# Patient Record
Sex: Female | Born: 1945 | Race: White | Hispanic: No | Marital: Married | Smoking: Former smoker
Health system: Southern US, Community
[De-identification: ages and names within clinical notes are randomized; demographics above are authoritative.]

## PROBLEM LIST (undated history)

## (undated) DIAGNOSIS — Z923 Personal history of irradiation: Secondary | ICD-10-CM

## (undated) DIAGNOSIS — R42 Dizziness and giddiness: Secondary | ICD-10-CM

## (undated) DIAGNOSIS — R55 Syncope and collapse: Secondary | ICD-10-CM

## (undated) DIAGNOSIS — Z9889 Other specified postprocedural states: Secondary | ICD-10-CM

## (undated) DIAGNOSIS — E785 Hyperlipidemia, unspecified: Secondary | ICD-10-CM

## (undated) DIAGNOSIS — I10 Essential (primary) hypertension: Secondary | ICD-10-CM

## (undated) DIAGNOSIS — M1990 Unspecified osteoarthritis, unspecified site: Secondary | ICD-10-CM

## (undated) DIAGNOSIS — C801 Malignant (primary) neoplasm, unspecified: Secondary | ICD-10-CM

## (undated) DIAGNOSIS — R112 Nausea with vomiting, unspecified: Secondary | ICD-10-CM

## (undated) DIAGNOSIS — K219 Gastro-esophageal reflux disease without esophagitis: Secondary | ICD-10-CM

## (undated) HISTORY — PX: TONSILLECTOMY: SUR1361

## (undated) HISTORY — PX: APPENDECTOMY: SHX54

## (undated) HISTORY — PX: IUD REMOVAL: SHX5392

## (undated) HISTORY — PX: DILATION AND CURETTAGE OF UTERUS: SHX78

## (undated) HISTORY — DX: Unspecified osteoarthritis, unspecified site: M19.90

## (undated) HISTORY — DX: Essential (primary) hypertension: I10

## (undated) HISTORY — PX: CYST REMOVAL NECK: SHX6281

## (undated) HISTORY — PX: INTRAUTERINE DEVICE INSERTION: SHX323

## (undated) HISTORY — DX: Hyperlipidemia, unspecified: E78.5

## (undated) HISTORY — DX: Gastro-esophageal reflux disease without esophagitis: K21.9

---

## 1997-10-10 ENCOUNTER — Encounter: Admission: RE | Admit: 1997-10-10 | Discharge: 1998-01-08 | Payer: Self-pay | Admitting: *Deleted

## 1998-03-18 HISTORY — PX: BUNIONECTOMY: SHX129

## 1998-11-06 ENCOUNTER — Ambulatory Visit (HOSPITAL_COMMUNITY): Admission: RE | Admit: 1998-11-06 | Discharge: 1998-11-06 | Payer: Self-pay | Admitting: *Deleted

## 1999-03-07 ENCOUNTER — Other Ambulatory Visit: Admission: RE | Admit: 1999-03-07 | Discharge: 1999-03-07 | Payer: Self-pay | Admitting: *Deleted

## 2000-08-20 ENCOUNTER — Other Ambulatory Visit: Admission: RE | Admit: 2000-08-20 | Discharge: 2000-08-20 | Payer: Self-pay | Admitting: *Deleted

## 2000-08-25 ENCOUNTER — Encounter: Admission: RE | Admit: 2000-08-25 | Discharge: 2000-08-25 | Payer: Self-pay | Admitting: *Deleted

## 2000-08-25 ENCOUNTER — Encounter: Payer: Self-pay | Admitting: *Deleted

## 2002-06-08 ENCOUNTER — Other Ambulatory Visit: Admission: RE | Admit: 2002-06-08 | Discharge: 2002-06-08 | Payer: Self-pay | Admitting: *Deleted

## 2003-06-13 ENCOUNTER — Other Ambulatory Visit: Admission: RE | Admit: 2003-06-13 | Discharge: 2003-06-13 | Payer: Self-pay | Admitting: Family Medicine

## 2004-06-25 ENCOUNTER — Other Ambulatory Visit: Admission: RE | Admit: 2004-06-25 | Discharge: 2004-06-25 | Payer: Self-pay | Admitting: Family Medicine

## 2006-01-09 ENCOUNTER — Other Ambulatory Visit: Admission: RE | Admit: 2006-01-09 | Discharge: 2006-01-09 | Payer: Self-pay | Admitting: Family Medicine

## 2006-05-05 ENCOUNTER — Ambulatory Visit: Payer: Self-pay | Admitting: Vascular Surgery

## 2007-12-28 ENCOUNTER — Other Ambulatory Visit: Admission: RE | Admit: 2007-12-28 | Discharge: 2007-12-28 | Payer: Self-pay | Admitting: Family Medicine

## 2008-03-22 ENCOUNTER — Ambulatory Visit: Payer: Self-pay | Admitting: Internal Medicine

## 2008-04-05 ENCOUNTER — Ambulatory Visit: Payer: Self-pay | Admitting: Internal Medicine

## 2009-03-18 HISTORY — PX: OTHER SURGICAL HISTORY: SHX169

## 2009-08-16 ENCOUNTER — Ambulatory Visit: Payer: Self-pay | Admitting: Vascular Surgery

## 2009-12-19 ENCOUNTER — Ambulatory Visit: Payer: Self-pay | Admitting: Vascular Surgery

## 2010-02-12 ENCOUNTER — Ambulatory Visit (HOSPITAL_COMMUNITY): Admission: RE | Admit: 2010-02-12 | Discharge: 2010-02-12 | Payer: Self-pay | Admitting: Vascular Surgery

## 2010-02-12 ENCOUNTER — Ambulatory Visit: Payer: Self-pay | Admitting: Vascular Surgery

## 2010-02-27 ENCOUNTER — Ambulatory Visit: Payer: Self-pay | Admitting: Vascular Surgery

## 2010-05-29 ENCOUNTER — Ambulatory Visit (INDEPENDENT_AMBULATORY_CARE_PROVIDER_SITE_OTHER): Payer: Medicare Other | Admitting: Vascular Surgery

## 2010-05-29 DIAGNOSIS — I83893 Varicose veins of bilateral lower extremities with other complications: Secondary | ICD-10-CM

## 2010-05-29 LAB — SURGICAL PCR SCREEN
MRSA, PCR: NEGATIVE
Staphylococcus aureus: NEGATIVE

## 2010-05-29 LAB — POCT I-STAT 4, (NA,K, GLUC, HGB,HCT)
Glucose, Bld: 103 mg/dL — ABNORMAL HIGH (ref 70–99)
HCT: 42 % (ref 36.0–46.0)
Hemoglobin: 14.3 g/dL (ref 12.0–15.0)
Potassium: 4.2 mEq/L (ref 3.5–5.1)
Sodium: 138 mEq/L (ref 135–145)

## 2010-05-29 NOTE — Assessment & Plan Note (Signed)
OFFICE VISIT  Christina, [name] DOB:  Jan 31, 1946                                       05/29/2010 CHART#:[id number]  Patient presents today for follow-up after a ligation and stripping of her right great saphenous vein from the groin to just below the knee on February 12, 2010.  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  She is quite pleased with her result.  She reports that she has had no pain and minimal swelling since the procedure.  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  She has had marked resolution of these.  These are still slightly enlarged but are not causing her any symptoms.  I explained that she would be a candidate for tributary phlebectomy of these if she is having symptoms but certainly would not recommend that with currently no discomfort.  She is very happy with her result.  We will see her again on an as-needed basis.    [name], M.D. Electronically Signed  TFE/MEDQ  D:  05/29/2010  T:  05/29/2010  Job:  [id number]

## 2010-07-31 ENCOUNTER — Other Ambulatory Visit (HOSPITAL_COMMUNITY)
Admission: RE | Admit: 2010-07-31 | Discharge: 2010-07-31 | Disposition: A | Discharge status: Home / Self Care | Payer: Medicare Other | Source: Ambulatory Visit | Attending: Family Medicine | Admitting: Family Medicine

## 2010-07-31 ENCOUNTER — Other Ambulatory Visit: Payer: Self-pay | Admitting: Family Medicine

## 2010-07-31 DIAGNOSIS — Z124 Encounter for screening for malignant neoplasm of cervix: Secondary | ICD-10-CM | POA: Insufficient documentation

## 2010-07-31 NOTE — Assessment & Plan Note (Signed)
OFFICE VISIT   [name], [name]  DOB:  1945-12-03                                       12/19/2009  CHART#:[id number]   Patient presents today for continued discussion regarding the right leg  venous hypertension.  She has a long history of this and has had  increasing severe discomfort related to this, especially with prolonged  standing at the end of the day.  She does not have any history of DVT.  She was fitted with compression garments in June and has worn these but  reports that she continues to have difficulty with walking.  She walks 3  miles a day for exercise and has had to stop due to leg pain and  decrease in her frequency and duration of walking.  She also has  difficulty with prolonged sitting, especially travel in a car due to leg  pain and also pain wakes her from her sleep at night.  She elevates her  legs when possible and also takes ibuprofen for discomfort.   She had a prior venous duplex showing that her saphenous vein had gross  reflux throughout its course and was enlarged up to 2 cm in diameter.  I  had recommended surgical stripping of her great saphenous vein on the  right.  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  I did explain to her that Medicare does not cover stab  phlebectomy at the same setting as surgical stripping of the great  saphenous vein.  Therefore, we will proceed with this.  We will then  follow her to determine if she has had adequate symptom or would require  a staged phlebectomy.  We will schedule this at her earliest convenience  at [facility].     [name], M.D.  Electronically Signed   TFE/MEDQ  D:  12/19/2009  T:  12/20/2009  Job:  [id number]

## 2010-07-31 NOTE — Assessment & Plan Note (Signed)
OFFICE VISIT   [name], [name]  DOB:  09-Mar-1946                                       02/27/2010  CHART#:[id number]   Here today for follow-up of ligation and stripping of her right great  saphenous vein from her groin to just below the knee on 02/12/2010.  She  had an aneurysmal saphenous vein, making laser ablation in the office  not possible.  She has had an excellent early result with mild bruising  and minimal discomfort.  She reports that she is having less distention  feeling and less swelling in her right calf than before.   She continues to have tributaries varicosities below this level.  These  appear to be somewhat less pressurized than before.  She will wear her  compression garments should these cause difficulty, and we will see her  again in 3 months for final follow-up.   She does report that she is having worsening right shoulder symptoms.  She reports this is stiff and painful the morning when she first awakens  and is relieved with activity.  I explained that this sounds quite  typical for arthritis.  She is encouraged to take ibuprofen for this  discomfort and will notify her medical doctor should this persist.     [name], M.D.  Electronically Signed   TFE/MEDQ  D:  02/27/2010  T:  02/28/2010  Job:  [id number]   cc:   Gretta Arab. Valentina Lucks, M.D.

## 2010-07-31 NOTE — Consult Note (Signed)
NEW PATIENT CONSULTATION   [name], DESROSIERS  DOB:  Dec 14, 1945                                       08/16/2009  CHART#:[id number]   The patient presents today for continued evaluation of venous  hypertension in her right leg.  I know the patient from prior evaluation  in late 2007 and early 2008.  She is a very pleasant 65 year old white  female with progressive changes of venous hypertension in her right leg.  She reports that she is having increasingly severe discomfort, this is  most particularly pronounced with prolonged standing and she is also  having markedly increased swelling in her right calf and ankle.  She  reports that this is worse throughout the course of the day.  She does  not have any history of prior DVT and no history of bleeding.  She has  worn support hose in the past and she has had difficulty tolerating  compression garments in the past.   Her past history is otherwise unchanged.  She does have a history of  tonsillectomy, D and C, bunion surgery and two births for surgical.   Medical history is significant for hypertension, elevated cholesterol.  She does not have any cardiac disease.   FAMILY HISTORY:  Significant for phlebitis in her father.  Otherwise no  premature atherosclerotic disease.   SOCIAL HISTORY:  She is married with two children.  She is a retired  Engineer, site.  She does not smoke and has occasional social alcohol  consumption on the weekends.   REVIEW OF SYSTEMS:  Her weight is stable at 180 pounds.  She is 5 feet 5  inches tall.  CARDIAC:  Negative.  PULMONARY:  Negative.  GI:  Positive reflux.  GU:  Negative.  VASCULAR:  Positive for phlebitis and aching with prolonged standing.  Neurologic, musculoskeletal, psychiatric, ENT, hematologic and skin are  negative.   PHYSICAL EXAM:  Well-developed, well-nourished white female appearing  stated age of 36.  Her blood pressure is 128/72, heart rate is 84,  respirations are 18.  She is in no acute distress.  HEENT is normal.  Musculoskeletal shows no major deformities or cyanosis.  Neurologic no  focal weakness or paresthesias.  Skin no ulcers or rashes.  She does  have 2+ radial and 2+ dorsalis pedis pulses bilaterally.  Her left leg  shows no venous varicosities.  Right leg shows marked varicosities from  the pretibial area at the level of her knee extending over her calf down  towards her ankle.  She does have changes of chronic venous hypertension  around the level of her ankle with some hemosiderin deposit and bluish  discoloration and swelling versus her left leg.   She underwent repeat noninvasive vascular laboratory studies in our  office today and this shows incompetence throughout her great saphenous  vein on the right.  She has no evidence of reflux in her deep system.  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   I had a long discussion with the patient regarding her venous pathology.  I explained that due to the normal deep system that she should have  marked improvement in her venous hypertension with treatment of her  saphenous vein.  She has not worn compression garments recently and  therefore we have  fitted her and she has a prescription for 20-30 mmHg  graduated compression garments.  We plan to see her again in 3 months to  determine if this is helping her symptoms.  She does understand that she  is not a candidate for laser ablation in our office due to the large  caliber of her saphenous vein and she would require a vein stripping.  She understands this as an outpatient procedure at [facility]  She could also undergo a phlebectomy of the tributary varicosities in  the same setting.  We will continue this discussion with her and see her  again in 3 months for continued discussion.     [name], M.D.  Electronically Signed   TFE/MEDQ  D:  08/16/2009  T:   08/17/2009  Job:  [id number]   cc:   Gretta Arab. Valentina Lucks, M.D.  Amy Y. [place], M.D.

## 2010-07-31 NOTE — Procedures (Signed)
LOWER EXTREMITY VENOUS REFLUX EXAM   INDICATION:  Bilateral painful varicose veins, venous hypertension.   EXAM:  Using color-flow imaging and pulse Doppler spectral analysis, the  right common femoral, superficial femoral, popliteal, posterior tibial,  greater and lesser saphenous veins are evaluated.  There is no evidence  suggesting deep venous insufficiency in the right lower extremity.   The right saphenofemoral junction is not competent with reflux of >500  milliseconds. The right GSV is not competent with reflux of >500  milliseconds with the caliber as described below.)   The right proximal short saphenous vein demonstrates competency.   GSV Diameter (used if found to be incompetent only)                                            Right    Left  Proximal Greater Saphenous Vein           2.1 cm   cm  Proximal-to-mid-thigh                     1.3 cm   cm  Mid thigh                                 0.9 cm   cm  Mid-distal thigh                          0.6 cm   cm  Distal thigh                              0.9 cm   cm  Knee                                      0.6 cm   cm   IMPRESSION:  1. The right greater saphenous vein reflux with >500 milliseconds is      identified with the caliber ranging from 2.1 cm to 0.6 cm knee to      groin.  2. The right greater saphenous vein is ectatic.  3. The right greater saphenous vein is not tortuous.  4. The deep venous system is competent.  5. The right lesser saphenous vein is competent.        ___________________________________________  [name], M.D.   NT/MEDQ  D:  08/16/2009  T:  08/16/2009  Job:  [id number]

## 2011-06-26 ENCOUNTER — Other Ambulatory Visit: Payer: Self-pay | Admitting: Radiology

## 2011-07-19 ENCOUNTER — Ambulatory Visit (INDEPENDENT_AMBULATORY_CARE_PROVIDER_SITE_OTHER): Payer: Medicare Other | Admitting: Surgery

## 2011-07-19 ENCOUNTER — Encounter (INDEPENDENT_AMBULATORY_CARE_PROVIDER_SITE_OTHER): Payer: Self-pay | Admitting: Surgery

## 2011-07-19 VITALS — BP 102/78 | HR 84 | Temp 98.2°F | Resp 14 | Ht 65.0 in | Wt 185.4 lb

## 2011-07-19 DIAGNOSIS — D249 Benign neoplasm of unspecified breast: Secondary | ICD-10-CM

## 2011-07-19 DIAGNOSIS — D241 Benign neoplasm of right breast: Secondary | ICD-10-CM

## 2011-07-19 NOTE — Progress Notes (Signed)
Patient ID: [name], female   DOB: [birth year], 66 y.o.   MRN: [id number]  Chief Complaint  Patient presents with  . PT Initial Evaluation    New pt    HPI [name] is a 66 y.o. female.   HPIPatient seen at the request of Dr. Yolanda Bonine fot right breast mammographic abnormality. She underwent core biopsy which showed papilloma with atypia. She denies any history of breast mass, discharge or breast pain bilaterally. She has no family history of breast cancer or breast related diseases.  Past Medical History  Diagnosis Date  . Arthritis   . GERD (gastroesophageal reflux disease)   . Hyperlipidemia   . Hypertension     Past Surgical History  Procedure Date  . Bunionectomy 2000  . Removal right straphenous vein 2011    Family History  Problem Relation Age of Onset  . Cancer Paternal Aunt     Breast    Social History History  Substance Use Topics  . Smoking status: Never Smoker   . Smokeless tobacco: Not on file  . Alcohol Use: Yes     Socially    Not on File  Current Outpatient Prescriptions  Medication Sig Dispense Refill  . atorvastatin (LIPITOR) 40 MG tablet       . MICARDIS HCT 40-12.5 MG per tablet         Review of Systems Review of Systems  Constitutional: Negative for fever, chills and unexpected weight change.  HENT: Negative for hearing loss, congestion, sore throat, trouble swallowing and voice change.   Eyes: Negative for visual disturbance.  Respiratory: Negative for cough and wheezing.   Cardiovascular: Negative for chest pain, palpitations and leg swelling.  Gastrointestinal: Negative for nausea, vomiting, abdominal pain, diarrhea, constipation, blood in stool, abdominal distention and anal bleeding.  Genitourinary: Negative for hematuria, vaginal bleeding and difficulty urinating.  Musculoskeletal: Negative for arthralgias.  Skin: Negative for rash and wound.  Neurological: Negative for seizures, syncope and headaches.  Hematological:  Negative for adenopathy. Does not bruise/bleed easily.  Psychiatric/Behavioral: Negative for confusion.    Blood pressure 102/78, pulse 84, temperature 98.2 F (36.8 C), temperature source Temporal, resp. rate 14, height 5\' 5"  (1.651 m), weight 185 lb 6.4 oz (84.097 kg).  Physical Exam Physical Exam  Vitals reviewed. Constitutional: She appears well-developed and well-nourished.  HENT:  Head: Normocephalic and atraumatic.  Eyes: EOM are normal. Pupils are equal, round, and reactive to light.  Neck: Normal range of motion. Neck supple.  Cardiovascular: Normal rate and regular rhythm.   Pulmonary/Chest: Effort normal and breath sounds normal.       Postbiopsy changes to right breast noted. No mass. Right axilla and right nipple were normal. Left breast normal. Left axilla and left nipple normal.  Abdominal: Soft. Bowel sounds are normal.  Musculoskeletal: Normal range of motion.  Neurological: She is alert. She has normal reflexes.  Skin: Skin is warm and dry.  Psychiatric: She has a normal mood and affect. Her behavior is normal. Judgment and thought content normal.    Data Reviewed Mammogram [facility]   Right breast atypical tissue lateral central portion core biopsy shows papilloma with atypia  Assessment    Right breast papilloma with atypia    Plan    Right breast needle localized lumpectomy.The procedure has been discussed with the patient. Alternatives to surgery have been discussed with the patient.  Risks of surgery include bleeding,  Infection,  Seroma formation, death,  and the need for further surgery.  The patient understands and wishes to proceed.Alternative to surgery discussed as well as risks of that.  She understands and agrees to proceed.       Mareesa Gathright A. 07/19/2011, 9:58 AM

## 2011-07-19 NOTE — Patient Instructions (Signed)
Lumpectomy, Breast Conserving Surgery A lumpectomy is breast surgery that removes only part of the breast. Another name used may be partial mastectomy. The amount removed varies. Make sure you understand how much of your breast will be removed. Reasons for a lumpectomy:  Any solid breast mass.   Grouped significant nodularity that may be confused with a solitary breast mass.  Lumpectomy is the most common form of breast cancer surgery today. The surgeon removes the portion of your breast which contains the tumor (cancer). This is the lump. Some normal tissue around the lump is also removed to be sure that all the tumor has been removed.  If cancer cells are found in the margins where the breast tissue was removed, your surgeon will do more surgery to remove the remaining cancer tissue. This is called re-excision surgery. Radiation and/or chemotherapy treatments are often given following a lumpectomy to kill any cancer cells that could possibly remain.  REASONS YOU MAY NOT BE ABLE TO HAVE BREAST CONSERVING SURGERY:  The tumor is located in more than one place.   Your breast is small and the tumor is large so the breast would be disfigured.   The entire tumor removal is not successful with a lumpectomy.   You cannot commit to a full course of chemotherapy, radiation therapy or are pregnant and cannot have radiation.   You have previously had radiation to the breast to treat cancer.  HOW A LUMPECTOMY IS PERFORMED If overnight nursing is not required following a biopsy, a lumpectomy can be performed as a same-day surgery. This can be done in a hospital, clinic, or surgical center. The anesthesia used will depend on your surgeon. They will discuss this with you. A general anesthetic keeps you sleeping through the procedure. LET YOUR CAREGIVERS KNOW ABOUT THE FOLLOWING:  Allergies   Medications taken including herbs, eye drops, over the counter medications, and creams.   Use of steroids (by  mouth or creams)   Previous problems with anesthetics or Novocaine.   Possibility of pregnancy, if this applies   History of blood clots (thrombophlebitis)   History of bleeding or blood problems.   Previous surgery   Other health problems  BEFORE THE PROCEDURE You should be present one hour prior to your procedure unless directed otherwise.  AFTER THE PROCEDURE  After surgery, you will be taken to the recovery area where a nurse will watch and check your progress. Once you're awake, stable, and taking fluids well, barring other problems you will be allowed to go home.   Ice packs applied to your operative site may help with discomfort and keep the swelling down.   A small rubber drain may be placed in the breast for a couple of days to prevent a hematoma from developing in the breast.   A pressure dressing may be applied for 24 to 48 hours to prevent bleeding.   Keep the wound dry.   You may resume a normal diet and activities as directed. Avoid strenuous activities affecting the arm on the side of the biopsy site such as tennis, swimming, heavy lifting (more than 10 pounds) or pulling.   Bruising in the breast is normal following this procedure.   Wearing a bra - even to bed - may be more comfortable and also help keep the dressing on.   Change dressings as directed.   Only take over-the-counter or prescription medicines for pain, discomfort, or fever as directed by your caregiver.  Call for your results as   instructed by your surgeon. Remember it is your responsibility to get the results of your lumpectomy if your surgeon asked you to follow-up. Do not assume everything is fine if you have not heard from your caregiver. SEEK MEDICAL CARE IF:   There is increased bleeding (more than a small spot) from the wound.   You notice redness, swelling, or increasing pain in the wound.   Pus is coming from wound.   An unexplained oral temperature above 102 F (38.9 C) develops.     You notice a foul smell coming from the wound or dressing.  SEEK IMMEDIATE MEDICAL CARE IF:   You develop a rash.   You have difficulty breathing.   You have any allergic problems.  Document Released: 04/15/2006 Document Revised: 02/21/2011 Document Reviewed: 07/17/2006 Exit[employer] Patient Information 2012 [employer]. 

## 2011-08-20 ENCOUNTER — Encounter (HOSPITAL_BASED_OUTPATIENT_CLINIC_OR_DEPARTMENT_OTHER): Payer: Self-pay | Admitting: *Deleted

## 2011-08-20 NOTE — Progress Notes (Signed)
To come in 6/11 for all labs,cxr,ekg

## 2011-08-26 ENCOUNTER — Encounter (HOSPITAL_BASED_OUTPATIENT_CLINIC_OR_DEPARTMENT_OTHER)
Admission: RE | Admit: 2011-08-26 | Discharge: 2011-08-26 | Disposition: A | Discharge status: Home / Self Care | Payer: Medicare Other | Source: Ambulatory Visit

## 2011-08-26 ENCOUNTER — Ambulatory Visit
Admission: RE | Admit: 2011-08-26 | Discharge: 2011-08-26 | Disposition: A | Discharge status: Home / Self Care | Payer: Medicare Other | Source: Ambulatory Visit | Attending: Surgery | Admitting: Surgery

## 2011-08-26 LAB — DIFFERENTIAL
Lymphocytes Relative: 24 % (ref 12–46)
Monocytes Absolute: 0.6 10*3/uL (ref 0.1–1.0)
Monocytes Relative: 7 % (ref 3–12)
Neutro Abs: 5.3 10*3/uL (ref 1.7–7.7)
Neutrophils Relative %: 67 % (ref 43–77)

## 2011-08-26 LAB — CBC
MCH: 31.6 pg (ref 26.0–34.0)
MCHC: 33.3 g/dL (ref 30.0–36.0)
MCV: 94.9 fL (ref 78.0–100.0)
Platelets: 197 10*3/uL (ref 150–400)
RDW: 13 % (ref 11.5–15.5)
WBC: 7.9 10*3/uL (ref 4.0–10.5)

## 2011-08-26 LAB — COMPREHENSIVE METABOLIC PANEL
AST: 23 U/L (ref 0–37)
Albumin: 4.1 g/dL (ref 3.5–5.2)
BUN: 15 mg/dL (ref 6–23)
Calcium: 9.5 mg/dL (ref 8.4–10.5)
Creatinine, Ser: 0.68 mg/dL (ref 0.50–1.10)
Total Protein: 7.3 g/dL (ref 6.0–8.3)

## 2011-08-27 ENCOUNTER — Encounter (HOSPITAL_BASED_OUTPATIENT_CLINIC_OR_DEPARTMENT_OTHER): Payer: Self-pay | Admitting: Certified Registered"

## 2011-08-27 ENCOUNTER — Encounter (HOSPITAL_BASED_OUTPATIENT_CLINIC_OR_DEPARTMENT_OTHER): Payer: Self-pay | Admitting: Surgery

## 2011-08-27 ENCOUNTER — Other Ambulatory Visit (INDEPENDENT_AMBULATORY_CARE_PROVIDER_SITE_OTHER): Payer: Self-pay

## 2011-08-27 ENCOUNTER — Ambulatory Visit (HOSPITAL_BASED_OUTPATIENT_CLINIC_OR_DEPARTMENT_OTHER)
Admission: RE | Admit: 2011-08-27 | Discharge: 2011-08-27 | Disposition: A | Discharge status: Home / Self Care | Payer: Medicare Other | Source: Ambulatory Visit | Attending: Surgery | Admitting: Surgery

## 2011-08-27 ENCOUNTER — Encounter (HOSPITAL_BASED_OUTPATIENT_CLINIC_OR_DEPARTMENT_OTHER)
Admission: RE | Disposition: A | Discharge status: Home / Self Care | Payer: Self-pay | Source: Ambulatory Visit | Attending: Surgery

## 2011-08-27 ENCOUNTER — Ambulatory Visit (HOSPITAL_BASED_OUTPATIENT_CLINIC_OR_DEPARTMENT_OTHER): Payer: Medicare Other | Admitting: Certified Registered"

## 2011-08-27 DIAGNOSIS — D249 Benign neoplasm of unspecified breast: Secondary | ICD-10-CM

## 2011-08-27 DIAGNOSIS — E785 Hyperlipidemia, unspecified: Secondary | ICD-10-CM | POA: Insufficient documentation

## 2011-08-27 DIAGNOSIS — I10 Essential (primary) hypertension: Secondary | ICD-10-CM | POA: Insufficient documentation

## 2011-08-27 DIAGNOSIS — Z01812 Encounter for preprocedural laboratory examination: Secondary | ICD-10-CM | POA: Insufficient documentation

## 2011-08-27 DIAGNOSIS — K219 Gastro-esophageal reflux disease without esophagitis: Secondary | ICD-10-CM | POA: Insufficient documentation

## 2011-08-27 DIAGNOSIS — Z9889 Other specified postprocedural states: Secondary | ICD-10-CM

## 2011-08-27 DIAGNOSIS — M129 Arthropathy, unspecified: Secondary | ICD-10-CM | POA: Insufficient documentation

## 2011-08-27 DIAGNOSIS — Z01810 Encounter for preprocedural cardiovascular examination: Secondary | ICD-10-CM | POA: Insufficient documentation

## 2011-08-27 HISTORY — PX: BREAST SURGERY: SHX581

## 2011-08-27 HISTORY — DX: Other specified postprocedural states: Z98.890

## 2011-08-27 HISTORY — DX: Nausea with vomiting, unspecified: R11.2

## 2011-08-27 SURGERY — BREAST LUMPECTOMY WITH NEEDLE LOCALIZATION
Anesthesia: General | Site: Breast | Laterality: Right | Wound class: Clean

## 2011-08-27 MED ORDER — HYDROCODONE-ACETAMINOPHEN 5-325 MG PO TABS: 1.0000 | ORAL_TABLET | Freq: Four times a day (QID) | ORAL | Status: AC | PRN

## 2011-08-27 MED ORDER — EPHEDRINE SULFATE 50 MG/ML IJ SOLN
INTRAMUSCULAR | Status: DC | PRN
Start: 2011-08-27 — End: 2011-08-27
  Administered 2011-08-27: 10 mg via INTRAVENOUS

## 2011-08-27 MED ORDER — PROPOFOL 10 MG/ML IV EMUL
INTRAVENOUS | Status: DC | PRN
  Administered 2011-08-27: 150 mg via INTRAVENOUS

## 2011-08-27 MED ORDER — DEXAMETHASONE SODIUM PHOSPHATE 4 MG/ML IJ SOLN
INTRAMUSCULAR | Status: DC | PRN
  Administered 2011-08-27: 10 mg via INTRAVENOUS

## 2011-08-27 MED ORDER — HYDROMORPHONE HCL PF 1 MG/ML IJ SOLN: 0.2500 mg | INTRAMUSCULAR | Status: DC | PRN

## 2011-08-27 MED ORDER — LIDOCAINE HCL (CARDIAC) 20 MG/ML IV SOLN
INTRAVENOUS | Status: DC | PRN
  Administered 2011-08-27: 100 mg via INTRAVENOUS

## 2011-08-27 MED ORDER — ONDANSETRON HCL 4 MG/2ML IJ SOLN: 4.0000 mg | Freq: Once | INTRAMUSCULAR | Status: DC | PRN

## 2011-08-27 MED ORDER — CEFAZOLIN SODIUM-DEXTROSE 2-3 GM-% IV SOLR
2.0000 g | INTRAVENOUS | Status: AC
  Administered 2011-08-27: 2 g via INTRAVENOUS

## 2011-08-27 MED ORDER — ACETAMINOPHEN 10 MG/ML IV SOLN
INTRAVENOUS | Status: DC | PRN
  Administered 2011-08-27: 1000 mg via INTRAVENOUS

## 2011-08-27 MED ORDER — CHLORHEXIDINE GLUCONATE 4 % EX LIQD: 1.0000 "application " | Freq: Once | CUTANEOUS | Status: DC

## 2011-08-27 MED ORDER — BUPIVACAINE-EPINEPHRINE 0.25% -1:200000 IJ SOLN
INTRAMUSCULAR | Status: DC | PRN
  Administered 2011-08-27: 20 mL

## 2011-08-27 MED ORDER — LACTATED RINGERS IV SOLN
INTRAVENOUS | Status: DC
  Administered 2011-08-27 (×2): via INTRAVENOUS

## 2011-08-27 MED ORDER — ONDANSETRON HCL 4 MG/2ML IJ SOLN
INTRAMUSCULAR | Status: DC | PRN
  Administered 2011-08-27 (×2): 2 mg via INTRAVENOUS

## 2011-08-27 MED ORDER — DROPERIDOL 2.5 MG/ML IJ SOLN
INTRAMUSCULAR | Status: DC | PRN
  Administered 2011-08-27: 0.625 mg via INTRAVENOUS

## 2011-08-27 MED ORDER — FENTANYL CITRATE 0.05 MG/ML IJ SOLN
INTRAMUSCULAR | Status: DC | PRN
  Administered 2011-08-27: 100 ug via INTRAVENOUS

## 2011-08-27 MED ORDER — MIDAZOLAM HCL 5 MG/5ML IJ SOLN
INTRAMUSCULAR | Status: DC | PRN
  Administered 2011-08-27: 1 mg via INTRAVENOUS

## 2011-08-27 SURGICAL SUPPLY — 42 items
ADH SKN CLS APL DERMABOND .7 (GAUZE/BANDAGES/DRESSINGS) ×1
BLADE SURG 15 STRL LF DISP TIS (BLADE) ×1 IMPLANT
BLADE SURG 15 STRL SS (BLADE) ×1
CANISTER SUCTION 1200CC (MISCELLANEOUS) ×2 IMPLANT
CHLORAPREP W/TINT 26ML (MISCELLANEOUS) ×2 IMPLANT
CLIP TI WIDE RED SMALL 6 (CLIP) IMPLANT
CLOTH BEACON ORANGE TIMEOUT ST (SAFETY) ×2 IMPLANT
COVER MAYO STAND STRL (DRAPES) ×2 IMPLANT
COVER TABLE BACK 60X90 (DRAPES) ×2 IMPLANT
DECANTER SPIKE VIAL GLASS SM (MISCELLANEOUS) ×2 IMPLANT
DERMABOND ADVANCED (GAUZE/BANDAGES/DRESSINGS) ×1
DERMABOND ADVANCED .7 DNX12 (GAUZE/BANDAGES/DRESSINGS) ×1 IMPLANT
DEVICE DUBIN W/COMP PLATE 8390 (MISCELLANEOUS) ×4 IMPLANT
DRAPE LAPAROSCOPIC ABDOMINAL (DRAPES) IMPLANT
DRAPE PED LAPAROTOMY (DRAPES) ×2 IMPLANT
DRAPE UTILITY XL STRL (DRAPES) ×2 IMPLANT
ELECT COATED BLADE 2.86 ST (ELECTRODE) ×2 IMPLANT
ELECT REM PT RETURN 9FT ADLT (ELECTROSURGICAL) ×2
ELECTRODE REM PT RTRN 9FT ADLT (ELECTROSURGICAL) ×1 IMPLANT
GLOVE BIO SURGEON STRL SZ7.5 (GLOVE) ×2 IMPLANT
GLOVE BIOGEL PI IND STRL 8 (GLOVE) ×1 IMPLANT
GLOVE BIOGEL PI INDICATOR 8 (GLOVE) ×1
GLOVE ECLIPSE 6.5 STRL STRAW (GLOVE) ×2 IMPLANT
GLOVE ECLIPSE 8.0 STRL XLNG CF (GLOVE) ×2 IMPLANT
GOWN PREVENTION PLUS XLARGE (GOWN DISPOSABLE) ×2 IMPLANT
KIT MARKER MARGIN INK (KITS) IMPLANT
NEEDLE HYPO 25X1 1.5 SAFETY (NEEDLE) ×2 IMPLANT
NS IRRIG 1000ML POUR BTL (IV SOLUTION) ×2 IMPLANT
PACK BASIN DAY SURGERY FS (CUSTOM PROCEDURE TRAY) ×2 IMPLANT
PENCIL BUTTON HOLSTER BLD 10FT (ELECTRODE) ×2 IMPLANT
SLEEVE SCD COMPRESS KNEE MED (MISCELLANEOUS) ×2 IMPLANT
SPONGE LAP 4X18 X RAY DECT (DISPOSABLE) ×2 IMPLANT
SUT MON AB 4-0 PC3 18 (SUTURE) ×2 IMPLANT
SUT SILK 2 0 SH (SUTURE) IMPLANT
SUT VIC AB 3-0 SH 27 (SUTURE) ×2
SUT VIC AB 3-0 SH 27X BRD (SUTURE) ×1 IMPLANT
SYR CONTROL 10ML LL (SYRINGE) ×2 IMPLANT
TOWEL OR 17X24 6PK STRL BLUE (TOWEL DISPOSABLE) ×4 IMPLANT
TOWEL OR NON WOVEN STRL DISP B (DISPOSABLE) ×2 IMPLANT
TUBE CONNECTING 20X1/4 (TUBING) ×2 IMPLANT
WATER STERILE IRR 1000ML POUR (IV SOLUTION) IMPLANT
YANKAUER SUCT BULB TIP NO VENT (SUCTIONS) ×2 IMPLANT

## 2011-08-27 NOTE — Transfer of Care (Signed)
Immediate Anesthesia Transfer of Care Note  Patient: [name]  Procedure(s) Performed: Procedure(s) (LRB): BREAST LUMPECTOMY WITH NEEDLE LOCALIZATION (Right)  Patient Location: PACU  Anesthesia Type: General  Level of Consciousness: sedated  Airway & Oxygen Therapy: Patient Spontanous Breathing and Patient connected to face mask oxygen  Post-op Assessment: Report given to PACU RN and Post -op Vital signs reviewed and stable  Post vital signs: Reviewed and stable  Complications: No apparent anesthesia complications

## 2011-08-27 NOTE — Anesthesia Preprocedure Evaluation (Signed)
Anesthesia Evaluation  Patient identified by MRN, date of birth, ID band Patient awake    Reviewed: Allergy & Precautions, H&P , NPO status , Patient's Chart, lab work & pertinent test results  History of Anesthesia Complications (+) PONV  Airway Mallampati: I TM Distance: >3 FB Neck ROM: Full    Dental   Pulmonary          Cardiovascular hypertension, Pt. on medications     Neuro/Psych    GI/Hepatic GERD-  Medicated and Controlled,  Endo/Other    Renal/GU      Musculoskeletal   Abdominal   Peds  Hematology   Anesthesia Other Findings   Reproductive/Obstetrics                           Anesthesia Physical Anesthesia Plan  ASA: II  Anesthesia Plan: General   Post-op Pain Management:    Induction: Intravenous  Airway Management Planned: LMA  Additional Equipment:   Intra-op Plan:   Post-operative Plan: Extubation in OR  Informed Consent: I have reviewed the patients History and Physical, chart, labs and discussed the procedure including the risks, benefits and alternatives for the proposed anesthesia with the patient or authorized representative who has indicated his/her understanding and acceptance.     Plan Discussed with: CRNA and Surgeon  Anesthesia Plan Comments:         Anesthesia Quick Evaluation  

## 2011-08-27 NOTE — Anesthesia Procedure Notes (Signed)
Procedure Name: LMA Insertion Performed by: Lance Coon Pre-anesthesia Checklist: Patient identified, Timeout performed, Emergency Drugs available, Suction available and Patient being monitored Patient Re-evaluated:Patient Re-evaluated prior to inductionOxygen Delivery Method: Circle system utilized Preoxygenation: Pre-oxygenation with 100% oxygen Intubation Type: IV induction Ventilation: Mask ventilation without difficulty LMA: LMA inserted LMA Size: 4.0 Number of attempts: 1 Placement Confirmation: positive ETCO2 and breath sounds checked- equal and bilateral Dental Injury: Teeth and Oropharynx as per pre-operative assessment

## 2011-08-27 NOTE — Anesthesia Postprocedure Evaluation (Signed)
Anesthesia Post Note  Patient: [name]  Procedure(s) Performed: Procedure(s) (LRB): BREAST LUMPECTOMY WITH NEEDLE LOCALIZATION (Right)  Anesthesia type: General  Patient location: PACU  Post pain: Pain level controlled  Post assessment: Patient's Cardiovascular Status Stable  Last Vitals:  Filed Vitals:   08/27/11 1151  BP: 116/54  Pulse: 87  Temp: 36.7 C  Resp: 16    Post vital signs: Reviewed and stable  Level of consciousness: alert  Complications: No apparent anesthesia complications

## 2011-08-27 NOTE — Op Note (Signed)
Right Breast Lumpectomy with needle localization  Indications: This patient presents with history of a right sclerosing  breast mass. Given the clinical history and physical exam, along with indicated diagnostic studies, breast biopsy will be performed.  Pre-operative Diagnosis: right breast mass sclerosing lesion  Post-operative Diagnosis: right breast mass sclerosing lesion  Surgeon: Harriette Bouillon A.   Assistants: OR  Anesthesia: General LMA anesthesia and Local anesthesia 0.25.% bupivacaine, with epinephrine  ASA Class: 2  Procedure Details  The patient was seen in the Holding Room. The patient underwent needle localization at [facility]. The risks, benefits, complications, treatment options, and expected outcomes were discussed with the patient. The possibilities of reaction to medication, pulmonary aspiration, bleeding, infection, the need for additional procedures, failure to diagnose a condition, and creating a complication requiring transfusion or operation were discussed with the patient. The patient concurred with the proposed plan, giving informed consent. The site of surgery properly noted/marked. The patient was taken to Operating Room, identified as [name], and the procedure verified as lumpectomy. A Time Out was held and the above information confirmed.  After induction of anesthesia, the right breast and chest were prepped and draped in standard fashion. The lumpectomy was performed by creating an oblique incision over the lower outer quadrant of the breast adjacent to the localizing wire. . Hemostasis was achieved with cautery.  Additional tissue was taken along the medial, lateral, posterior, anterior, inferior and superior margin and submitted separately to pathology after providing orientation for the pathology.The clip and wire radiographs were submitted but the wire and clip were separated.  It appeared the clip was in the separate specimen.  Wide excision of all margins  was done.  She will get a post operative mammogram once she heals in follow up. The wound was irrigated and closed with a 3-0 Vicryl subcuticular and 4 0 monocryl  closure in layers.  Specimen oriented.    Sterile dressings were applied. At the end of the operation, all sponge, instrument, and needle counts were correct.  Findings: grossly clear surgical margins  Estimated Blood Loss:  less than 50 mL         Drains: none         Total IV Fluids: 500 mL         Specimens: breast mass  And margins           Complications:  None; patient tolerated the procedure well.         Disposition: PACU - hemodynamically stable.         Condition: Stable

## 2011-08-27 NOTE — H&P (Signed)
[name]    MRN: [id number]   Description: 66 year old female  Provider: Dortha Schwalbe., MD  Department: [facility]        Diagnoses     Papilloma of breast, right   - Primary    217      Reason for Visit     PT Initial Evaluation    New pt        Vitals - Last Recorded       BP Pulse Temp(Src) Resp Ht Wt    102/78  84  98.2 F (36.8 C) (Temporal)  14  5\' 5"  (1.651 m)  185 lb 6.4 oz (84.097 kg)          BMI              30.85 kg/m2                 Progress Notes      Patient ID: [name], female   DOB: Feb 11, 1946, 66 y.o.   MRN: [id number]    Chief Complaint   Patient presents with   .  PT Initial Evaluation       New pt      HPI [name] is a 66 y.o. female.   HPIPatient seen at the request of Dr. Yolanda Bonine fot right breast mammographic abnormality. She underwent core biopsy which showed papilloma with atypia. She denies any history of breast mass, discharge or breast pain bilaterally. She has no family history of breast cancer or breast related diseases.    Past Medical History   Diagnosis  Date   .  Arthritis     .  GERD (gastroesophageal reflux disease)     .  Hyperlipidemia     .  Hypertension         Past Surgical History   Procedure  Date   .  Bunionectomy  2000   .  Removal right straphenous vein  2011       Family History   Problem  Relation  Age of Onset   .  Cancer  Paternal Aunt         Breast      Social History History   Substance Use Topics   .  Smoking status:  Never Smoker    .  Smokeless tobacco:  Not on file   .  Alcohol Use:  Yes         Socially      Not on File    Current Outpatient Prescriptions   Medication  Sig  Dispense  Refill   .  atorvastatin (LIPITOR) 40 MG tablet           .  MICARDIS HCT 40-12.5 MG per tablet              Review of Systems Review of Systems  Constitutional: Negative for fever, chills and unexpected weight change.  HENT: Negative for hearing loss,  congestion, sore throat, trouble swallowing and voice change.   Eyes: Negative for visual disturbance.  Respiratory: Negative for cough and wheezing.   Cardiovascular: Negative for chest pain, palpitations and leg swelling.  Gastrointestinal: Negative for nausea, vomiting, abdominal pain, diarrhea, constipation, blood in stool, abdominal distention and anal bleeding.  Genitourinary: Negative for hematuria, vaginal bleeding and difficulty urinating.  Musculoskeletal: Negative for arthralgias.  Skin: Negative for rash and wound.  Neurological: Negative for seizures, syncope and headaches.  Hematological: Negative for adenopathy. Does not  bruise/bleed easily.  Psychiatric/Behavioral: Negative for confusion.    Blood pressure 102/78, pulse 84, temperature 98.2 F (36.8 C), temperature source Temporal, resp. rate 14, height 5\' 5"  (1.651 m), weight 185 lb 6.4 oz (84.097 kg).   Physical Exam Physical Exam  Vitals reviewed. Constitutional: She appears well-developed and well-nourished.  HENT:   Head: Normocephalic and atraumatic.  Eyes: EOM are normal. Pupils are equal, round, and reactive to light.  Neck: Normal range of motion. Neck supple.  Cardiovascular: Normal rate and regular rhythm.   Pulmonary/Chest: Effort normal and breath sounds normal.       Postbiopsy changes to right breast noted. No mass. Right axilla and right nipple were normal. Left breast normal. Left axilla and left nipple normal.  Abdominal: Soft. Bowel sounds are normal.  Musculoskeletal: Normal range of motion.  Neurological: She is alert. She has normal reflexes.  Skin: Skin is warm and dry.  Psychiatric: She has a normal mood and affect. Her behavior is normal. Judgment and thought content normal.    Data Reviewed Mammogram [facility]   Right breast atypical tissue lateral central portion core biopsy shows papilloma with atypia   Assessment Right breast papilloma with atypia   Plan Right breast needle  localized lumpectomy.The procedure has been discussed with the patient. Alternatives to surgery have been discussed with the patient.  Risks of surgery include bleeding,  Infection,  Seroma formation, death,  and the need for further surgery.   The patient understands and wishes to proceed.Alternative to surgery discussed as well as risks of that.  She understands and agrees to proceed.       Evian Salguero A.  08/27/2011

## 2011-08-27 NOTE — Interval H&P Note (Signed)
History and Physical Interval Note:  08/27/2011 9:23 AM  [name]  has presented today for surgery, with the diagnosis of right breast papilloma   The various methods of treatment have been discussed with the patient and family. After consideration of risks, benefits and other options for treatment, the patient has consented to  Procedure(s) (LRB): BREAST LUMPECTOMY WITH NEEDLE LOCALIZATION (Right) as a surgical intervention .  The patients' history has been reviewed, patient examined, no change in status, stable for surgery.  I have reviewed the patients' chart and labs.  Questions were answered to the patient's satisfaction.     Mahlon Gabrielle A.

## 2011-08-27 NOTE — Discharge Instructions (Signed)
[facility] °Office Phone Number [phone] ° °BREAST BIOPSY/ PARTIAL MASTECTOMY: POST OP INSTRUCTIONS ° °Always review your discharge instruction sheet given to you by the facility where your surgery was performed. ° °IF YOU HAVE DISABILITY OR FAMILY LEAVE FORMS, YOU MUST BRING THEM TO THE OFFICE FOR PROCESSING.  DO NOT GIVE THEM TO YOUR DOCTOR. ° °1. A prescription for pain medication may be given to you upon discharge.  Take your pain medication as prescribed, if needed.  If narcotic pain medicine is not needed, then you may take acetaminophen (Tylenol) or ibuprofen (Advil) as needed. °2. Take your usually prescribed medications unless otherwise directed °3. If you need a refill on your pain medication, please contact your pharmacy.  They will contact our office to request authorization.  Prescriptions will not be filled after 5pm or on week-ends. °4. You should eat very light the first 24 hours after surgery, such as soup, crackers, pudding, etc.  Resume your normal diet the day after surgery. °5. Most patients will experience some swelling and bruising in the breast.  Ice packs and a good support bra will help.  Swelling and bruising can take several days to resolve.  °6. It is common to experience some constipation if taking pain medication after surgery.  Increasing fluid intake and taking a stool softener will usually help or prevent this problem from occurring.  A mild laxative (Milk of Magnesia or Miralax) should be taken according to package directions if there are no bowel movements after 48 hours. °7. Unless discharge instructions indicate otherwise, you may remove your bandages 24-48 hours after surgery, and you may shower at that time.  You may have steri-strips (small skin tapes) in place directly over the incision.  These strips should be left on the skin for 7-10 days.  If your surgeon used skin glue on the incision, you may shower in 24 hours.  The glue will flake off over the  next 2-3 weeks.  Any sutures or staples will be removed at the office during your follow-up visit. °8. ACTIVITIES:  You may resume regular daily activities (gradually increasing) beginning the next day.  Wearing a good support bra or sports bra minimizes pain and swelling.  You may have sexual intercourse when it is comfortable. °a. You may drive when you no longer are taking prescription pain medication, you can comfortably wear a seatbelt, and you can safely maneuver your car and apply brakes. °b. RETURN TO WORK:  ______________________________________________________________________________________ °9. You should see your doctor in the office for a follow-up appointment approximately two weeks after your surgery.  Your doctor’s nurse will typically make your follow-up appointment when she calls you with your pathology report.  Expect your pathology report 2-3 business days after your surgery.  You may call to check if you do not hear from us after three days. °10. OTHER INSTRUCTIONS: _______________________________________________________________________________________________ _____________________________________________________________________________________________________________________________________ °_____________________________________________________________________________________________________________________________________ °_____________________________________________________________________________________________________________________________________ ° °WHEN TO CALL YOUR DOCTOR: °1. Fever over 101.0 °2. Nausea and/or vomiting. °3. Extreme swelling or bruising. °4. Continued bleeding from incision. °5. Increased pain, redness, or drainage from the incision. ° °The clinic staff is available to answer your questions during regular business hours.  Please don’t hesitate to call and ask to speak to one of the nurses for clinical concerns.  If you have a medical emergency, go to the nearest  emergency room or call 911.  A surgeon from [facility] is always on call at the hospital. ° °For further questions, please visit [email or web address]  ° ° °  Post Anesthesia Home Care Instructions ° °Activity: °Get plenty of rest for the remainder of the day. A responsible adult should stay with you for 24 hours following the procedure.  °For the next 24 hours, DO NOT: °-Drive a car °-Operate machinery °-Drink alcoholic beverages °-Take any medication unless instructed by your physician °-Make any legal decisions or sign important papers. ° °Meals: °Start with liquid foods such as gelatin or soup. Progress to regular foods as tolerated. Avoid greasy, spicy, heavy foods. If nausea and/or vomiting occur, drink only clear liquids until the nausea and/or vomiting subsides. Call your physician if vomiting continues. ° °Special Instructions/Symptoms: °Your throat may feel dry or sore from the anesthesia or the breathing tube placed in your throat during surgery. If this causes discomfort, gargle with warm salt water. The discomfort should disappear within 24 hours. ° °

## 2011-09-11 ENCOUNTER — Encounter (INDEPENDENT_AMBULATORY_CARE_PROVIDER_SITE_OTHER): Payer: Self-pay

## 2011-09-17 ENCOUNTER — Ambulatory Visit (INDEPENDENT_AMBULATORY_CARE_PROVIDER_SITE_OTHER): Payer: Medicare Other | Admitting: Surgery

## 2011-09-17 ENCOUNTER — Encounter (INDEPENDENT_AMBULATORY_CARE_PROVIDER_SITE_OTHER): Payer: Self-pay | Admitting: Surgery

## 2011-09-17 ENCOUNTER — Encounter (INDEPENDENT_AMBULATORY_CARE_PROVIDER_SITE_OTHER): Payer: Self-pay

## 2011-09-17 VITALS — BP 132/84 | HR 70 | Temp 97.8°F | Resp 14 | Ht 65.0 in | Wt 185.0 lb

## 2011-09-17 DIAGNOSIS — Z9889 Other specified postprocedural states: Secondary | ICD-10-CM

## 2011-09-17 NOTE — Patient Instructions (Signed)
Mammogram in 3 months.  We will schedule.  Return as needed.

## 2011-09-17 NOTE — Progress Notes (Signed)
Patient returns after right breast needle localized lumpectomy. Pathology showed papilloma. The clip and wire were separated during the procedure. I reviewed radiographs and could not clearly see the clip but the radiologist thought they could. She's doing well.  Exam: Right breast incision clean dry and intact without signs of infection  Impression: Status post right breast milk was lumpectomy for papilloma with separation clip from wire  Plan: Repeat mammogram right breast in 3 months to verify that the clip was then removed. Return to clinic as needed.

## 2011-12-10 ENCOUNTER — Encounter (INDEPENDENT_AMBULATORY_CARE_PROVIDER_SITE_OTHER): Payer: Self-pay

## 2012-07-23 ENCOUNTER — Other Ambulatory Visit: Payer: Self-pay | Admitting: Dermatology

## 2012-11-05 ENCOUNTER — Other Ambulatory Visit: Payer: Self-pay | Admitting: Family Medicine

## 2012-11-05 DIAGNOSIS — R1011 Right upper quadrant pain: Secondary | ICD-10-CM

## 2012-11-10 ENCOUNTER — Ambulatory Visit
Admission: RE | Admit: 2012-11-10 | Discharge: 2012-11-10 | Disposition: A | Discharge status: Home / Self Care | Source: Ambulatory Visit | Attending: Family Medicine | Admitting: Family Medicine

## 2012-11-10 DIAGNOSIS — R1011 Right upper quadrant pain: Secondary | ICD-10-CM

## 2012-12-29 ENCOUNTER — Encounter (INDEPENDENT_AMBULATORY_CARE_PROVIDER_SITE_OTHER): Payer: Self-pay

## 2012-12-30 ENCOUNTER — Other Ambulatory Visit: Payer: Self-pay | Admitting: Radiology

## 2013-01-04 ENCOUNTER — Encounter (INDEPENDENT_AMBULATORY_CARE_PROVIDER_SITE_OTHER): Payer: Self-pay

## 2015-01-25 ENCOUNTER — Encounter: Payer: Self-pay | Admitting: Internal Medicine

## 2015-11-15 ENCOUNTER — Encounter (HOSPITAL_COMMUNITY): Payer: Self-pay

## 2015-11-17 ENCOUNTER — Ambulatory Visit: Payer: Self-pay | Admitting: Orthopedic Surgery

## 2015-11-22 ENCOUNTER — Ambulatory Visit: Payer: Self-pay | Admitting: Orthopedic Surgery

## 2015-11-22 ENCOUNTER — Encounter (HOSPITAL_COMMUNITY): Payer: Self-pay

## 2015-11-22 ENCOUNTER — Encounter (HOSPITAL_COMMUNITY)
Admission: RE | Admit: 2015-11-22 | Discharge: 2015-11-22 | Disposition: A | Discharge status: Home / Self Care | Payer: Medicare Other | Source: Ambulatory Visit | Attending: Orthopedic Surgery | Admitting: Orthopedic Surgery

## 2015-11-22 ENCOUNTER — Other Ambulatory Visit: Payer: Self-pay

## 2015-11-22 DIAGNOSIS — I10 Essential (primary) hypertension: Secondary | ICD-10-CM | POA: Insufficient documentation

## 2015-11-22 DIAGNOSIS — Z01810 Encounter for preprocedural cardiovascular examination: Secondary | ICD-10-CM | POA: Diagnosis present

## 2015-11-22 DIAGNOSIS — Z01812 Encounter for preprocedural laboratory examination: Secondary | ICD-10-CM | POA: Diagnosis present

## 2015-11-22 HISTORY — DX: Syncope and collapse: R55

## 2015-11-22 LAB — CBC
HCT: 43 % (ref 36.0–46.0)
HEMOGLOBIN: 14.6 g/dL (ref 12.0–15.0)
MCH: 32 pg (ref 26.0–34.0)
MCHC: 34 g/dL (ref 30.0–36.0)
MCV: 94.3 fL (ref 78.0–100.0)
PLATELETS: 210 10*3/uL (ref 150–400)
RBC: 4.56 MIL/uL (ref 3.87–5.11)
RDW: 12.6 % (ref 11.5–15.5)
WBC: 6.7 10*3/uL (ref 4.0–10.5)

## 2015-11-22 LAB — ABO/RH: ABO/RH(D): A NEG

## 2015-11-22 LAB — URINALYSIS, ROUTINE W REFLEX MICROSCOPIC
Bilirubin Urine: NEGATIVE
GLUCOSE, UA: NEGATIVE mg/dL
Ketones, ur: NEGATIVE mg/dL
Nitrite: NEGATIVE
PH: 6 (ref 5.0–8.0)
PROTEIN: NEGATIVE mg/dL
SPECIFIC GRAVITY, URINE: 1.017 (ref 1.005–1.030)

## 2015-11-22 LAB — COMPREHENSIVE METABOLIC PANEL
ALK PHOS: 61 U/L (ref 38–126)
ALT: 23 U/L (ref 14–54)
ANION GAP: 8 (ref 5–15)
AST: 27 U/L (ref 15–41)
Albumin: 4.6 g/dL (ref 3.5–5.0)
BUN: 20 mg/dL (ref 6–20)
CALCIUM: 9.7 mg/dL (ref 8.9–10.3)
CO2: 28 mmol/L (ref 22–32)
CREATININE: 0.83 mg/dL (ref 0.44–1.00)
Chloride: 103 mmol/L (ref 101–111)
Glucose, Bld: 93 mg/dL (ref 65–99)
Potassium: 4.2 mmol/L (ref 3.5–5.1)
SODIUM: 139 mmol/L (ref 135–145)
Total Bilirubin: 0.7 mg/dL (ref 0.3–1.2)
Total Protein: 7.8 g/dL (ref 6.5–8.1)

## 2015-11-22 LAB — URINE MICROSCOPIC-ADD ON

## 2015-11-22 LAB — PROTIME-INR
INR: 0.97
PROTHROMBIN TIME: 12.9 s (ref 11.4–15.2)

## 2015-11-22 LAB — APTT: aPTT: 27 seconds (ref 24–36)

## 2015-11-22 LAB — SURGICAL PCR SCREEN
MRSA, PCR: NEGATIVE
STAPHYLOCOCCUS AUREUS: NEGATIVE

## 2015-11-22 NOTE — Progress Notes (Signed)
11-22-15 Urinalysis report viewable in Epic- please note.

## 2015-11-22 NOTE — Patient Instructions (Addendum)
[name]  11/22/2015   Your procedure is scheduled on: 11-29-15   Report to [facility]  Entrance take [facility]  elevators to 3rd floor to  [name] at    1:00 PM.  Call this number if you have problems the morning of surgery [phone]   Remember: ONLY 1 PERSON MAY GO WITH YOU TO SHORT STAY TO GET  READY MORNING OF [place].  Do not eat food or drink liquids :After Midnight.Exception Clear liquids- 12 midnight to 1000 AM, then nothing:   CLEAR LIQUID DIET   Foods Allowed                                                                     Foods Excluded  Coffee and tea, regular and decaf                             liquids that you cannot  Plain Jell-O in any flavor                                             see through such as: Fruit ices (not with fruit pulp)                                     milk, soups, orange juice  Iced Popsicles                                    All solid food Carbonated beverages, regular and diet                                    Cranberry, grape and apple juices Sports drinks like Gatorade Lightly seasoned clear broth or consume(fat free) Sugar, honey syrup  _____________________________________________________________________       Take these medicines the morning of surgery with A SIP OF WATER: none. DO NOT TAKE ANY DIABETIC MEDICATIONS DAY OF YOUR SURGERY                               You may not have any metal on your body including hair pins and              piercings  Do not wear jewelry, make-up, lotions, powders or perfumes, deodorant             Do not wear nail polish.  Do not shave  48 hours prior to surgery.              Men may shave face and neck.   Do not bring valuables to the hospital. [place]  VALUABLES.  Contacts, dentures or bridgework may not be worn into surgery.  Leave suitcase in the car. After surgery it may be brought to your  room.     Patients discharged the day of surgery will not be allowed to drive home.  Name and phone number of your driver:[name] -spouse [phone] cell  Special Instructions: N/A              Please read over the following fact sheets you were given: _____________________________________________________________________             [facility] - Preparing for Surgery Before surgery, you can play an important role.  Because skin is not sterile, your skin needs to be as free of germs as possible.  You can reduce the number of germs on your skin by washing with CHG (chlorahexidine gluconate) soap before surgery.  CHG is an antiseptic cleaner which kills germs and bonds with the skin to continue killing germs even after washing. Please DO NOT use if you have an allergy to CHG or antibacterial soaps.  If your skin becomes reddened/irritated stop using the CHG and inform your nurse when you arrive at Short Stay. Do not shave (including legs and underarms) for at least 48 hours prior to the first CHG shower.  You may shave your face/neck. Please follow these instructions carefully:  1.  Shower with CHG Soap the night before surgery and the  morning of Surgery.  2.  If you choose to wash your hair, wash your hair first as usual with your  normal  shampoo.  3.  After you shampoo, rinse your hair and body thoroughly to remove the  shampoo.                           4.  Use CHG as you would any other liquid soap.  You can apply chg directly  to the skin and wash                       Gently with a scrungie or clean washcloth.  5.  Apply the CHG Soap to your body ONLY FROM THE NECK DOWN.   Do not use on face/ open                           Wound or open sores. Avoid contact with eyes, ears mouth and genitals (private parts).                       Wash face,  Genitals (private parts) with your normal soap.             6.  Wash thoroughly, paying special attention to the area where your surgery  will be  performed.  7.  Thoroughly rinse your body with warm water from the neck down.  8.  DO NOT shower/wash with your normal soap after using and rinsing off  the CHG Soap.                9.  Pat yourself dry with a clean towel.            10.  Wear clean pajamas.            11.  Place clean sheets on your bed the night of your first shower and do not  sleep with pets. Day of Surgery :  Do not apply any lotions/deodorants the morning of surgery.  Please wear clean clothes to the hospital/surgery center.  FAILURE TO FOLLOW THESE INSTRUCTIONS MAY RESULT IN THE CANCELLATION OF YOUR SURGERY PATIENT SIGNATURE_________________________________  NURSE SIGNATURE__________________________________  ________________________________________________________________________   Adam Phenix  An incentive spirometer is a tool that can help keep your lungs clear and active. This tool measures how well you are filling your lungs with each breath. Taking long deep breaths may help reverse or decrease the chance of developing breathing (pulmonary) problems (especially infection) following:  A long period of time when you are unable to move or be active. BEFORE THE PROCEDURE   If the spirometer includes an indicator to show your best effort, your nurse or respiratory therapist will set it to a desired goal.  If possible, sit up straight or lean slightly forward. Try not to slouch.  Hold the incentive spirometer in an upright position. INSTRUCTIONS FOR USE  1. Sit on the edge of your bed if possible, or sit up as far as you can in bed or on a chair. 2. Hold the incentive spirometer in an upright position. 3. Breathe out normally. 4. Place the mouthpiece in your mouth and seal your lips tightly around it. 5. Breathe in slowly and as deeply as possible, raising the piston or the ball toward the top of the column. 6. Hold your breath for 3-5 seconds or for as long as possible. Allow the piston or ball to  fall to the bottom of the column. 7. Remove the mouthpiece from your mouth and breathe out normally. 8. Rest for a few seconds and repeat Steps 1 through 7 at least 10 times every 1-2 hours when you are awake. Take your time and take a few normal breaths between deep breaths. 9. The spirometer may include an indicator to show your best effort. Use the indicator as a goal to work toward during each repetition. 10. After each set of 10 deep breaths, practice coughing to be sure your lungs are clear. If you have an incision (the cut made at the time of surgery), support your incision when coughing by placing a pillow or rolled up towels firmly against it. Once you are able to get out of bed, walk around indoors and cough well. You may stop using the incentive spirometer when instructed by your caregiver.  RISKS AND COMPLICATIONS  Take your time so you do not get dizzy or light-headed.  If you are in pain, you may need to take or ask for pain medication before doing incentive spirometry. It is harder to take a deep breath if you are having pain. AFTER USE  Rest and breathe slowly and easily.  It can be helpful to keep track of a log of your progress. Your caregiver can provide you with a simple table to help with this. If you are using the spirometer at home, follow these instructions: [place] IF:   You are having difficultly using the spirometer.  You have trouble using the spirometer as often as instructed.  Your pain medication is not giving enough relief while using the spirometer.  You develop fever of 100.5 F (38.1 C) or higher. SEEK IMMEDIATE MEDICAL CARE IF:   You cough up bloody sputum that had not been present before.  You develop fever of 102 F (38.9 C) or greater.  You develop worsening pain at or near the incision site. MAKE SURE YOU:   Understand these instructions.  Will watch your condition.  Will get help right away if you are not doing well or get  worse. Document Released: 07/15/2006 Document Revised: 05/27/2011 Document Reviewed: 09/15/2006 Exit[employer] Patient Information 2014 [employer], [place].   ________________________________________________________________________  WHAT IS A BLOOD TRANSFUSION? Blood Transfusion Information  A transfusion is the replacement of blood or some of its parts. Blood is made up of multiple cells which provide different functions.  Red blood cells carry oxygen and are used for blood loss replacement.  White blood cells fight against infection.  Platelets control bleeding.  Plasma helps clot blood.  Other blood products are available for specialized needs, such as hemophilia or other clotting disorders. BEFORE THE TRANSFUSION  Who gives blood for transfusions?   Healthy volunteers who are fully evaluated to make sure their blood is safe. This is blood bank blood. Transfusion therapy is the safest it has ever been in the practice of medicine. Before blood is taken from a donor, a complete history is taken to make sure that person has no history of diseases nor engages in risky social behavior (examples are intravenous drug use or sexual activity with multiple partners). The donor's travel history is screened to minimize risk of transmitting infections, such as malaria. The donated blood is tested for signs of infectious diseases, such as HIV and hepatitis. The blood is then tested to be sure it is compatible with you in order to minimize the chance of a transfusion reaction. If you or a relative donates blood, this is often done in anticipation of surgery and is not appropriate for emergency situations. It takes many days to process the donated blood. RISKS AND COMPLICATIONS Although transfusion therapy is very safe and saves many lives, the main dangers of transfusion include:   Getting an infectious disease.  Developing a transfusion reaction. This is an allergic reaction to something in the blood you  were given. Every precaution is taken to prevent this. The decision to have a blood transfusion has been considered carefully by your caregiver before blood is given. Blood is not given unless the benefits outweigh the risks. AFTER THE TRANSFUSION  Right after receiving a blood transfusion, you will usually feel much better and more energetic. This is especially true if your red blood cells have gotten low (anemic). The transfusion raises the level of the red blood cells which carry oxygen, and this usually causes an energy increase.  The nurse administering the transfusion will monitor you carefully for complications. HOME CARE INSTRUCTIONS  No special instructions are needed after a transfusion. You may find your energy is better. Speak with your caregiver about any limitations on activity for underlying diseases you may have. SEEK MEDICAL CARE IF:   Your condition is not improving after your transfusion.  You develop redness or irritation at the intravenous (IV) site. SEEK IMMEDIATE MEDICAL CARE IF:  Any of the following symptoms occur over the next 12 hours:  Shaking chills.  You have a temperature by mouth above 102 F (38.9 C), not controlled by medicine.  Chest, back, or muscle pain.  People around you feel you are not acting correctly or are confused.  Shortness of breath or difficulty breathing.  Dizziness and fainting.  You get a rash or develop hives.  You have a decrease in urine output.  Your urine turns a dark color or changes to pink, red, or brown. Any of the following symptoms occur over the next 10 days:  You have a temperature by mouth above 102 F (38.9 C), not controlled  by medicine.  Shortness of breath.  Weakness after normal activity.  The white part of the eye turns yellow (jaundice).  You have a decrease in the amount of urine or are urinating less often.  Your urine turns a dark color or changes to pink, red, or brown. Document Released:  03/01/2000 Document Revised: 05/27/2011 Document Reviewed: 10/19/2007 [facility] Patient Information 2014 [place], [place].  _______________________________________________________________________

## 2015-11-22 NOTE — Pre-Procedure Instructions (Addendum)
Clearance note-Dr. Laurann [place] with chart. EKG done today.

## 2015-11-28 ENCOUNTER — Ambulatory Visit: Payer: Self-pay | Admitting: Orthopedic Surgery

## 2015-11-28 NOTE — H&P (Signed)
[name] DOB: [birth year] Married / Language: English / Race: White Female Date of Admission:  11/29/2015 CC:  Right Hip Pain History of Present Illness The patient is a 70 year old female who comes in for a preoperative History and Physical. The patient is scheduled for a right total hip arthroplasty (anterior) to be performed by Dr. Dione Plover. [name], MD at [facility] on 11/29/2015 . The patient is a 70 year old female who presented for follow up of their hip. The patient is being followed for their right hip pain. They are out from IA-Injection. The following medication has been used for pain control: none. The patient has reported improvement of their symptoms with: rest. Ms. Malczewski was seen for recheck of her right hip. She has had problems with that hip for a couple years now getting progressively worse in the past year. It got to the point where it is hurting her at all times including at night. She had significant limitations in function and limitations in motion. Her radiographs showed bone-on-bone arthritis with some erosion of the articular surface. She had an intra-articular injection a while back and notes considerable improvement with that. Currently, she feels that she is well improved, but she still has all the functional limitations and wants to pursue her surgery. She is ready to proceed with hip replacement. They have been treated conservatively in the past for the above stated problem and despite conservative measures, they continue to have progressive pain and severe functional limitations and dysfunction. They have failed non-operative management including home exercise, medications, and injections. It is felt that they would benefit from undergoing total joint replacement. Risks and benefits of the procedure have been discussed with the patient and they elect to proceed with surgery. There are no active contraindications to surgery such as ongoing infection or rapidly  progressive neurological disease.   Problem List/Past Medical  Primary osteoarthritis of right hip (M16.11)  Gastroesophageal Reflux Disease  High blood pressure  Hypercholesterolemia  Urinary Incontinence  Stress Menopause  Mumps  Measles  Allergies  No Known Drug Allergies  Family History  Cerebrovascular Accident  Maternal Grandmother, Paternal Grandmother. Heart Disease  Father. Kidney disease  Mother. Liver Disease, Chronic  Brother, Mother.  Social History Children  2 Current drinker  03/23/2015: Currently drinks wine less than 5 times per week Current work status  retired Furniture conservator/restorer daily; does running / walking Living situation  live with spouse Marital status  married No history of drug/alcohol rehab  Not under pain contract  Number of flights of stairs before winded  2-3 Tobacco / smoke exposure  03/23/2015: no Tobacco use  Former smoker. 03/23/2015: smoke(d) 1/2 pack(s) per day  Medication History Aleve (Oral) Specific strength unknown - Active. Telmisartan-HCTZ (Oral) Specific strength unknown - Active. Atorvastatin Calcium (80MG  Tablet, Oral) Active. Zetia (10MG  Tablet, Oral) Active.   Past Surgical History Breast Mass; Local Excision  right Dilation and Curettage of Uterus  Foot Surgery  bilateral Leg Circulation Surgery  right Tonsillectomy   Review of Systems General Not Present- Chills, Fatigue, Fever, Memory Loss, Night Sweats, Weight Gain and Weight Loss. Skin Not Present- Eczema, Hives, Itching, Lesions and Rash. HEENT Not Present- Dentures, Double Vision, Headache, Hearing Loss, Tinnitus and Visual Loss. Respiratory Not Present- Allergies, Chronic Cough, Coughing up blood, Shortness of breath at rest and Shortness of breath with exertion. Cardiovascular Not Present- Chest Pain, Difficulty Breathing Lying Down, Murmur, Palpitations, Racing/skipping heartbeats and Swelling. Gastrointestinal Not  Present- Abdominal Pain, Bloody Stool, Constipation, Diarrhea, Difficulty Swallowing, Heartburn, Jaundice, Loss of appetitie, Nausea and Vomiting. Female Genitourinary Present- Incontinence (stress). Not Present- Blood in Urine, Discharge, Flank Pain, Painful Urination, Urgency, Urinary frequency, Urinary Retention, Urinating at Night and Weak urinary stream. Musculoskeletal Present- Joint Pain. Not Present- Back Pain, Joint Swelling, Morning Stiffness, Muscle Pain, Muscle Weakness and Spasms. Neurological Not Present- Blackout spells, Difficulty with balance, Dizziness, Paralysis, Tremor and Weakness. Psychiatric Not Present- Insomnia.  Vitals Weight: 182 lb Height: 65in Body Surface Area: 1.9 m Body Mass Index: 30.29 kg/m  Pulse: 88 (Regular)  BP: 126/78 (Sitting, Right Arm, Standard)  Physical Exam The physical exam findings are as follows: Note:70 year old female accompanied by her husband Richardson Landry on exam.  General Mental Status -Alert, cooperative and good historian. General Appearance-pleasant, Not in acute distress. Orientation-Oriented X3. Build & Nutrition-Well nourished and Well developed.  Head and Neck Head-normocephalic, atraumatic . Neck Global Assessment - supple, no bruit auscultated on the right, no bruit auscultated on the left.  Eye Vision-Wears corrective lenses. Pupil - Bilateral-Regular and Round. Motion - Bilateral-EOMI.  Chest and Lung Exam Auscultation Breath sounds - clear at anterior chest wall and clear at posterior chest wall. Adventitious sounds - No Adventitious sounds.  Cardiovascular Auscultation Rhythm - Regular rate and rhythm. Heart Sounds - S1 WNL and S2 WNL. Murmurs & Other Heart Sounds - Auscultation of the heart reveals - No Murmurs.  Abdomen Palpation/Percussion Tenderness - Abdomen is non-tender to palpation. Rigidity (guarding) - Abdomen is soft. Auscultation Auscultation of the abdomen reveals - Bowel  sounds normal.  Female Genitourinary Note: Not done, not pertinent to present illness   Musculoskeletal Note: On exam, well-developed female, alert and oriented, in no apparent distress. Her left hip shows normal range of motion with no discomfort. Her right hip can be flexed about 100, no internal rotation, about 10 to 20 of external rotation, 20 abduction. She has an antalgic gait pattern.  IMAGING Her radiographs are reviewed. AP pelvis, lateral the right hip she has severe bone-on-bone arthritis in that right hip with significant osteophyte formation.  Assessment & Plan Primary osteoarthritis of right hip (M16.11)  Note:Surgical Plans: Right Total Hip Replacement - Anterior Approach  Disposition: Home  PCP: Dr. Laurann Montana  IV TXA  Anesthesia Issues: Slow to wake up last time with with sedation and also prone to nausea.  Signed electronically by Ok Edwards, III PA-C

## 2015-11-29 ENCOUNTER — Inpatient Hospital Stay (HOSPITAL_COMMUNITY): Payer: Medicare Other

## 2015-11-29 ENCOUNTER — Encounter (HOSPITAL_COMMUNITY): Payer: Self-pay

## 2015-11-29 ENCOUNTER — Inpatient Hospital Stay (HOSPITAL_COMMUNITY): Payer: Medicare Other | Admitting: Anesthesiology

## 2015-11-29 ENCOUNTER — Inpatient Hospital Stay (HOSPITAL_COMMUNITY)
Admission: RE | Admit: 2015-11-29 | Discharge: 2015-12-01 | DRG: 470 | Disposition: A | Discharge status: Home Health | Payer: Medicare Other | Source: Ambulatory Visit | Attending: Orthopedic Surgery | Admitting: Orthopedic Surgery

## 2015-11-29 ENCOUNTER — Encounter (HOSPITAL_COMMUNITY)
Admission: RE | Disposition: A | Discharge status: Home Health | Payer: Self-pay | Source: Ambulatory Visit | Attending: Orthopedic Surgery

## 2015-11-29 DIAGNOSIS — M1611 Unilateral primary osteoarthritis, right hip: Principal | ICD-10-CM | POA: Diagnosis present

## 2015-11-29 DIAGNOSIS — K219 Gastro-esophageal reflux disease without esophagitis: Secondary | ICD-10-CM | POA: Diagnosis present

## 2015-11-29 DIAGNOSIS — Z96649 Presence of unspecified artificial hip joint: Secondary | ICD-10-CM

## 2015-11-29 DIAGNOSIS — M25551 Pain in right hip: Secondary | ICD-10-CM | POA: Diagnosis present

## 2015-11-29 DIAGNOSIS — R2689 Other abnormalities of gait and mobility: Secondary | ICD-10-CM

## 2015-11-29 DIAGNOSIS — I10 Essential (primary) hypertension: Secondary | ICD-10-CM | POA: Diagnosis present

## 2015-11-29 DIAGNOSIS — E785 Hyperlipidemia, unspecified: Secondary | ICD-10-CM | POA: Diagnosis present

## 2015-11-29 DIAGNOSIS — Z87891 Personal history of nicotine dependence: Secondary | ICD-10-CM | POA: Diagnosis not present

## 2015-11-29 DIAGNOSIS — M169 Osteoarthritis of hip, unspecified: Secondary | ICD-10-CM | POA: Diagnosis present

## 2015-11-29 HISTORY — PX: TOTAL HIP ARTHROPLASTY: SHX124

## 2015-11-29 LAB — TYPE AND SCREEN
ABO/RH(D): A NEG
Antibody Screen: NEGATIVE

## 2015-11-29 SURGERY — ARTHROPLASTY, HIP, TOTAL, ANTERIOR APPROACH
Anesthesia: Spinal | Site: Hip | Laterality: Right

## 2015-11-29 MED ORDER — METHOCARBAMOL 500 MG PO TABS
500.0000 mg | ORAL_TABLET | Freq: Four times a day (QID) | ORAL | Status: DC | PRN
  Administered 2015-12-01: 500 mg via ORAL
  Filled 2015-11-29: qty 1

## 2015-11-29 MED ORDER — ONDANSETRON HCL 4 MG/2ML IJ SOLN
INTRAMUSCULAR | Status: AC
  Filled 2015-11-29: qty 2

## 2015-11-29 MED ORDER — BUPIVACAINE HCL (PF) 0.5 % IJ SOLN
INTRAMUSCULAR | Status: DC | PRN
  Administered 2015-11-29: 3 mL

## 2015-11-29 MED ORDER — CEFAZOLIN SODIUM-DEXTROSE 2-4 GM/100ML-% IV SOLN
2.0000 g | INTRAVENOUS | Status: AC
  Administered 2015-11-29: 2 g via INTRAVENOUS

## 2015-11-29 MED ORDER — FENTANYL CITRATE (PF) 100 MCG/2ML IJ SOLN
INTRAMUSCULAR | Status: AC
  Filled 2015-11-29: qty 2

## 2015-11-29 MED ORDER — MENTHOL 3 MG MT LOZG: 1.0000 | LOZENGE | OROMUCOSAL | Status: DC | PRN

## 2015-11-29 MED ORDER — TRAMADOL HCL 50 MG PO TABS: 50.0000 mg | ORAL_TABLET | Freq: Four times a day (QID) | ORAL | Status: DC | PRN

## 2015-11-29 MED ORDER — METOCLOPRAMIDE HCL 5 MG/ML IJ SOLN: 5.0000 mg | Freq: Three times a day (TID) | INTRAMUSCULAR | Status: DC | PRN

## 2015-11-29 MED ORDER — FLEET ENEMA 7-19 GM/118ML RE ENEM: 1.0000 | ENEMA | Freq: Once | RECTAL | Status: DC | PRN

## 2015-11-29 MED ORDER — PHENYLEPHRINE 40 MCG/ML (10ML) SYRINGE FOR IV PUSH (FOR BLOOD PRESSURE SUPPORT)
PREFILLED_SYRINGE | INTRAVENOUS | Status: AC
  Filled 2015-11-29: qty 10

## 2015-11-29 MED ORDER — EPHEDRINE SULFATE-NACL 50-0.9 MG/10ML-% IV SOSY
PREFILLED_SYRINGE | INTRAVENOUS | Status: DC | PRN
  Administered 2015-11-29: 5 mg via INTRAVENOUS
  Administered 2015-11-29: 10 mg via INTRAVENOUS

## 2015-11-29 MED ORDER — MEPERIDINE HCL 50 MG/ML IJ SOLN: 6.2500 mg | INTRAMUSCULAR | Status: DC | PRN

## 2015-11-29 MED ORDER — FENTANYL CITRATE (PF) 100 MCG/2ML IJ SOLN
INTRAMUSCULAR | Status: DC | PRN
  Administered 2015-11-29: 25 ug via INTRAVENOUS
  Administered 2015-11-29: 50 ug via INTRAVENOUS
  Administered 2015-11-29: 25 ug via INTRAVENOUS

## 2015-11-29 MED ORDER — ONDANSETRON HCL 4 MG/2ML IJ SOLN: 4.0000 mg | Freq: Once | INTRAMUSCULAR | Status: DC | PRN

## 2015-11-29 MED ORDER — CEFAZOLIN SODIUM-DEXTROSE 2-4 GM/100ML-% IV SOLN
INTRAVENOUS | Status: AC
Start: 2015-11-29 — End: 2015-11-29
  Filled 2015-11-29: qty 100

## 2015-11-29 MED ORDER — TRANEXAMIC ACID 1000 MG/10ML IV SOLN
1000.0000 mg | Freq: Once | INTRAVENOUS | Status: AC
  Administered 2015-11-29: 1000 mg via INTRAVENOUS
  Filled 2015-11-29: qty 10

## 2015-11-29 MED ORDER — CHLORHEXIDINE GLUCONATE 4 % EX LIQD: 60.0000 mL | Freq: Once | CUTANEOUS | Status: DC

## 2015-11-29 MED ORDER — CEFAZOLIN SODIUM-DEXTROSE 2-4 GM/100ML-% IV SOLN
2.0000 g | Freq: Four times a day (QID) | INTRAVENOUS | Status: AC
  Administered 2015-11-29 – 2015-11-30 (×2): 2 g via INTRAVENOUS
  Filled 2015-11-29 (×2): qty 100

## 2015-11-29 MED ORDER — PROPOFOL 10 MG/ML IV BOLUS
INTRAVENOUS | Status: AC
  Filled 2015-11-29: qty 20

## 2015-11-29 MED ORDER — HYDROCHLOROTHIAZIDE 12.5 MG PO CAPS
12.5000 mg | ORAL_CAPSULE | Freq: Every day | ORAL | Status: DC
  Administered 2015-11-30: 12.5 mg via ORAL
  Filled 2015-11-29: qty 1

## 2015-11-29 MED ORDER — ACETAMINOPHEN 10 MG/ML IV SOLN
1000.0000 mg | Freq: Once | INTRAVENOUS | Status: AC
Start: 2015-11-29 — End: 2015-11-29
  Administered 2015-11-29: 1000 mg via INTRAVENOUS

## 2015-11-29 MED ORDER — OXYCODONE HCL 5 MG PO TABS
5.0000 mg | ORAL_TABLET | ORAL | Status: DC | PRN
Start: 2015-11-29 — End: 2015-12-01
  Administered 2015-11-29 – 2015-12-01 (×9): 5 mg via ORAL
  Filled 2015-11-29 (×11): qty 1

## 2015-11-29 MED ORDER — TRANEXAMIC ACID 1000 MG/10ML IV SOLN
1000.0000 mg | INTRAVENOUS | Status: AC
  Administered 2015-11-29: 1000 mg via INTRAVENOUS
  Filled 2015-11-29: qty 10

## 2015-11-29 MED ORDER — PROPOFOL 10 MG/ML IV BOLUS
INTRAVENOUS | Status: AC
  Filled 2015-11-29: qty 40

## 2015-11-29 MED ORDER — EPHEDRINE 5 MG/ML INJ
INTRAVENOUS | Status: AC
  Filled 2015-11-29: qty 10

## 2015-11-29 MED ORDER — LACTATED RINGERS IV SOLN
INTRAVENOUS | Status: DC
  Administered 2015-11-29 (×3): via INTRAVENOUS

## 2015-11-29 MED ORDER — SODIUM CHLORIDE 0.9 % IV SOLN
INTRAVENOUS | Status: DC
  Administered 2015-11-29: 1000 mL via INTRAVENOUS
  Administered 2015-11-30: 06:00:00 via INTRAVENOUS

## 2015-11-29 MED ORDER — DEXAMETHASONE SODIUM PHOSPHATE 10 MG/ML IJ SOLN
10.0000 mg | Freq: Once | INTRAMUSCULAR | Status: AC
  Administered 2015-11-29: 10 mg via INTRAVENOUS

## 2015-11-29 MED ORDER — ATORVASTATIN CALCIUM 20 MG PO TABS
80.0000 mg | ORAL_TABLET | Freq: Every evening | ORAL | Status: DC
  Administered 2015-11-30: 80 mg via ORAL
  Filled 2015-11-29: qty 4

## 2015-11-29 MED ORDER — POLYETHYLENE GLYCOL 3350 17 G PO PACK: 17.0000 g | PACK | Freq: Every day | ORAL | Status: DC | PRN

## 2015-11-29 MED ORDER — ONDANSETRON HCL 4 MG/2ML IJ SOLN
4.0000 mg | Freq: Four times a day (QID) | INTRAMUSCULAR | Status: DC | PRN
  Administered 2015-11-29: 4 mg via INTRAVENOUS
  Filled 2015-11-29: qty 2

## 2015-11-29 MED ORDER — TELMISARTAN-HCTZ 40-12.5 MG PO TABS: 1.0000 | ORAL_TABLET | Freq: Every day | ORAL | Status: DC

## 2015-11-29 MED ORDER — ACETAMINOPHEN 10 MG/ML IV SOLN
INTRAVENOUS | Status: AC
  Filled 2015-11-29: qty 100

## 2015-11-29 MED ORDER — BUPIVACAINE HCL (PF) 0.5 % IJ SOLN
INTRAMUSCULAR | Status: AC
  Filled 2015-11-29: qty 30

## 2015-11-29 MED ORDER — DEXAMETHASONE SODIUM PHOSPHATE 10 MG/ML IJ SOLN
INTRAMUSCULAR | Status: AC
Start: 2015-11-29 — End: 2015-11-29
  Filled 2015-11-29: qty 1

## 2015-11-29 MED ORDER — RIVAROXABAN 10 MG PO TABS
10.0000 mg | ORAL_TABLET | Freq: Every day | ORAL | Status: DC
  Administered 2015-11-30 – 2015-12-01 (×2): 10 mg via ORAL
  Filled 2015-11-29 (×2): qty 1

## 2015-11-29 MED ORDER — ACETAMINOPHEN 500 MG PO TABS
1000.0000 mg | ORAL_TABLET | Freq: Four times a day (QID) | ORAL | Status: AC
  Administered 2015-11-29 – 2015-11-30 (×4): 1000 mg via ORAL
  Filled 2015-11-29 (×4): qty 2

## 2015-11-29 MED ORDER — PROPOFOL 10 MG/ML IV BOLUS
INTRAVENOUS | Status: DC | PRN
  Administered 2015-11-29: 20 mg via INTRAVENOUS
  Administered 2015-11-29: 10 mg via INTRAVENOUS
  Administered 2015-11-29: 20 mg via INTRAVENOUS
  Administered 2015-11-29 (×2): 10 mg via INTRAVENOUS
  Administered 2015-11-29: 20 mg via INTRAVENOUS

## 2015-11-29 MED ORDER — BUPIVACAINE HCL (PF) 0.25 % IJ SOLN
INTRAMUSCULAR | Status: DC | PRN
  Administered 2015-11-29: 30 mL

## 2015-11-29 MED ORDER — BISACODYL 10 MG RE SUPP: 10.0000 mg | Freq: Every day | RECTAL | Status: DC | PRN

## 2015-11-29 MED ORDER — DOCUSATE SODIUM 100 MG PO CAPS
100.0000 mg | ORAL_CAPSULE | Freq: Two times a day (BID) | ORAL | Status: DC
  Administered 2015-11-29 – 2015-11-30 (×3): 100 mg via ORAL
  Filled 2015-11-29 (×3): qty 1

## 2015-11-29 MED ORDER — EZETIMIBE 10 MG PO TABS
10.0000 mg | ORAL_TABLET | Freq: Every evening | ORAL | Status: DC
  Administered 2015-11-30: 10 mg via ORAL
  Filled 2015-11-29: qty 1

## 2015-11-29 MED ORDER — ONDANSETRON HCL 4 MG PO TABS: 4.0000 mg | ORAL_TABLET | Freq: Four times a day (QID) | ORAL | Status: DC | PRN

## 2015-11-29 MED ORDER — MORPHINE SULFATE (PF) 2 MG/ML IV SOLN: 1.0000 mg | INTRAVENOUS | Status: DC | PRN

## 2015-11-29 MED ORDER — PROPOFOL 500 MG/50ML IV EMUL
INTRAVENOUS | Status: DC | PRN
  Administered 2015-11-29: 50 ug/kg/min via INTRAVENOUS

## 2015-11-29 MED ORDER — BUPIVACAINE HCL (PF) 0.25 % IJ SOLN
INTRAMUSCULAR | Status: AC
  Filled 2015-11-29: qty 30

## 2015-11-29 MED ORDER — HYDROMORPHONE HCL 1 MG/ML IJ SOLN: 0.2500 mg | INTRAMUSCULAR | Status: DC | PRN

## 2015-11-29 MED ORDER — METOCLOPRAMIDE HCL 5 MG PO TABS: 5.0000 mg | ORAL_TABLET | Freq: Three times a day (TID) | ORAL | Status: DC | PRN

## 2015-11-29 MED ORDER — DIPHENHYDRAMINE HCL 12.5 MG/5ML PO ELIX: 12.5000 mg | ORAL_SOLUTION | ORAL | Status: DC | PRN

## 2015-11-29 MED ORDER — DEXAMETHASONE SODIUM PHOSPHATE 10 MG/ML IJ SOLN
10.0000 mg | Freq: Once | INTRAMUSCULAR | Status: AC
  Administered 2015-11-30: 10 mg via INTRAVENOUS
  Filled 2015-11-29: qty 1

## 2015-11-29 MED ORDER — ACETAMINOPHEN 650 MG RE SUPP: 650.0000 mg | Freq: Four times a day (QID) | RECTAL | Status: DC | PRN

## 2015-11-29 MED ORDER — PHENYLEPHRINE 40 MCG/ML (10ML) SYRINGE FOR IV PUSH (FOR BLOOD PRESSURE SUPPORT)
PREFILLED_SYRINGE | INTRAVENOUS | Status: DC | PRN
  Administered 2015-11-29: 80 ug via INTRAVENOUS
  Administered 2015-11-29: 40 ug via INTRAVENOUS
  Administered 2015-11-29 (×5): 80 ug via INTRAVENOUS

## 2015-11-29 MED ORDER — METHOCARBAMOL 1000 MG/10ML IJ SOLN
500.0000 mg | Freq: Four times a day (QID) | INTRAVENOUS | Status: DC | PRN
  Filled 2015-11-29: qty 5

## 2015-11-29 MED ORDER — IRBESARTAN 150 MG PO TABS
150.0000 mg | ORAL_TABLET | Freq: Every day | ORAL | Status: DC
  Administered 2015-11-30: 150 mg via ORAL
  Filled 2015-11-29: qty 1

## 2015-11-29 MED ORDER — ACETAMINOPHEN 325 MG PO TABS: 650.0000 mg | ORAL_TABLET | Freq: Four times a day (QID) | ORAL | Status: DC | PRN

## 2015-11-29 MED ORDER — ONDANSETRON HCL 4 MG/2ML IJ SOLN
INTRAMUSCULAR | Status: DC | PRN
  Administered 2015-11-29: 4 mg via INTRAVENOUS

## 2015-11-29 MED ORDER — PHENOL 1.4 % MT LIQD: 1.0000 | OROMUCOSAL | Status: DC | PRN

## 2015-11-29 SURGICAL SUPPLY — 36 items
BAG DECANTER FOR FLEXI CONT (MISCELLANEOUS) ×3 IMPLANT
BAG ZIPLOCK 12X15 (MISCELLANEOUS) IMPLANT
BLADE SAG 18X100X1.27 (BLADE) ×3 IMPLANT
CAPT HIP TOTAL 2 ×3 IMPLANT
CLOSURE WOUND 1/2 X4 (GAUZE/BANDAGES/DRESSINGS) ×1
CLOTH BEACON ORANGE TIMEOUT ST (SAFETY) ×3 IMPLANT
COVER PERINEAL POST (MISCELLANEOUS) ×3 IMPLANT
DECANTER SPIKE VIAL GLASS SM (MISCELLANEOUS) ×3 IMPLANT
DRAPE STERI IOBAN 125X83 (DRAPES) ×3 IMPLANT
DRAPE U-SHAPE 47X51 STRL (DRAPES) ×6 IMPLANT
DRSG ADAPTIC 3X8 NADH LF (GAUZE/BANDAGES/DRESSINGS) ×3 IMPLANT
DRSG MEPILEX BORDER 4X4 (GAUZE/BANDAGES/DRESSINGS) ×3 IMPLANT
DRSG MEPILEX BORDER 4X8 (GAUZE/BANDAGES/DRESSINGS) ×3 IMPLANT
DURAPREP 26ML APPLICATOR (WOUND CARE) ×3 IMPLANT
ELECT REM PT RETURN 9FT ADLT (ELECTROSURGICAL) ×3
ELECTRODE REM PT RTRN 9FT ADLT (ELECTROSURGICAL) ×1 IMPLANT
EVACUATOR 1/8 PVC DRAIN (DRAIN) ×3 IMPLANT
GLOVE BIO SURGEON STRL SZ7.5 (GLOVE) ×3 IMPLANT
GLOVE BIO SURGEON STRL SZ8 (GLOVE) ×6 IMPLANT
GLOVE BIOGEL PI IND STRL 8 (GLOVE) ×2 IMPLANT
GLOVE BIOGEL PI INDICATOR 8 (GLOVE) ×4
GOWN STRL REUS W/TWL LRG LVL3 (GOWN DISPOSABLE) ×3 IMPLANT
GOWN STRL REUS W/TWL XL LVL3 (GOWN DISPOSABLE) ×3 IMPLANT
NS IRRIG 1000ML POUR BTL (IV SOLUTION) ×3 IMPLANT
PACK ANTERIOR HIP CUSTOM (KITS) ×3 IMPLANT
STRIP CLOSURE SKIN 1/2X4 (GAUZE/BANDAGES/DRESSINGS) ×2 IMPLANT
SUT ETHIBOND NAB CT1 #1 30IN (SUTURE) ×3 IMPLANT
SUT MNCRL AB 4-0 PS2 18 (SUTURE) ×3 IMPLANT
SUT VIC AB 2-0 CT1 27 (SUTURE) ×6
SUT VIC AB 2-0 CT1 TAPERPNT 27 (SUTURE) ×2 IMPLANT
SUT VLOC 180 0 24IN GS25 (SUTURE) ×3 IMPLANT
SYR 50ML LL SCALE MARK (SYRINGE) IMPLANT
TRAY FOLEY W/METER SILVER 14FR (SET/KITS/TRAYS/PACK) ×3 IMPLANT
TRAY FOLEY W/METER SILVER 16FR (SET/KITS/TRAYS/PACK) IMPLANT
WATER STERILE IRR 1000ML POUR (IV SOLUTION) ×6 IMPLANT
YANKAUER SUCT BULB TIP 10FT TU (MISCELLANEOUS) ×3 IMPLANT

## 2015-11-29 NOTE — H&P (View-Only) (Signed)
[name] DOB: April 15, 1945 Married / Language: English / Race: White Female Date of Admission:  11/29/2015 CC:  Right Hip Pain History of Present Illness The patient is a 70 year old female who comes in for a preoperative History and Physical. The patient is scheduled for a right total hip arthroplasty (anterior) to be performed by Dr. Dione Plover. [name], [name] at [facility] on 11/29/2015 . The patient is a 70 year old female who presented for follow up of their hip. The patient is being followed for their right hip pain. They are out from IA-Injection. The following medication has been used for pain control: none. The patient has reported improvement of their symptoms with: rest. [name] was seen for recheck of her right hip. She has had problems with that hip for a couple years now getting progressively worse in the past year. It got to the point where it is hurting her at all times including at night. She had significant limitations in function and limitations in motion. Her radiographs showed bone-on-bone arthritis with some erosion of the articular surface. She had an intra-articular injection a while back and notes considerable improvement with that. Currently, she feels that she is well improved, but she still has all the functional limitations and wants to pursue her surgery. She is ready to proceed with hip replacement. They have been treated conservatively in the past for the above stated problem and despite conservative measures, they continue to have progressive pain and severe functional limitations and dysfunction. They have failed non-operative management including home exercise, medications, and injections. It is felt that they would benefit from undergoing total joint replacement. Risks and benefits of the procedure have been discussed with the patient and they elect to proceed with surgery. There are no active contraindications to surgery such as ongoing infection or rapidly  progressive neurological disease.   Problem List/Past Medical  Primary osteoarthritis of right hip (M16.11)  Gastroesophageal Reflux Disease  High blood pressure  Hypercholesterolemia  Urinary Incontinence  Stress Menopause  Mumps  Measles  Allergies  No Known Drug Allergies  Family History  Cerebrovascular Accident  Maternal Grandmother, Paternal Grandmother. Heart Disease  Father. Kidney disease  Mother. Liver Disease, Chronic  Brother, Mother.  Social History Children  2 Current drinker  03/23/2015: Currently drinks wine less than 5 times per week Current work status  retired Furniture conservator/restorer daily; does running / walking Living situation  live with spouse Marital status  married No history of drug/alcohol rehab  Not under pain contract  Number of flights of stairs before winded  2-3 Tobacco / smoke exposure  03/23/2015: no Tobacco use  Former smoker. 03/23/2015: smoke(d) 1/2 pack(s) per day  Medication History Aleve (Oral) Specific strength unknown - Active. Telmisartan-HCTZ (Oral) Specific strength unknown - Active. Atorvastatin Calcium (80MG  Tablet, Oral) Active. Zetia (10MG  Tablet, Oral) Active.   Past Surgical History Breast Mass; Local Excision  right Dilation and Curettage of Uterus  Foot Surgery  bilateral Leg Circulation Surgery  right Tonsillectomy   Review of Systems General Not Present- Chills, Fatigue, Fever, Memory Loss, Night Sweats, Weight Gain and Weight Loss. Skin Not Present- Eczema, Hives, Itching, Lesions and Rash. HEENT Not Present- Dentures, Double Vision, Headache, Hearing Loss, Tinnitus and Visual Loss. Respiratory Not Present- Allergies, Chronic Cough, Coughing up blood, Shortness of breath at rest and Shortness of breath with exertion. Cardiovascular Not Present- Chest Pain, Difficulty Breathing Lying Down, Murmur, Palpitations, Racing/skipping heartbeats and Swelling. Gastrointestinal Not  Present- Abdominal Pain, Bloody Stool, Constipation, Diarrhea, Difficulty Swallowing, Heartburn, Jaundice, Loss of appetitie, Nausea and Vomiting. Female Genitourinary Present- Incontinence (stress). Not Present- Blood in Urine, Discharge, Flank Pain, Painful Urination, Urgency, Urinary frequency, Urinary Retention, Urinating at Night and Weak urinary stream. Musculoskeletal Present- Joint Pain. Not Present- Back Pain, Joint Swelling, Morning Stiffness, Muscle Pain, Muscle Weakness and Spasms. Neurological Not Present- Blackout spells, Difficulty with balance, Dizziness, Paralysis, Tremor and Weakness. Psychiatric Not Present- Insomnia.  Vitals Weight: 182 lb Height: 65in Body Surface Area: 1.9 m Body Mass Index: 30.29 kg/m  Pulse: 88 (Regular)  BP: 126/78 (Sitting, Right Arm, Standard)  Physical Exam The physical exam findings are as follows: Note:70 year old female accompanied by her husband Richardson Landry on exam.  General Mental Status -Alert, cooperative and good historian. General Appearance-pleasant, Not in acute distress. Orientation-Oriented X3. Build & Nutrition-Well nourished and Well developed.  Head and Neck Head-normocephalic, atraumatic . Neck Global Assessment - supple, no bruit auscultated on the right, no bruit auscultated on the left.  Eye Vision-Wears corrective lenses. Pupil - Bilateral-Regular and Round. Motion - Bilateral-EOMI.  Chest and Lung Exam Auscultation Breath sounds - clear at anterior chest wall and clear at posterior chest wall. Adventitious sounds - No Adventitious sounds.  Cardiovascular Auscultation Rhythm - Regular rate and rhythm. Heart Sounds - S1 WNL and S2 WNL. Murmurs & Other Heart Sounds - Auscultation of the heart reveals - No Murmurs.  Abdomen Palpation/Percussion Tenderness - Abdomen is non-tender to palpation. Rigidity (guarding) - Abdomen is soft. Auscultation Auscultation of the abdomen reveals - Bowel  sounds normal.  Female Genitourinary Note: Not done, not pertinent to present illness   Musculoskeletal Note: On exam, well-developed female, alert and oriented, in no apparent distress. Her left hip shows normal range of motion with no discomfort. Her right hip can be flexed about 100, no internal rotation, about 10 to 20 of external rotation, 20 abduction. She has an antalgic gait pattern.  IMAGING Her radiographs are reviewed. AP pelvis, lateral the right hip she has severe bone-on-bone arthritis in that right hip with significant osteophyte formation.  Assessment & Plan Primary osteoarthritis of right hip (M16.11)  Note:Surgical Plans: Right Total Hip Replacement - Anterior Approach  Disposition: Home  PCP: Dr. Laurann Montana  IV TXA  Anesthesia Issues: Slow to wake up last time with with sedation and also prone to nausea.  Signed electronically by Ok Edwards, III PA-C

## 2015-11-29 NOTE — Anesthesia Postprocedure Evaluation (Signed)
Anesthesia Post Note  Patient: [name]  Procedure(s) Performed: Procedure(s) (LRB): RIGHT TOTAL HIP ARTHROPLASTY ANTERIOR APPROACH (Right)  Patient location during evaluation: PACU Anesthesia Type: Spinal Level of consciousness: oriented and awake and alert Pain management: pain level controlled Vital Signs Assessment: post-procedure vital signs reviewed and stable Respiratory status: spontaneous breathing, respiratory function stable and patient connected to nasal cannula oxygen Cardiovascular status: blood pressure returned to baseline and stable Postop Assessment: no headache and no backache Anesthetic complications: no    Last Vitals:  Vitals:   11/29/15 1722 11/29/15 1730  BP: 114/66 (!) 116/57  Pulse: 78 76  Resp: 16 14  Temp: 36.4 C     Last Pain:  Vitals:   11/29/15 1730  TempSrc:   PainSc: 0-No pain                 Mainor Hellmann DAVID

## 2015-11-29 NOTE — Transfer of Care (Signed)
Immediate Anesthesia Transfer of Care Note  Patient: [name]  Procedure(s) Performed: Procedure(s): RIGHT TOTAL HIP ARTHROPLASTY ANTERIOR APPROACH (Right)  Patient Location: PACU  Anesthesia Type:Spinal  Level of Consciousness:  sedated, patient cooperative and responds to stimulation  Airway & Oxygen Therapy:Patient Spontanous Breathing and Patient connected to face mask oxgen  Post-op Assessment:  Report given to PACU RN and Post -op Vital signs reviewed and stable  Post vital signs:  Reviewed and stable  Last Vitals:  Vitals:   11/29/15 1304  BP: (!) 169/100  Pulse: 99  Resp: 16  Temp: 123XX123 C    Complications: No apparent anesthesia complications

## 2015-11-29 NOTE — Anesthesia Preprocedure Evaluation (Signed)
Anesthesia Evaluation  Patient identified by MRN, date of birth, ID band Patient awake    Reviewed: Allergy & Precautions, NPO status , Patient's Chart, lab work & pertinent test results  History of Anesthesia Complications (+) PONV  Airway Mallampati: I  TM Distance: >3 FB Neck ROM: Full    Dental   Pulmonary former smoker,    Pulmonary exam normal        Cardiovascular hypertension, Pt. on medications Normal cardiovascular exam     Neuro/Psych    GI/Hepatic GERD  Medicated and Controlled,  Endo/Other    Renal/GU      Musculoskeletal   Abdominal   Peds  Hematology   Anesthesia Other Findings   Reproductive/Obstetrics                             Anesthesia Physical Anesthesia Plan  ASA: II  Anesthesia Plan: Spinal   Post-op Pain Management:    Induction: Intravenous  Airway Management Planned: Simple Face Mask  Additional Equipment:   Intra-op Plan:   Post-operative Plan:   Informed Consent: I have reviewed the patients History and Physical, chart, labs and discussed the procedure including the risks, benefits and alternatives for the proposed anesthesia with the patient or authorized representative who has indicated his/her understanding and acceptance.     Plan Discussed with: CRNA and Surgeon  Anesthesia Plan Comments:         Anesthesia Quick Evaluation

## 2015-11-29 NOTE — Op Note (Signed)
OPERATIVE REPORT- TOTAL HIP ARTHROPLASTY   PREOPERATIVE DIAGNOSIS: Osteoarthritis of the Right hip.   POSTOPERATIVE DIAGNOSIS: Osteoarthritis of the Right  hip.   PROCEDURE: Right total hip arthroplasty, anterior approach.   SURGEON: Gaynelle Arabian, MD   ASSISTANT: Arlee Muslim, PA-C  ANESTHESIA:  Spinal  ESTIMATED BLOOD LOSS:-300 ml    DRAINS: Hemovac x1.   COMPLICATIONS: None   CONDITION: PACU - hemodynamically stable.   BRIEF CLINICAL NOTE: [name] is a 70 y.o. female who has advanced end-  stage arthritis of their Right  hip with progressively worsening pain and  dysfunction.The patient has failed nonoperative management and presents for  total hip arthroplasty.   PROCEDURE IN DETAIL: After successful administration of spinal  anesthetic, the traction boots for the [facility] bed were placed on both  feet and the patient was placed onto the [facility] bed, boots placed into the leg  holders. The Right hip was then isolated from the perineum with plastic  drapes and prepped and draped in the usual sterile fashion. ASIS and  greater trochanter were marked and a oblique incision was made, starting  at about 1 cm lateral and 2 cm distal to the ASIS and coursing towards  the anterior cortex of the femur. The skin was cut with a 10 blade  through subcutaneous tissue to the level of the fascia overlying the  tensor fascia lata muscle. The fascia was then incised in line with the  incision at the junction of the anterior third and posterior 2/3rd. The  muscle was teased off the fascia and then the interval between the TFL  and the rectus was developed. The Hohmann retractor was then placed at  the top of the femoral neck over the capsule. The vessels overlying the  capsule were cauterized and the fat on top of the capsule was removed.  A Hohmann retractor was then placed anterior underneath the rectus  femoris to give exposure to the entire anterior capsule. A T-shaped   capsulotomy was performed. The edges were tagged and the femoral head  was identified.       Osteophytes are removed off the superior acetabulum.  The femoral neck was then cut in situ with an oscillating saw. Traction  was then applied to the left lower extremity utilizing the [facility]  traction. The femoral head was then removed. Retractors were placed  around the acetabulum and then circumferential removal of the labrum was  performed. Osteophytes were also removed. Reaming starts at 45 mm to  medialize and  Increased in 2 mm increments to 47 mm. We reamed in  approximately 40 degrees of abduction, 20 degrees anteversion. A 48 mm  pinnacle acetabular shell was then impacted in anatomic position under  fluoroscopic guidance with excellent purchase. We did not need to place  any additional dome screws. A 28 mm neutral + 4 marathon liner was then  placed into the acetabular shell.       The femoral lift was then placed along the lateral aspect of the femur  just distal to the vastus ridge. The leg was  externally rotated and capsule  was stripped off the inferior aspect of the femoral neck down to the  level of the lesser trochanter, this was done with electrocautery. The femur was lifted after this was performed. The  leg was then placed in an extended and adducted position essentially delivering the femur. We also removed the capsule superiorly and the piriformis from the piriformis  fossa to gain excellent exposure of the  proximal femur. Rongeur was used to remove some cancellous bone to get  into the lateral portion of the proximal femur for placement of the  initial starter reamer. The starter broaches was placed  the starter broach  and was shown to go down the center of the canal. Broaching  with the  Corail system was then performed starting at size 8, coursing  Up to size 11. A size 11 had excellent torsional and rotational  and axial stability. The trial high offset neck was then  placed  with a 28 + 8.5 trial head. The hip was then reduced. We confirmed that  the stem was in the canal both on AP and lateral x-rays. It also has excellent sizing. The hip was reduced with outstanding stability through full extension and full external rotation.. AP pelvis was taken and the leg lengths were measured and found to be equal. Hip was then dislocated again and the femoral head and neck removed. The  femoral broach was removed. Size 11 Corail stem with a high offset  neck was then impacted into the femur following native anteversion. Has  excellent purchase in the canal. Excellent torsional and rotational and  axial stability. It is confirmed to be in the canal on AP and lateral  fluoroscopic views. The 28 + 8.5 ceramic head was placed and the hip  reduced with outstanding stability. Again AP pelvis was taken and it  confirmed that the leg lengths were equal. The wound was then copiously  irrigated with saline solution and the capsule reattached and repaired  with Ethibond suture. 30 ml of .25% Bupivicaine was  injected into the capsule and into the edge of the tensor fascia lata as well as subcutaneous tissue. The fascia overlying the tensor fascia lata was then closed with a running #1 V-Loc. Subcu was closed with interrupted 2-0 Vicryl and subcuticular running 4-0 Monocryl. Incision was cleaned  and dried. Steri-Strips and a bulky sterile dressing applied. Hemovac  drain was hooked to suction and then the patient was awakened and transported to  recovery in stable condition.        Please note that a surgical assistant was a medical necessity for this procedure to perform it in a safe and expeditious manner. Assistant was necessary to provide appropriate retraction of vital neurovascular structures and to prevent femoral fracture and allow for anatomic placement of the prosthesis.  Gaynelle Arabian, M.D.

## 2015-11-29 NOTE — Interval H&P Note (Signed)
History and Physical Interval Note:  11/29/2015 3:14 PM  [name]  has presented today for surgery, with the diagnosis of right hip osteoarthritis  The various methods of treatment have been discussed with the patient and family. After consideration of risks, benefits and other options for treatment, the patient has consented to  Procedure(s): RIGHT TOTAL HIP ARTHROPLASTY ANTERIOR APPROACH (Right) as a surgical intervention .  The patient's history has been reviewed, patient examined, no change in status, stable for surgery.  I have reviewed the patient's chart and labs.  Questions were answered to the patient's satisfaction.     [name]

## 2015-11-29 NOTE — Anesthesia Procedure Notes (Addendum)
Spinal  Patient location during procedure: OR Start time: 11/29/2015 3:37 PM End time: 11/29/2015 3:41 PM Staffing Anesthesiologist: Lillia Abed Resident/CRNA: Darlys Gales R Performed: resident/CRNA  Preanesthetic Checklist Completed: patient identified, site marked, surgical consent, pre-op evaluation, timeout performed, IV checked, risks and benefits discussed and monitors and equipment checked Spinal Block Patient position: sitting Prep: ChloraPrep Patient monitoring: heart rate, continuous pulse ox and blood pressure Approach: midline Location: L3-4 Needle Needle type: Pencan  Needle gauge: 24 G Needle length: 10 cm Needle insertion depth: 7 cm Assessment Sensory level: T6

## 2015-11-30 ENCOUNTER — Encounter (HOSPITAL_COMMUNITY): Payer: Self-pay | Admitting: Orthopedic Surgery

## 2015-11-30 LAB — CBC
HCT: 37.5 % (ref 36.0–46.0)
Hemoglobin: 12.6 g/dL (ref 12.0–15.0)
MCH: 32.4 pg (ref 26.0–34.0)
MCHC: 33.6 g/dL (ref 30.0–36.0)
MCV: 96.4 fL (ref 78.0–100.0)
PLATELETS: 198 10*3/uL (ref 150–400)
RBC: 3.89 MIL/uL (ref 3.87–5.11)
RDW: 12.8 % (ref 11.5–15.5)
WBC: 9.1 10*3/uL (ref 4.0–10.5)

## 2015-11-30 LAB — BASIC METABOLIC PANEL
ANION GAP: 7 (ref 5–15)
BUN: 12 mg/dL (ref 6–20)
CALCIUM: 9 mg/dL (ref 8.9–10.3)
CO2: 25 mmol/L (ref 22–32)
Chloride: 107 mmol/L (ref 101–111)
Creatinine, Ser: 0.72 mg/dL (ref 0.44–1.00)
GFR calc Af Amer: >60 mL/min (ref >60)
GLUCOSE: 159 mg/dL — AB (ref 65–99)
POTASSIUM: 4.7 mmol/L (ref 3.5–5.1)
SODIUM: 139 mmol/L (ref 135–145)

## 2015-11-30 NOTE — Evaluation (Signed)
Occupational Therapy Evaluation Patient Details Name: [name] MRN: [id number] DOB: 12-31-1945 Today's Date: 11/30/2015    History of Present Illness 70 yo female s/p R THA-DA 11/29/15   Clinical Impression   Pt was admitted for the above surgery. All education was completed. No further OT is needed at this time    Follow Up Recommendations  No OT follow up    Equipment Recommendations  None recommended by OT (pt has high commode, safety frame and built in shower seat)    Recommendations for Other Services       Precautions / Restrictions Precautions Precautions: Fall Restrictions Weight Bearing Restrictions: No RLE Weight Bearing: Weight bearing as tolerated      Mobility Bed Mobility Overal bed mobility: Needs Assistance Bed Mobility: Supine to Sit     Supine to sit: Min assist     General bed mobility comments: oob  Transfers Overall transfer level: Needs assistance Equipment used: Rolling walker (2 wheeled) Transfers: Sit to/from Stand Sit to Stand: Supervision         General transfer comment: cues for UE placement    Balance                                            ADL Overall ADL's : Needs assistance/impaired     Grooming: Oral care;Supervision/safety;Standing       Lower Body Bathing: Minimal assistance;Sit to/from stand       Lower Body Dressing: Minimal assistance;Sit to/from stand   Toilet Transfer: Min guard;Ambulation;BSC;RW   Toileting- Clothing Manipulation and Hygiene: Supervision/safety;Sit to/from stand   Tub/ Shower Transfer: Walk-in shower;Min guard;Ambulation;3 in 1     General ADL Comments: pt can perform UB adls with set up.  Pt states that shower stall is on a little incline--to drain water.  Recommended husband stand behind her and also would have HHPT practice transfer prior to her taking shower     Vision     Perception     Praxis      Pertinent Vitals/Pain Pain Assessment:  0-10 Pain Score: 2  Pain Location: R hip Pain Descriptors / Indicators: Sore Pain Intervention(s): Limited activity within patient's tolerance;Monitored during session;Ice applied     Hand Dominance     Extremity/Trunk Assessment Upper Extremity Assessment Upper Extremity Assessment: Overall WFL for tasks assessed      Cervical / Trunk Assessment Cervical / Trunk Assessment: Normal   Communication Communication Communication: No difficulties   Cognition Arousal/Alertness: Awake/alert Behavior During Therapy: WFL for tasks assessed/performed Overall Cognitive Status: Within Functional Limits for tasks assessed                     General Comments       Exercises       Shoulder Instructions      Home Living Family/patient expects to be discharged to:: Private residence Living Arrangements: Spouse/significant other Available Help at Discharge: Family Type of Home: House Home Access: Stairs to enter Technical brewer of Steps: 5 Entrance Stairs-Rails: Right;Left Home Layout: One level     Bathroom Shower/Tub: Occupational psychologist: Standard     Home Equipment: None          Prior Functioning/Environment Level of Independence: Independent             OT Diagnosis: Acute pain   OT Problem List:  OT Treatment/Interventions:      OT Goals(Current goals can be found in the care plan section) Acute Rehab OT Goals Patient Stated Goal: regain independence  OT Frequency:     Barriers to D/C:            Co-evaluation              End of Session    Activity Tolerance: Patient tolerated treatment well Patient left: in chair;with call bell/phone within reach   Time: 1245-1305 OT Time Calculation (min): 20 min Charges:  OT General Charges $OT Visit: 1 Procedure OT Evaluation $OT Eval Low Complexity: 1 Procedure G-Codes:    Craig Wisnewski 12/13/2015, 2:22 PM  Lesle Chris,  OTR/L [phone] December 13, 2015

## 2015-11-30 NOTE — Evaluation (Signed)
Physical Therapy Evaluation Patient Details Name: [name] MRN: [id number] DOB: 10-Mar-1946 Today's Date: 11/30/2015   History of Present Illness  70 yo female s/p R THA-DA 11/29/15  Clinical Impression  On eval, pt was Min guard assist for mobility. She walked ~350 feet with a RW. Pain rated 5/10 with activity (soreness). Pt tolerated activity well.     Follow Up Recommendations Home health PT;Supervision for mobility/OOB    Equipment Recommendations  Rolling walker with 5" wheels    Recommendations for Other Services       Precautions / Restrictions Precautions Precautions: Fall Restrictions Weight Bearing Restrictions: No RLE Weight Bearing: Weight bearing as tolerated      Mobility  Bed Mobility Overal bed mobility: Needs Assistance Bed Mobility: Supine to Sit     Supine to sit: Min assist     General bed mobility comments: small amount of assist for R LE.   Transfers Overall transfer level: Needs assistance Equipment used: Rolling walker (2 wheeled) Transfers: Sit to/from Stand Sit to Stand: Min guard         General transfer comment: close guard for safety. VCs safety, hand placement  Ambulation/Gait Ambulation/Gait assistance: Min guard Ambulation Distance (Feet): 350 Feet Assistive device: Rolling walker (2 wheeled) Gait Pattern/deviations: Step-through pattern;Decreased stride length     General Gait Details: close guard for safety  Stairs            Wheelchair Mobility    Modified Rankin (Stroke Patients Only)       Balance                                             Pertinent Vitals/Pain Pain Assessment: 0-10 Pain Score: 5  Pain Location: R hip/thigh Pain Descriptors / Indicators: Sore Pain Intervention(s): Ice applied;Repositioned    Home Living Family/patient expects to be discharged to:: Private residence Living Arrangements: Spouse/significant other Available Help at Discharge: Family Type of  Home: House Home Access: Stairs to enter Entrance Stairs-Rails: Psychiatric nurse of Steps: 5 Home Layout: One level Home Equipment: None      Prior Function Level of Independence: Independent               Hand Dominance        Extremity/Trunk Assessment               Lower Extremity Assessment: Generalized weakness      Cervical / Trunk Assessment: Normal  Communication   Communication: No difficulties  Cognition Arousal/Alertness: Awake/alert Behavior During Therapy: WFL for tasks assessed/performed Overall Cognitive Status: Within Functional Limits for tasks assessed                      General Comments      Exercises Total Joint Exercises Ankle Circles/Pumps: AROM;Both;10 reps;Supine Quad Sets: 10 reps;AROM;Both;Supine Heel Slides: AAROM;Right;10 reps;Supine Hip ABduction/ADduction: AAROM;Right;10 reps;Supine      Assessment/Plan    PT Assessment Patient needs continued PT services  PT Diagnosis Difficulty walking;Acute pain;Generalized weakness   PT Problem List Decreased strength;Decreased mobility;Decreased range of motion;Decreased activity tolerance;Decreased balance;Pain;Decreased knowledge of use of DME  PT Treatment Interventions Gait training;Stair training;Functional mobility training;DME instruction;Therapeutic activities;Therapeutic exercise;Balance training;Patient/family education   PT Goals (Current goals can be found in the Care Plan section) Acute Rehab PT Goals Patient Stated Goal: regain independence PT Goal Formulation: With patient/family  Time For Goal Achievement: 12/14/15 Potential to Achieve Goals: Good    Frequency 7X/week   Barriers to discharge        Co-evaluation               End of Session   Activity Tolerance: Patient tolerated treatment well Patient left: in chair;with call bell/phone within reach           Time: 0945-0958 PT Time Calculation (min) (ACUTE ONLY):  13 min   Charges:   PT Evaluation $PT Eval Low Complexity: 1 Procedure     PT G Codes:        Weston Anna, MPT Pager: [phone]

## 2015-11-30 NOTE — Discharge Instructions (Addendum)
° °Dr. Frank [name] °Total Joint Specialist °[facility] °[address] °[place], [place] [address] °[phone] ° °ANTERIOR APPROACH TOTAL HIP REPLACEMENT POSTOPERATIVE DIRECTIONS ° ° °Hip Rehabilitation, Guidelines Following Surgery  °The results of a hip operation are greatly improved after range of motion and muscle strengthening exercises. Follow all safety measures which are given to protect your hip. If any of these exercises cause increased pain or swelling in your joint, decrease the amount until you are comfortable again. Then slowly increase the exercises. Call your caregiver if you have problems or questions.  ° °HOME CARE INSTRUCTIONS  °Remove items at home which could result in a fall. This includes throw rugs or furniture in walking pathways.  °· ICE to the affected hip every three hours for 30 minutes at a time and then as needed for pain and swelling.  Continue to use ice on the hip for pain and swelling from surgery. You may notice swelling that will progress down to the foot and ankle.  This is normal after surgery.  Elevate the leg when you are not up walking on it.   °· Continue to use the breathing machine which will help keep your temperature down.  It is common for your temperature to cycle up and down following surgery, especially at night when you are not up moving around and exerting yourself.  The breathing machine keeps your lungs expanded and your temperature down. ° ° °DIET °You may resume your previous home diet once your are discharged from the hospital. ° °DRESSING / WOUND CARE / SHOWERING °You may shower 3 days after surgery, but keep the wounds dry during showering.  You may use an occlusive plastic wrap (Press'n Seal for example), NO SOAKING/SUBMERGING IN THE BATHTUB.  If the bandage gets wet, change with a clean dry gauze.  If the incision gets wet, pat the wound dry with a clean towel. °You may start showering once you are discharged home but do not  submerge the incision under water. Just pat the incision dry and apply a dry gauze dressing on daily. °Change the surgical dressing daily and reapply a dry dressing each time. ° °ACTIVITY °Walk with your walker as instructed. °Use walker as long as suggested by your caregivers. °Avoid periods of inactivity such as sitting longer than an hour when not asleep. This helps prevent blood clots.  °You may resume a sexual relationship in one month or when given the OK by your doctor.  °You may return to work once you are cleared by your doctor.  °Do not drive a car for 6 weeks or until released by you surgeon.  °Do not drive while taking narcotics. ° °WEIGHT BEARING °Weight bearing as tolerated with assist device (walker, cane, etc) as directed, use it as long as suggested by your surgeon or therapist, typically at least 4-6 weeks. ° °POSTOPERATIVE CONSTIPATION PROTOCOL °Constipation - defined medically as fewer than three stools per week and severe constipation as less than one stool per week. ° °One of the most common issues patients have following surgery is constipation.  Even if you have a regular bowel pattern at home, your normal regimen is likely to be disrupted due to multiple reasons following surgery.  Combination of anesthesia, postoperative narcotics, change in appetite and fluid intake all can affect your bowels.  In order to avoid complications following surgery, here are some recommendations in order to help you during your recovery period. ° °Colace (docusate) - Pick up an over-the-counter   form of Colace or another stool softener and take twice a day as long as you are requiring postoperative pain medications.  Take with a full glass of water daily.  If you experience loose stools or diarrhea, hold the colace until you stool forms back up.  If your symptoms do not get better within 1 week or if they get worse, check with your doctor. ° °Dulcolax (bisacodyl) - Pick up over-the-counter and take as directed  by the product packaging as needed to assist with the movement of your bowels.  Take with a full glass of water.  Use this product as needed if not relieved by Colace only.  ° °MiraLax (polyethylene glycol) - Pick up over-the-counter to have on hand.  MiraLax is a solution that will increase the amount of water in your bowels to assist with bowel movements.  Take as directed and can mix with a glass of water, juice, soda, coffee, or tea.  Take if you go more than two days without a movement. °Do not use MiraLax more than once per day. Call your doctor if you are still constipated or irregular after using this medication for 7 days in a row. ° °If you continue to have problems with postoperative constipation, please contact the office for further assistance and recommendations.  If you experience "the worst abdominal pain ever" or develop nausea or vomiting, please contact the office immediatly for further recommendations for treatment. ° °ITCHING ° If you experience itching with your medications, try taking only a single pain pill, or even half a pain pill at a time.  You can also use Benadryl over the counter for itching or also to help with sleep.  ° °TED HOSE STOCKINGS °Wear the elastic stockings on both legs for three weeks following surgery during the day but you may remove then at night for sleeping. ° °MEDICATIONS °See your medication summary on the “After Visit Summary” that the nursing staff will review with you prior to discharge.  You may have some home medications which will be placed on hold until you complete the course of blood thinner medication.  It is important for you to complete the blood thinner medication as prescribed by your surgeon.  Continue your approved medications as instructed at time of discharge. ° °PRECAUTIONS °If you experience chest pain or shortness of breath - call 911 immediately for transfer to the hospital emergency department.  °If you develop a fever greater that 101 F,  purulent drainage from wound, increased redness or drainage from wound, foul odor from the wound/dressing, or calf pain - CONTACT YOUR SURGEON.   °                                                °FOLLOW-UP APPOINTMENTS °Make sure you keep all of your appointments after your operation with your surgeon and caregivers. You should call the office at the above phone number and make an appointment for approximately two weeks after the date of your surgery or on the date instructed by your surgeon outlined in the "After Visit Summary". ° °RANGE OF MOTION AND STRENGTHENING EXERCISES  °These exercises are designed to help you keep full movement of your hip joint. Follow your caregiver's or physical therapist's instructions. Perform all exercises about fifteen times, three times per day or as directed. Exercise both hips, even if you   have had only one joint replacement. These exercises can be done on a training (exercise) mat, on the floor, on a table or on a bed. Use whatever works the best and is most comfortable for you. Use music or television while you are exercising so that the exercises are a pleasant break in your day. This will make your life better with the exercises acting as a break in routine you can look forward to.  Lying on your back, slowly slide your foot toward your buttocks, raising your knee up off the floor. Then slowly slide your foot back down until your leg is straight again.  Lying on your back spread your legs as far apart as you can without causing discomfort.  Lying on your side, raise your upper leg and foot straight up from the floor as far as is comfortable. Slowly lower the leg and repeat.  Lying on your back, tighten up the muscle in the front of your thigh (quadriceps muscles). You can do this by keeping your leg straight and trying to raise your heel off the floor. This helps strengthen the largest muscle supporting your knee.  Lying on your back, tighten up the muscles of your  buttocks both with the legs straight and with the knee bent at a comfortable angle while keeping your heel on the floor.   IF YOU ARE TRANSFERRED TO A SKILLED REHAB FACILITY If the patient is transferred to a skilled rehab facility following release from the hospital, a list of the current medications will be sent to the facility for the patient to continue.  When discharged from the skilled rehab facility, please have the facility set up the patient's [place] prior to being released. Also, the skilled facility will be responsible for providing the patient with their medications at time of release from the facility to include their pain medication, the muscle relaxants, and their blood thinner medication. If the patient is still at the rehab facility at time of the two week follow up appointment, the skilled rehab facility will also need to assist the patient in arranging follow up appointment in our office and any transportation needs.  MAKE SURE YOU:  Understand these instructions.  Get help right away if you are not doing well or get worse.    Pick up stool softner and laxative for home use following surgery while on pain medications. Do not submerge incision under water. Please use good hand washing techniques while changing dressing each day. May shower starting three days after surgery. Please use a clean towel to pat the incision dry following showers. Continue to use ice for pain and swelling after surgery. Do not use any lotions or creams on the incision until instructed by your surgeon.  Take Xarelto for two and a half more weeks, then discontinue Xarelto. Once the patient has completed the blood thinner regimen, then take a Baby 81 mg Aspirin daily for three more weeks.   Information on my medicine - XARELTO (Rivaroxaban)  This medication education was reviewed with me or my healthcare representative as part of my discharge preparation.  The pharmacist that  spoke with me during my hospital stay was:  Altha Harm  Why was Xarelto prescribed for you? Xarelto was prescribed for you to reduce the risk of blood clots forming after orthopedic surgery. The medical term for these abnormal blood clots is venous thromboembolism (VTE).  What do you need to know about xarelto ? Take your Xarelto ONCE DAILY at  the same time every day. You may take it either with or without food.  If you have difficulty swallowing the tablet whole, you may crush it and mix in applesauce just prior to taking your dose.  Take Xarelto exactly as prescribed by your doctor and DO NOT stop taking Xarelto without talking to the doctor who prescribed the medication.  Stopping without other VTE prevention medication to take the place of Xarelto may increase your risk of developing a clot.  After discharge, you should have regular check-up appointments with your healthcare provider that is prescribing your Xarelto.    What do you do if you miss a dose? If you miss a dose, take it as soon as you remember on the same day then continue your regularly scheduled once daily regimen the next day. Do not take two doses of Xarelto on the same day.   Important Safety Information A possible side effect of Xarelto is bleeding. You should call your healthcare provider right away if you experience any of the following: ? Bleeding from an injury or your nose that does not stop. ? Unusual colored urine (red or dark brown) or unusual colored stools (red or black). ? Unusual bruising for unknown reasons. ? A serious fall or if you hit your head (even if there is no bleeding).  Some medicines may interact with Xarelto and might increase your risk of bleeding while on Xarelto. To help avoid this, consult your healthcare provider or pharmacist prior to using any new prescription or non-prescription medications, including herbals, vitamins, non-steroidal anti-inflammatory drugs (NSAIDs) and  supplements.  This website has more information on Xarelto: [email or web address]

## 2015-11-30 NOTE — Progress Notes (Signed)
At approximately 1650 this afternoon, patient was not easily aroused. Sternal rub was performed and patient came to but was pale and diaphoretic. Vital signs were BP: 60/35, HR: 49. Fluids were restarted and patient was encouraged to drink. Vital signs were rechecked 30 minutes later; BP: 112/64, HR: 66. Patient's color returned and she felt much better. Fluids were stopped. Patient attributed this incident to viewing her dressing where Hemovac was removed that was soaked in blood. New dressing applied, no drainage presently.

## 2015-11-30 NOTE — Progress Notes (Signed)
Stress incontinence

## 2015-11-30 NOTE — Progress Notes (Signed)
   Subjective: 1 Day Post-Op Procedure(s) (LRB): RIGHT TOTAL HIP ARTHROPLASTY ANTERIOR APPROACH (Right) Patient reports pain as mild.   Patient seen in rounds with Dr. Wynelle Link. Patient is well, but has had some minor complaints of pain in the hip, requiring pain medications We will start therapy today.  Plan is to go Home after hospital stay.  Plan for tomorrow.  Objective: Vital signs in last 24 hours: Temp:  [97.5 F (36.4 C)-98.2 F (36.8 C)] 98 F (36.7 C) (09/14 0500) Pulse Rate:  [63-99] 64 (09/14 0500) Resp:  [13-19] 16 (09/14 0500) BP: (103-169)/(55-100) 125/74 (09/14 0500) SpO2:  [97 %-100 %] 100 % (09/14 0500) Weight:  [84.8 kg (187 lb)] 84.8 kg (187 lb) (09/13 1302)  Intake/Output from previous day:  Intake/Output Summary (Last 24 hours) at 11/30/15 0742 Last data filed at 11/30/15 0600  Gross per 24 hour  Intake           4472.5 ml  Output             3410 ml  Net           1062.5 ml    Intake/Output this shift: No intake/output data recorded.  Labs:  Recent Labs  11/30/15 0402  HGB 12.6    Recent Labs  11/30/15 0402  WBC 9.1  RBC 3.89  HCT 37.5  PLT 198    Recent Labs  11/30/15 0402  NA 139  K 4.7  CL 107  CO2 25  BUN 12  CREATININE 0.72  GLUCOSE 159*  CALCIUM 9.0   No results for input(s): LABPT, INR in the last 72 hours.  EXAM General - Patient is Alert, Appropriate and Oriented Extremity - Neurovascular intact Sensation intact distally Dorsiflexion/Plantar flexion intact Dressing - dressing C/D/I Motor Function - intact, moving foot and toes well on exam.  Hemovac pulled without difficulty.  Past Medical History:  Diagnosis Date  . Arthritis   . GERD (gastroesophageal reflux disease)    mild- TUMS used sparingly  . Hyperlipidemia   . Hypertension   . PONV (postoperative nausea and vomiting)    also "passed out with cyst removal from neck"superficial"  . Syncope    x2 episodes-evaluated and findings normal- "usually  related to seeing blood or thought of it"    Assessment/Plan: 1 Day Post-Op Procedure(s) (LRB): RIGHT TOTAL HIP ARTHROPLASTY ANTERIOR APPROACH (Right) Principal Problem:   OA (osteoarthritis) of hip  Estimated body mass index is 30.65 kg/m as calculated from the following:   Height as of this encounter: 5' 5.5" (1.664 m).   Weight as of this encounter: 84.8 kg (187 lb). Advance diet Up with therapy Plan for discharge tomorrow Discharge home with home health  DVT Prophylaxis - Xarelto Weight Bearing As Tolerated right Leg Hemovac Pulled Begin Therapy  Arlee Muslim, PA-C Orthopaedic Surgery 11/30/2015, 7:42 AM

## 2015-11-30 NOTE — Progress Notes (Signed)
Physical Therapy Treatment Patient Details Name: [name] MRN: [id number] DOB: [birth year] Today's Date: 11/30/2015    History of Present Illness 70 yo female s/p R THA-DA 11/29/15    PT Comments    POD # 1 pm session Assisted with amb in hallway and practiced stairs.  Pt progressing well and plans to D/C to home tomorrow.   Follow Up Recommendations  Home health PT;Supervision for mobility/OOB     Equipment Recommendations  Rolling walker with 5" wheels    Recommendations for Other Services       Precautions / Restrictions Precautions Precautions: Fall Restrictions Weight Bearing Restrictions: No RLE Weight Bearing: Weight bearing as tolerated    Mobility  Bed Mobility               General bed mobility comments: oob  Transfers Overall transfer level: Needs assistance Equipment used: Rolling walker (2 wheeled) Transfers: Sit to/from Stand Sit to Stand: Supervision         General transfer comment: one VC safety with turns  Ambulation/Gait Ambulation/Gait assistance: Supervision Ambulation Distance (Feet): 425 Feet Assistive device: Rolling walker (2 wheeled) Gait Pattern/deviations: Step-through pattern     General Gait Details: close guard for safety   Stairs Stairs: Yes Stairs assistance: Supervision Stair Management: One rail Right;Step to pattern;Forwards Number of Stairs: 4 General stair comments: one intial VC on sequencing and safety  Wheelchair Mobility    Modified Rankin (Stroke Patients Only)       Balance                                    Cognition Arousal/Alertness: Awake/alert Behavior During Therapy: WFL for tasks assessed/performed Overall Cognitive Status: Within Functional Limits for tasks assessed                      Exercises      General Comments        Pertinent Vitals/Pain Pain Assessment: 0-10 Pain Score: 2  Pain Location: R hip Pain Descriptors / Indicators:  Aching;Discomfort;Tender Pain Intervention(s): Monitored during session;Repositioned;Ice applied    Home Living Family/patient expects to be discharged to:: Private residence Living Arrangements: Spouse/significant other Available Help at Discharge: Family         Home Equipment: None      Prior Function Level of Independence: Independent          PT Goals (current goals can now be found in the care plan section) Acute Rehab PT Goals Patient Stated Goal: regain independence Progress towards PT goals: Progressing toward goals    Frequency  7X/week    PT Plan Current plan remains appropriate    Co-evaluation             End of Session Equipment Utilized During Treatment: Gait belt Activity Tolerance: Patient tolerated treatment well Patient left: in chair     Time: 1342-1400 PT Time Calculation (min) (ACUTE ONLY): 18 min  Charges:  $Gait Training: 8-22 mins                    G Codes:      Rica Koyanagi  PTA [facility]  Acute  Rehab Pager      [phone]

## 2015-11-30 NOTE — Care Management Note (Signed)
Case Management Note  Patient Details  Name: RONISHA HERRINGSHAW MRN: [id number] Date of Birth: 04-Mar-1946  Subjective/Objective:                  RIGHT TOTAL HIP ARTHROPLASTY ANTERIOR APPROACH (Right) Action/Plan: Discharge planning Expected Discharge Date:  11/30/15              Expected Discharge Plan:  [place]  In-House Referral:     Discharge planning Services  CM Consult  Post Acute Care Choice:  Home Health Choice offered to:  Patient  DME Arranged:  Walker rolling DME Agency:  [place]:  PT [place] Agency:  [facility] (formerly [facility])  Status of Service:  Completed, signed off  If discussed at [employer] of Stay Meetings, dates discussed:    Additional Comments: CM met with pt in room to offer choice of home health agency. Pt chooses [facility] to render HHPT.  Referral given to [facility] rep, Tim.  Cm notified [place] DME rep, Jermaine to please deliver the rolling walker to room prior to discharge.  No other CM needs were communicated.   Dellie Catholic, RN 11/30/2015, 1:01 PM

## 2015-12-01 LAB — BASIC METABOLIC PANEL
ANION GAP: 6 (ref 5–15)
BUN: 15 mg/dL (ref 6–20)
CALCIUM: 9 mg/dL (ref 8.9–10.3)
CO2: 27 mmol/L (ref 22–32)
Chloride: 107 mmol/L (ref 101–111)
Creatinine, Ser: 0.74 mg/dL (ref 0.44–1.00)
GFR calc Af Amer: >60 mL/min (ref >60)
Glucose, Bld: 119 mg/dL — ABNORMAL HIGH (ref 65–99)
POTASSIUM: 3.9 mmol/L (ref 3.5–5.1)
SODIUM: 140 mmol/L (ref 135–145)

## 2015-12-01 LAB — CBC
HCT: 35 % — ABNORMAL LOW (ref 36.0–46.0)
Hemoglobin: 11.5 g/dL — ABNORMAL LOW (ref 12.0–15.0)
MCH: 31.7 pg (ref 26.0–34.0)
MCHC: 32.9 g/dL (ref 30.0–36.0)
MCV: 96.4 fL (ref 78.0–100.0)
PLATELETS: 195 10*3/uL (ref 150–400)
RBC: 3.63 MIL/uL — AB (ref 3.87–5.11)
RDW: 13 % (ref 11.5–15.5)
WBC: 13.6 10*3/uL — AB (ref 4.0–10.5)

## 2015-12-01 MED ORDER — ONDANSETRON HCL 4 MG PO TABS: 4.0000 mg | ORAL_TABLET | Freq: Four times a day (QID) | ORAL | 0 refills | Status: DC | PRN

## 2015-12-01 MED ORDER — TRAMADOL HCL 50 MG PO TABS: 50.0000 mg | ORAL_TABLET | Freq: Four times a day (QID) | ORAL | 1 refills | Status: DC | PRN

## 2015-12-01 MED ORDER — METHOCARBAMOL 500 MG PO TABS: 500.0000 mg | ORAL_TABLET | Freq: Four times a day (QID) | ORAL | 0 refills | Status: DC | PRN

## 2015-12-01 MED ORDER — OXYCODONE HCL 5 MG PO TABS: 5.0000 mg | ORAL_TABLET | ORAL | 0 refills | Status: DC | PRN

## 2015-12-01 MED ORDER — RIVAROXABAN 10 MG PO TABS: 10.0000 mg | ORAL_TABLET | Freq: Every day | ORAL | 0 refills | Status: DC

## 2015-12-01 NOTE — Progress Notes (Signed)
Physical Therapy Treatment Patient Details Name: [name] MRN: [id number] DOB: [birth year] Today's Date: 12/01/2015    History of Present Illness 70 yo female s/p R THA-DA 11/29/15    PT Comments    POD # 2 Assisted OOB to amb in hallway.  Practiced stairs with spouse then returned to room to perform THR TE's following HEP handout.  Instructed on proper tech and use of ICE.    Follow Up Recommendations  Home health PT;Supervision for mobility/OOB     Equipment Recommendations  Rolling walker with 5" wheels    Recommendations for Other Services       Precautions / Restrictions Precautions Precautions: Fall Restrictions Weight Bearing Restrictions: No RLE Weight Bearing: Weight bearing as tolerated    Mobility  Bed Mobility Overal bed mobility: Needs Assistance Bed Mobility: Supine to Sit;Sit to Supine     Supine to sit: Supervision Sit to supine: Supervision   General bed mobility comments: increased time   Transfers Overall transfer level: Needs assistance Equipment used: Rolling walker (2 wheeled) Transfers: Sit to/from Stand Sit to Stand: Supervision         General transfer comment: one VC safety with turns  Ambulation/Gait Ambulation/Gait assistance: Supervision Ambulation Distance (Feet): 255 Feet Assistive device: Rolling walker (2 wheeled) Gait Pattern/deviations: Step-through pattern     General Gait Details: close guard for safety.  good alternating gait and speed   Stairs Stairs: Yes Stairs assistance: Supervision Stair Management: One rail Right;Step to pattern;Forwards Number of Stairs: 4 General stair comments: with spouse  Wheelchair Mobility    Modified Rankin (Stroke Patients Only)       Balance                                    Cognition Arousal/Alertness: Awake/alert Behavior During Therapy: WFL for tasks assessed/performed Overall Cognitive Status: Within Functional Limits for tasks assessed                       Exercises   Total Hip Replacement TE's 10 reps ankle pumps 10 reps knee presses 10 reps heel slides 10 reps SAQ's 10 reps ABD Followed by ICE     General Comments        Pertinent Vitals/Pain Pain Assessment: 0-10 Pain Score: 3  Pain Location: R hip Pain Descriptors / Indicators: Discomfort;Aching Pain Intervention(s): Monitored during session;Repositioned;Ice applied    Home Living                      Prior Function            PT Goals (current goals can now be found in the care plan section) Progress towards PT goals: Progressing toward goals    Frequency   PT Diagnosis  7X/week   Other abnormalities of gait and mobility  OA (osteoarthritis) of hip - Plan: Weight bearing as tolerated  S/P hip replacement - Plan: DG Pelvis Portable, DG Pelvis Portable     PT Plan Current plan remains appropriate    Co-evaluation             End of Session Equipment Utilized During Treatment: Gait belt Activity Tolerance: Patient tolerated treatment well Patient left: in bed;with family/visitor present     Time: [id number] PT Time Calculation (min) (ACUTE ONLY): 27 min  Charges:  $Gait Training: 8-22 mins $Therapeutic Exercise: 8-22 mins  G Codes:      Rica Koyanagi  PTA [facility]  Acute  Rehab  Pager      [phone]

## 2015-12-01 NOTE — Progress Notes (Signed)
   Subjective: 2 Days Post-Op Procedure(s) (LRB): RIGHT TOTAL HIP ARTHROPLASTY ANTERIOR APPROACH (Right) Patient reports pain as mild.   Patient seen in rounds by Dr. Wynelle Link. Events reviewed from yesterday.  Patient doing well this morning and up walking the hallway. Patient is well, and has had no acute complaints or problems Patient is ready to go home today.  Objective: Vital signs in last 24 hours: Temp:  [97.7 F (36.5 C)-98.8 F (37.1 C)] 98.8 F (37.1 C) (09/15 0508) Pulse Rate:  [49-80] 73 (09/15 0508) Resp:  [12-18] 16 (09/15 0508) BP: (60-120)/(35-70) 107/59 (09/15 0508) SpO2:  [93 %-96 %] 96 % (09/15 0508)  Intake/Output from previous day:  Intake/Output Summary (Last 24 hours) at 12/01/15 0959 Last data filed at 12/01/15 0600  Gross per 24 hour  Intake              540 ml  Output             1700 ml  Net            -1160 ml    Intake/Output this shift: No intake/output data recorded.  Labs:  Recent Labs  11/30/15 0402 12/01/15 0416  HGB 12.6 11.5*    Recent Labs  11/30/15 0402 12/01/15 0416  WBC 9.1 13.6*  RBC 3.89 3.63*  HCT 37.5 35.0*  PLT 198 195    Recent Labs  11/30/15 0402 12/01/15 0416  NA 139 140  K 4.7 3.9  CL 107 107  CO2 25 27  BUN 12 15  CREATININE 0.72 0.74  GLUCOSE 159* 119*  CALCIUM 9.0 9.0   No results for input(s): LABPT, INR in the last 72 hours.  EXAM: General - Patient is Alert, Appropriate and Oriented Extremity - Neurovascular intact Sensation intact distally Dorsiflexion/Plantar flexion intact Incision - clean, dry, no drainage Motor Function - intact, moving foot and toes well on exam.   Assessment/Plan: 2 Days Post-Op Procedure(s) (LRB): RIGHT TOTAL HIP ARTHROPLASTY ANTERIOR APPROACH (Right) Procedure(s) (LRB): RIGHT TOTAL HIP ARTHROPLASTY ANTERIOR APPROACH (Right) Past Medical History:  Diagnosis Date  . Arthritis   . GERD (gastroesophageal reflux disease)    mild- TUMS used sparingly  .  Hyperlipidemia   . Hypertension   . PONV (postoperative nausea and vomiting)    also "passed out with cyst removal from neck"superficial"  . Syncope    x2 episodes-evaluated and findings normal- "usually related to seeing blood or thought of it"   Principal Problem:   OA (osteoarthritis) of hip  Estimated body mass index is 30.65 kg/m as calculated from the following:   Height as of this encounter: 5' 5.5" (1.664 m).   Weight as of this encounter: 84.8 kg (187 lb). Up with therapy Discharge home with home health Diet - Cardiac diet Follow up - in 2 weeks Activity - WBAT Disposition - Home Condition Upon Discharge - Good D/C Meds - See DC Summary DVT Prophylaxis - Xarelto  Arlee Muslim, PA-C Orthopaedic Surgery 12/01/2015, 9:59 AM

## 2015-12-01 NOTE — Discharge Summary (Signed)
Physician Discharge Summary   Patient ID: [name] MRN: [id number] DOB/AGE: May 05, 1945 70 y.o.  Admit date: 11/29/2015 Discharge date: 12/01/2015  Primary Diagnosis:  Osteoarthritis of the Right hip.   Admission Diagnoses:  Past Medical History:  Diagnosis Date  . Arthritis   . GERD (gastroesophageal reflux disease)    mild- TUMS used sparingly  . Hyperlipidemia   . Hypertension   . PONV (postoperative nausea and vomiting)    also "passed out with cyst removal from neck"superficial"  . Syncope    x2 episodes-evaluated and findings normal- "usually related to seeing blood or thought of it"   Discharge Diagnoses:   Principal Problem:   OA (osteoarthritis) of hip  Estimated body mass index is 30.65 kg/m as calculated from the following:   Height as of this encounter: 5' 5.5" (1.664 m).   Weight as of this encounter: 84.8 kg (187 lb).  Procedure(s) (LRB): RIGHT TOTAL HIP ARTHROPLASTY ANTERIOR APPROACH (Right)   Consults: None  HPI: [name] is a 70 y.o. female who has advanced end-  stage arthritis of their Right  hip with progressively worsening pain and  dysfunction.The patient has failed nonoperative management and presents for  total hip arthroplasty.   Laboratory Data: Admission on 11/29/2015  Component Date Value Ref Range Status  . WBC 11/30/2015 9.1  4.0 - 10.5 K/uL Final  . RBC 11/30/2015 3.89  3.87 - 5.11 MIL/uL Final  . Hemoglobin 11/30/2015 12.6  12.0 - 15.0 g/dL Final  . HCT 11/30/2015 37.5  36.0 - 46.0 % Final  . MCV 11/30/2015 96.4  78.0 - 100.0 fL Final  . MCH 11/30/2015 32.4  26.0 - 34.0 pg Final  . MCHC 11/30/2015 33.6  30.0 - 36.0 g/dL Final  . RDW 11/30/2015 12.8  11.5 - 15.5 % Final  . Platelets 11/30/2015 198  150 - 400 K/uL Final  . Sodium 11/30/2015 139  135 - 145 mmol/L Final  . Potassium 11/30/2015 4.7  3.5 - 5.1 mmol/L Final  . Chloride 11/30/2015 107  101 - 111 mmol/L Final  . CO2 11/30/2015 25  22 - 32 mmol/L Final  . Glucose,  Bld 11/30/2015 159* 65 - 99 mg/dL Final  . BUN 11/30/2015 12  6 - 20 mg/dL Final  . Creatinine, Ser 11/30/2015 0.72  0.44 - 1.00 mg/dL Final  . Calcium 11/30/2015 9.0  8.9 - 10.3 mg/dL Final  . GFR calc non Af Amer 11/30/2015 >60  >60 mL/min Final  . GFR calc Af Amer 11/30/2015 >60  >60 mL/min Final   Comment: (NOTE) The eGFR has been calculated using the CKD EPI equation. This calculation has not been validated in all clinical situations. eGFR's persistently <60 mL/min signify possible Chronic Kidney Disease.   . Anion gap 11/30/2015 7  5 - 15 Final  . WBC 12/01/2015 13.6* 4.0 - 10.5 K/uL Final  . RBC 12/01/2015 3.63* 3.87 - 5.11 MIL/uL Final  . Hemoglobin 12/01/2015 11.5* 12.0 - 15.0 g/dL Final  . HCT 12/01/2015 35.0* 36.0 - 46.0 % Final  . MCV 12/01/2015 96.4  78.0 - 100.0 fL Final  . MCH 12/01/2015 31.7  26.0 - 34.0 pg Final  . MCHC 12/01/2015 32.9  30.0 - 36.0 g/dL Final  . RDW 12/01/2015 13.0  11.5 - 15.5 % Final  . Platelets 12/01/2015 195  150 - 400 K/uL Final  . Sodium 12/01/2015 140  135 - 145 mmol/L Final  . Potassium 12/01/2015 3.9  3.5 - 5.1 mmol/L Final  .  Chloride 12/01/2015 107  101 - 111 mmol/L Final  . CO2 12/01/2015 27  22 - 32 mmol/L Final  . Glucose, Bld 12/01/2015 119* 65 - 99 mg/dL Final  . BUN 12/01/2015 15  6 - 20 mg/dL Final  . Creatinine, Ser 12/01/2015 0.74  0.44 - 1.00 mg/dL Final  . Calcium 12/01/2015 9.0  8.9 - 10.3 mg/dL Final  . GFR calc non Af Amer 12/01/2015 >60  >60 mL/min Final  . GFR calc Af Amer 12/01/2015 >60  >60 mL/min Final   Comment: (NOTE) The eGFR has been calculated using the CKD EPI equation. This calculation has not been validated in all clinical situations. eGFR's persistently <60 mL/min signify possible Chronic Kidney Disease.   Georgiann Hahn gap 12/01/2015 6  5 - 15 Final  Hospital Outpatient Visit on 11/22/2015  Component Date Value Ref Range Status  . MRSA, PCR 11/22/2015 NEGATIVE  NEGATIVE Final  . Staphylococcus aureus  11/22/2015 NEGATIVE  NEGATIVE Final   Comment:        The Xpert SA Assay (FDA approved for NASAL specimens in patients over 27 years of age), is one component of a comprehensive surveillance program.  Test performance has been validated by [facility] for patients greater than or equal to 32 year old. It is not intended to diagnose infection nor to guide or monitor treatment.   Marland Kitchen aPTT 11/22/2015 27  24 - 36 seconds Final  . WBC 11/22/2015 6.7  4.0 - 10.5 K/uL Final  . RBC 11/22/2015 4.56  3.87 - 5.11 MIL/uL Final  . Hemoglobin 11/22/2015 14.6  12.0 - 15.0 g/dL Final  . HCT 11/22/2015 43.0  36.0 - 46.0 % Final  . MCV 11/22/2015 94.3  78.0 - 100.0 fL Final  . MCH 11/22/2015 32.0  26.0 - 34.0 pg Final  . MCHC 11/22/2015 34.0  30.0 - 36.0 g/dL Final  . RDW 11/22/2015 12.6  11.5 - 15.5 % Final  . Platelets 11/22/2015 210  150 - 400 K/uL Final  . Sodium 11/22/2015 139  135 - 145 mmol/L Final  . Potassium 11/22/2015 4.2  3.5 - 5.1 mmol/L Final  . Chloride 11/22/2015 103  101 - 111 mmol/L Final  . CO2 11/22/2015 28  22 - 32 mmol/L Final  . Glucose, Bld 11/22/2015 93  65 - 99 mg/dL Final  . BUN 11/22/2015 20  6 - 20 mg/dL Final  . Creatinine, Ser 11/22/2015 0.83  0.44 - 1.00 mg/dL Final  . Calcium 11/22/2015 9.7  8.9 - 10.3 mg/dL Final  . Total Protein 11/22/2015 7.8  6.5 - 8.1 g/dL Final  . Albumin 11/22/2015 4.6  3.5 - 5.0 g/dL Final  . AST 11/22/2015 27  15 - 41 U/L Final  . ALT 11/22/2015 23  14 - 54 U/L Final  . Alkaline Phosphatase 11/22/2015 61  38 - 126 U/L Final  . Total Bilirubin 11/22/2015 0.7  0.3 - 1.2 mg/dL Final  . GFR calc non Af Amer 11/22/2015 >60  >60 mL/min Final  . GFR calc Af Amer 11/22/2015 >60  >60 mL/min Final   Comment: (NOTE) The eGFR has been calculated using the CKD EPI equation. This calculation has not been validated in all clinical situations. eGFR's persistently <60 mL/min signify possible Chronic Kidney Disease.   . Anion gap 11/22/2015 8  5 - 15  Final  . Prothrombin Time 11/22/2015 12.9  11.4 - 15.2 seconds Final  . INR 11/22/2015 0.97   Final  . ABO/RH(D) 11/29/2015 A NEG  Final  . Antibody Screen 11/29/2015 NEG   Final  . Sample Expiration 11/29/2015 12/02/2015   Final  . Extend sample reason 11/29/2015 NO TRANSFUSIONS OR PREGNANCY IN THE PAST 3 MONTHS   Final  . Color, Urine 11/22/2015 YELLOW  YELLOW Final  . APPearance 11/22/2015 CLEAR  CLEAR Final  . Specific Gravity, Urine 11/22/2015 1.017  1.005 - 1.030 Final  . pH 11/22/2015 6.0  5.0 - 8.0 Final  . Glucose, UA 11/22/2015 NEGATIVE  NEGATIVE mg/dL Final  . Hgb urine dipstick 11/22/2015 MODERATE* NEGATIVE Final  . Bilirubin Urine 11/22/2015 NEGATIVE  NEGATIVE Final  . Ketones, ur 11/22/2015 NEGATIVE  NEGATIVE mg/dL Final  . Protein, ur 11/22/2015 NEGATIVE  NEGATIVE mg/dL Final  . Nitrite 11/22/2015 NEGATIVE  NEGATIVE Final  . Leukocytes, UA 11/22/2015 SMALL* NEGATIVE Final  . Squamous Epithelial / LPF 11/22/2015 0-5* NONE SEEN Final  . WBC, UA 11/22/2015 6-30  0 - 5 WBC/hpf Final  . RBC / HPF 11/22/2015 0-5  0 - 5 RBC/hpf Final  . Bacteria, UA 11/22/2015 RARE* NONE SEEN Final  . ABO/RH(D) 11/22/2015 A NEG   Final     X-Rays:Dg Pelvis Portable  Result Date: 11/29/2015 CLINICAL DATA:  Postop from right anterior hip arthroplasty. EXAM: PORTABLE PELVIS 1-2 VIEWS COMPARISON:  None. FINDINGS: Bipolar right hip prosthesis is seen in appropriate position. No evidence of fracture or dislocation. Overlying surgical drain noted. IMPRESSION: Expected postoperative appearance of bipolar right hip prosthesis. Electronically Signed   By: Earle Gell M.D.   On: 11/29/2015 17:46   Dg C-arm 1-60 Min-no Report  Result Date: 11/29/2015 CLINICAL DATA: surgery C-ARM 1-60 MINUTES Fluoroscopy was utilized by the requesting physician.  No radiographic interpretation.    EKG: Orders placed or performed in visit on 11/22/15  . EKG 12-Lead     Hospital Course: Patient was admitted to [facility] and taken to the OR and underwent the above state procedure without complications.  Patient tolerated the procedure well and was later transferred to the recovery room and then to the orthopaedic floor for postoperative care.  They were given PO and IV analgesics for pain control following their surgery.  They were given 24 hours of postoperative antibiotics of  Anti-infectives    Start     Dose/Rate Route Frequency Ordered Stop   11/30/15 0600  ceFAZolin (ANCEF) IVPB 2g/100 mL premix     2 g 200 mL/hr over 30 Minutes Intravenous On call to O.R. 11/29/15 1411 11/29/15 1545   11/29/15 2200  ceFAZolin (ANCEF) IVPB 2g/100 mL premix     2 g 200 mL/hr over 30 Minutes Intravenous Every 6 hours 11/29/15 1841 11/30/15 0457     and started on DVT prophylaxis in the form of Xarelto.   PT and OT were ordered for total hip protocol.  The patient was allowed to be WBAT with therapy. Discharge planning was consulted to help with postop disposition and equipment needs.  Patient had a decent night on the evening of surgery.  They started to get up OOB with therapy on day one.  Hemovac drain was pulled without difficulty.  Continued to work with therapy into day two.  Dressing was changed on day two and the incision was healing well. Patient was seen in rounds and was ready to go home.  Discharge home with home health Diet - Cardiac diet Follow up - in 2 weeks Activity - WBAT Disposition - Home Condition Upon Discharge - Good D/C Meds - See  DC Summary DVT Prophylaxis - Xarelto  Discharge Instructions    Call MD / Call 911    Complete by:  As directed    If you experience chest pain or shortness of breath, CALL 911 and be transported to the hospital emergency room.  If you develope a fever above 101 F, pus (white drainage) or increased drainage or redness at the wound, or calf pain, call your surgeon's office.   Change dressing    Complete by:  As directed    You may change your dressing  dressing daily with sterile 4 x 4 inch gauze dressing and paper tape.  Do not submerge the incision under water.   Constipation Prevention    Complete by:  As directed    Drink plenty of fluids.  Prune juice may be helpful.  You may use a stool softener, such as Colace (over the counter) 100 mg twice a day.  Use MiraLax (over the counter) for constipation as needed.   Diet - low sodium heart healthy    Complete by:  As directed    Discharge instructions    Complete by:  As directed    Pick up stool softner and laxative for home use following surgery while on pain medications. Do not submerge incision under water. Please use good hand washing techniques while changing dressing each day. May shower starting three days after surgery. Please use a clean towel to pat the incision dry following showers. Continue to use ice for pain and swelling after surgery. Do not use any lotions or creams on the incision until instructed by your surgeon.   Postoperative Constipation Protocol  Constipation - defined medically as fewer than three stools per week and severe constipation as less than one stool per week.  One of the most common issues patients have following surgery is constipation.  Even if you have a regular bowel pattern at home, your normal regimen is likely to be disrupted due to multiple reasons following surgery.  Combination of anesthesia, postoperative narcotics, change in appetite and fluid intake all can affect your bowels.  In order to avoid complications following surgery, here are some recommendations in order to help you during your recovery period.  Colace (docusate) - Pick up an over-the-counter form of Colace or another stool softener and take twice a day as long as you are requiring postoperative pain medications.  Take with a full glass of water daily.  If you experience loose stools or diarrhea, hold the colace until you stool forms back up.  If your symptoms do not get better  within 1 week or if they get worse, check with your doctor.  Dulcolax (bisacodyl) - Pick up over-the-counter and take as directed by the product packaging as needed to assist with the movement of your bowels.  Take with a full glass of water.  Use this product as needed if not relieved by Colace only.   MiraLax (polyethylene glycol) - Pick up over-the-counter to have on hand.  MiraLax is a solution that will increase the amount of water in your bowels to assist with bowel movements.  Take as directed and can mix with a glass of water, juice, soda, coffee, or tea.  Take if you go more than two days without a movement. Do not use MiraLax more than once per day. Call your doctor if you are still constipated or irregular after using this medication for 7 days in a row.  If you continue to have problems with  postoperative constipation, please contact the office for further assistance and recommendations.  If you experience "the worst abdominal pain ever" or develop nausea or vomiting, please contact the office immediatly for further recommendations for treatment.   Take Xarelto for two and a half more weeks, then discontinue Xarelto. Once the patient has completed the blood thinner regimen, then take a Baby 81 mg Aspirin daily for three more weeks.   Do not sit on low chairs, stoools or toilet seats, as it may be difficult to get up from low surfaces    Complete by:  As directed    Driving restrictions    Complete by:  As directed    No driving until released by the physician.   Increase activity slowly as tolerated    Complete by:  As directed    Lifting restrictions    Complete by:  As directed    No lifting until released by the physician.   Patient may shower    Complete by:  As directed    You may shower without a dressing once there is no drainage.  Do not wash over the wound.  If drainage remains, do not shower until drainage stops.   TED hose    Complete by:  As directed    Use  stockings (TED hose) for 3 weeks on both leg(s).  You may remove them at night for sleeping.   Weight bearing as tolerated    Complete by:  As directed    Laterality:  right   Extremity:  Lower       Medication List    STOP taking these medications   Fish Oil 1200 MG Caps   Lutein-Zeaxanthin 25-5 MG Caps   naproxen sodium 220 MG tablet Commonly known as:  ANAPROX   Vitamin D 2000 units Caps     TAKE these medications   atorvastatin 80 MG tablet Commonly known as:  LIPITOR Take 80 mg by mouth every evening.   FINACEA 15 % cream Generic drug:  Azelaic Acid Apply 1 application topically 2 (two) times daily. Applied to nose.   methocarbamol 500 MG tablet Commonly known as:  ROBAXIN Take 1 tablet (500 mg total) by mouth every 6 (six) hours as needed for muscle spasms.   MICARDIS HCT 40-12.5 MG tablet Generic drug:  telmisartan-hydrochlorothiazide Take 1 tablet by mouth daily.   ondansetron 4 MG tablet Commonly known as:  ZOFRAN Take 1 tablet (4 mg total) by mouth every 6 (six) hours as needed for nausea.   oxyCODONE 5 MG immediate release tablet Commonly known as:  Oxy IR/ROXICODONE Take 1-2 tablets (5-10 mg total) by mouth every 3 (three) hours as needed for breakthrough pain.   rivaroxaban 10 MG Tabs tablet Commonly known as:  XARELTO Take 1 tablet (10 mg total) by mouth daily with breakfast. Take Xarelto for two and a half more weeks, then discontinue Xarelto. Once the patient has completed the blood thinner regimen, then take a Baby 81 mg Aspirin daily for three more weeks. Start taking on:  12/02/2015   traMADol 50 MG tablet Commonly known as:  ULTRAM Take 1-2 tablets (50-100 mg total) by mouth every 6 (six) hours as needed (mild pain).   ZETIA 10 MG tablet Generic drug:  ezetimibe Take 10 mg by mouth every evening.      Follow-up Information    [facility] .   Why:  now known as [facility].  You will receive a call from this agency  to  schedule your home health physical therapy. Contact information: [address] [place] [address] [phone]        [facility] - [facility] .   Why:  rolling walker Contact information: [address] [place] [address] [phone]        [facility] and Billing .   Why:  to discuss itemized billing Contact information: [phone]       [name], MD. Schedule an appointment as soon as possible for a visit on 12/12/2015.   Specialty:  Orthopedic Surgery Why:  Call office to setup appointment on Tuesday 12/12/2015 with Dr. Wynelle Link. Contact information: [address] [place] [address] [phone]           Signed: Arlee Muslim, PA-C Orthopaedic Surgery 12/01/2015, 10:05 AM

## 2017-03-27 ENCOUNTER — Encounter (HOSPITAL_COMMUNITY): Payer: Self-pay | Admitting: Internal Medicine

## 2017-03-27 ENCOUNTER — Other Ambulatory Visit (HOSPITAL_COMMUNITY): Payer: Self-pay | Admitting: Family Medicine

## 2017-03-27 ENCOUNTER — Ambulatory Visit (HOSPITAL_COMMUNITY)
Admission: RE | Admit: 2017-03-27 | Discharge: 2017-03-27 | Disposition: A | Discharge status: Home / Self Care | Payer: Medicare Other | Source: Ambulatory Visit | Attending: Family Medicine | Admitting: Family Medicine

## 2017-03-27 ENCOUNTER — Encounter (HOSPITAL_COMMUNITY)
Admission: EM | Disposition: A | Discharge status: Home / Self Care | Payer: Self-pay | Source: Home / Self Care | Attending: Surgery

## 2017-03-27 ENCOUNTER — Emergency Department (HOSPITAL_COMMUNITY): Payer: Medicare Other | Admitting: Anesthesiology

## 2017-03-27 ENCOUNTER — Inpatient Hospital Stay (HOSPITAL_COMMUNITY)
Admission: EM | Admit: 2017-03-27 | Discharge: 2017-03-29 | DRG: 340 | Disposition: A | Discharge status: Home / Self Care | Payer: Medicare Other | Attending: Surgery | Admitting: Surgery

## 2017-03-27 DIAGNOSIS — R1031 Right lower quadrant pain: Secondary | ICD-10-CM

## 2017-03-27 DIAGNOSIS — M1990 Unspecified osteoarthritis, unspecified site: Secondary | ICD-10-CM | POA: Diagnosis present

## 2017-03-27 DIAGNOSIS — E785 Hyperlipidemia, unspecified: Secondary | ICD-10-CM | POA: Diagnosis present

## 2017-03-27 DIAGNOSIS — K219 Gastro-esophageal reflux disease without esophagitis: Secondary | ICD-10-CM | POA: Diagnosis present

## 2017-03-27 DIAGNOSIS — Z87891 Personal history of nicotine dependence: Secondary | ICD-10-CM

## 2017-03-27 DIAGNOSIS — K3532 Acute appendicitis with perforation and localized peritonitis, without abscess: Principal | ICD-10-CM | POA: Diagnosis present

## 2017-03-27 DIAGNOSIS — K3580 Unspecified acute appendicitis: Secondary | ICD-10-CM

## 2017-03-27 DIAGNOSIS — Z96641 Presence of right artificial hip joint: Secondary | ICD-10-CM | POA: Diagnosis present

## 2017-03-27 DIAGNOSIS — Z9181 History of falling: Secondary | ICD-10-CM

## 2017-03-27 DIAGNOSIS — R55 Syncope and collapse: Secondary | ICD-10-CM | POA: Diagnosis present

## 2017-03-27 DIAGNOSIS — I10 Essential (primary) hypertension: Secondary | ICD-10-CM | POA: Diagnosis present

## 2017-03-27 DIAGNOSIS — Z803 Family history of malignant neoplasm of breast: Secondary | ICD-10-CM | POA: Diagnosis not present

## 2017-03-27 HISTORY — PX: LAPAROSCOPIC APPENDECTOMY: SHX408

## 2017-03-27 LAB — COMPREHENSIVE METABOLIC PANEL
ALBUMIN: 4.2 g/dL (ref 3.5–5.0)
ALT: 20 U/L (ref 14–54)
ANION GAP: 10 (ref 5–15)
AST: 29 U/L (ref 15–41)
Alkaline Phosphatase: 78 U/L (ref 38–126)
BUN: 11 mg/dL (ref 6–20)
CO2: 23 mmol/L (ref 22–32)
Calcium: 9.1 mg/dL (ref 8.9–10.3)
Chloride: 99 mmol/L — ABNORMAL LOW (ref 101–111)
Creatinine, Ser: 0.91 mg/dL (ref 0.44–1.00)
GFR calc Af Amer: >60 mL/min (ref >60)
GFR calc non Af Amer: >60 mL/min (ref >60)
GLUCOSE: 119 mg/dL — AB (ref 65–99)
POTASSIUM: 3.5 mmol/L (ref 3.5–5.1)
Sodium: 132 mmol/L — ABNORMAL LOW (ref 135–145)
TOTAL PROTEIN: 8.4 g/dL — AB (ref 6.5–8.1)
Total Bilirubin: 1.7 mg/dL — ABNORMAL HIGH (ref 0.3–1.2)

## 2017-03-27 LAB — CBC WITH DIFFERENTIAL/PLATELET
BASOS PCT: 0 %
Basophils Absolute: 0 10*3/uL (ref 0.0–0.1)
EOS ABS: 0 10*3/uL (ref 0.0–0.7)
EOS PCT: 0 %
HCT: 44.5 % (ref 36.0–46.0)
Hemoglobin: 15.2 g/dL — ABNORMAL HIGH (ref 12.0–15.0)
Lymphocytes Relative: 5 %
Lymphs Abs: 0.8 10*3/uL (ref 0.7–4.0)
MCH: 32.4 pg (ref 26.0–34.0)
MCHC: 34.2 g/dL (ref 30.0–36.0)
MCV: 94.9 fL (ref 78.0–100.0)
MONO ABS: 1.2 10*3/uL — AB (ref 0.1–1.0)
MONOS PCT: 8 %
Neutro Abs: 12.3 10*3/uL — ABNORMAL HIGH (ref 1.7–7.7)
Neutrophils Relative %: 87 %
Platelets: 212 10*3/uL (ref 150–400)
RBC: 4.69 MIL/uL (ref 3.87–5.11)
RDW: 13.1 % (ref 11.5–15.5)
WBC: 14.2 10*3/uL — ABNORMAL HIGH (ref 4.0–10.5)

## 2017-03-27 LAB — I-STAT CG4 LACTIC ACID, ED: Lactic Acid, Venous: 1.68 mmol/L (ref 0.5–1.9)

## 2017-03-27 SURGERY — APPENDECTOMY, LAPAROSCOPIC
Anesthesia: General | Site: Abdomen

## 2017-03-27 MED ORDER — LACTATED RINGERS IV SOLN: INTRAVENOUS | Status: DC

## 2017-03-27 MED ORDER — ONDANSETRON HCL 4 MG/2ML IJ SOLN
INTRAMUSCULAR | Status: AC
  Filled 2017-03-27: qty 2

## 2017-03-27 MED ORDER — ROCURONIUM BROMIDE 50 MG/5ML IV SOSY
PREFILLED_SYRINGE | INTRAVENOUS | Status: AC
  Filled 2017-03-27: qty 5

## 2017-03-27 MED ORDER — LIDOCAINE 2% (20 MG/ML) 5 ML SYRINGE
INTRAMUSCULAR | Status: AC
  Filled 2017-03-27: qty 5

## 2017-03-27 MED ORDER — FENTANYL CITRATE (PF) 100 MCG/2ML IJ SOLN
INTRAMUSCULAR | Status: DC | PRN
  Administered 2017-03-27 (×2): 50 ug via INTRAVENOUS
  Administered 2017-03-27: 100 ug via INTRAVENOUS

## 2017-03-27 MED ORDER — PHENYLEPHRINE 40 MCG/ML (10ML) SYRINGE FOR IV PUSH (FOR BLOOD PRESSURE SUPPORT)
PREFILLED_SYRINGE | INTRAVENOUS | Status: DC | PRN
  Administered 2017-03-27 (×2): 40 ug via INTRAVENOUS
  Administered 2017-03-27 (×2): 80 ug via INTRAVENOUS

## 2017-03-27 MED ORDER — ONDANSETRON HCL 4 MG/2ML IJ SOLN
INTRAMUSCULAR | Status: DC | PRN
  Administered 2017-03-27: 4 mg via INTRAVENOUS

## 2017-03-27 MED ORDER — PROPOFOL 10 MG/ML IV BOLUS
INTRAVENOUS | Status: AC
  Filled 2017-03-27: qty 20

## 2017-03-27 MED ORDER — IOPAMIDOL (ISOVUE-300) INJECTION 61%
100.0000 mL | Freq: Once | INTRAVENOUS | Status: AC | PRN
  Administered 2017-03-27: 100 mL via INTRAVENOUS

## 2017-03-27 MED ORDER — IOPAMIDOL (ISOVUE-300) INJECTION 61%
INTRAVENOUS | Status: AC
  Filled 2017-03-27: qty 100

## 2017-03-27 MED ORDER — IOPAMIDOL (ISOVUE-300) INJECTION 61%
INTRAVENOUS | Status: AC
  Filled 2017-03-27: qty 30

## 2017-03-27 MED ORDER — IOPAMIDOL (ISOVUE-300) INJECTION 61%
30.0000 mL | Freq: Once | INTRAVENOUS | Status: AC | PRN
  Administered 2017-03-27: 30 mL via ORAL

## 2017-03-27 MED ORDER — 0.9 % SODIUM CHLORIDE (POUR BTL) OPTIME
TOPICAL | Status: DC | PRN
  Administered 2017-03-27: 1000 mL

## 2017-03-27 MED ORDER — LACTATED RINGERS IR SOLN
Status: DC | PRN
  Administered 2017-03-27: 1000 mL

## 2017-03-27 MED ORDER — MORPHINE SULFATE (PF) 4 MG/ML IV SOLN: 4.0000 mg | Freq: Once | INTRAVENOUS | Status: DC

## 2017-03-27 MED ORDER — DEXAMETHASONE SODIUM PHOSPHATE 10 MG/ML IJ SOLN
INTRAMUSCULAR | Status: DC | PRN
  Administered 2017-03-27: 10 mg via INTRAVENOUS

## 2017-03-27 MED ORDER — SODIUM CHLORIDE 0.9 % IV SOLN
Freq: Once | INTRAVENOUS | Status: AC
  Administered 2017-03-27: 17:00:00 via INTRAVENOUS

## 2017-03-27 MED ORDER — PROPOFOL 10 MG/ML IV BOLUS
INTRAVENOUS | Status: DC | PRN
  Administered 2017-03-27: 120 mg via INTRAVENOUS

## 2017-03-27 MED ORDER — PIPERACILLIN-TAZOBACTAM 3.375 G IVPB 30 MIN
3.3750 g | Freq: Once | INTRAVENOUS | Status: AC
  Administered 2017-03-27: 3.375 g via INTRAVENOUS
  Filled 2017-03-27: qty 50

## 2017-03-27 MED ORDER — BUPIVACAINE-EPINEPHRINE 0.5% -1:200000 IJ SOLN
INTRAMUSCULAR | Status: AC
  Filled 2017-03-27: qty 1

## 2017-03-27 MED ORDER — DEXAMETHASONE SODIUM PHOSPHATE 10 MG/ML IJ SOLN
INTRAMUSCULAR | Status: AC
  Filled 2017-03-27: qty 1

## 2017-03-27 MED ORDER — SUGAMMADEX SODIUM 200 MG/2ML IV SOLN
INTRAVENOUS | Status: AC
  Filled 2017-03-27: qty 2

## 2017-03-27 MED ORDER — LACTATED RINGERS IV SOLN
INTRAVENOUS | Status: DC | PRN
  Administered 2017-03-27: 22:00:00 via INTRAVENOUS

## 2017-03-27 MED ORDER — SUGAMMADEX SODIUM 200 MG/2ML IV SOLN
INTRAVENOUS | Status: DC | PRN
  Administered 2017-03-27: 175 mg via INTRAVENOUS

## 2017-03-27 MED ORDER — ONDANSETRON HCL 4 MG/2ML IJ SOLN: 4.0000 mg | Freq: Once | INTRAMUSCULAR | Status: DC | PRN

## 2017-03-27 MED ORDER — ONDANSETRON HCL 4 MG/2ML IJ SOLN: 4.0000 mg | Freq: Once | INTRAMUSCULAR | Status: DC

## 2017-03-27 MED ORDER — ROCURONIUM BROMIDE 10 MG/ML (PF) SYRINGE
PREFILLED_SYRINGE | INTRAVENOUS | Status: DC | PRN
  Administered 2017-03-27: 40 mg via INTRAVENOUS
  Administered 2017-03-27: 10 mg via INTRAVENOUS

## 2017-03-27 MED ORDER — SODIUM CHLORIDE 0.9 % IV BOLUS (SEPSIS)
1000.0000 mL | Freq: Once | INTRAVENOUS | Status: AC
  Administered 2017-03-27: 1000 mL via INTRAVENOUS

## 2017-03-27 MED ORDER — FENTANYL CITRATE (PF) 100 MCG/2ML IJ SOLN: 25.0000 ug | INTRAMUSCULAR | Status: DC | PRN

## 2017-03-27 MED ORDER — BUPIVACAINE-EPINEPHRINE 0.5% -1:200000 IJ SOLN
INTRAMUSCULAR | Status: DC | PRN
  Administered 2017-03-27: 20 mL

## 2017-03-27 MED ORDER — FENTANYL CITRATE (PF) 100 MCG/2ML IJ SOLN
INTRAMUSCULAR | Status: AC
  Filled 2017-03-27: qty 2

## 2017-03-27 MED ORDER — LIDOCAINE 2% (20 MG/ML) 5 ML SYRINGE
INTRAMUSCULAR | Status: DC | PRN
  Administered 2017-03-27: 50 mg via INTRAVENOUS

## 2017-03-27 SURGICAL SUPPLY — 35 items
APL SKNCLS STERI-STRIP NONHPOA (GAUZE/BANDAGES/DRESSINGS)
APPLIER CLIP ROT 10 11.4 M/L (STAPLE)
BENZOIN TINCTURE PRP APPL 2/3 (GAUZE/BANDAGES/DRESSINGS) IMPLANT
CABLE HIGH FREQUENCY MONO STRZ (ELECTRODE) ×3 IMPLANT
CHLORAPREP W/TINT 26ML (MISCELLANEOUS) ×3 IMPLANT
CLIP APPLIE ROT 10 11.4 M/L (STAPLE) IMPLANT
CLOSURE WOUND 1/2 X4 (GAUZE/BANDAGES/DRESSINGS)
COVER SURGICAL LIGHT HANDLE (MISCELLANEOUS) ×3 IMPLANT
CUTTER FLEX LINEAR 45M (STAPLE) ×3 IMPLANT
DECANTER SPIKE VIAL GLASS SM (MISCELLANEOUS) ×3 IMPLANT
DERMABOND ADVANCED (GAUZE/BANDAGES/DRESSINGS) ×2
DERMABOND ADVANCED .7 DNX12 (GAUZE/BANDAGES/DRESSINGS) ×1 IMPLANT
DRAPE LAPAROSCOPIC ABDOMINAL (DRAPES) ×3 IMPLANT
ELECT REM PT RETURN 15FT ADLT (MISCELLANEOUS) ×3 IMPLANT
ENDOLOOP SUT PDS II  0 18 (SUTURE)
ENDOLOOP SUT PDS II 0 18 (SUTURE) IMPLANT
GLOVE SURG SIGNA 7.5 PF LTX (GLOVE) ×3 IMPLANT
GOWN STRL REUS W/TWL XL LVL3 (GOWN DISPOSABLE) ×6 IMPLANT
GRASPER SUT TROCAR 14GX15 (MISCELLANEOUS) ×3 IMPLANT
KIT BASIN OR (CUSTOM PROCEDURE TRAY) ×3 IMPLANT
POUCH SPECIMEN RETRIEVAL 10MM (ENDOMECHANICALS) ×3 IMPLANT
RELOAD 45 VASCULAR/THIN (ENDOMECHANICALS) IMPLANT
RELOAD STAPLE TA45 3.5 REG BLU (ENDOMECHANICALS) ×3 IMPLANT
SCISSORS LAP 5X35 DISP (ENDOMECHANICALS) ×3 IMPLANT
SET IRRIG TUBING LAPAROSCOPIC (IRRIGATION / IRRIGATOR) ×3 IMPLANT
SHEARS HARMONIC ACE PLUS 36CM (ENDOMECHANICALS) ×3 IMPLANT
SLEEVE XCEL OPT CAN 5 100 (ENDOMECHANICALS) ×3 IMPLANT
STRIP CLOSURE SKIN 1/2X4 (GAUZE/BANDAGES/DRESSINGS) IMPLANT
SUT MNCRL AB 4-0 PS2 18 (SUTURE) ×3 IMPLANT
SUT VIC AB 2-0 SH 18 (SUTURE) IMPLANT
TOWEL OR 17X26 10 PK STRL BLUE (TOWEL DISPOSABLE) ×3 IMPLANT
TOWEL OR NON WOVEN STRL DISP B (DISPOSABLE) ×3 IMPLANT
TRAY LAPAROSCOPIC (CUSTOM PROCEDURE TRAY) ×3 IMPLANT
TROCAR BLADELESS OPT 5 100 (ENDOMECHANICALS) ×3 IMPLANT
TROCAR XCEL BLUNT TIP 100MML (ENDOMECHANICALS) ×3 IMPLANT

## 2017-03-27 NOTE — ED Notes (Signed)
General surgery at the bedside. Consent for appendectomy completed.

## 2017-03-27 NOTE — Anesthesia Procedure Notes (Signed)
Procedure Name: Intubation Date/Time: 03/27/2017 10:17 PM Performed by: Anne Fu, CRNA Pre-anesthesia Checklist: Patient identified, Emergency Drugs available, Suction available, Patient being monitored and Timeout performed Patient Re-evaluated:Patient Re-evaluated prior to induction Oxygen Delivery Method: Circle system utilized Preoxygenation: Pre-oxygenation with 100% oxygen Induction Type: IV induction Ventilation: Mask ventilation without difficulty Laryngoscope Size: Mac and 4 Grade View: Grade II Tube type: Oral Tube size: 7.5 mm Number of attempts: 1 Airway Equipment and Method: Stylet Placement Confirmation: ETT inserted through vocal cords under direct vision,  positive ETCO2 and breath sounds checked- equal and bilateral Secured at: 21 cm Tube secured with: Tape Dental Injury: Teeth and Oropharynx as per pre-operative assessment

## 2017-03-27 NOTE — Transfer of Care (Signed)
Immediate Anesthesia Transfer of Care Note  Patient: [name]  Procedure(s) Performed: Procedure(s): APPENDECTOMY LAPAROSCOPIC (N/A)  Patient Location: PACU  Anesthesia Type:General  Level of Consciousness:  sedated, patient cooperative and responds to stimulation  Airway & Oxygen Therapy:Patient Spontanous Breathing and Patient connected to face mask oxgen  Post-op Assessment:  Report given to PACU RN and Post -op Vital signs reviewed and stable  Post vital signs:  Reviewed and stable  Last Vitals:  Vitals:   03/27/17 1830 03/27/17 1936  BP: 127/85 117/65  Pulse: 84 79  Resp: 20 (!) 27  Temp:    SpO2: 27% 67%    Complications: No apparent anesthesia complications

## 2017-03-27 NOTE — ED Notes (Signed)
Patient transported to CT 

## 2017-03-27 NOTE — Anesthesia Preprocedure Evaluation (Addendum)
Anesthesia Evaluation  Patient identified by MRN, date of birth, ID band Patient awake    Reviewed: Allergy & Precautions, NPO status , Patient's Chart, lab work & pertinent test results  History of Anesthesia Complications (+) PONV  Airway Mallampati: II  TM Distance: >3 FB Neck ROM: Full    Dental  (+) Dental Advisory Given   Pulmonary former smoker,    breath sounds clear to auscultation       Cardiovascular hypertension, Pt. on medications  Rhythm:Regular Rate:Normal     Neuro/Psych negative neurological ROS     GI/Hepatic Neg liver ROS, GERD  ,Acute appendicitis   Endo/Other  negative endocrine ROS  Renal/GU negative Renal ROS     Musculoskeletal  (+) Arthritis ,   Abdominal   Peds  Hematology negative hematology ROS (+)   Anesthesia Other Findings   Reproductive/Obstetrics                            Lab Results  Component Value Date   WBC 14.2 (H) 03/27/2017   HGB 15.2 (H) 03/27/2017   HCT 44.5 03/27/2017   MCV 94.9 03/27/2017   PLT 212 03/27/2017   Lab Results  Component Value Date   CREATININE 0.91 03/27/2017   BUN 11 03/27/2017   NA 132 (L) 03/27/2017   K 3.5 03/27/2017   CL 99 (L) 03/27/2017   CO2 23 03/27/2017    Anesthesia Physical Anesthesia Plan  ASA: II and emergent  Anesthesia Plan: General   Post-op Pain Management:    Induction: Intravenous  PONV Risk Score and Plan: 4 or greater and Ondansetron, Dexamethasone, Treatment may vary due to age or medical condition and Metaclopromide  Airway Management Planned: Oral ETT  Additional Equipment:   Intra-op Plan:   Post-operative Plan: Extubation in OR  Informed Consent: I have reviewed the patients History and Physical, chart, labs and discussed the procedure including the risks, benefits and alternatives for the proposed anesthesia with the patient or authorized representative who has indicated  his/her understanding and acceptance.   Dental advisory given  Plan Discussed with: CRNA  Anesthesia Plan Comments:        Anesthesia Quick Evaluation

## 2017-03-27 NOTE — ED Triage Notes (Signed)
Pt arrived to [facility] from [place] office after a positive scan for appendicitis. Pt complaining of abdominal pain that has been happening for 2 days.

## 2017-03-27 NOTE — Progress Notes (Signed)
Pt gave permission to ask questions in front of her visitor as nursing admission health history was done. She states her visitor is her husband. Lucius Conn BSN, RN-BC Admissions RN 03/27/2017 5:54 PM

## 2017-03-27 NOTE — Anesthesia Postprocedure Evaluation (Signed)
Anesthesia Post Note  Patient: [name]  Procedure(s) Performed: APPENDECTOMY LAPAROSCOPIC (N/A Abdomen)     Patient location during evaluation: PACU Anesthesia Type: General Level of consciousness: awake and alert Pain management: pain level controlled Vital Signs Assessment: post-procedure vital signs reviewed and stable Respiratory status: spontaneous breathing, nonlabored ventilation, respiratory function stable and patient connected to nasal cannula oxygen Cardiovascular status: blood pressure returned to baseline and stable Postop Assessment: no apparent nausea or vomiting Anesthetic complications: no    Last Vitals:  Vitals:   03/27/17 2336 03/27/17 2345  BP:  (!) 127/58  Pulse: 86 79  Resp: 16 20  Temp:    SpO2: 98% 95%    Last Pain:  Vitals:   03/27/17 1659  TempSrc: Oral                 Tiajuana Amass

## 2017-03-27 NOTE — ED Notes (Signed)
Bed: PP29 Expected date:  Expected time:  Means of arrival:  Comments: Acute Appy

## 2017-03-27 NOTE — Op Note (Signed)
Re:   [name] DOB:   1945-10-26 MRN:   [id number]                   FACILITY:  [facility]  DATE OF PROCEDURE: 03/27/2017                              OPERATIVE REPORT  PREOPERATIVE DIAGNOSIS:  Appendicitis  POSTOPERATIVE DIAGNOSIS:  Acute purulent appendicitis perforated near the base.  PROCEDURE:  Laparoscopic appendectomy.  SURGEON:  Fenton Malling. Lucia Gaskins, MD  ASSISTANT:  No first assistant.  ANESTHESIA:  General endotracheal.  Anesthesiologist: Suzette Battiest, MD CRNA: Anne Fu, CRNA  ASA:  2E  ESTIMATED BLOOD LOSS:  Minimal.  DRAINS: none   SPECIMEN:   Appendix  COUNTS CORRECT:  YES  INDICATIONS FOR PROCEDURE: [name] is a 72 y.o. (DOB: Jan 18, 1946) white female whose primary care doctor is [name], MD and comes to the OR for an appendectomy.   I discussed with the patient, the indications and potential complications of appendiceal surgery.  The potential complications include, but are not limited to, bleeding, open surgery, bowel resection, and the possibility of another diagnosis.  OPERATIVE NOTE:  The patient underwent a general endotracheal anesthetic as supervised by Anesthesiologist: Suzette Battiest, MD CRNA: Anne Fu, CRNA, General, in [place] room #1 at [facility].  The patient was given Zosyn prior to the beginning of the procedure and the abdomen was prepped with ChloraPrep.   A time-out was held and surgical checklist run.  An infraumbilical incision was made with sharp dissection carried down to the abdominal cavity.  An 12 mm Hasson trocar was inserted through the infraumbilical incision and into the peritoneal cavity.  A 30 degree 5 mm laparoscope was inserted through a 12 mm Hasson trocar and the Hasson trocar secured with a 0 Vicryl suture.  I placed a 5 mm trocar in the right upper quadrant and a 5 mm torcar in left lower quadrant and did abdominal exploration.    The right and left lobes of liver unremarkable.  Stomach was  unremarkable.  The pelvic organs were unremarkable.  Her bladder was full.  I saw no other intra-abdominal abnormality.  The patient had appendicitis with the appendix located right pelvic brim.  It was wrapped in the fat of the antimesenteric terminal ileum and stuck to the anterior peritoneal wall.  The appendix was purulent, had ruptured near the base, but did not have much contamination outside of the what was walled off..  The appendix was focally ruptured.  The mesentery of the appendix was divided with a Harmonic scalpel.  I got to the base of the appendix.  I then used a blue load 45 mm Ethicon Endo-GIA stapler and fired this across the base of the appendix.  I placed the appendix in EndoCatch bag and delivered the bag through the umbilical incision.  I irrigated the abdomen with 1,000 cc of saline.  After irrigating the abdomen, I then removed the trocars, in turn.  The umbilical port fascia was closed with 0 Vicryl suture.  I placed two additional 0 Vicryl sutures in the infraumbilical wound.   I closed the skin each site with a 4-0 Monocryl suture and painted the wounds with DermaBond.  I then injected a total of 20 mL of 0.25% Marcaine at the incisions.  Sponge and needle count were correct at the end of the case.  The patient was transferred to the recovery room in good condition.  The patient tolerated the procedure well and it depends on the patient's post op clinical course as to when the patient could be discharged.   Alphonsa Overall, MD, [facility] Pager: [phone] Office phone:  [phone]

## 2017-03-27 NOTE — H&P (Signed)
Re:   [name] DOB:   May 19, 1945 MRN:   [id number]  Chief Complaint Abdominal pain  ASSESEMENT AND PLAN: 1.  Appendicitis  I discussed with the patient the indications and risks of appendiceal surgery.  The primary risks of appendiceal surgery include, but are not limited to, bleeding, infection, bowel surgery, and open surgery.  There is also the risk that the patient may have continued symptoms after surgery.  We discussed the typical post-operative recovery course. I tried to answer the patient's questions.  2.  GERD 3.  HTN  Chief Complaint  Patient presents with  . Abdominal Pain   PHYSICIAN REQUESTING CONSULTATION:  Dr. Ellender Hose, [facility]  HISTORY OF PRESENT ILLNESS: [name] is a 72 y.o. (DOB: January 13, 1946)  white female whose primary care physician is Christina Pillar, MD and comes to the [facility] with abdominal pain.   She has no history of GI problems.  She has no stomach, liver, pancreas, or colon disease.  She had a negative colonoscopy about 5 years ago.  She's had no prior abdominal surgery.  She developed abdominal pain on Tuesday, 03/25/2016.  She just had her annual physical with Dr. Laurann [name] on Monday.  The pain got worse last PM.  She said that she passed out at home and fell and hit her head.  She came to the [facility] ER today with pain which has localized to the RLQ.  WBC - 14,200 - 03/27/2017 CT abdomen - 03/27/2017 - Acute appendicitis. Appendix is significantly distended, measuring up to 14 mm diameter at its base. Appendiceal walls are thickened/indistinct. There is a large amount of associated periappendiceal fluid stranding. No abscess collection identified.  No free intraperitoneal air identified.   Past Medical History:  Diagnosis Date  . Arthritis   . GERD (gastroesophageal reflux disease)    mild- TUMS used sparingly  . Hyperlipidemia   . Hypertension   . PONV (postoperative nausea and vomiting)    also "passed out with cyst removal from neck"superficial"  .  Syncope    x2 episodes-evaluated and findings normal- "usually related to seeing blood or thought of it"      Past Surgical History:  Procedure Laterality Date  . BREAST SURGERY  08/27/11   right lumpectomy w/needle localization  . BUNIONECTOMY Bilateral 2000   bilateral -hardware remains  . CYST REMOVAL NECK     sebaceous  cyst from neck  . DILATION AND CURETTAGE OF UTERUS    . INTRAUTERINE DEVICE INSERTION    . IUD REMOVAL    . Removal Right Straphenous vein  2011  . TONSILLECTOMY     child  . TOTAL HIP ARTHROPLASTY Right 11/29/2015   Procedure: RIGHT TOTAL HIP ARTHROPLASTY ANTERIOR APPROACH;  Surgeon: Gaynelle Arabian, MD;  Location: [facility];  Service: Orthopedics;  Laterality: Right;      No current facility-administered medications for this encounter.    Current Outpatient Medications  Medication Sig Dispense Refill  . atorvastatin (LIPITOR) 80 MG tablet Take 80 mg by mouth every evening.    Marland Kitchen FINACEA 15 % cream Apply 1 application topically 2 (two) times daily. Applied to nose.  0  . MICARDIS HCT 40-12.5 MG per tablet Take 1 tablet by mouth daily.     . Multiple Vitamins-Minerals (WOMENS MULTIVITAMIN PO) Take 1 tablet by mouth daily.    . multivitamin-lutein (OCUVITE-LUTEIN) CAPS capsule Take 1 capsule by mouth daily.    . Omega-3 Fatty Acids (FISH OIL) 1200 MG CAPS Take 1,200 mg  by mouth daily.    Marland Kitchen ZETIA 10 MG tablet Take 10 mg by mouth every evening.      Facility-Administered Medications Ordered in Other Encounters  Medication Dose Route Frequency Provider Last Rate Last Dose  . iopamidol (ISOVUE-300) 61 % injection           . iopamidol (ISOVUE-300) 61 % injection              No Known Allergies  REVIEW OF SYSTEMS: Skin:  No history of rash.  No history of abnormal moles. Infection:  No history of hepatitis or HIV.  No history of MRSA. Neurologic:  No history of stroke.  No history of seizure.  No history of headaches. Cardiac:  HTN x 10 years.  No history of  seeing a cardiologist. Varicose vn surgery - 2011 - [facility] Pulmonary:  Does not smoke cigarettes.  No asthma or bronchitis.  No OSA/CPAP.  Endocrine:  No diabetes. No thyroid disease. Gastrointestinal:  See HPI Breast:  Right breast biopsy - 08/27/2011 -  [facility]:  No history of kidney stones.  No history of bladder infections. Musculoskeletal:  Right hip arthroplasty - [facility] - 11/29/2015 Hematologic:  No bleeding disorder.  No history of anemia.  Not anticoagulated. Psycho-social:  The patient is oriented.   The patient has no obvious psychologic or social impairment to understanding our conversation and plan.  SOCIAL and FAMILY HISTORY: Married. Her husband, [name], is with her.  They were supposed to go to [place] this weekend (after the [place] game).  No family history of appendicitis. PHYSICAL EXAM: BP (!) 154/74 (BP Location: Left Arm)   Pulse 100   Temp 98.6 F (37 C) (Oral)   Resp (!) 29   SpO2 95%   General: Mildly obese WF who is alert and generally healthy appearing.  Skin:  Inspection and palpation - no mass or rash. Eyes:  Conjunctiva and lids unremarkable.            Pupils are equal Ears, Nose, Mouth, and Throat:  Ears and nose unremarkable            Lips and teeth are unremarable. Neck: Supple. No mass, trachea midline.  No thyroid mass. Lymph Nodes:  No supraclavicular, cervical, or inguinal nodes. Lungs: Normal respiratory effort.  Clear to auscultation and symmetric breath sounds. Heart:  Palpation of the heart is normal.            Auscultation: RRR. No murmur or rub.  Abdomen: Soft. No mass. Tender in the RLQ.  I don't feel a definite mass. Rectal: Not done. Musculoskeletal:  Good muscle strength and ROM  in upper and lower extremities. Neurologic:  Grossly intact to motor and sensory function. Psychiatric: Normal judgement and insight. Behavior is normal.            Oriented to time, person, place.   DATA REVIEWED, COUNSELING AND  COORDINATION OF CARE: Epic notes reviewed. Counseling and coordination of care exceeded more than 50% of the time spent with patient. Total time spent with patient and charting: 45 minutes.  Alphonsa Overall, MD,  [facility], [address]  [place], [place]    [place] Phone:  [phone] FAX:  [phone]

## 2017-03-27 NOTE — ED Provider Notes (Signed)
[facility] DEPT Provider Note   CSN: [id number] Arrival date & time: 03/27/17  1632     History   Chief Complaint Chief Complaint  Patient presents with  . Abdominal Pain    HPI [name] is a 72 y.o. female.  HPI   72 year old female here with abnormal CT scan from outside CT.  The patient reports she has had abdominal pain since Tuesday.  It started as generalized abdominal pain and has now migrated towards her right lower quadrant.  The pain is aching and throbbing.  It is worse with movement and palpation.  She is had very poor appetite as well as nausea and chills.  No documented fevers.  Denies any alleviating factors.  She went to her PCP today who obtained a CT scan which is concerning for acute appendicitis.  She was subsequent sent here for evaluation.  She denies any urinary symptoms.     Past Medical History:  Diagnosis Date  . Arthritis   . GERD (gastroesophageal reflux disease)    mild- TUMS used sparingly  . Hyperlipidemia   . Hypertension   . PONV (postoperative nausea and vomiting)    also "passed out with cyst removal from neck"superficial"  . Syncope    x2 episodes-evaluated and findings normal- "usually related to seeing blood or thought of it"    Patient Active Problem List   Diagnosis Date Noted  . OA (osteoarthritis) of hip 11/29/2015    Past Surgical History:  Procedure Laterality Date  . BREAST SURGERY  08/27/11   right lumpectomy w/needle localization  . BUNIONECTOMY Bilateral 2000   bilateral -hardware remains  . CYST REMOVAL NECK     sebaceous  cyst from neck  . DILATION AND CURETTAGE OF UTERUS    . INTRAUTERINE DEVICE INSERTION    . IUD REMOVAL    . Removal Right Straphenous vein  2011  . TONSILLECTOMY     child  . TOTAL HIP ARTHROPLASTY Right 11/29/2015   Procedure: RIGHT TOTAL HIP ARTHROPLASTY ANTERIOR APPROACH;  Surgeon: Gaynelle Arabian, MD;  Location: [facility];  Service: Orthopedics;  Laterality:  Right;    OB History    No data available       Home Medications    Prior to Admission medications   Medication Sig Start Date End Date Taking? Authorizing Provider  atorvastatin (LIPITOR) 80 MG tablet Take 80 mg by mouth every evening.    [provider]  FINACEA 15 % cream Apply 1 application topically 2 (two) times daily. Applied to nose. 09/13/15   [provider]  methocarbamol (ROBAXIN) 500 MG tablet Take 1 tablet (500 mg total) by mouth every 6 (six) hours as needed for muscle spasms. 12/01/15   Perkins, Alexzandrew L, PA-C  MICARDIS HCT 40-12.5 MG per tablet Take 1 tablet by mouth daily.  06/26/11   [provider]  ondansetron (ZOFRAN) 4 MG tablet Take 1 tablet (4 mg total) by mouth every 6 (six) hours as needed for nausea. 12/01/15   Perkins, Alexzandrew L, PA-C  oxyCODONE (OXY IR/ROXICODONE) 5 MG immediate release tablet Take 1-2 tablets (5-10 mg total) by mouth every 3 (three) hours as needed for breakthrough pain. 12/01/15   Perkins, Alexzandrew L, PA-C  rivaroxaban (XARELTO) 10 MG TABS tablet Take 1 tablet (10 mg total) by mouth daily with breakfast. Take Xarelto for two and a half more weeks, then discontinue Xarelto. Once the patient has completed the blood thinner regimen, then take a Baby  81 mg Aspirin daily for three more weeks. 12/02/15   Perkins, Alexzandrew L, PA-C  traMADol (ULTRAM) 50 MG tablet Take 1-2 tablets (50-100 mg total) by mouth every 6 (six) hours as needed (mild pain). 12/01/15   Perkins, Alexzandrew L, PA-C  ZETIA 10 MG tablet Take 10 mg by mouth every evening.  09/16/11   [provider]    Family History Family History  Problem Relation Age of Onset  . Cancer Paternal Aunt        Breast    Social History Social History   Tobacco Use  . Smoking status: Former Smoker    Packs/day: 0.50    Years: 10.00    Pack years: 5.00    Last attempt to quit: 11/21/1985    Years since quitting: 31.3  . Smokeless tobacco: Never  Used  Substance Use Topics  . Alcohol use: Yes    Comment: Socially  . Drug use: No     Allergies   Patient has no known allergies.   Review of Systems Review of Systems  Constitutional: Positive for appetite change and fatigue.  Gastrointestinal: Positive for abdominal pain and nausea.  All other systems reviewed and are negative.    Physical Exam Updated Vital Signs BP (!) 154/74 (BP Location: Left Arm)   Pulse 100   Temp 98.6 F (37 C) (Oral)   Resp (!) 29   SpO2 95%   Physical Exam  Constitutional: She is oriented to person, place, and time. She appears well-developed and well-nourished. No distress.  Well-appearing, no distress  HENT:  Head: Normocephalic and atraumatic.  Eyes: Conjunctivae are normal.  Neck: Neck supple.  Cardiovascular: Normal rate, regular rhythm and normal heart sounds. Exam reveals no friction rub.  No murmur heard. Pulmonary/Chest: Effort normal and breath sounds normal. No respiratory distress. She has no wheezes. She has no rales.  Abdominal: Soft. She exhibits no distension. There is tenderness. There is guarding. There is no rebound.  Musculoskeletal: She exhibits no edema.  Neurological: She is alert and oriented to person, place, and time. She exhibits normal muscle tone.  Skin: Skin is warm. Capillary refill takes less than 2 seconds.  Psychiatric: She has a normal mood and affect.  Nursing note and vitals reviewed.    ED Treatments / Results  Labs (all labs ordered are listed, but only abnormal results are displayed) Labs Reviewed  CBC WITH DIFFERENTIAL/PLATELET  COMPREHENSIVE METABOLIC PANEL  I-STAT CG4 LACTIC ACID, ED    EKG  EKG Interpretation None       Radiology Ct Abdomen Pelvis W Contrast  Result Date: 03/27/2017 CLINICAL DATA:  Right lower quadrant pain since yesterday with syncope. EXAM: CT ABDOMEN AND PELVIS WITH CONTRAST TECHNIQUE: Multidetector CT imaging of the abdomen and pelvis was performed using  the standard protocol following bolus administration of intravenous contrast. CONTRAST:  136mL ISOVUE-300 IOPAMIDOL (ISOVUE-300) INJECTION 61%, 8mL ISOVUE-300 IOPAMIDOL (ISOVUE-300) INJECTION 61% COMPARISON:  None. FINDINGS: Lower chest: No acute abnormality. Hepatobiliary: No focal liver abnormality is seen. No gallstones, gallbladder wall thickening, or biliary dilatation. Pancreas: Partially infiltrated with fat but otherwise unremarkable. Spleen: Normal in size without focal abnormality. Adrenals/Urinary Tract: Adrenal glands appear normal. Punctate nonobstructing left renal stone. 11 mm hypodense mass within the anterior-lateral cortex of the left kidney, too small to definitively characterize but most suggestive of a benign cyst. No hydronephrosis or perinephric fluid. No ureteral or bladder calculi identified. Bladder appears normal. Stomach/Bowel: Appendix: Location: Right lower quadrant Diameter: 14 mm Appendicolith:  Not seen Mucosal hyper-enhancement: Probable Extraluminal gas: None seen Periappendiceal collection: None seen Appendix is significantly distended, up to 14 mm at its base. Walls appear thickened/indistinct and there is a large amount of periappendiceal fluid stranding. No dilated large or small bowel loops. Associated reactive thickening at the base of the cecum. Vascular/Lymphatic: Mild reactive lymphadenopathy in the right lower quadrant. Aortic atherosclerosis. Reproductive: Uterus and bilateral adnexa are unremarkable. Other: No abscess collection or free intraperitoneal air identified. Musculoskeletal: No acute or suspicious osseous finding. Mild degenerative change throughout the slightly scoliotic thoracolumbar spine. IMPRESSION: 1. Acute appendicitis. Appendix is significantly distended, measuring up to 14 mm diameter at its base. Appendiceal walls are thickened/indistinct. There is a large amount of associated periappendiceal fluid stranding. No abscess collection identified. No free  intraperitoneal air identified. 2. No other acute/significant findings within the abdomen or pelvis. Chronic/incidental findings detailed above. These results were called by telephone at the time of interpretation on 03/27/2017 at 4:19 pm to Dr. Gaynelle Arabian , who verbally acknowledged these results. Electronically Signed   By: Franki Cabot M.D.   On: 03/27/2017 16:19    Procedures Procedures (including critical care time)  Medications Ordered in ED Medications  sodium chloride 0.9 % bolus 1,000 mL (0 mLs Intravenous Stopped 03/27/17 1746)  0.9 %  sodium chloride infusion ( Intravenous New Bag/Given 03/27/17 1705)  piperacillin-tazobactam (ZOSYN) IVPB 3.375 g (0 g Intravenous Stopped 03/27/17 1746)     Initial Impression / Assessment and Plan / ED Course  I have reviewed the triage vital signs and the nursing notes.  Pertinent labs & imaging results that were available during my care of the patient were reviewed by me and considered in my medical decision making (see chart for details).     72 yo F with PMHx as above here with RLQ pain, found to have acute appendicitis on outside CT scan. No perforation but marked inflammation and phlegmon. Zosyn given, d/w Dr. Lucia Gaskins who will see pt. NPO. IVF given.  Final Clinical Impressions(s) / ED Diagnoses   Final diagnoses:  Acute appendicitis without peritonitis    ED Discharge Orders    None       Duffy Bruce, MD 03/27/17 1756

## 2017-03-28 ENCOUNTER — Encounter (HOSPITAL_COMMUNITY): Payer: Self-pay | Admitting: Surgery

## 2017-03-28 ENCOUNTER — Other Ambulatory Visit: Payer: Self-pay

## 2017-03-28 MED ORDER — MORPHINE SULFATE (PF) 4 MG/ML IV SOLN: 1.0000 mg | INTRAVENOUS | Status: DC | PRN

## 2017-03-28 MED ORDER — HYDROCODONE-ACETAMINOPHEN 5-325 MG PO TABS: 1.0000 | ORAL_TABLET | ORAL | Status: DC | PRN

## 2017-03-28 MED ORDER — TRAMADOL HCL 50 MG PO TABS: 50.0000 mg | ORAL_TABLET | Freq: Four times a day (QID) | ORAL | Status: DC | PRN

## 2017-03-28 MED ORDER — TELMISARTAN-HCTZ 40-12.5 MG PO TABS: 1.0000 | ORAL_TABLET | Freq: Every day | ORAL | Status: DC

## 2017-03-28 MED ORDER — GABAPENTIN 300 MG PO CAPS
300.0000 mg | ORAL_CAPSULE | Freq: Two times a day (BID) | ORAL | Status: DC
  Administered 2017-03-28 – 2017-03-29 (×3): 300 mg via ORAL
  Filled 2017-03-28 (×3): qty 1

## 2017-03-28 MED ORDER — HYDROCHLOROTHIAZIDE 12.5 MG PO CAPS
12.5000 mg | ORAL_CAPSULE | Freq: Every day | ORAL | Status: DC
  Administered 2017-03-28 – 2017-03-29 (×2): 12.5 mg via ORAL
  Filled 2017-03-28 (×2): qty 1

## 2017-03-28 MED ORDER — HEPARIN SODIUM (PORCINE) 5000 UNIT/ML IJ SOLN
5000.0000 [IU] | Freq: Three times a day (TID) | INTRAMUSCULAR | Status: DC
  Administered 2017-03-28 – 2017-03-29 (×3): 5000 [IU] via SUBCUTANEOUS
  Filled 2017-03-28 (×3): qty 1

## 2017-03-28 MED ORDER — IRBESARTAN 150 MG PO TABS
150.0000 mg | ORAL_TABLET | Freq: Every day | ORAL | Status: DC
  Administered 2017-03-28 – 2017-03-29 (×2): 150 mg via ORAL
  Filled 2017-03-28 (×2): qty 1

## 2017-03-28 MED ORDER — KCL IN DEXTROSE-NACL 20-5-0.45 MEQ/L-%-% IV SOLN
INTRAVENOUS | Status: DC
  Administered 2017-03-28: 02:00:00 via INTRAVENOUS
  Filled 2017-03-28 (×2): qty 1000

## 2017-03-28 MED ORDER — MENTHOL 3 MG MT LOZG: 1.0000 | LOZENGE | OROMUCOSAL | Status: DC | PRN

## 2017-03-28 MED ORDER — PIPERACILLIN-TAZOBACTAM 3.375 G IVPB 30 MIN: 3.3750 g | Freq: Three times a day (TID) | INTRAVENOUS | Status: DC

## 2017-03-28 MED ORDER — PIPERACILLIN-TAZOBACTAM 3.375 G IVPB
3.3750 g | Freq: Three times a day (TID) | INTRAVENOUS | Status: DC
  Administered 2017-03-28 – 2017-03-29 (×4): 3.375 g via INTRAVENOUS
  Filled 2017-03-28 (×5): qty 50

## 2017-03-28 MED ORDER — ONDANSETRON HCL 4 MG/2ML IJ SOLN: 4.0000 mg | Freq: Four times a day (QID) | INTRAMUSCULAR | Status: DC | PRN

## 2017-03-28 MED ORDER — ONDANSETRON 4 MG PO TBDP: 4.0000 mg | ORAL_TABLET | Freq: Four times a day (QID) | ORAL | Status: DC | PRN

## 2017-03-28 NOTE — Progress Notes (Signed)
1 Day Post-Op    CC: Abdominal pain  Subjective: Patient feels much better this a.m.  She had a syncopal episode after returning to the floor from surgery last evening.  She knows she does this frequently.  She has had clear liquids this a.m. and done well with that.  Overall she feels much better just sore.  Objective: Vital signs in last 24 hours: Temp:  [97.8 F (36.6 C)-98.8 F (37.1 C)] 98.4 F (36.9 C) (01/11 0449) Pulse Rate:  [66-100] 66 (01/11 0449) Resp:  [15-29] 17 (01/11 0449) BP: (98-154)/(52-85) 110/61 (01/11 0449) SpO2:  [93 %-100 %] 96 % (01/11 0449) Weight:  [82.1 kg (181 lb)] 82.1 kg (181 lb) (01/11 0015) Last BM Date: 03/26/17(Per pt report) 40 PO 1350 IV Voided x 1 recorded Afebrile, VSS Na 132/WBC 14.2 on admit CT 1/10: Acute appendicitis appendix distended up to 14 mm at the base.  Appendiceal walls are thickened and indistinct is large amount of associated periappendiceal fluid stranding no abscess collection identified.  No free intraperitoneal air.  No other acute significant findings in the abdomen chronic degenerative changes of the thoracic lumbar spine.   Intake/Output from previous day: 01/10 0701 - 01/11 0700 In: 1390 [P.O.:40; I.V.:1300; IV Piggyback:50] Out: 20 [Blood:20] Intake/Output this shift: No intake/output data recorded.  General appearance: alert, cooperative and no distress Resp: clear to auscultation bilaterally GI: Soft, sore, port sites all look good.  Lab Results:  Recent Labs    03/27/17 1655  WBC 14.2*  HGB 15.2*  HCT 44.5  PLT 212    BMET Recent Labs    03/27/17 1655  NA 132*  K 3.5  CL 99*  CO2 23  GLUCOSE 119*  BUN 11  CREATININE 0.91  CALCIUM 9.1   PT/INR No results for input(s): LABPROT, INR in the last 72 hours.  Recent Labs  Lab 03/27/17 1655  AST 29  ALT 20  ALKPHOS 78  BILITOT 1.7*  PROT 8.4*  ALBUMIN 4.2     Lipase  No results found for: LIPASE   Prior to Admission medications    Medication Sig Start Date End Date Taking? Authorizing Provider  atorvastatin (LIPITOR) 80 MG tablet Take 80 mg by mouth every evening.   Yes [provider]  FINACEA 15 % cream Apply 1 application topically 2 (two) times daily. Applied to nose. 09/13/15  Yes [provider]  MICARDIS HCT 40-12.5 MG per tablet Take 1 tablet by mouth daily.  06/26/11  Yes [provider]  Multiple Vitamins-Minerals (WOMENS MULTIVITAMIN PO) Take 1 tablet by mouth daily.   Yes [provider]  multivitamin-lutein (OCUVITE-LUTEIN) CAPS capsule Take 1 capsule by mouth daily.   Yes [provider]  Omega-3 Fatty Acids (FISH OIL) 1200 MG CAPS Take 1,200 mg by mouth daily.   Yes [provider]  ZETIA 10 MG tablet Take 10 mg by mouth every evening.  09/16/11  Yes [provider]    Medications: . gabapentin  300 mg Oral BID  . heparin injection (subcutaneous)  5,000 Units Subcutaneous Q8H  . irbesartan  150 mg Oral Daily   And  . hydrochlorothiazide  12.5 mg Oral Daily  .  morphine injection  4 mg Intravenous Once  . ondansetron (ZOFRAN) IV  4 mg Intravenous Once   . dextrose 5 % and 0.45 % NaCl with KCl 20 mEq/L 100 mL/hr at 03/28/17 0131  . piperacillin-tazobactam (ZOSYN)  IV Stopped (03/28/17 0531)   Anti-infectives (From admission,  onward)   Start     Dose/Rate Route Frequency Ordered Stop   03/28/17 0200  piperacillin-tazobactam (ZOSYN) IVPB 3.375 g     3.375 g 12.5 mL/hr over 240 Minutes Intravenous Every 8 hours 03/28/17 0043     03/28/17 0022  piperacillin-tazobactam (ZOSYN) IVPB 3.375 g  Status:  Discontinued     3.375 g 100 mL/hr over 30 Minutes Intravenous Every 8 hours 03/28/17 0022 03/28/17 0042   03/27/17 1645  piperacillin-tazobactam (ZOSYN) IVPB 3.375 g     3.375 g 100 mL/hr over 30 Minutes Intravenous  Once 03/27/17 1638 03/27/17 1746     Assessment/Plan Perforated appendicitis. Laparoscopic appendectomy 03/27/17, Dr. Alphonsa Overall  GERD Hypertension Dyslipidemia History of syncope - rather frequently  FEN:  IV fluids/clears ID:  Zosyn 1/10 =>  Day 2  DN wants 7 days post op abx DVT:  Heparin Foley: none Follow up:  [facility]   Plan: I am going to advance her diet slowly today.  Continue IV antibiotics and IV fluids.  Dr. Lucia Gaskins would like her to have 7 days of postop antibiotics.  She was perforated but did not have much contamination.       LOS: 1 day    Tala Eber 03/28/2017 [phone]

## 2017-03-28 NOTE — Progress Notes (Signed)
Spoke with Dr. Lucia Gaskins to report pt episode of syncope shortly after arriving to room from PACU. No new orders received at this time.

## 2017-03-29 LAB — BASIC METABOLIC PANEL
ANION GAP: 6 (ref 5–15)
BUN: 8 mg/dL (ref 6–20)
CALCIUM: 8.6 mg/dL — AB (ref 8.9–10.3)
CHLORIDE: 105 mmol/L (ref 101–111)
CO2: 27 mmol/L (ref 22–32)
CREATININE: 0.78 mg/dL (ref 0.44–1.00)
GFR calc non Af Amer: >60 mL/min (ref >60)
Glucose, Bld: 122 mg/dL — ABNORMAL HIGH (ref 65–99)
Potassium: 3.5 mmol/L (ref 3.5–5.1)
Sodium: 138 mmol/L (ref 135–145)

## 2017-03-29 LAB — CBC
HEMATOCRIT: 36.1 % (ref 36.0–46.0)
Hemoglobin: 11.9 g/dL — ABNORMAL LOW (ref 12.0–15.0)
MCH: 31.8 pg (ref 26.0–34.0)
MCHC: 33 g/dL (ref 30.0–36.0)
MCV: 96.5 fL (ref 78.0–100.0)
Platelets: 213 10*3/uL (ref 150–400)
RBC: 3.74 MIL/uL — ABNORMAL LOW (ref 3.87–5.11)
RDW: 13.3 % (ref 11.5–15.5)
WBC: 13.5 10*3/uL — ABNORMAL HIGH (ref 4.0–10.5)

## 2017-03-29 MED ORDER — AMOXICILLIN-POT CLAVULANATE 875-125 MG PO TABS: 1.0000 | ORAL_TABLET | Freq: Two times a day (BID) | ORAL | 0 refills | Status: AC

## 2017-03-29 MED ORDER — TRAMADOL HCL 50 MG PO TABS: 50.0000 mg | ORAL_TABLET | Freq: Four times a day (QID) | ORAL | 0 refills | Status: DC | PRN

## 2017-03-29 NOTE — Progress Notes (Signed)
Pt provided dc instructions; explained to pt in detail. Escorted to lobby via wheelchair by nursing tech

## 2017-03-29 NOTE — Discharge Summary (Signed)
Physician Discharge Summary [facility]  Patient ID: GLORINE HANRATTY MRN: [id number] DOB/AGE: 73-06-1945 73 y.o.  Admit date: 03/27/2017 Discharge date: 03/29/2017  Admission Diagnoses:  Acute appendicitis   Discharge Diagnoses:  Active Problems:   Appendicitis with perforation   Discharged Condition: good  Hospital Course: Patient was admitted for observation following laparoscopic appendectomy surgery.  Post op course was uncomplicated.  Pain was well controlled.  Tolerated diet.  IV Zosyn given throughout hospital stay.  Patient was prepared for discharge home on POD#2.  Consults: None  Treatments: surgery: laparoscopic appendectomy  Discharge Exam: Blood pressure 120/79, pulse 67, temperature 97.8 F (36.6 C), temperature source Oral, resp. rate 16, height 5\' 4"  (1.626 m), weight 82.1 kg (181 lb), SpO2 95 %. HEENT - clear Neck - soft Chest - clear bilaterally Cor - RRR Abd - soft without distension; wounds dry and intact  Patient up in room, putting on make-up.  No complaints.  Tolerating regular diet.  Ambulatory.  Disposition: Home  Discharge Instructions    Diet - low sodium heart healthy   Complete by:  As directed    Discharge instructions   Complete by:  As directed    [facility]  LAPAROSCOPIC SURGERY:  POST-OP INSTRUCTIONS  Always review your discharge instruction sheet given to you by the facility where your surgery was performed.  A prescription for pain medication may be given to you upon discharge.  Take your pain medication as prescribed.  If narcotic pain medicine is not needed, then you may take acetaminophen (Tylenol) or ibuprofen (Advil) as needed.  Take your usually prescribed medications unless otherwise directed.  If you need a refill on your pain medication, please contact your pharmacy.  They will contact our office to request authorization. Prescriptions will not be filled after 5 P.M. or on  weekends.  You should follow a light diet the first few days after arrival home, such as soup and crackers or toast.  Be sure to include plenty of fluids daily.  Most patients will experience some swelling and bruising in the area of the incisions.  Ice packs will help.  Swelling and bruising can take several days to resolve.   It is common to experience some constipation after surgery.  Increasing fluid intake and taking a stool softener (such as Colace) will usually help or prevent this problem from occurring.  A mild laxative (Milk of Magnesia or Miralax) should be taken according to package instructions if there has been no bowel movement after 48 hours.  You will have steri-strips and a gauze dressing over your incisions.  You may remove the gauze bandage on the second day after surgery, and you may shower at that time.  Leave your steri-strips (small skin tapes) in place directly over the incision.  These strips should remain on the skin for 5-7 days and then be removed.  You may get them wet in the shower and pat them dry.  Any sutures or staples will be removed at the office during your follow-up visit.  ACTIVITIES:  You may resume regular (light) daily activities beginning the next day - such as daily self-care, walking, climbing stairs - gradually increasing activities as tolerated.  You may have sexual intercourse when it is comfortable.  Refrain from any heavy lifting or straining until approved by your doctor.  You may drive when you are no longer taking prescription pain medication, you can comfortably wear a seatbelt, and you can safely maneuver  your car and apply brakes.  You should see your doctor in the office for a follow-up appointment approximately 2-3 weeks after your surgery.  Make sure that you call for this appointment within a day or two after you arrive home to insure a convenient appointment time.  WHEN TO CALL YOUR DOCTOR: Fever over 101.0 Inability to  urinate Continued bleeding from incision Increased pain, redness, or drainage from the incision Increasing abdominal pain  The clinic staff is available to answer your questions during regular business hours.  Please don't hesitate to call and ask to speak to one of the nurses for clinical concerns.  If you have a medical emergency, go to the nearest emergency room or call 911.  A surgeon from [facility] is always on call for the hospital.  Earnstine Regal, MD, [facility] Office: [place] Free:  [place] [phone]  Website: [email or web address]   Increase activity slowly   Complete by:  As directed    No dressing needed   Complete by:  As directed      Allergies as of 03/29/2017   No Known Allergies     Medication List    TAKE these medications   amoxicillin-clavulanate 875-125 MG tablet Commonly known as:  AUGMENTIN Take 1 tablet by mouth every 12 (twelve) hours for 7 days.   atorvastatin 80 MG tablet Commonly known as:  LIPITOR Take 80 mg by mouth every evening.   FINACEA 15 % cream Generic drug:  Azelaic Acid Apply 1 application topically 2 (two) times daily. Applied to nose.   Fish Oil 1200 MG Caps Take 1,200 mg by mouth daily.   MICARDIS HCT 40-12.5 MG tablet Generic drug:  telmisartan-hydrochlorothiazide Take 1 tablet by mouth daily.   multivitamin-lutein Caps capsule Take 1 capsule by mouth daily.   WOMENS MULTIVITAMIN PO Take 1 tablet by mouth daily.   traMADol 50 MG tablet Commonly known as:  ULTRAM Take 1-2 tablets (50-100 mg total) by mouth every 6 (six) hours as needed for moderate pain (mild pain).   ZETIA 10 MG tablet Generic drug:  ezetimibe Take 10 mg by mouth every evening.      Follow-up [facility], [place]. Schedule an appointment as soon as possible for a visit in 2 week(s).   Specialty:  General Surgery Contact information: [address] [place], MD, [facility] Office: [phone]   Signed: Earnstine Regal 03/29/2017, 9:02 AM

## 2017-06-16 DIAGNOSIS — C801 Malignant (primary) neoplasm, unspecified: Secondary | ICD-10-CM

## 2017-06-16 HISTORY — DX: Malignant (primary) neoplasm, unspecified: C80.1

## 2017-07-16 ENCOUNTER — Other Ambulatory Visit: Payer: Self-pay | Admitting: Radiology

## 2017-07-18 ENCOUNTER — Telehealth: Payer: Self-pay | Admitting: Hematology

## 2017-07-18 NOTE — Telephone Encounter (Signed)
Spoke to patient to confirm afternoon [facility] appointment on 5/8, [facility] patient, reminder mailed to patient

## 2017-07-21 ENCOUNTER — Encounter: Payer: Self-pay | Admitting: *Deleted

## 2017-07-21 NOTE — Progress Notes (Signed)
[place]  [phone] Fax:([phone]) [place] Note   Patient Care Team: [name], MD as PCP - General [name], MD as Consulting Physician (General Surgery) Truitt Merle, MD as Consulting Physician (Hematology) Kyung Rudd, MD as Consulting Physician (Radiation Oncology) 07/23/2017  CHIEF COMPLAINTS/PURPOSE OF CONSULTATION:  Malignancy neoplasm of lower-inner quadrant of right breast   Oncology History   Cancer Staging Malignant neoplasm of lower-inner quadrant of right breast of female, estrogen receptor positive ([place]) Staging form: Breast, AJCC 8th Edition - Clinical stage from 07/16/2017: Stage IA (cT1b, cN0, cM0, G2, ER+, PR+, HER2-) - Signed by Truitt Merle, MD on 07/23/2017       Malignant neoplasm of lower-inner quadrant of right breast of female, estrogen receptor positive ([place])   07/15/2017 Mammogram    IMPRESSION: The new irregular lesion in the right breast is indeterminate. An ultrasound is recommended.       07/15/2017 Imaging    [place] R Breast and Axilla 07/15/17 IMPRESSION:  The 6 mm mass in the right breast is suspicious for malignancy. A biopsy is recommended.       07/16/2017 Cancer Staging    Staging form: Breast, AJCC 8th Edition - Clinical stage from 07/16/2017: Stage IA (cT1b, cN0, cM0, G2, ER+, PR+, HER2-) - Signed by Truitt Merle, MD on 07/23/2017      07/16/2017 Initial Biopsy    Diagnosis 07/16/17 Breast, right, needle core biopsy, 3 o'clock, 7cm - INVASIVE DUCTAL CARCINOMA, SEE COMMENT. - DUCTAL CARCINOMA IN SITU WITH NECROSIS.      07/16/2017 Receptors her2    Estrogen Receptor: 100%, POSITIVE, STRONG STAINING INTENSITY Progesterone Receptor: 80%, POSITIVE, STRONG STAINING INTENSITY Proliferation Marker Ki67: 10% HER2 - NEGATIVE      07/22/2017 Initial Diagnosis    Malignant neoplasm of lower-inner quadrant of right breast of female, estrogen receptor positive ([facility])       HISTORY OF PRESENTING ILLNESS:    [name] 72 y.o. female is a here because of newly diagnosed breast cancer. The patient was referred by her PCP. The patient presents to the clinic today accompanied by her husband.  Prior to pt's abnormal mammogram, she states did not feel any lump. She states she was compliant with yearly screening because she had an abnormal mammogram with a benign papilloma that was removed. She states she feels well overall, not noticing any weight loss, back pain or extensive joint pain.   Pt's diagnsotic mammogram from 07/15/17 warranted further evaluation. Her ultrasoun from the same day revealed a 6 mm mass in the right breast suspicious for malignancy. Her initial biopsy confirmed right breast invasive carcinoma.   Estrogen Receptor: 100%, POSITIVE, STRONG STAINING INTENSITY Progesterone Receptor: 80%, POSITIVE, STRONG STAINING INTENSITY Proliferation Marker Ki67: 10% HER2 - NEGATIVE  She has a medical history of HTN and GERD. She has a surgical history of appendectomy and lumpectomy.   GYN HISTORY  Menarchal: 12 LMP: 1221, at 72 years old  HRT: None G3P2: first birth at age 89   She had a FMHx of CA by her paternal aunt who had cancer and died at a young age. Her mother had a benign brain tumor.  She denies tobacco use recently and she drinks alcohol socially. Socially, she she is married, retired and lives at home.   MEDICAL HISTORY:  Past Medical History:  Diagnosis Date  . Arthritis   . GERD (gastroesophageal reflux disease)    mild- TUMS used sparingly  . Hyperlipidemia   .  Hypertension   . PONV (postoperative nausea and vomiting)    also "passed out with cyst removal from neck"superficial"  . Syncope    x2 episodes-evaluated and findings normal- "usually related to seeing blood or thought of it"    SURGICAL HISTORY: Past Surgical History:  Procedure Laterality Date  . ABDOMINAL HYSTERECTOMY    . BREAST SURGERY  08/27/11   right lumpectomy w/needle localization  .  BUNIONECTOMY Bilateral 2000   bilateral -hardware remains  . CYST REMOVAL NECK     sebaceous  cyst from neck  . DILATION AND CURETTAGE OF UTERUS    . INTRAUTERINE DEVICE INSERTION    . IUD REMOVAL    . LAPAROSCOPIC APPENDECTOMY N/A 03/27/2017   Procedure: APPENDECTOMY LAPAROSCOPIC;  Surgeon: Alphonsa Overall, MD;  Location: [facility];  Service: General;  Laterality: N/A;  . Removal Right Straphenous vein  2011  . TONSILLECTOMY     child  . TOTAL HIP ARTHROPLASTY Right 11/29/2015   Procedure: RIGHT TOTAL HIP ARTHROPLASTY ANTERIOR APPROACH;  Surgeon: Gaynelle Arabian, MD;  Location: [facility];  Service: Orthopedics;  Laterality: Right;    SOCIAL HISTORY: Social History   Socioeconomic History  . Marital status: Married    Spouse name: Not on file  . Number of children: Not on file  . Years of education: Not on file  . Highest education level: Not on file  Occupational History  . Not on file  Social Needs  . Financial resource strain: Not on file  . Food insecurity:    Worry: Not on file    Inability: Not on file  . Transportation needs:    Medical: Not on file    Non-medical: Not on file  Tobacco Use  . Smoking status: Former Smoker    Packs/day: 0.50    Years: 10.00    Pack years: 5.00    Last attempt to quit: 11/21/1985    Years since quitting: 31.6  . Smokeless tobacco: Never Used  Substance and Sexual Activity  . Alcohol use: Yes    Comment: Socially  . Drug use: No  . Sexual activity: Yes  Lifestyle  . Physical activity:    Days per week: Not on file    Minutes per session: Not on file  . Stress: Not on file  Relationships  . Social connections:    Talks on phone: Not on file    Gets together: Not on file    Attends religious service: Not on file    Active member of club or organization: Not on file    Attends meetings of clubs or organizations: Not on file    Relationship status: Not on file  . Intimate partner violence:    Fear of current or ex partner: Not on file      Emotionally abused: Not on file    Physically abused: Not on file    Forced sexual activity: Not on file  Other Topics Concern  . Not on file  Social History Narrative  . Not on file    FAMILY HISTORY: Family History  Problem Relation Age of Onset  . Cancer Mother        benign brain tumor   . Cancer Paternal Aunt        unknown type     ALLERGIES:  has No Known Allergies.  MEDICATIONS:  Current Outpatient Medications  Medication Sig Dispense Refill  . atorvastatin (LIPITOR) 80 MG tablet Take 80 mg by mouth every evening.    Marland Kitchen  FINACEA 15 % cream Apply 1 application topically 2 (two) times daily. Applied to nose.  0  . MICARDIS HCT 40-12.5 MG per tablet Take 1 tablet by mouth daily.     . Multiple Vitamins-Minerals (WOMENS MULTIVITAMIN PO) Take 1 tablet by mouth daily.    . multivitamin-lutein (OCUVITE-LUTEIN) CAPS capsule Take 1 capsule by mouth daily.    . Omega-3 Fatty Acids (FISH OIL) 1200 MG CAPS Take 1,200 mg by mouth daily.    Marland Kitchen ZETIA 10 MG tablet Take 10 mg by mouth every evening.     . traMADol (ULTRAM) 50 MG tablet Take 1-2 tablets (50-100 mg total) by mouth every 6 (six) hours as needed for moderate pain (mild pain). (Patient not taking: Reported on 07/23/2017) 15 tablet 0   No current facility-administered medications for this visit.     REVIEW OF SYSTEMS:   Constitutional: Denies fevers, chills or abnormal night sweats Eyes: Denies blurriness of vision, double vision or watery eyes Ears, nose, mouth, throat, and face: Denies mucositis or sore throat Respiratory: Denies cough, dyspnea or wheezes Cardiovascular: Denies palpitation, chest discomfort or lower extremity swelling Gastrointestinal:  Denies nausea, heartburn or change in bowel habits Skin: Denies abnormal skin rashes Lymphatics: Denies new lymphadenopathy or easy bruising Neurological:Denies numbness, tingling or new weaknesses Behavioral/Psych: Mood is stable, no new changes  All other systems  were reviewed with the patient and are negative.  PHYSICAL EXAMINATION: ECOG PERFORMANCE STATUS: 0 - Asymptomatic  Vitals:   07/23/17 1219  BP: (!) 141/89  Pulse: 95  Resp: 18  Temp: 98.1 F (36.7 C)  SpO2: 96%   Filed Weights   07/23/17 1219  Weight: 184 lb 6.4 oz (83.6 kg)    GENERAL:alert, no distress and comfortable SKIN: skin color, texture, turgor are normal, no rashes or significant lesions EYES: normal, conjunctiva are pink and non-injected, sclera clear OROPHARYNX:no exudate, no erythema and lips, buccal mucosa, and tongue normal  NECK: supple, thyroid normal size, non-tender, without nodularity LYMPH:  no palpable lymphadenopathy in the cervical, axillary or inguinal LUNGS: clear to auscultation and percussion with normal breathing effort HEART: regular rate & rhythm and no murmurs and no lower extremity edema ABDOMEN:abdomen soft, non-tender and normal bowel sounds Musculoskeletal:no cyanosis of digits and no clubbing  PSYCH: alert & oriented x 3 with fluent speech NEURO: no focal motor/sensory deficits Breasts: Breast inspection showed them to be symmetrical with no nipple discharge. Palpation of the breasts and axilla revealed no obvious mass that I could appreciate. (+) She has bruising in the inner-lower quadrant of the right breast.   LABORATORY DATA:  I have reviewed the data as listed CBC Latest Ref Rng & Units 07/23/2017 03/29/2017 03/27/2017  WBC 3.9 - 10.3 K/uL 6.3 13.5(H) 14.2(H)  Hemoglobin 11.6 - 15.9 g/dL 14.5 11.9(L) 15.2(H)  Hematocrit 34.8 - 46.6 % 42.8 36.1 44.5  Platelets 145 - 400 K/uL 202 213 212   CMP Latest Ref Rng & Units 07/23/2017 03/29/2017 03/27/2017  Glucose 70 - 140 mg/dL 95 122(H) 119(H)  BUN 7 - 26 mg/dL _0 Creatinine 0.60 - 1.10 mg/dL 0.86 0.78 0.91  Sodium 136 - 145 mmol/L 141 138 132(L)  Potassium 3.5 - 5.1 mmol/L 4.1 3.5 3.5  Chloride 98 - 109 mmol/L 105 105 99(L)  CO2 22 - 29 mmol/L _1 Calcium 8.4 - 10.4 mg/dL 10.0  8.6(L) 9.1  Total Protein 6.4 - 8.3 g/dL 7.5 - 8.4(H)  Total Bilirubin 0.2 - 1.2 mg/dL  0.7 - 1.7(H)  Alkaline Phos 40 - 150 U/L 74 - 78  AST 5 - 34 U/L 20 - 29  ALT 0 - 55 U/L 21 - 20   PATHOLOGY  Diagnosis 07/16/17 Breast, right, needle core biopsy, 3 o'clock, 7cm - INVASIVE DUCTAL CARCINOMA, SEE COMMENT. - DUCTAL CARCINOMA IN SITU WITH NECROSIS. Microscopic Comment The carcinoma appears grade 2. Prognostic markers will be ordered. Dr. Lyndon Code has reviewed the case. The case was called to [employer] on 07/17/2017. Results: Estrogen Receptor: 100%, POSITIVE, STRONG STAINING INTENSITY Progesterone Receptor: 80%, POSITIVE, STRONG STAINING INTENSITY Proliferation Marker Ki67: 10% HER2 - NEGATIVE  RADIOGRAPHIC STUDIES: I have personally reviewed the radiological images as listed and agreed with the findings in the report. No results found.   [place] R Breast and Axilla 07/15/17 IMPRESSION:  The 6 mm mass in the right breast is suspicious for malignancy. A biopsy is recommended.   Diagnostic Mammogram 07/15/17 IMPRESSION: The new irregular lesion in the right breast is indeterminate. An ultrasound is recommended.   ASSESSMENT & PLAN:  [name] is 72 year old post-menopausal woman, presented with screening discovered right breast cancer.   1. 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 --She is a candidate for breast conservation surgery. She has been seen by breast surgeon Dr. Barry Dienes, who recommends lumpectomy with sentinel lymph node biopsy. -I recommend a Oncotype Dx test on the surgical sample if it is still Grade II and tumor >=78m, or tumor more than 10 mm with any grade. We'll make a decision about adjuvant chemotherapy based on the Oncotype result. Written material of this  test was given to her. She is young and fit, would be a good candidate for chemotherapy if her Oncotype recurrence score is high. -If her surgical sentinel lymph node positive, I recommend mammaprint for further risk stratification and guide adjuvant chemotherapy. -The risk of recurrence depends on the stage and biology of the tumor. She is stage 1A, with ER/PR positive and HER2 negative. I discussed this is the {ore common type of slow growing tumor.  -She was also seen by radiation oncologist Dr. MLisbeth Renshawtoday. She will need adjuvant Radiation Therapy to reduce her risk of local recurrence.   -Giving the strong ER and PR expression in her postmenopausal status, I recommend adjuvant endocrine therapy for a total of 5-10 years to reduce the risk of cancer recurrence. Potential benefits and side effects were discussed with patient and she is interested. Plan to start once she recovers from radiation.  -Labs reviewed, her CBC and CMP are WNL -We also discussed the breast cancer surveillance after her surgery. She will continue annual screening mammogram, self exam, and a routine office visit with lab and exam with [place] -I encouraged her to have healthy diet and exercise regularly  2. HTN -Continue medication and follow-up with PCP  PLAN:  -Lumpectomy soon with Dr. BBarry Dienes -Oncotype on surgical sample if it is Grade II or tumor greater than 10 mm -I will see her back after oncotype or RT    No orders of the defined types were placed in this encounter.  All questions were answered. The patient knows to call the clinic with any problems, questions or concerns.  I spent 35 minutes counseling the patient face to face. The total time spent in the appointment was  45 minutes and more than 50% was on counseling.   This document serves as a record of services personally performed by Truitt Merle, MD. It was created on her behalf by Theresia Bough, a trained medical scribe. The creation of this record is based  on the scribe's personal observations and the provider's statements to them.   I have reviewed the above documentation for accuracy and completeness, and I agree with the above.    Truitt Merle, MD 07/23/2017

## 2017-07-22 ENCOUNTER — Other Ambulatory Visit: Payer: Self-pay

## 2017-07-22 DIAGNOSIS — Z170 Estrogen receptor positive status [ER+]: Secondary | ICD-10-CM

## 2017-07-22 DIAGNOSIS — D0591 Unspecified type of carcinoma in situ of right breast: Secondary | ICD-10-CM

## 2017-07-22 DIAGNOSIS — C50311 Malignant neoplasm of lower-inner quadrant of right female breast: Secondary | ICD-10-CM | POA: Insufficient documentation

## 2017-07-23 ENCOUNTER — Ambulatory Visit
Admission: RE | Admit: 2017-07-23 | Discharge: 2017-07-23 | Disposition: A | Discharge status: Home / Self Care | Payer: Medicare Other | Source: Ambulatory Visit | Attending: Radiation Oncology | Admitting: Radiation Oncology

## 2017-07-23 ENCOUNTER — Other Ambulatory Visit: Payer: Self-pay

## 2017-07-23 ENCOUNTER — Ambulatory Visit: Payer: Medicare Other | Attending: General Surgery | Admitting: Physical Therapy

## 2017-07-23 ENCOUNTER — Encounter: Payer: Self-pay | Admitting: Physical Therapy

## 2017-07-23 ENCOUNTER — Encounter: Payer: Self-pay | Admitting: Hematology

## 2017-07-23 ENCOUNTER — Inpatient Hospital Stay: Payer: Medicare Other

## 2017-07-23 ENCOUNTER — Inpatient Hospital Stay: Payer: Medicare Other | Attending: Hematology | Admitting: Hematology

## 2017-07-23 ENCOUNTER — Other Ambulatory Visit: Payer: Self-pay | Admitting: General Surgery

## 2017-07-23 DIAGNOSIS — C50211 Malignant neoplasm of upper-inner quadrant of right female breast: Secondary | ICD-10-CM | POA: Diagnosis not present

## 2017-07-23 DIAGNOSIS — D0591 Unspecified type of carcinoma in situ of right breast: Secondary | ICD-10-CM

## 2017-07-23 DIAGNOSIS — Z170 Estrogen receptor positive status [ER+]: Principal | ICD-10-CM

## 2017-07-23 DIAGNOSIS — M1990 Unspecified osteoarthritis, unspecified site: Secondary | ICD-10-CM | POA: Diagnosis not present

## 2017-07-23 DIAGNOSIS — E785 Hyperlipidemia, unspecified: Secondary | ICD-10-CM | POA: Diagnosis not present

## 2017-07-23 DIAGNOSIS — Z79899 Other long term (current) drug therapy: Secondary | ICD-10-CM

## 2017-07-23 DIAGNOSIS — R293 Abnormal posture: Secondary | ICD-10-CM | POA: Diagnosis present

## 2017-07-23 DIAGNOSIS — K219 Gastro-esophageal reflux disease without esophagitis: Secondary | ICD-10-CM | POA: Diagnosis not present

## 2017-07-23 DIAGNOSIS — I10 Essential (primary) hypertension: Secondary | ICD-10-CM

## 2017-07-23 DIAGNOSIS — Z87891 Personal history of nicotine dependence: Secondary | ICD-10-CM

## 2017-07-23 DIAGNOSIS — C50311 Malignant neoplasm of lower-inner quadrant of right female breast: Secondary | ICD-10-CM | POA: Diagnosis not present

## 2017-07-23 DIAGNOSIS — Z809 Family history of malignant neoplasm, unspecified: Secondary | ICD-10-CM | POA: Insufficient documentation

## 2017-07-23 LAB — CBC WITH DIFFERENTIAL (CANCER CENTER ONLY)
Basophils Absolute: 0.1 10*3/uL (ref 0.0–0.1)
Basophils Relative: 1 %
EOS ABS: 0.1 10*3/uL (ref 0.0–0.5)
Eosinophils Relative: 1 %
HEMATOCRIT: 42.8 % (ref 34.8–46.6)
HEMOGLOBIN: 14.5 g/dL (ref 11.6–15.9)
LYMPHS ABS: 1.5 10*3/uL (ref 0.9–3.3)
Lymphocytes Relative: 24 %
MCH: 31.9 pg (ref 25.1–34.0)
MCHC: 33.8 g/dL (ref 31.5–36.0)
MCV: 94.2 fL (ref 79.5–101.0)
Monocytes Absolute: 0.5 10*3/uL (ref 0.1–0.9)
Monocytes Relative: 7 %
NEUTROS ABS: 4.1 10*3/uL (ref 1.5–6.5)
NEUTROS PCT: 67 %
Platelet Count: 202 10*3/uL (ref 145–400)
RBC: 4.54 MIL/uL (ref 3.70–5.45)
RDW: 13.8 % (ref 11.2–14.5)
WBC Count: 6.3 10*3/uL (ref 3.9–10.3)

## 2017-07-23 LAB — CMP (CANCER CENTER ONLY)
ALBUMIN: 4.3 g/dL (ref 3.5–5.0)
ALK PHOS: 74 U/L (ref 40–150)
ALT: 21 U/L (ref 0–55)
ANION GAP: 7 (ref 3–11)
AST: 20 U/L (ref 5–34)
BUN: 13 mg/dL (ref 7–26)
CHLORIDE: 105 mmol/L (ref 98–109)
CO2: 29 mmol/L (ref 22–29)
CREATININE: 0.86 mg/dL (ref 0.60–1.10)
Calcium: 10 mg/dL (ref 8.4–10.4)
Glucose, Bld: 95 mg/dL (ref 70–140)
Potassium: 4.1 mmol/L (ref 3.5–5.1)
SODIUM: 141 mmol/L (ref 136–145)
Total Bilirubin: 0.7 mg/dL (ref 0.2–1.2)
Total Protein: 7.5 g/dL (ref 6.4–8.3)

## 2017-07-23 NOTE — Progress Notes (Signed)
Radiation Oncology         [phone]) [phone] ________________________________  Name: [name]        MRN: [id number]  Date of Service: 07/23/2017 DOB: 22-Feb-1946  KG:URKYHCW, Margaretha Sheffield, MD  [name], MD     REFERRING PHYSICIAN: [name], MD   DIAGNOSIS: The encounter diagnosis was Malignant neoplasm of lower-inner quadrant of right breast of female, estrogen receptor positive ([place]).   HISTORY OF PRESENT ILLNESS: [name] is a 72 y.o. female seen in the multidisciplinary breast clinic for a new diagnosis of right breast cancer. The patient was noted to have screening detected assymetry. She has had a prior excision in 2012 and 2013 in the right breast. She had diagnostic imaging following her most recent mammogram and at 3:00 there was a 6 mm mass. Her axilla was negative for adenopathy. She underwent a biopsy on 07/16/17 which revealed a grade 2, invasive ductal carcinoma with DCIS. Her tumor was ER/PR positive, HER2 negative, with a Ki 67 of 10%. She comes today to discuss options of treatment for her cancer.    PREVIOUS RADIATION THERAPY: No   PAST MEDICAL HISTORY:  Past Medical History:  Diagnosis Date  . Arthritis   . GERD (gastroesophageal reflux disease)    mild- TUMS used sparingly  . Hyperlipidemia   . Hypertension   . PONV (postoperative nausea and vomiting)    also "passed out with cyst removal from neck"superficial"  . Syncope    x2 episodes-evaluated and findings normal- "usually related to seeing blood or thought of it"       PAST SURGICAL HISTORY: Past Surgical History:  Procedure Laterality Date  . BREAST SURGERY  08/27/11   right lumpectomy w/needle localization  . BUNIONECTOMY Bilateral 2000   bilateral -hardware remains  . CYST REMOVAL NECK     sebaceous  cyst from neck  . DILATION AND CURETTAGE OF UTERUS    . INTRAUTERINE DEVICE INSERTION    . IUD REMOVAL    . LAPAROSCOPIC APPENDECTOMY N/A 03/27/2017   Procedure: APPENDECTOMY LAPAROSCOPIC;   Surgeon: Alphonsa Overall, MD;  Location: [facility];  Service: General;  Laterality: N/A;  . Removal Right Straphenous vein  2011  . TONSILLECTOMY     child  . TOTAL HIP ARTHROPLASTY Right 11/29/2015   Procedure: RIGHT TOTAL HIP ARTHROPLASTY ANTERIOR APPROACH;  Surgeon: Gaynelle Arabian, MD;  Location: [facility];  Service: Orthopedics;  Laterality: Right;     FAMILY HISTORY:  Family History  Problem Relation Age of Onset  . Cancer Paternal Aunt        Breast     SOCIAL HISTORY:  reports that she quit smoking about 31 years ago. She has a 5.00 pack-year smoking history. She has never used smokeless tobacco. She reports that she drinks alcohol. She reports that she does not use drugs. The patient is married and lives in [place]. She is a retired Pharmacist, hospital.  ALLERGIES: Patient has no known allergies.   MEDICATIONS:  Current Outpatient Medications  Medication Sig Dispense Refill  . atorvastatin (LIPITOR) 80 MG tablet Take 80 mg by mouth every evening.    Marland Kitchen FINACEA 15 % cream Apply 1 application topically 2 (two) times daily. Applied to nose.  0  . MICARDIS HCT 40-12.5 MG per tablet Take 1 tablet by mouth daily.     . Multiple Vitamins-Minerals (WOMENS MULTIVITAMIN PO) Take 1 tablet by mouth daily.    . multivitamin-lutein (OCUVITE-LUTEIN) CAPS capsule Take 1 capsule by mouth daily.    Marland Kitchen  Omega-3 Fatty Acids (FISH OIL) 1200 MG CAPS Take 1,200 mg by mouth daily.    . traMADol (ULTRAM) 50 MG tablet Take 1-2 tablets (50-100 mg total) by mouth every 6 (six) hours as needed for moderate pain (mild pain). (Patient not taking: Reported on 07/23/2017) 15 tablet 0  . ZETIA 10 MG tablet Take 10 mg by mouth every evening.      No current facility-administered medications for this encounter.      REVIEW OF SYSTEMS: On review of systems, the patient reports that she is doing well overall. She denies any chest pain, shortness of breath, cough, fevers, chills, night sweats, unintended weight changes. She denies  any bowel or bladder disturbances, and denies abdominal pain, nausea or vomiting. She denies any new musculoskeletal or joint aches or pains. A complete review of systems is obtained and is otherwise negative.     PHYSICAL EXAM:  Wt Readings from Last 3 Encounters:  07/23/17 184 lb 6.4 oz (83.6 kg)  03/28/17 181 lb (82.1 kg)  11/29/15 187 lb (84.8 kg)   Temp Readings from Last 3 Encounters:  07/23/17 98.1 F (36.7 C) (Oral)  03/29/17 97.8 F (36.6 C) (Oral)  12/01/15 98.8 F (37.1 C) (Oral)   BP Readings from Last 3 Encounters:  07/23/17 (!) 141/89  03/29/17 120/79  12/01/15 (!) 107/59   Pulse Readings from Last 3 Encounters:  07/23/17 95  03/29/17 67  12/01/15 73     In general this is a well appearing caucasian female in no acute distress. She is alert and oriented x4 and appropriate throughout the examination. HEENT reveals that the patient is normocephalic, atraumatic. EOMs are intact. PERRLA. Skin is intact without any evidence of gross lesions. Cardiovascular exam reveals a regular rate and rhythm, no clicks rubs or murmurs are auscultated. Chest is clear to auscultation bilaterally. Lymphatic assessment is performed and does not reveal any adenopathy in the cervical, supraclavicular, axillary, or inguinal chains. Bilateral breast exam is performed and reveals ecchymosis in the lower inner quadrant of the right breast with induration deep to this. No mass is noted in the left breast, and no nipple bleeding or discharge is noted. Abdomen has active bowel sounds in all quadrants and is intact. The abdomen is soft, non tender, non distended. Lower extremities are negative for pretibial pitting edema, deep calf tenderness, cyanosis or clubbing.   ECOG = 0  0 - Asymptomatic (Fully active, able to carry on all predisease activities without restriction)  1 - Symptomatic but completely ambulatory (Restricted in physically strenuous activity but ambulatory and able to carry out work  of a light or sedentary nature. For example, light housework, office work)  2 - Symptomatic, <50% in bed during the day (Ambulatory and capable of all self care but unable to carry out any work activities. Up and about more than 50% of waking hours)  3 - Symptomatic, >50% in bed, but not bedbound (Capable of only limited self-care, confined to bed or chair 50% or more of waking hours)  4 - Bedbound (Completely disabled. Cannot carry on any self-care. Totally confined to bed or chair)  5 - Death   Eustace Pen MM, Creech RH, Tormey DC, et al. [phone]). "Toxicity and response criteria of the [facility]". [place] Oncol. 5 (6): 649-55    LABORATORY DATA:  Lab Results  Component Value Date   WBC 6.3 07/23/2017   HGB 14.5 07/23/2017   HCT 42.8 07/23/2017   MCV 94.2 07/23/2017  PLT 202 07/23/2017   Lab Results  Component Value Date   NA 141 07/23/2017   K 4.1 07/23/2017   CL 105 07/23/2017   CO2 29 07/23/2017   Lab Results  Component Value Date   ALT 21 07/23/2017   AST 20 07/23/2017   ALKPHOS 74 07/23/2017   BILITOT 0.7 07/23/2017      RADIOGRAPHY: No results found.     IMPRESSION/PLAN: 1. 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 Dr. Lisbeth Renshaw discusses the pathology findings and reviews the nature of invasive breast disease. The consensus from the breast conference includes breast conservation with lumpectomy with  sentinel node biopsy. Depending on the size of the final tumor measurements rendered by pathology, the tumor may be tested for Oncotype Dx score to determine a role for systemic therapy. Provided that chemotherapy is not indicated, the patient's course would then be followed by external radiotherapy to the breast followed by antiestrogen therapy. We discussed the risks, benefits, short, and long term effects of radiotherapy, and the patient is interested in proceeding. Dr. Lisbeth Renshaw discusses the  delivery and logistics of radiotherapy and anticipates a course of 4 weeks of radiotherapy. We will see her back about 2 weeks after surgery to discuss the simulation process and anticipate we starting radiotherapy about 4-6 weeks after surgery.    The above documentation reflects my direct findings during this shared patient visit. Please see the separate note by Dr. Lisbeth Renshaw on this date for the remainder of the patient's plan of care.    Carola Rhine, PAC

## 2017-07-23 NOTE — Therapy (Signed)
[place], [place], [address] Phone: [phone]   Fax:  [phone]  Physical Therapy Evaluation  Patient Details  Name: [name] MRN: [id number] Date of Birth: 1946-01-10 Referring Provider: Dr. [name]   Encounter Date: 07/23/2017  PT End of Session - 07/23/17 2030    Visit Number  1    Number of Visits  2    Date for PT Re-Evaluation  09/17/17    PT Start Time  1400    PT Stop Time  0947 Also saw pt from [phone] for a total of 27 minutes    PT Time Calculation (min)  10 min    Activity Tolerance  Patient tolerated treatment well    Behavior During Therapy  [facility] for tasks assessed/performed       Past Medical History:  Diagnosis Date  . Arthritis   . GERD (gastroesophageal reflux disease)    mild- TUMS used sparingly  . Hyperlipidemia   . Hypertension   . PONV (postoperative nausea and vomiting)    also "passed out with cyst removal from neck"superficial"  . Syncope    x2 episodes-evaluated and findings normal- "usually related to seeing blood or thought of it"    Past Surgical History:  Procedure Laterality Date  . ABDOMINAL HYSTERECTOMY    . BREAST SURGERY  08/27/11   right lumpectomy w/needle localization  . BUNIONECTOMY Bilateral 2000   bilateral -hardware remains  . CYST REMOVAL NECK     sebaceous  cyst from neck  . DILATION AND CURETTAGE OF UTERUS    . INTRAUTERINE DEVICE INSERTION    . IUD REMOVAL    . LAPAROSCOPIC APPENDECTOMY N/A 03/27/2017   Procedure: APPENDECTOMY LAPAROSCOPIC;  Surgeon: Alphonsa Overall, MD;  Location: [facility];  Service: General;  Laterality: N/A;  . Removal Right Straphenous vein  2011  . TONSILLECTOMY     child  . TOTAL HIP ARTHROPLASTY Right 11/29/2015   Procedure: RIGHT TOTAL HIP ARTHROPLASTY ANTERIOR APPROACH;  Surgeon: Gaynelle Arabian, MD;  Location: [facility];  Service: Orthopedics;  Laterality: Right;    There were no vitals filed for this  visit.   Subjective Assessment - 07/23/17 2023    Subjective  Patient reports she is here today to be seen by her medical team for her newly diagnosed right breast cancer.    Patient is accompained by:  Family member    Pertinent History  Patient was diagnosed on 06/16/17 with right grade II invasive ductal carcinoma with DCIS breast cancer. It measures 6 mm and is located in the upper inner quadrant of her right breast. It is ER/PR positive and HER2 negative with a Ki67 of 10%.    Patient Stated Goals  Reduce lymphedema risk and learn post op shoulder ROM HEP    Currently in Pain?  No/denies         [facility] PT Assessment - 07/23/17 0001      Assessment   Medical Diagnosis  Right breast cancer    Referring Provider  Dr. [name]    Onset Date/Surgical Date  06/16/17    Hand Dominance  Right    Prior Therapy  none      Precautions   Precautions  Other (comment)    Precaution Comments  active cancer      Restrictions   Weight Bearing Restrictions  No      Balance Screen   Has the patient fallen in the past 6 months  No    Has the patient had a decrease in activity level because of a fear of falling?   No    Is the patient reluctant to leave their home because of a fear of falling?   No      Home Environment   Living Environment  Private residence    Living Arrangements  Spouse/significant other    Available Help at Discharge  Family      Prior Function   Level of [place]  Retired    Leisure  She walks daily      Cognition   Overall Cognitive Status  Within Functional Limits for tasks assessed      Posture/Postural Control   Posture/Postural Control  Postural limitations    Postural Limitations  Rounded Shoulders;Forward head;Increased thoracic kyphosis      ROM / Strength   AROM / PROM / Strength  AROM;Strength      AROM   AROM Assessment Site  Shoulder;Cervical    Right/Left Shoulder  Right;Left    Right Shoulder Extension  60  Degrees    Right Shoulder Flexion  155 Degrees    Right Shoulder ABduction  160 Degrees    Right Shoulder Internal Rotation  58 Degrees    Right Shoulder External Rotation  71 Degrees    Left Shoulder Extension  52 Degrees    Left Shoulder Flexion  139 Degrees    Left Shoulder ABduction  153 Degrees    Left Shoulder Internal Rotation  62 Degrees    Left Shoulder External Rotation  77 Degrees    Cervical Flexion  WNL    Cervical Extension  WNL    Cervical - Right Side Bend  WNL    Cervical - Left Side Bend  WNL    Cervical - Right Rotation  WNL    Cervical - Left Rotation  WNL      Strength   Overall Strength  Within functional limits for tasks performed        LYMPHEDEMA/ONCOLOGY QUESTIONNAIRE - 07/23/17 2029      Type   Cancer Type  Right breast      Lymphedema Assessments   Lymphedema Assessments  Upper extremities      Right Upper Extremity Lymphedema   10 cm Proximal to Olecranon Process  30.4 cm    Olecranon Process  25.7 cm    10 cm Proximal to Ulnar Styloid Process  22.4 cm    Just Proximal to Ulnar Styloid Process  16.6 cm    Across Hand at [employer]  20 cm    At [place] of 2nd Digit  6.9 cm      Left Upper Extremity Lymphedema   10 cm Proximal to Olecranon Process  31 cm    Olecranon Process  26 cm    10 cm Proximal to Ulnar Styloid Process  21.8 cm    Just Proximal to Ulnar Styloid Process  16.7 cm    Across Hand at [employer]  19.5 cm    At [place] of 2nd Digit  6.8 cm             Objective measurements completed on examination: See above findings.    Patient was instructed today in a home exercise program today for post op shoulder range of motion. These included active assist shoulder flexion in sitting, scapular retraction, wall walking with shoulder abduction, and hands behind head external rotation.  She was  encouraged to do these twice a day, holding 3 seconds and repeating 5 times when permitted by her physician.      PT Education  - 07/23/17 2030    Education provided  Yes    Education Details  Lymphedema risk reduction and post op shoulder ROM HEP    Person(s) Educated  Patient;Spouse    Methods  Explanation;Demonstration;Handout    Comprehension  Returned demonstration;Verbalized understanding          PT Long Term Goals - 07/23/17 2108      PT LONG TERM GOAL #1   Title  Patient will demonstrate she has returned to baseline related to shoulder ROM and function post operatively.    Time  [facility] Goals - 07/23/17 2107      Patient will be able to verbalize understanding of pertinent lymphedema risk reduction practices relevant to her diagnosis specifically related to skin care.   Time  1    Period  Days    Status  Achieved      Patient will be able to return demonstrate and/or verbalize understanding of the post-op home exercise program related to regaining shoulder range of motion.   Time  1    Period  Days    Status  Achieved      Patient will be able to verbalize understanding of the importance of attending the postoperative After Breast Cancer Class for further lymphedema risk reduction education and therapeutic exercise.   Time  1    Period  Days    Status  Achieved            Plan - 07/23/17 2031    Clinical Impression Statement  Patient was diagnosed on 06/16/17 with right grade II invasive ductal carcinoma with DCIS breast cancer. It measures 6 mm and is located in the upper inner quadrant of her right breast. It is ER/PR positive and HER2 negative with a Ki67 of 10%. Her multidisciplinary medical team met prior to her assessments to determine a recommended treatment plan. She is planning to have a right lumpectomy and sentinel node biopsy followed by Oncotype testing if mass is > 1 cm, radiation, and anti-estrogen therapy. She will benefit from a post op PT reassessment to determine needs.    Clinical Presentation  Stable    Clinical Decision  Making  Low    Rehab Potential  Excellent    Clinical Impairments Affecting Rehab Potential  none    PT Frequency  -- Eval and 1 f/u visit    PT Treatment/Interventions  ADLs/Self Care Home Management;Therapeutic exercise;Patient/family education    PT Next Visit Plan  Will reassess 3-4 weeks post op to determine PT needs    PT Home Exercise Plan  Post op shoulder ROM HEP    Consulted and Agree with Plan of Care  Patient;Family member/caregiver    Family Member Consulted  Husband       Patient will benefit from skilled therapeutic intervention in order to improve the following deficits and impairments:  Pain, Postural dysfunction, Impaired UE functional use, Decreased knowledge of precautions, Decreased range of motion  Visit Diagnosis: Malignant neoplasm of upper-inner quadrant of right breast in female, estrogen receptor positive ([place]) - Plan: PT plan of care cert/re-cert  Abnormal posture - Plan: PT plan of care cert/re-cert   Patient will follow up at outpatient cancer rehab 3-4 weeks following surgery.  If the patient requires physical therapy at that time, a specific plan will be dictated and sent to the referring physician for approval. The patient was educated today on appropriate basic range of motion exercises to begin post operatively and the importance of attending the After Breast Cancer class following surgery.  Patient was educated today on lymphedema risk reduction practices as it pertains to recommendations that will benefit the patient immediately following surgery.  She verbalized good understanding.      Problem List Patient Active Problem List   Diagnosis Date Noted  . Malignant neoplasm of lower-inner quadrant of right breast of female, estrogen receptor positive ([place]) 07/22/2017  . Appendicitis with perforation 03/27/2017  . OA (osteoarthritis) of hip 11/29/2015    Annia Friendly, PT 07/23/17 9:10 PM  [place], [place], [address] Phone: [phone]   Fax:  [phone]  Name: MOLLYE GUINTA MRN: [id number] Date of Birth: 18-Nov-1945

## 2017-07-23 NOTE — Progress Notes (Signed)
Nutrition Assessment  Reason for Assessment:  Pt seen in Breast Clinic  ASSESSMENT:   72 year old female with new diagnosis of breast cancer.  Past medical history reviewed  Patient reports good appetite.  Eating snack during visit.  Medications:  reviewed  Labs: reviewed  Anthropometrics:   Height: 64 inches Weight: 184 lb BMI: 31   NUTRITION DIAGNOSIS: Food and nutrition related knowledge deficit related to new diagnosis of breast cancer as evidenced by no prior need for nutrition related information.  INTERVENTION:   Discussed and provided packet of information regarding nutritional tips for breast cancer patients.  Questions answered.  Teachback method used.  Contact information provided and patient knows to contact me with questions/concerns.    MONITORING, EVALUATION, and GOAL: Pt will consume a healthy plant based diet to maintain lean body mass throughout treatment.   Fern Canova B. Zenia Resides, [place], [place] Registered Dietitian [phone] (pager)

## 2017-07-23 NOTE — Patient Instructions (Signed)

## 2017-07-24 ENCOUNTER — Telehealth: Payer: Self-pay | Admitting: Hematology

## 2017-07-24 ENCOUNTER — Other Ambulatory Visit: Payer: Self-pay | Admitting: General Surgery

## 2017-07-24 NOTE — Telephone Encounter (Signed)
No LOS 5/8 °

## 2017-07-25 ENCOUNTER — Other Ambulatory Visit: Payer: Self-pay

## 2017-07-25 ENCOUNTER — Encounter (HOSPITAL_BASED_OUTPATIENT_CLINIC_OR_DEPARTMENT_OTHER): Payer: Self-pay | Admitting: *Deleted

## 2017-07-28 ENCOUNTER — Encounter: Payer: Self-pay | Admitting: General Practice

## 2017-07-28 ENCOUNTER — Encounter (HOSPITAL_BASED_OUTPATIENT_CLINIC_OR_DEPARTMENT_OTHER)
Admission: RE | Admit: 2017-07-28 | Discharge: 2017-07-28 | Disposition: A | Discharge status: Home / Self Care | Payer: Medicare Other | Source: Ambulatory Visit | Attending: General Surgery | Admitting: General Surgery

## 2017-07-28 ENCOUNTER — Other Ambulatory Visit: Payer: Self-pay

## 2017-07-28 DIAGNOSIS — E785 Hyperlipidemia, unspecified: Secondary | ICD-10-CM | POA: Diagnosis not present

## 2017-07-28 DIAGNOSIS — Z170 Estrogen receptor positive status [ER+]: Secondary | ICD-10-CM | POA: Diagnosis not present

## 2017-07-28 DIAGNOSIS — K219 Gastro-esophageal reflux disease without esophagitis: Secondary | ICD-10-CM | POA: Diagnosis not present

## 2017-07-28 DIAGNOSIS — I10 Essential (primary) hypertension: Secondary | ICD-10-CM | POA: Diagnosis not present

## 2017-07-28 DIAGNOSIS — Z79899 Other long term (current) drug therapy: Secondary | ICD-10-CM | POA: Diagnosis not present

## 2017-07-28 DIAGNOSIS — C50211 Malignant neoplasm of upper-inner quadrant of right female breast: Secondary | ICD-10-CM | POA: Diagnosis not present

## 2017-07-28 DIAGNOSIS — Z87891 Personal history of nicotine dependence: Secondary | ICD-10-CM | POA: Diagnosis not present

## 2017-07-28 NOTE — Progress Notes (Signed)
[facility] Psychosocial Distress Screening [place] presented to [facility] on 07-23-2017 and received full packet Support Center team/resources, completing distress screen per protocol.  The patient scored a 5 on the Psychosocial Distress Thermometer which indicates moderate distress. LVM of introduction with encouragement to return call.   [facility] DISTRESS SCREENING 07/28/2017  Screening Type Initial Screening  Distress experienced in past week (1-10) 5  Emotional problem type Adjusting to illness  Information Concerns Type Lack of info about diagnosis;Lack of info about treatment  Physical Problem type Swollen arms/legs;Other (comment)  Referral to support programs Yes    Follow up needed: Yes.  Given procedure appt on 5-15, plan to f/u by handwritten note of encouragement.   [place], [place], [place] Pager [phone] Voicemail [phone]

## 2017-07-28 NOTE — Pre-Procedure Instructions (Signed)
Pt given Ensure and instructed to drink by 0945 day of surgery with teach back method.

## 2017-07-28 NOTE — H&P (Signed)
[name] Documented: 07/23/2017 7:44 AM Location: [facility] Patient #: [phone] DOB: June 24, 1945 Married / Language: English / Race: White Female   History of Present Illness [name] MD; 07/24/2017 1:32 AM) The patient is a 72 year old female who presents with breast cancer. Pt is a 72 yo F referred by Dr. Marcelo Baldy for a new diagnosis of left breast cancer. She had an area of screening detected architectural distortion on the right. Incidentally, she has a history of prior right lumpectomy for benign disease in 2012. She underwent diagnostic imaging that showed a 6 mm area of abnormality at 3 o'clock. A core needle biopsy was performed which demonstrated a grade 2 invasive ductal carcinoma with DCIS, ER/PR positive, her 2 negative, Ki 67 10%. Of note, she had some clip migration due to hematoma.   She wants to make it known that SHE IS A FAINTER!!!  She has no personal or family history of cancer. Her mother had a benign brain tumor. She is a retired Pharmacist, hospital. She is accompanied by her husband. She is a former smoker that quit in 1975. She drinks around 4 alcoholic beverages per week. She does not use drugs. She had menarche at age 57. She is post menopausal wtih menopause occuring around age 67 or 23. She is a G2P2 wtih first child at age 24. She has not used hormonal contraception or HRT. She is up to date with colonoscopy.   pathology Diagnosis Breast, right, needle core biopsy, 3 o'clock, 7cm - INVASIVE DUCTAL CARCINOMA, SEE COMMENT. - DUCTAL CARCINOMA IN SITU WITH NECROSIS. Microscopic Comment The carcinoma appears grade 2. Estrogen Receptor: 100%, POSITIVE, STRONG STAINING INTENSITY Progesterone Receptor: 80%, POSITIVE, STRONG STAINING INTENSITY Proliferation Marker Ki67: 10% HER2 - NEGATIVE RATIO OF HER2/CEP17 SIGNALS 1.13 AVERAGE HER2 COPY NUMBER PER CELL 1.70   Recent Results (from the past 2160 hour(s)) CMP ([place] only) Status:  None Collection Time: 07/23/17 12:11 PM Result Value Ref Range Sodium 141 136 - 145 mmol/L Potassium 4.1 3.5 - 5.1 mmol/L Chloride 105 98 - 109 mmol/L CO2 29 22 - 29 mmol/L Glucose, Bld 95 70 - 140 mg/dL BUN 13 7 - 26 mg/dL Creatinine 0.86 0.60 - 1.10 mg/dL Calcium 10.0 8.4 - 10.4 mg/dL Total Protein 7.5 6.4 - 8.3 g/dL Albumin 4.3 3.5 - 5.0 g/dL AST 20 5 - 34 U/L ALT 21 0 - 55 U/L Alkaline Phosphatase 74 40 - 150 U/L Total Bilirubin 0.7 0.2 - 1.2 mg/dL GFR, Est Non Af Am >60 >60 mL/min GFR, Est AFR Am >60 >60 mL/min Comment: (NOTE) The eGFR has been calculated using the CKD EPI equation. This calculation has not been validated in all clinical situations. eGFR's persistently <60 mL/min signify possible Chronic Kidney Disease. Anion gap 7 3 - 11 Comment: Performed at [facility], [address], [place], [place] [address] CBC with Differential ([place] Only) Status: None Collection Time: 07/23/17 12:11 PM Result Value Ref Range WBC Count 6.3 3.9 - 10.3 K/uL RBC 4.54 3.70 - 5.45 MIL/uL Hemoglobin 14.5 11.6 - 15.9 g/dL HCT 42.8 34.8 - 46.6 % MCV 94.2 79.5 - 101.0 fL MCH 31.9 25.1 - 34.0 pg MCHC 33.8 31.5 - 36.0 g/dL RDW 13.8 11.2 - 14.5 % Platelet Count 202 145 - 400 K/uL Neutrophils Relative % 67 % Neutro Abs 4.1 1.5 - 6.5 K/uL Lymphocytes Relative 24 % Lymphs Abs 1.5 0.9 - 3.3 K/uL Monocytes Relative 7 % Monocytes Absolute 0.5 0.1 - 0.9 K/uL  Eosinophils Relative 1 % Eosinophils Absolute 0.1 0.0 - 0.5 K/uL Basophils Relative 1 % Basophils Absolute 0.1 0.0 - 0.1 K/uL Comment: Performed at [facility], [place] [address], [place], [place] [address]    Medication History Tawni Pummel, RN; 07/23/2017 7:45 AM) Medications Reconciled    Review of Systems [name] MD; 07/24/2017 1:34 AM) All other systems negative  Note: except for cough and prior small pelvic fracture site.   Vitals [name] MD;  07/24/2017 1:40 AM) 07/24/2017 1:39 AM Weight: 184.4 lb Height: 64in Body Surface Area: 1.89 m Body Mass Index: 31.65 kg/m  Temp.: 98.59F  Pulse: 95 (Regular)  Resp.: 18 (Unlabored)  BP: 141/89 (Sitting, Left Arm, Standard)       Physical Exam [name] MD; 07/24/2017 1:35 AM) General Mental Status-Alert. General Appearance-Consistent with stated age. Hydration-Well hydrated. Voice-Normal.  Head and Neck Head-normocephalic, atraumatic with no lesions or palpable masses. Trachea-midline. Thyroid Gland Characteristics - normal size and consistency.  Eye Eyeball - Bilateral-Extraocular movements intact. Sclera/Conjunctiva - Bilateral-No scleral icterus.  Chest and Lung Exam Chest and lung exam reveals -quiet, even and easy respiratory effort with no use of accessory muscles and on auscultation, normal breath sounds, no adventitious sounds and normal vocal resonance. Inspection Chest Wall - Normal. Back - normal.  Breast Note: breasts reasonably symmetric bilaterally. ptotic. right lumpectomy scar in LOQ of breast. no nipple retraction or nipple discharge. no skin dimpling. medial bruising and soreness on the left. no LAD. no palpable masses. left breast wtihout positive findings.   Cardiovascular Cardiovascular examination reveals -normal heart sounds, regular rate and rhythm with no murmurs and normal pedal pulses bilaterally.  Abdomen Inspection Inspection of the abdomen reveals - No Hernias. Palpation/Percussion Palpation and Percussion of the abdomen reveal - Soft, Non Tender, No Rebound tenderness, No Rigidity (guarding) and No hepatosplenomegaly. Auscultation Auscultation of the abdomen reveals - Bowel sounds normal.  Neurologic Neurologic evaluation reveals -alert and oriented x 3 with no impairment of recent or remote memory. Mental Status-Normal.  Musculoskeletal Global Assessment -Note: no gross  deformities.  Normal Exam - Left-Upper Extremity Strength Normal and Lower Extremity Strength Normal. Normal Exam - Right-Upper Extremity Strength Normal and Lower Extremity Strength Normal.  Lymphatic Head & Neck  General Head & Neck Lymphatics: Bilateral - Description - Normal. Axillary  General Axillary Region: Bilateral - Description - Normal. Tenderness - Non Tender. Femoral & Inguinal  Generalized Femoral & Inguinal Lymphatics: Bilateral - Description - No Generalized lymphadenopathy.    Assessment & Plan [name] MD; 07/24/2017 1:39 AM) MALIGNANT NEOPLASM OF UPPER-INNER QUADRANT OF RIGHT BREAST IN FEMALE, ESTROGEN RECEPTOR POSITIVE (C50.211) Impression: Pt has a new diagnosis of cT1bN0 right breast cancer. Of note, she had clip migration of around 1 cm from her original biopsy site.  She is a good candidate for breast conservation. I will plan a seed localized lumpectomy wtih sentinel lymph node biopsy. this will be followed by a possible oncotype depending on final tumor size and radiation. After that, she will receive antihormonal treatment.  She has a wedding to attend in mid June. We will try to do this next week in order to maximize her recovery time.  The surgical procedure was described to the patient. I discussed the incision type and location and that we would need radiology involved on with a wire or seed marker and/or sentinel node.  The risks and benefits of the procedure were described to the patient and she wishes to proceed.  We discussed the risks bleeding, infection, damage to other structures, need for further procedures/surgeries. We discussed the risk of seroma. The patient was advised if the area in the breast in cancer, we may need to go back to surgery for additional tissue to obtain negative margins or for a lymph node biopsy. The patient was advised that these are the most common complications, but that others can occur as well. They were advised  against taking aspirin or other anti-inflammatory agents/blood thinners the week before surgery. Current Plans You are being scheduled for surgery- Our schedulers will call you.  You should hear from our office's scheduling department within 5 working days about the location, date, and time of surgery. We try to make accommodations for patient's preferences in scheduling surgery, but sometimes the OR schedule or the surgeon's schedule prevents [place] from making those accommodations.  If you have not heard from our office [phone]) in 5 working days, call the office and ask for your surgeon's nurse.  If you have other questions about your diagnosis, plan, or surgery, call the office and ask for your surgeon's nurse.  Pt Education - [facility] Breast Cancer Information Given - [facility] "Breast Journey" Package Pt Education - [facility]b breast cancer surgery: discussed with patient and provided information.   Signed by [name], MD (07/24/2017 1:42 AM)

## 2017-07-30 ENCOUNTER — Ambulatory Visit (HOSPITAL_COMMUNITY)
Admission: RE | Admit: 2017-07-30 | Discharge: 2017-07-30 | Disposition: A | Discharge status: Home / Self Care | Payer: Medicare Other | Source: Ambulatory Visit | Attending: General Surgery | Admitting: General Surgery

## 2017-07-30 ENCOUNTER — Ambulatory Visit (HOSPITAL_BASED_OUTPATIENT_CLINIC_OR_DEPARTMENT_OTHER): Payer: Medicare Other | Admitting: Anesthesiology

## 2017-07-30 ENCOUNTER — Encounter (HOSPITAL_BASED_OUTPATIENT_CLINIC_OR_DEPARTMENT_OTHER): Payer: Self-pay | Admitting: Anesthesiology

## 2017-07-30 ENCOUNTER — Other Ambulatory Visit: Payer: Self-pay

## 2017-07-30 ENCOUNTER — Encounter: Payer: Self-pay | Admitting: General Practice

## 2017-07-30 ENCOUNTER — Ambulatory Visit (HOSPITAL_BASED_OUTPATIENT_CLINIC_OR_DEPARTMENT_OTHER)
Admission: RE | Admit: 2017-07-30 | Discharge: 2017-07-30 | Disposition: A | Discharge status: Home / Self Care | Payer: Medicare Other | Source: Ambulatory Visit | Attending: General Surgery | Admitting: General Surgery

## 2017-07-30 ENCOUNTER — Encounter (HOSPITAL_BASED_OUTPATIENT_CLINIC_OR_DEPARTMENT_OTHER)
Admission: RE | Disposition: A | Discharge status: Home / Self Care | Payer: Self-pay | Source: Ambulatory Visit | Attending: General Surgery

## 2017-07-30 DIAGNOSIS — K219 Gastro-esophageal reflux disease without esophagitis: Secondary | ICD-10-CM | POA: Diagnosis not present

## 2017-07-30 DIAGNOSIS — C50211 Malignant neoplasm of upper-inner quadrant of right female breast: Secondary | ICD-10-CM | POA: Insufficient documentation

## 2017-07-30 DIAGNOSIS — I10 Essential (primary) hypertension: Secondary | ICD-10-CM | POA: Insufficient documentation

## 2017-07-30 DIAGNOSIS — E785 Hyperlipidemia, unspecified: Secondary | ICD-10-CM | POA: Insufficient documentation

## 2017-07-30 DIAGNOSIS — Z170 Estrogen receptor positive status [ER+]: Secondary | ICD-10-CM | POA: Insufficient documentation

## 2017-07-30 DIAGNOSIS — Z87891 Personal history of nicotine dependence: Secondary | ICD-10-CM | POA: Insufficient documentation

## 2017-07-30 DIAGNOSIS — Z79899 Other long term (current) drug therapy: Secondary | ICD-10-CM | POA: Insufficient documentation

## 2017-07-30 HISTORY — DX: Syncope and collapse: R55

## 2017-07-30 HISTORY — DX: Malignant (primary) neoplasm, unspecified: C80.1

## 2017-07-30 HISTORY — PX: BREAST LUMPECTOMY WITH RADIOACTIVE SEED AND SENTINEL LYMPH NODE BIOPSY: SHX6550

## 2017-07-30 SURGERY — BREAST LUMPECTOMY WITH RADIOACTIVE SEED AND SENTINEL LYMPH NODE BIOPSY
Anesthesia: Regional | Site: Breast | Laterality: Right

## 2017-07-30 MED ORDER — LACTATED RINGERS IV SOLN
INTRAVENOUS | Status: DC
  Administered 2017-07-30 (×2): via INTRAVENOUS

## 2017-07-30 MED ORDER — LIDOCAINE HCL (CARDIAC) PF 100 MG/5ML IV SOSY
PREFILLED_SYRINGE | INTRAVENOUS | Status: DC | PRN
  Administered 2017-07-30: 30 mg via INTRAVENOUS

## 2017-07-30 MED ORDER — FENTANYL CITRATE (PF) 100 MCG/2ML IJ SOLN
INTRAMUSCULAR | Status: DC | PRN
  Administered 2017-07-30 (×2): 25 ug via INTRAVENOUS
  Administered 2017-07-30: 50 ug via INTRAVENOUS

## 2017-07-30 MED ORDER — CHLORHEXIDINE GLUCONATE CLOTH 2 % EX PADS: 6.0000 | MEDICATED_PAD | Freq: Once | CUTANEOUS | Status: DC

## 2017-07-30 MED ORDER — MIDAZOLAM HCL 2 MG/2ML IJ SOLN: 1.0000 mg | INTRAMUSCULAR | Status: DC | PRN

## 2017-07-30 MED ORDER — FENTANYL CITRATE (PF) 100 MCG/2ML IJ SOLN
INTRAMUSCULAR | Status: AC
  Filled 2017-07-30: qty 2

## 2017-07-30 MED ORDER — MIDAZOLAM HCL 2 MG/2ML IJ SOLN
INTRAMUSCULAR | Status: AC
  Filled 2017-07-30: qty 2

## 2017-07-30 MED ORDER — TRAMADOL HCL 50 MG PO TABS: 50.0000 mg | ORAL_TABLET | Freq: Four times a day (QID) | ORAL | 0 refills | Status: DC | PRN

## 2017-07-30 MED ORDER — TECHNETIUM TC 99M SULFUR COLLOID FILTERED
1.0000 | Freq: Once | INTRAVENOUS | Status: AC | PRN
Start: 2017-07-30 — End: 2017-07-30
  Administered 2017-07-30: 1 via INTRADERMAL

## 2017-07-30 MED ORDER — OXYCODONE HCL 5 MG PO TABS: 5.0000 mg | ORAL_TABLET | ORAL | 0 refills | Status: DC | PRN

## 2017-07-30 MED ORDER — LIDOCAINE HCL 1 % IJ SOLN
INTRAMUSCULAR | Status: DC | PRN
  Administered 2017-07-30: 20 mL via INTRAMUSCULAR

## 2017-07-30 MED ORDER — FENTANYL CITRATE (PF) 100 MCG/2ML IJ SOLN: 25.0000 ug | INTRAMUSCULAR | Status: DC | PRN

## 2017-07-30 MED ORDER — PHENYLEPHRINE HCL 10 MG/ML IJ SOLN
INTRAMUSCULAR | Status: DC | PRN
  Administered 2017-07-30 (×2): 80 ug via INTRAVENOUS

## 2017-07-30 MED ORDER — DEXAMETHASONE SODIUM PHOSPHATE 4 MG/ML IJ SOLN
INTRAMUSCULAR | Status: DC | PRN
  Administered 2017-07-30: 10 mg via INTRAVENOUS

## 2017-07-30 MED ORDER — ACETAMINOPHEN 500 MG PO TABS
ORAL_TABLET | ORAL | Status: AC
Start: 2017-07-30 — End: ?
  Filled 2017-07-30: qty 2

## 2017-07-30 MED ORDER — PROPOFOL 10 MG/ML IV BOLUS
INTRAVENOUS | Status: DC | PRN
  Administered 2017-07-30: 200 mg via INTRAVENOUS

## 2017-07-30 MED ORDER — CEFAZOLIN SODIUM-DEXTROSE 2-4 GM/100ML-% IV SOLN
INTRAVENOUS | Status: AC
  Filled 2017-07-30: qty 100

## 2017-07-30 MED ORDER — ACETAMINOPHEN 500 MG PO TABS
1000.0000 mg | ORAL_TABLET | ORAL | Status: AC
  Administered 2017-07-30: 1000 mg via ORAL

## 2017-07-30 MED ORDER — CEFAZOLIN SODIUM-DEXTROSE 2-4 GM/100ML-% IV SOLN
2.0000 g | INTRAVENOUS | Status: AC
  Administered 2017-07-30: 2 g via INTRAVENOUS

## 2017-07-30 MED ORDER — GABAPENTIN 300 MG PO CAPS
ORAL_CAPSULE | ORAL | Status: AC
  Filled 2017-07-30: qty 1

## 2017-07-30 MED ORDER — FENTANYL CITRATE (PF) 100 MCG/2ML IJ SOLN
50.0000 ug | INTRAMUSCULAR | Status: DC | PRN
  Administered 2017-07-30: 50 ug via INTRAVENOUS
  Administered 2017-07-30: 100 ug via INTRAVENOUS

## 2017-07-30 MED ORDER — DEXAMETHASONE SODIUM PHOSPHATE 10 MG/ML IJ SOLN
INTRAMUSCULAR | Status: AC
  Filled 2017-07-30: qty 1

## 2017-07-30 MED ORDER — GABAPENTIN 300 MG PO CAPS
300.0000 mg | ORAL_CAPSULE | ORAL | Status: AC
  Administered 2017-07-30: 300 mg via ORAL

## 2017-07-30 MED ORDER — EPHEDRINE SULFATE 50 MG/ML IJ SOLN
INTRAMUSCULAR | Status: DC | PRN
  Administered 2017-07-30: 25 mg via INTRAVENOUS
  Administered 2017-07-30: 10 mg via INTRAVENOUS

## 2017-07-30 MED ORDER — LIDOCAINE HCL (CARDIAC) PF 100 MG/5ML IV SOSY
PREFILLED_SYRINGE | INTRAVENOUS | Status: AC
  Filled 2017-07-30: qty 5

## 2017-07-30 MED ORDER — ROPIVACAINE HCL 5 MG/ML IJ SOLN
INTRAMUSCULAR | Status: DC | PRN
  Administered 2017-07-30: 30 mL via PERINEURAL

## 2017-07-30 MED ORDER — ACETAMINOPHEN 500 MG PO TABS: 1000.0000 mg | ORAL_TABLET | Freq: Three times a day (TID) | ORAL | 2 refills | Status: AC

## 2017-07-30 MED ORDER — ONDANSETRON HCL 4 MG/2ML IJ SOLN
INTRAMUSCULAR | Status: DC | PRN
  Administered 2017-07-30: 4 mg via INTRAVENOUS

## 2017-07-30 MED ORDER — SCOPOLAMINE 1 MG/3DAYS TD PT72: 1.0000 | MEDICATED_PATCH | Freq: Once | TRANSDERMAL | Status: DC | PRN

## 2017-07-30 MED ORDER — ONDANSETRON HCL 4 MG/2ML IJ SOLN: 4.0000 mg | Freq: Once | INTRAMUSCULAR | Status: DC | PRN

## 2017-07-30 MED ORDER — PROPOFOL 10 MG/ML IV BOLUS
INTRAVENOUS | Status: AC
  Filled 2017-07-30: qty 20

## 2017-07-30 MED ORDER — ONDANSETRON HCL 4 MG/2ML IJ SOLN
INTRAMUSCULAR | Status: AC
  Filled 2017-07-30: qty 2

## 2017-07-30 SURGICAL SUPPLY — 59 items
BINDER BREAST XLRG (GAUZE/BANDAGES/DRESSINGS) ×3 IMPLANT
BLADE SURG 10 STRL SS (BLADE) ×3 IMPLANT
BLADE SURG 15 STRL LF DISP TIS (BLADE) ×1 IMPLANT
BLADE SURG 15 STRL SS (BLADE) ×2
BNDG COHESIVE 4X5 TAN STRL (GAUZE/BANDAGES/DRESSINGS) ×3 IMPLANT
CANISTER SUC SOCK COL 7IN (MISCELLANEOUS) IMPLANT
CANISTER SUCT 1200ML W/VALVE (MISCELLANEOUS) ×3 IMPLANT
CHLORAPREP W/TINT 26ML (MISCELLANEOUS) ×3 IMPLANT
CLIP VESOCCLUDE LG 6/CT (CLIP) ×3 IMPLANT
CLIP VESOCCLUDE MED 6/CT (CLIP) ×6 IMPLANT
CLIP VESOCCLUDE SM WIDE 6/CT (CLIP) IMPLANT
CLOSURE WOUND 1/2 X4 (GAUZE/BANDAGES/DRESSINGS) ×1
COVER MAYO STAND STRL (DRAPES) ×3 IMPLANT
COVER PROBE W GEL 5X96 (DRAPES) ×3 IMPLANT
DECANTER SPIKE VIAL GLASS SM (MISCELLANEOUS) IMPLANT
DERMABOND ADVANCED (GAUZE/BANDAGES/DRESSINGS) ×4
DERMABOND ADVANCED .7 DNX12 (GAUZE/BANDAGES/DRESSINGS) ×2 IMPLANT
DEVICE DUBIN W/COMP PLATE 8390 (MISCELLANEOUS) ×3 IMPLANT
DRAPE UTILITY XL STRL (DRAPES) ×3 IMPLANT
DRSG PAD ABDOMINAL 8X10 ST (GAUZE/BANDAGES/DRESSINGS) ×3 IMPLANT
ELECT COATED BLADE 2.86 ST (ELECTRODE) ×3 IMPLANT
ELECT REM PT RETURN 9FT ADLT (ELECTROSURGICAL) ×3
ELECTRODE REM PT RTRN 9FT ADLT (ELECTROSURGICAL) ×1 IMPLANT
GAUZE SPONGE 4X4 12PLY STRL LF (GAUZE/BANDAGES/DRESSINGS) ×3 IMPLANT
GLOVE BIO SURGEON STRL SZ 6 (GLOVE) ×3 IMPLANT
GLOVE BIOGEL PI IND STRL 6.5 (GLOVE) ×1 IMPLANT
GLOVE BIOGEL PI INDICATOR 6.5 (GLOVE) ×2
GOWN STRL REUS W/ TWL LRG LVL3 (GOWN DISPOSABLE) ×1 IMPLANT
GOWN STRL REUS W/TWL 2XL LVL3 (GOWN DISPOSABLE) ×3 IMPLANT
GOWN STRL REUS W/TWL LRG LVL3 (GOWN DISPOSABLE) ×3
KIT MARKER MARGIN INK (KITS) ×3 IMPLANT
LIGHT WAVEGUIDE WIDE FLAT (MISCELLANEOUS) ×3 IMPLANT
NDL SAFETY ECLIPSE 18X1.5 (NEEDLE) IMPLANT
NEEDLE HYPO 18GX1.5 SHARP (NEEDLE)
NEEDLE HYPO 25X1 1.5 SAFETY (NEEDLE) ×3 IMPLANT
NS IRRIG 1000ML POUR BTL (IV SOLUTION) ×3 IMPLANT
PACK BASIN DAY SURGERY FS (CUSTOM PROCEDURE TRAY) ×3 IMPLANT
PACK UNIVERSAL I (CUSTOM PROCEDURE TRAY) ×3 IMPLANT
PENCIL BUTTON HOLSTER BLD 10FT (ELECTRODE) ×3 IMPLANT
SLEEVE SCD COMPRESS KNEE MED (MISCELLANEOUS) ×3 IMPLANT
SPONGE LAP 18X18 RF (DISPOSABLE) ×6 IMPLANT
STAPLER VISISTAT 35W (STAPLE) IMPLANT
STOCKINETTE IMPERVIOUS LG (DRAPES) ×3 IMPLANT
STRIP CLOSURE SKIN 1/2X4 (GAUZE/BANDAGES/DRESSINGS) ×2 IMPLANT
SUT ETHILON 2 0 FS 18 (SUTURE) IMPLANT
SUT MNCRL AB 4-0 PS2 18 (SUTURE) ×3 IMPLANT
SUT MON AB 5-0 PS2 18 (SUTURE) IMPLANT
SUT SILK 2 0 SH (SUTURE) IMPLANT
SUT VIC AB 2-0 SH 27 (SUTURE) ×3
SUT VIC AB 2-0 SH 27XBRD (SUTURE) ×1 IMPLANT
SUT VIC AB 3-0 SH 27 (SUTURE) ×3
SUT VIC AB 3-0 SH 27X BRD (SUTURE) ×1 IMPLANT
SUT VICRYL 3-0 CR8 SH (SUTURE) ×3 IMPLANT
SYR CONTROL 10ML LL (SYRINGE) ×3 IMPLANT
TOWEL OR 17X24 6PK STRL BLUE (TOWEL DISPOSABLE) ×3 IMPLANT
TOWEL OR NON WOVEN STRL DISP B (DISPOSABLE) IMPLANT
TUBE CONNECTING 20'X1/4 (TUBING) ×1
TUBE CONNECTING 20X1/4 (TUBING) ×2 IMPLANT
YANKAUER SUCT BULB TIP NO VENT (SUCTIONS) ×3 IMPLANT

## 2017-07-30 NOTE — Anesthesia Procedure Notes (Deleted)
Procedure Name: LMA Insertion Date/Time: 07/30/2017 2:29 PM Performed by: Marrianne Mood, CRNA Pre-anesthesia Checklist: Patient identified, Emergency Drugs available, Suction available, Patient being monitored and Timeout performed Patient Re-evaluated:Patient Re-evaluated prior to induction Oxygen Delivery Method: Circle system utilized Preoxygenation: Pre-oxygenation with 100% oxygen Induction Type: IV induction Ventilation: Mask ventilation without difficulty LMA: LMA inserted LMA Size: 4.0 Number of attempts: 1 Airway Equipment and Method: Bite block Placement Confirmation: positive ETCO2 Tube secured with: Tape Dental Injury: Teeth and Oropharynx as per pre-operative assessment

## 2017-07-30 NOTE — Interval H&P Note (Signed)
History and Physical Interval Note:  07/30/2017 12:12 PM  [name]  has presented today for surgery, with the diagnosis of right breast cancer  The various methods of treatment have been discussed with the patient and family. After consideration of risks, benefits and other options for treatment, the patient has consented to  Procedure(s): BREAST LUMPECTOMY WITH RADIOACTIVE SEED AND SENTINEL LYMPH NODE BIOPSY ERAS PATHWAY (Right) as a surgical intervention .  The patient's history has been reviewed, patient examined, no change in status, stable for surgery.  I have reviewed the patient's chart and labs.  Questions were answered to the patient's satisfaction.     [name]

## 2017-07-30 NOTE — Anesthesia Procedure Notes (Signed)
Anesthesia Regional Block: Pectoralis block   Pre-Anesthetic Checklist: ,, timeout performed, Correct Patient, Correct Site, Correct Laterality, Correct Procedure,, site marked, risks and benefits discussed, Surgical consent,  Pre-op evaluation,  At surgeon's request and post-op pain management  Laterality: Right  Prep: chloraprep       Needles:  Injection technique: Single-shot  Needle Type: Echogenic Stimulator Needle     Needle Length: 9cm  Needle Gauge: 21     Additional Needles:   Procedures:,,,, ultrasound used (permanent image in chart),,,,  Narrative:  Start time: 07/30/2017 12:40 PM End time: 07/30/2017 12:50 PM Injection made incrementally with aspirations every 5 mL.  Performed by: Personally  Anesthesiologist: Murvin Natal, MD  Additional Notes: Functioning IV was confirmed and monitors were applied.  A 55mm 21ga Arrow echogenic stimulator needle was used. Sterile prep, hand hygiene and sterile gloves were used. Negative aspiration and negative test dose prior to incremental administration of local anesthetic. The patient tolerated the procedure well.

## 2017-07-30 NOTE — Discharge Instructions (Addendum)
[place] Office Phone Number [phone]  BREAST BIOPSY/ LUMPECTOMY: POST OP INSTRUCTIONS  Always review your discharge instruction sheet given to you by the facility where your surgery was performed.  IF YOU HAVE DISABILITY OR FAMILY LEAVE FORMS, YOU MUST BRING THEM TO THE OFFICE FOR PROCESSING.  DO NOT GIVE THEM TO YOUR DOCTOR.  1. A prescription for pain medication may be given to you upon discharge.  Take your pain medication as prescribed, if needed.  If narcotic pain medicine is not needed, then you may take acetaminophen (Tylenol) or ibuprofen (Advil) as needed. ** You had 1000 MG of Tylenol at 12:12PM 2. Take your usually prescribed medications unless otherwise directed 3. If you need a refill on your pain medication, please contact your pharmacy.  They will contact our office to request authorization.  Prescriptions will not be filled after 5pm or on week-ends. 4. You should eat very light the first 24 hours after surgery, such as soup, crackers, pudding, etc.  Resume your normal diet the day after surgery. 5. Most patients will experience some swelling and bruising in the breast.  Ice packs and a good support bra will help.  Swelling and bruising can take several days to resolve.  6. It is common to experience some constipation if taking pain medication after surgery.  Increasing fluid intake and taking a stool softener will usually help or prevent this problem from occurring.  A mild laxative (Milk of Magnesia or Miralax) should be taken according to package directions if there are no bowel movements after 48 hours. 7. Unless discharge instructions indicate otherwise, you may remove your bandages 48 hours after surgery, and you may shower at that time.  You may have steri-strips (small skin tapes) in place directly over the incision.  These strips should be left on the skin for 7-10 days.   Any sutures or staples will be removed at the office during your follow-up  visit. 8. ACTIVITIES:  You may resume regular daily activities (gradually increasing) beginning the next day.  Wearing a good support bra or sports bra (or the breast binder) minimizes pain and swelling.  You may have sexual intercourse when it is comfortable. a. You may drive when you no longer are taking prescription pain medication, you can comfortably wear a seatbelt, and you can safely maneuver your car and apply brakes. b. RETURN TO WORK:  __________1 week_______________ 9. You should see your doctor in the office for a follow-up appointment approximately two weeks after your surgery.  Your doctors nurse will typically make your follow-up appointment when she calls you with your pathology report.  Expect your pathology report 2-3 business days after your surgery.  You may call to check if you do not hear from [place] after three days.   WHEN TO CALL YOUR DOCTOR: 1. Fever over 101.0 2. Nausea and/or vomiting. 3. Extreme swelling or bruising. 4. Continued bleeding from incision. 5. Increased pain, redness, or drainage from the incision.  The clinic staff is available to answer your questions during regular business hours.  Please dont hesitate to call and ask to speak to one of the nurses for clinical concerns.  If you have a medical emergency, go to the nearest emergency room or call 911.  A surgeon from [facility] is always on call at the hospital.  For further questions, please visit [email or web address]    Post Anesthesia Home Care Instructions  Activity: Get plenty of rest for the remainder of the day. A  responsible individual must stay with you for 24 hours following the procedure.  For the next 24 hours, DO NOT: -Drive a car -Paediatric nurse -Drink alcoholic beverages -Take any medication unless instructed by your physician -Make any legal decisions or sign important papers.  Meals: Start with liquid foods such as gelatin or soup. Progress to regular foods  as tolerated. Avoid greasy, spicy, heavy foods. If nausea and/or vomiting occur, drink only clear liquids until the nausea and/or vomiting subsides. Call your physician if vomiting continues.  Special Instructions/Symptoms: Your throat may feel dry or sore from the anesthesia or the breathing tube placed in your throat during surgery. If this causes discomfort, gargle with warm salt water. The discomfort should disappear within 24 hours.  If you had a scopolamine patch placed behind your ear for the management of post- operative nausea and/or vomiting:  1. The medication in the patch is effective for 72 hours, after which it should be removed.  Wrap patch in a tissue and discard in the trash. Wash hands thoroughly with soap and water. 2. You may remove the patch earlier than 72 hours if you experience unpleasant side effects which may include dry mouth, dizziness or visual disturbances. 3. Avoid touching the patch. Wash your hands with soap and water after contact with the patch.

## 2017-07-30 NOTE — Anesthesia Postprocedure Evaluation (Signed)
Anesthesia Post Note  Patient: FUSAE FLORIO  Procedure(s) Performed: BREAST LUMPECTOMY WITH RADIOACTIVE SEED AND SENTINEL LYMPH NODE BIOPSY (Right Breast)     Patient location during evaluation: PACU Anesthesia Type: Regional Level of consciousness: awake and alert Pain management: pain level controlled Vital Signs Assessment: post-procedure vital signs reviewed and stable Respiratory status: spontaneous breathing, nonlabored ventilation, respiratory function stable and patient connected to nasal cannula oxygen Cardiovascular status: blood pressure returned to baseline and stable Postop Assessment: no apparent nausea or vomiting Anesthetic complications: no    Last Vitals:  Vitals:   07/30/17 1645 07/30/17 1702  BP: 136/79 (!) 141/72  Pulse: 89 92  Resp: 19 18  Temp:  36.6 C  SpO2: 97% 100%    Last Pain:  Vitals:   07/30/17 1702  TempSrc:   PainSc: 0-No pain                 Jackelyn Illingworth DAVID

## 2017-07-30 NOTE — Transfer of Care (Signed)
Immediate Anesthesia Transfer of Care Note  Patient: [name]  Procedure(s) Performed: BREAST LUMPECTOMY WITH RADIOACTIVE SEED AND SENTINEL LYMPH NODE BIOPSY (Right Breast)  Patient Location: PACU  Anesthesia Type:General  Level of Consciousness: drowsy and patient cooperative  Airway & Oxygen Therapy: Patient Spontanous Breathing and Patient connected to face mask oxygen  Post-op Assessment: Report given to RN and Post -op Vital signs reviewed and stable  Post vital signs: Reviewed and stable  Last Vitals:  Vitals Value Taken Time  BP 128/72 07/30/2017  3:50 PM  Temp    Pulse 88 07/30/2017  3:52 PM  Resp 16 07/30/2017  3:52 PM  SpO2 99 % 07/30/2017  3:52 PM  Vitals shown include unvalidated device data.  Last Pain:  Vitals:   07/30/17 1207  TempSrc: Oral  PainSc: 0-No pain         Complications: No apparent anesthesia complications

## 2017-07-30 NOTE — Progress Notes (Signed)
Assisted Dr. Ellender with right, ultrasound guided, pectoralis block. Side rails up, monitors on throughout procedure. See vital signs in flow sheet. Tolerated Procedure well. °

## 2017-07-30 NOTE — Op Note (Signed)
Right Breast Radioactive seed localized lumpectomy and sentinel lymph node biopsy  Indications: This patient presents with history of right breast cancer, cT1bN0, LIQ, +/+/-  Pre-operative Diagnosis: right breast cancer  Post-operative Diagnosis: Right breast cancer  Surgeon: [name]   Anesthesia: General endotracheal anesthesia  ASA Class: 2  Procedure Details  The patient was seen in the Holding Room. The risks, benefits, complications, treatment options, and expected outcomes were discussed with the patient. The possibilities of bleeding, infection, the need for additional procedures, failure to diagnose a condition, and creating a complication requiring transfusion or operation were discussed with the patient. The patient concurred with the proposed plan, giving informed consent.  The site of surgery properly noted/marked. The patient was taken to Operating Room # 8, identified, and the procedure verified as Right Breast Seed Localized Lumpectomy. A Time Out was held and the above information confirmed.  The right arm, breast, and chest were prepped and draped in standard fashion. The lumpectomy was performed by creating a transverse incision near the previously placed radioactive seed.  Dissection was carried down to around the point of maximum signal intensity. The cautery was used to perform the dissection.  Hemostasis was achieved with cautery. Specimen radiography confirmed inclusion of the mammographic lesion, the clip, and the seed. The seed was very close to the lateral aspect of the specimen, so an additional lateral margin was taken.  The edges of the cavity were marked with large clips, with one each medial, lateral, inferior and superior, and two clips posteriorly.   The specimen was inked with the margin marker paint kit.     The background signal in the breast was zero.  The wound was irrigated and closed with 3-0 vicryl in layers and 4-0 monocryl subcuticular suture.     Using a hand-held gamma probe, right axillary sentinel nodes were identified transcutaneously.  An oblique incision was created below the axillary hairline.  Dissection was carried through the clavipectoral fascia.  Four deep level 2 axillary sentinel nodes were removed.  Counts per second were 390, 220, 100, and 25.    The background count was 8 cps.  The wound was irrigated.  Hemostasis was achieved with cautery.  The axillary incision was closed with a 3-0 vicryl deep dermal interrupted sutures and a 4-0 monocryl subcuticular closure.    Sterile dressings were applied. At the end of the operation, all sponge, instrument, and needle counts were correct.  Findings: grossly clear surgical margins and no adenopathy, anterior margin is skin, posterior margin is pectoralis, medial margin is presternal fatty tissue.    Estimated Blood Loss:  min         Specimens: Right breast lumpectomy with seed, additional lateral margin, and four right axillary sentinel lymph nodes.             Complications:  None; patient tolerated the procedure well.         Disposition: PACU - hemodynamically stable.         Condition: stable

## 2017-07-30 NOTE — Anesthesia Procedure Notes (Signed)
Procedure Name: LMA Insertion Date/Time: 07/30/2017 2:29 PM Performed by: Marrianne Mood, CRNA Pre-anesthesia Checklist: Patient identified, Emergency Drugs available, Suction available, Patient being monitored and Timeout performed Patient Re-evaluated:Patient Re-evaluated prior to induction Oxygen Delivery Method: Circle system utilized Preoxygenation: Pre-oxygenation with 100% oxygen Induction Type: IV induction Ventilation: Mask ventilation without difficulty LMA: LMA inserted LMA Size: 4.0 Number of attempts: 1 Airway Equipment and Method: Bite block Placement Confirmation: positive ETCO2 Tube secured with: Tape Dental Injury: Teeth and Oropharynx as per pre-operative assessment

## 2017-07-30 NOTE — Progress Notes (Signed)
[facility] Spiritual Care Note  Mailed handwritten note of encouragement with reminder of ongoing availability for support.   [place], [place], [facility] Pager [phone] Voicemail [phone]

## 2017-07-30 NOTE — Progress Notes (Signed)
Nuc med inj performed by nuc med staff. Pt tol well with additional fentanyl for comfort. VSS, see flowsheet. Will call family to bedside and update/provide emotional support.

## 2017-07-30 NOTE — Anesthesia Preprocedure Evaluation (Addendum)
Anesthesia Evaluation  Patient identified by MRN, date of birth, ID band Patient awake    Reviewed: Allergy & Precautions, NPO status , Patient's Chart, lab work & pertinent test results  History of Anesthesia Complications (+) PONV and history of anesthetic complications  Airway Mallampati: II  TM Distance: >3 FB Neck ROM: Full    Dental no notable dental hx.    Pulmonary former smoker,    Pulmonary exam normal breath sounds clear to auscultation       Cardiovascular hypertension, Pt. on medications Normal cardiovascular exam Rhythm:Regular Rate:Normal  ECG: NSR, rate 69   Neuro/Psych Syncope negative neurological ROS  negative psych ROS   GI/Hepatic Neg liver ROS, GERD  Controlled,  Endo/Other  negative endocrine ROS  Renal/GU negative Renal ROS     Musculoskeletal negative musculoskeletal ROS (+)   Abdominal (+) + obese,   Peds  Hematology HLD   Anesthesia Other Findings right breast cancer  Reproductive/Obstetrics                            Anesthesia Physical Anesthesia Plan  ASA: II  Anesthesia Plan: General and Regional   Post-op Pain Management: GA combined w/ Regional for post-op pain   Induction: Intravenous  PONV Risk Score and Plan: 3 and Ondansetron, Dexamethasone and Treatment may vary due to age or medical condition  Airway Management Planned: LMA  Additional Equipment:   Intra-op Plan:   Post-operative Plan: Extubation in OR  Informed Consent: I have reviewed the patients History and Physical, chart, labs and discussed the procedure including the risks, benefits and alternatives for the proposed anesthesia with the patient or authorized representative who has indicated his/her understanding and acceptance.   Dental advisory given  Plan Discussed with: CRNA  Anesthesia Plan Comments:         Anesthesia Quick Evaluation

## 2017-07-31 ENCOUNTER — Telehealth: Payer: Self-pay | Admitting: *Deleted

## 2017-07-31 ENCOUNTER — Encounter (HOSPITAL_BASED_OUTPATIENT_CLINIC_OR_DEPARTMENT_OTHER): Payer: Self-pay | Admitting: General Surgery

## 2017-07-31 NOTE — Telephone Encounter (Signed)
Spoke to pt concerning [place] from 5.8.19. Denies questions or concerns regarding dx or treatment care plan. Encourage pt to call with needs. Received verbal understanding.

## 2017-08-01 NOTE — Progress Notes (Signed)
Please let patient know tumor was small (6 mm). Margins and lymph nodes are negative!

## 2017-08-04 ENCOUNTER — Telehealth: Payer: Self-pay | Admitting: *Deleted

## 2017-08-04 NOTE — Telephone Encounter (Signed)
Received order for oncotype testing. Requisition faxed to pathology. Received by Keisha 

## 2017-08-18 ENCOUNTER — Ambulatory Visit: Payer: Medicare Other | Attending: General Surgery | Admitting: Physical Therapy

## 2017-08-18 ENCOUNTER — Encounter: Payer: Self-pay | Admitting: Physical Therapy

## 2017-08-18 ENCOUNTER — Other Ambulatory Visit: Payer: Self-pay

## 2017-08-18 DIAGNOSIS — Z483 Aftercare following surgery for neoplasm: Secondary | ICD-10-CM

## 2017-08-18 DIAGNOSIS — R293 Abnormal posture: Secondary | ICD-10-CM

## 2017-08-18 DIAGNOSIS — C50211 Malignant neoplasm of upper-inner quadrant of right female breast: Secondary | ICD-10-CM | POA: Diagnosis not present

## 2017-08-18 DIAGNOSIS — Z170 Estrogen receptor positive status [ER+]: Secondary | ICD-10-CM

## 2017-08-18 NOTE — Therapy (Signed)
[place], [place], [address] Phone: [phone]   Fax:  [phone]  Physical Therapy Treatment  Patient Details  Name: [name] MRN: [id number] Date of Birth: [birth year] Referring Provider: Dr. [name]   Encounter Date: 08/18/2017  PT End of Session - 08/18/17 1032    Visit Number  2    Number of Visits  2    PT Start Time  1000    PT Stop Time  1035    PT Time Calculation (min)  35 min    Activity Tolerance  Patient tolerated treatment well    Behavior During Therapy  [facility] for tasks assessed/performed       Past Medical History:  Diagnosis Date  . Arthritis   . Cancer ([place]) 06/2017   right breast cancer  . GERD (gastroesophageal reflux disease)    mild- TUMS used sparingly  . Hyperlipidemia   . Hypertension   . PONV (postoperative nausea and vomiting)    also "passed out with cyst removal from neck"superficial"  . Syncope    x2 episodes-evaluated and findings normal- "usually related to seeing blood or thought of it"  . Vaso vagal episode    easily    Past Surgical History:  Procedure Laterality Date  . ABDOMINAL HYSTERECTOMY    . BREAST LUMPECTOMY WITH RADIOACTIVE SEED AND SENTINEL LYMPH NODE BIOPSY Right 07/30/2017   Procedure: BREAST LUMPECTOMY WITH RADIOACTIVE SEED AND SENTINEL LYMPH NODE BIOPSY;  Surgeon: [name], MD;  Location: [facility];  Service: General;  Laterality: Right;  . BREAST SURGERY  08/27/11   right lumpectomy w/needle localization  . BUNIONECTOMY Bilateral 2000   bilateral -hardware remains  . CYST REMOVAL NECK     sebaceous  cyst from neck  . DILATION AND CURETTAGE OF UTERUS    . INTRAUTERINE DEVICE INSERTION    . IUD REMOVAL    . LAPAROSCOPIC APPENDECTOMY N/A 03/27/2017   Procedure: APPENDECTOMY LAPAROSCOPIC;  Surgeon: Alphonsa Overall, MD;  Location: [facility];  Service: General;  Laterality: N/A;  . Removal Right Straphenous vein  2011   . TONSILLECTOMY     child  . TOTAL HIP ARTHROPLASTY Right 11/29/2015   Procedure: RIGHT TOTAL HIP ARTHROPLASTY ANTERIOR APPROACH;  Surgeon: Gaynelle Arabian, MD;  Location: [facility];  Service: Orthopedics;  Laterality: Right;    There were no vitals filed for this visit.  Subjective Assessment - 08/18/17 0955    Subjective  Patient underwent a right lumpectomy and sentinel node biopsy (0/4 nodes positive) on 07/30/17. She had an Oncotype test but does not have those results yet. She will undergo radiation. This is not yet scheduled. She has had redness around her right breast and began an antibiotic 08/12/17 and will see her surgeon this afternoon.    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 She has no other medical problems.    Patient Stated Goals  Make sure my arm is ok    Currently in Pain?  No/denies         [facility] PT Assessment - 08/18/17 0001      Assessment   Medical Diagnosis  s/p right lumpectomy and SLNB    Referring Provider  Dr. [name]    Onset Date/Surgical Date  07/30/17    Hand Dominance  Right    Prior Therapy  Baselines      Precautions   Precautions  Other (comment)    Precaution Comments  right arm lymphedema risk      Restrictions   Weight Bearing Restrictions  No      Balance Screen   Has the patient fallen in the past 6 months  No    Has the patient had a decrease in activity level because of a fear of falling?   No    Is the patient reluctant to leave their home because of a fear of falling?   No      Home Film/video editor residence    Living Arrangements  Spouse/significant other    Available Help at Discharge  Family      Prior Function   Level of [place]  Retired    Leisure  She is starting to return to walking      Cognition   Overall  Cognitive Status  Within Functional Limits for tasks assessed      Observation/Other Assessments   Observations  Significant redness around incision sites and also redness present on medial upper arm which is in contact with incision.      Posture/Postural Control   Posture/Postural Control  Postural limitations    Postural Limitations  Rounded Shoulders;Forward head;Increased thoracic kyphosis      ROM / Strength   AROM / PROM / Strength  AROM      AROM   AROM Assessment Site  Shoulder    Right/Left Shoulder  Right    Right Shoulder Extension  49 Degrees    Right Shoulder Flexion  153 Degrees    Right Shoulder ABduction  153 Degrees    Right Shoulder Internal Rotation  68 Degrees    Right Shoulder External Rotation  64 Degrees      Strength   Overall Strength  Within functional limits for tasks performed        LYMPHEDEMA/ONCOLOGY QUESTIONNAIRE - 08/18/17 1007      Type   Cancer Type  Right breast cancer      Surgeries   Lumpectomy Date  07/30/17    Sentinel Lymph Node Biopsy Date  07/30/17    Number Lymph Nodes Removed  4      Treatment   Active Chemotherapy Treatment  No    Past Chemotherapy Treatment  No    Active Radiation Treatment  No    Past Radiation Treatment  No    Current Hormone Treatment  No    Past Hormone Therapy  No      What other symptoms do you have   Are you Having Heaviness or Tightness  No    Are you having Pain  No    Are you having pitting edema  No    Is it Hard or Difficult finding clothes that fit  No    Do you have infections  Yes    Comments  Diagnosed with possible infection 08/12/17 but is on antibiotic    Is there Decreased scar mobility  No    [place] Sign  No      Lymphedema Assessments   Lymphedema Assessments  Upper extremities      Right Upper Extremity Lymphedema   10 cm Proximal to Olecranon Process  30.9 cm    Olecranon Process  25.3 cm    10 cm Proximal to Ulnar Styloid Process  22.3 cm    Just Proximal to  Ulnar  Styloid Process  16.7 cm    Across Hand at [employer]  19.6 cm    At [place] of 2nd Digit  6.6 cm      Left Upper Extremity Lymphedema   10 cm Proximal to Olecranon Process  31.1 cm    Olecranon Process  25.7 cm    10 cm Proximal to Ulnar Styloid Process  21 cm    Just Proximal to Ulnar Styloid Process  16.4 cm    Across Hand at [employer]  19 cm    At [place] of 2nd Digit  6.3 cm        Quick Dash - 08/18/17 0001    Open a tight or new jar  No difficulty    Do heavy household chores (wash walls, wash floors)  Mild difficulty    Carry a shopping bag or briefcase  No difficulty    Wash your back  No difficulty    Use a knife to cut food  No difficulty    Recreational activities in which you take some force or impact through your arm, shoulder, or hand (golf, hammering, tennis)  Unable    During the past week, to what extent has your arm, shoulder or hand problem interfered with your normal social activities with family, friends, neighbors, or groups?  Slightly    During the past week, to what extent has your arm, shoulder or hand problem limited your work or other regular daily activities  Slightly    Arm, shoulder, or hand pain.  Mild    Tingling (pins and needles) in your arm, shoulder, or hand  Mild    Difficulty Sleeping  No difficulty    DASH Score  20.45 %                               PT Long Term Goals - 08/18/17 1039      PT LONG TERM GOAL #1   Title  Patient will demonstrate she has returned to baseline related to shoulder ROM and function post operatively.    Time  8    Period  Weeks    Status  Achieved      Breast Clinic Goals - 07/23/17 2107      Patient will be able to verbalize understanding of pertinent lymphedema risk reduction practices relevant to her diagnosis specifically related to skin care.   Time  1    Period  Days    Status  Achieved      Patient will be able to return demonstrate and/or verbalize understanding of the  post-op home exercise program related to regaining shoulder range of motion.   Time  1    Period  Days    Status  Achieved      Patient will be able to verbalize understanding of the importance of attending the postoperative After Breast Cancer Class for further lymphedema risk reduction education and therapeutic exercise.   Time  1    Period  Days    Status  Achieved           Plan - 08/18/17 1037    Clinical Impression Statement  Patient has regained full ROM and shoulder function s/p right lumpectomy and SLNB on 07/30/17. She has significant redness and "angry" skin around both her breast and axillary incision sites but will see her surgeon today. She has no need for PT at this  time but plans to attend the ABC class to be educated on lymphedema risk reduction practices.    PT Treatment/Interventions  ADLs/Self Care Home Management;Therapeutic exercise;Patient/family education    PT Next Visit Plan  D/C    PT Home Exercise Plan  Post op shoulder ROM HEP    Consulted and Agree with Plan of Care  Patient       Patient will benefit from skilled therapeutic intervention in order to improve the following deficits and impairments:  Pain, Postural dysfunction, Impaired UE functional use, Decreased knowledge of precautions, Decreased range of motion  Visit Diagnosis: Malignant neoplasm of upper-inner quadrant of right breast in female, estrogen receptor positive (HCC)  Abnormal posture  Aftercare following surgery for neoplasm     Problem List Patient Active Problem List   Diagnosis Date Noted  . Malignant neoplasm of lower-inner quadrant of right breast of female, estrogen receptor positive ([place]) 07/22/2017  . Appendicitis with perforation 03/27/2017  . OA (osteoarthritis) of hip 11/29/2015    PHYSICAL THERAPY DISCHARGE SUMMARY  Visits from Start of Care: 1  Current functional level related to goals / functional outcomes: Goals met. See above.   Remaining deficits: None  related to function. Skin and incision sites will be assessed by surgeon today.   Education / Equipment: HEP and lymphedema risk reduction practices.  Plan: Patient agrees to discharge.  Patient goals were met. Patient is being discharged due to meeting the stated rehab goals.  ?????    Annia Friendly, [place] 08/18/17 10:41 AM  [place], [place], [address] Phone: [phone]   Fax:  [phone]  Name: [name] MRN: [id number] Date of Birth: October 21, 1945

## 2017-08-20 ENCOUNTER — Telehealth: Payer: Self-pay | Admitting: *Deleted

## 2017-08-20 ENCOUNTER — Encounter: Payer: Self-pay | Admitting: Radiation Oncology

## 2017-08-20 DIAGNOSIS — C50311 Malignant neoplasm of lower-inner quadrant of right female breast: Secondary | ICD-10-CM

## 2017-08-20 DIAGNOSIS — Z170 Estrogen receptor positive status [ER+]: Principal | ICD-10-CM

## 2017-08-20 NOTE — Telephone Encounter (Signed)
Received oncotype score of 20/6%. Physician team notified. Left vm for pt to return call to discuss results. Contact information provided.

## 2017-08-21 ENCOUNTER — Telehealth: Payer: Self-pay | Admitting: *Deleted

## 2017-08-21 NOTE — Telephone Encounter (Signed)
Spoke to pt regarding oncotype score of 20. Discussed she does not need chemotherapy and next step is xrt with Dr. Lisbeth Renshaw. Confirmed appt with Dr. Lisbeth Renshaw on 6/12. Pt in question if she still needs xrt. Informed pt that oncotype only determines risk of recurrence and benefit to chemo and that she will still need xrt. Denies further questions or needs.

## 2017-08-25 NOTE — Progress Notes (Signed)
Location of Breast Cancer: Lower inner quadrant Right breast   Did patient present with symptoms (if so, please note symptoms) or was this found on screening mammography?: Patient had abnormal mammogram and was referred by her PCP.  She has had a previous abnormal mammogram with a benign papilloma that was removed.  Diagnostic mammogram 07/15/2017: New irregular lesion in the right breast is indeterminate.  An ultrasound is recommended.  Breast Ultrasound 07/15/2017: 6 mm mass in the right breast suspicious for malignancy.  Histology per Pathology Report:   Diagnosis 07-30-17   Dr. [name] 1. Breast, lumpectomy, right - INVASIVE DUCTAL CARCINOMA, GRADE 2, SPANNING 0.6 CM. - INTERMEDIATE GRADE DUCTAL CARCINOMA IN SITU. - FINAL RESECTION MARGINS ARE NEGATIVE. - BIOPSY SITE. - SEE ONCOLOGY TABLE. 2. Breast, excision, right additional lateral margin - FIBROCYSTIC CHANGE. - NO MALIGNANCY IDENTIFIED. 3. Lymph node, sentinel, biopsy, right axillary #1 - ONE OF ONE LYMPH NODES NEGATIVE FOR CARCINOMA (0/1). 4. Lymph node, sentinel, biopsy, right axillary #2 - ONE OF ONE LYMPH NODES NEGATIVE FOR CARCINOMA (0/1). 5. Lymph node, sentinel, biopsy, right axillary #3 - ONE OF ONE LYMPH NODES NEGATIVE FOR CARCINOMA (0/1). 6. Lymph node, sentinel, biopsy, right axillary #4 - ONE OF ONE LYMPH NODES NEGATIVE FOR CARCINOMA (0/1).  Receptor Status: ER(+ 100%), PR (+ 80%), Her2-neu (-), Ki-67(10%)  Past/Anticipated interventions by surgeon, if any:   Diagnosis 07-30-17   Dr. [name] 1. Breast, lumpectomy, right - INVASIVE DUCTAL CARCINOMA, GRADE 2, SPANNING 0.6 CM. - INTERMEDIATE GRADE DUCTAL CARCINOMA IN SITU. - FINAL RESECTION MARGINS ARE NEGATIVE. - BIOPSY SITE. - SEE ONCOLOGY TABLE. 2. Breast, excision, right additional lateral margin - FIBROCYSTIC CHANGE. - NO MALIGNANCY IDENTIFIED. 3. Lymph node, sentinel, biopsy, right axillary #1 - ONE OF ONE LYMPH NODES NEGATIVE FOR CARCINOMA  (0/1). 4. Lymph node, sentinel, biopsy, right axillary #2 - ONE OF ONE LYMPH NODES NEGATIVE FOR CARCINOMA (0/1). 5. Lymph node, sentinel, biopsy, right axillary #3 - ONE OF ONE LYMPH NODES NEGATIVE FOR CARCINOMA (0/1). 6. Lymph node, sentinel, biopsy, right axillary #4 - ONE OF ONE LYMPH NODES NEGATIVE FOR CARCINOMA (0/1).  Receptor Status: ER(+ 100%), PR (+ 80%), Her2-neu (-), Ki-67(10%)   Past/Anticipated interventions by medical oncology, if any:  Dr. Burr Medico 07/23/2017 --She is a candidate for breast conservation surgery. She has been seen by breast surgeon Dr. Barry Dienes, who recommends lumpectomy with sentinel lymph node biopsy. -I recommend a Oncotype Dx test on the surgical sample if it is still Grade II and tumor >=69m, or tumor more than 10 mm with any grade. We'll make a decision about adjuvant chemotherapy based on the Oncotype result. She is young and fit, would be a good candidate for chemotherapy if her Oncotype recurrence score is high. -If her surgical sentinel lymph node positive, I recommend mammaprint for further risk stratification and guide adjuvant chemotherapy. -The risk of recurrence depends on the stage and biology of the tumor. She is stage 1A, with ER/PR positive and HER2 negative. I discussed this is the more common type of slow growing tumor.  -She was also seen by radiation oncologist Dr. MLisbeth Renshawtoday. She will need adjuvant Radiation Therapy to reduce her risk of local recurrence.   -Giving the strong ER and PR expression in her postmenopausal status, I recommend adjuvant endocrine therapy for a total of 5-10 years to reduce the risk of cancer recurrence. Potential benefits and side effects were discussed with patient and she is interested. Plan to start once she recovers  from radiation.   -Oncotype score 20, no chemotherapy.  Lymphedema issues, if any: No, saw physical therapist  Was fitted for a sleeve n session were needed. ROM to right arm good Skin to right breast  operative sites healing without signs of infection.  Follow up appointment with Dr. [name] 08-21-17 for contact dermatitis from the surgicial glue.  Pain issues, if any: No   SAFETY ISSUES:  Prior radiation? No  Pacemaker/ICD? No  Possible current pregnancy? No, hysterectomy  Is the patient on methotrexate? No  Current Complaints / other details:   Wt Readings from Last 3 Encounters:  08/27/17 108 lb 12.8 oz (49.4 kg)  07/30/17 181 lb (82.1 kg)  07/23/17 184 lb 6.4 oz (83.6 kg)  BP 138/77 (BP Location: Left Arm, Patient Position: Sitting, Cuff Size: Large)   Pulse 92   Temp 98.3 F (36.8 C) (Oral)   Resp 20   Ht '5\' 5"'  (1.651 m)   Wt 108 lb 12.8 oz (49.4 kg)   SpO2 99%   BMI 18.11 kg/m    Cori Razor, RN 08/25/2017,11:15 AM

## 2017-08-27 ENCOUNTER — Ambulatory Visit
Admission: RE | Admit: 2017-08-27 | Discharge: 2017-08-27 | Disposition: A | Discharge status: Home / Self Care | Payer: Medicare Other | Source: Ambulatory Visit | Attending: Radiation Oncology | Admitting: Radiation Oncology

## 2017-08-27 ENCOUNTER — Other Ambulatory Visit: Payer: Self-pay

## 2017-08-27 ENCOUNTER — Encounter: Payer: Self-pay | Admitting: Radiation Oncology

## 2017-08-27 VITALS — BP 138/77 | HR 92 | Temp 98.3°F | Resp 20 | Ht 65.0 in | Wt 108.8 lb

## 2017-08-27 DIAGNOSIS — C50311 Malignant neoplasm of lower-inner quadrant of right female breast: Secondary | ICD-10-CM | POA: Diagnosis present

## 2017-08-27 DIAGNOSIS — I10 Essential (primary) hypertension: Secondary | ICD-10-CM | POA: Insufficient documentation

## 2017-08-27 DIAGNOSIS — Z510 Encounter for antineoplastic radiation therapy: Secondary | ICD-10-CM | POA: Insufficient documentation

## 2017-08-27 DIAGNOSIS — E785 Hyperlipidemia, unspecified: Secondary | ICD-10-CM | POA: Diagnosis not present

## 2017-08-27 DIAGNOSIS — Z87891 Personal history of nicotine dependence: Secondary | ICD-10-CM | POA: Insufficient documentation

## 2017-08-27 DIAGNOSIS — Z170 Estrogen receptor positive status [ER+]: Secondary | ICD-10-CM | POA: Diagnosis present

## 2017-08-27 DIAGNOSIS — K219 Gastro-esophageal reflux disease without esophagitis: Secondary | ICD-10-CM | POA: Diagnosis not present

## 2017-08-27 DIAGNOSIS — D0511 Intraductal carcinoma in situ of right breast: Secondary | ICD-10-CM | POA: Diagnosis not present

## 2017-08-27 DIAGNOSIS — I890 Lymphedema, not elsewhere classified: Secondary | ICD-10-CM | POA: Diagnosis not present

## 2017-08-27 DIAGNOSIS — Z79899 Other long term (current) drug therapy: Secondary | ICD-10-CM | POA: Diagnosis not present

## 2017-08-27 NOTE — Progress Notes (Signed)
  Radiation Oncology         [phone]) [phone] ________________________________  Name: [name] MRN: [id number]  Date: 08/27/2017  DOB: 21-Jun-1945  Optical Surface Tracking Plan:  Since intensity modulated radiotherapy (IMRT) and 3D conformal radiation treatment methods are predicated on accurate and precise positioning for treatment, intrafraction motion monitoring is medically necessary to ensure accurate and safe treatment delivery.  The ability to quantify intrafraction motion without excessive ionizing radiation dose can only be performed with optical surface tracking. Accordingly, surface imaging offers the opportunity to obtain 3D measurements of patient position throughout IMRT and 3D treatments without excessive radiation exposure.  I am ordering optical surface tracking for this patient's upcoming course of radiotherapy. ________________________________  Kyung Rudd, MD 08/27/2017 6:32 PM    Reference:   Ursula Alert, J, et al. Surface imaging-based analysis of intrafraction motion for breast radiotherapy patients.Journal of [place], n. 6, nov. 2014. ISSN 69678938.   Available at: <http://www.jacmp.org/index.php/jacmp/article/view/4957>.

## 2017-08-27 NOTE — Progress Notes (Signed)
  Radiation Oncology         [phone] ________________________________  Name: SHALVA ROZYCKI MRN: [id number]  Date: 08/27/2017  DOB: Jan 18, 1946   DIAGNOSIS:     ICD-10-CM   1. Malignant neoplasm of lower-inner quadrant of right breast of female, estrogen receptor positive ([place]) C50.311    Z17.0     SIMULATION AND TREATMENT PLANNING NOTE  The patient presented for simulation prior to beginning her course of radiation treatment for her diagnosis of right-sided breast cancer. The patient was placed in a supine position on a breast board. A customized vac-lock bag was constructed and this complex treatment device will be used on a daily basis during her treatment. In this fashion, a CT scan was obtained through the chest area and an isocenter was placed near the chest wall within the breast.  The patient will be planned to receive a course of radiation initially to a dose of 42.56 Gy. This will consist of a whole breast radiotherapy technique. To accomplish this, 2 customized blocks have been designed which will correspond to medial and lateral whole breast tangent fields. This treatment will be accomplished at 2.66 Gy per fraction. A forward planning technique will also be evaluated to determine if this approach improves the plan. It is anticipated that the patient will then receive a 8 Gy boost to the seroma cavity which has been contoured. This will be accomplished at 2 Gy per fraction.   This initial treatment will consist of a 3-D conformal technique. The seroma has been contoured as the primary target structure. Additionally, dose volume histograms of both this target as well as the lungs and heart will also be evaluated. Such an approach is necessary to ensure that the target area is adequately covered while the nearby critical  normal structures are adequately spared.  Plan:  The final anticipated total dose therefore will correspond to 50.56  Gy.    _______________________________   Jodelle Gross, MD, PhD

## 2017-08-27 NOTE — Progress Notes (Signed)
Radiation Oncology         ([phone]) [phone] ________________________________  Name: [name]        MRN: [id number]  Date of Service: 08/27/2017 DOB: 1946/03/16  RX:[id number], Christina Sheffield, MD  [name], MD     REFERRING PHYSICIAN: [name], MD   DIAGNOSIS: The encounter diagnosis was Malignant neoplasm of lower-inner quadrant of right breast of female, estrogen receptor positive ([place]).   HISTORY OF PRESENT ILLNESS: [name] is a 72 y.o. female who was originally seen in the multidisciplinary breast clinic for a new diagnosis of right breast cancer. The patient was noted to have screening detected assymetry. She has had a prior excision in 2012 and 2013 in the right breast. She had diagnostic imaging following her most recent mammogram and at 3:00 there was a 6 mm mass. Her axilla was negative for adenopathy. She underwent a biopsy on 07/16/17 which revealed a grade 2, invasive ductal carcinoma with DCIS. Her tumor was ER/PR positive, HER2 negative, with a Ki 67 of 10%. She underwent right lumpectomy and sentinel node biopsy on 07/30/17 which revealed a grade 2, 6 mm invasive ductal carcinoma with DCIS.  Her margins were negative and her 4 sampled nodes were negative.  She did have an Oncotype DX score performed on the tumor which was noted to be 920.  She is not going to receive chemotherapy.  She comes today to discuss options of adjuvant radiotherapy.    PREVIOUS RADIATION THERAPY: No   PAST MEDICAL HISTORY:  Past Medical History:  Diagnosis Date  . Arthritis   . Cancer ([place]) 06/2017   right breast cancer  . GERD (gastroesophageal reflux disease)    mild- TUMS used sparingly  . Hyperlipidemia   . Hypertension   . PONV (postoperative nausea and vomiting)    also "passed out with cyst removal from neck"superficial"  . Syncope    x2 episodes-evaluated and findings normal- "usually related to seeing blood or thought of it"  . Vaso vagal episode    easily       PAST  SURGICAL HISTORY: Past Surgical History:  Procedure Laterality Date  . ABDOMINAL HYSTERECTOMY    . BREAST LUMPECTOMY WITH RADIOACTIVE SEED AND SENTINEL LYMPH NODE BIOPSY Right 07/30/2017   Procedure: BREAST LUMPECTOMY WITH RADIOACTIVE SEED AND SENTINEL LYMPH NODE BIOPSY;  Surgeon: [name], MD;  Location: [facility];  Service: General;  Laterality: Right;  . BREAST SURGERY  08/27/11   right lumpectomy w/needle localization  . BUNIONECTOMY Bilateral 2000   bilateral -hardware remains  . CYST REMOVAL NECK     sebaceous  cyst from neck  . DILATION AND CURETTAGE OF UTERUS    . INTRAUTERINE DEVICE INSERTION    . IUD REMOVAL    . LAPAROSCOPIC APPENDECTOMY N/A 03/27/2017   Procedure: APPENDECTOMY LAPAROSCOPIC;  Surgeon: Alphonsa Overall, MD;  Location: [facility];  Service: General;  Laterality: N/A;  . Removal Right Straphenous vein  2011  . TONSILLECTOMY     child  . TOTAL HIP ARTHROPLASTY Right 11/29/2015   Procedure: RIGHT TOTAL HIP ARTHROPLASTY ANTERIOR APPROACH;  Surgeon: Gaynelle Arabian, MD;  Location: [facility];  Service: Orthopedics;  Laterality: Right;     FAMILY HISTORY:  Family History  Problem Relation Age of Onset  . Cancer Mother        benign brain tumor   . Cancer Paternal Aunt        unknown type      SOCIAL HISTORY:  reports that she quit smoking about 31 years ago. She has a 5.00 pack-year smoking history. She has never used smokeless tobacco. She reports that she drinks alcohol. She reports that she does not use drugs. The patient is married and lives in [place]. She is a retired Pharmacist, hospital.  ALLERGIES: Dermacinrx surgical pharmapak   MEDICATIONS:  Current Outpatient Medications  Medication Sig Dispense Refill  . acetaminophen (TYLENOL) 500 MG tablet Take 2 tablets (1,000 mg total) by mouth every 8 (eight) hours. 100 tablet 2  . atorvastatin (LIPITOR) 80 MG tablet Take 80 mg by mouth every evening.    Marland Kitchen FINACEA 15 % cream Apply 1 application topically 2  (two) times daily. Applied to nose.  0  . MICARDIS HCT 40-12.5 MG per tablet Take 1 tablet by mouth daily. Taking Telmisartin 40-12.5 Hydrochlorothiazide po daily    . Multiple Vitamins-Minerals (WOMENS MULTIVITAMIN PO) Take 1 tablet by mouth daily.    . multivitamin-lutein (OCUVITE-LUTEIN) CAPS capsule Take 1 capsule by mouth daily.    . Omega-3 Fatty Acids (FISH OIL) 1200 MG CAPS Take 1,200 mg by mouth daily.    Marland Kitchen ZETIA 10 MG tablet Take 10 mg by mouth every evening.     . traMADol (ULTRAM) 50 MG tablet Take 1-2 tablets (50-100 mg total) by mouth every 6 (six) hours as needed for moderate pain (mild pain). (Patient not taking: Reported on 08/27/2017) 50 tablet 0   No current facility-administered medications for this encounter.      REVIEW OF SYSTEMS: On review of systems, the patient reports that she is doing well overall.  She reports that she has been healing quite well and denies any concerns with her skin.  She has been making plans to follow-up with physical therapy staff due to some mild edema in her hand, she has plans to travel tomorrow to [facility] by plane, and already has her compression sleeves.  She denies any chest pain, shortness of breath, cough, fevers, chills, night sweats, unintended weight changes. She denies any bowel or bladder disturbances, and denies abdominal pain, nausea or vomiting. She denies any new musculoskeletal or joint aches or pains. A complete review of systems is obtained and is otherwise negative.     PHYSICAL EXAM:  Wt Readings from Last 3 Encounters:  08/27/17 108 lb 12.8 oz (49.4 kg)  07/30/17 181 lb (82.1 kg)  07/23/17 184 lb 6.4 oz (83.6 kg)   Temp Readings from Last 3 Encounters:  08/27/17 98.3 F (36.8 C) (Oral)  07/30/17 97.8 F (36.6 C)  07/23/17 98.1 F (36.7 C) (Oral)   BP Readings from Last 3 Encounters:  08/27/17 138/77  07/30/17 (!) 141/72  07/23/17 (!) 141/89   Pulse Readings from Last 3 Encounters:  08/27/17 92  07/30/17 92    07/23/17 95     In general this is a well appearing caucasian female in no acute distress. She is alert and oriented x4 and appropriate throughout the examination. HEENT reveals that the patient is normocephalic, atraumatic. EOMs are intact. PERRLA. In general this is a well appearing caucasian female in no acute distress. She's alert and oriented x4 and appropriate throughout the examination. Cardiopulmonary assessment is negative for acute distress and she exhibits normal effort.  The right breast is examined and well-healing lumpectomy and axillary incisions are appreciated.  No evidence of cellulitic streaking is noted.  There is mild to trace edema of her right hand, without evidence of pitting.   ECOG = 0  0 -  Asymptomatic (Fully active, able to carry on all predisease activities without restriction)  1 - Symptomatic but completely ambulatory (Restricted in physically strenuous activity but ambulatory and able to carry out work of a light or sedentary nature. For example, light housework, office work)  2 - Symptomatic, <50% in bed during the day (Ambulatory and capable of all self care but unable to carry out any work activities. Up and about more than 50% of waking hours)  3 - Symptomatic, >50% in bed, but not bedbound (Capable of only limited self-care, confined to bed or chair 50% or more of waking hours)  4 - Bedbound (Completely disabled. Cannot carry on any self-care. Totally confined to bed or chair)  5 - Death   Eustace Pen MM, Creech RH, Tormey DC, et al. [phone]). "Toxicity and response criteria of the [facility]". [place] Oncol. 5 (6): 649-55    LABORATORY DATA:  Lab Results  Component Value Date   WBC 6.3 07/23/2017   HGB 14.5 07/23/2017   HCT 42.8 07/23/2017   MCV 94.2 07/23/2017   PLT 202 07/23/2017   Lab Results  Component Value Date   NA 141 07/23/2017   K 4.1 07/23/2017   CL 105 07/23/2017   CO2 29 07/23/2017   Lab Results   Component Value Date   ALT 21 07/23/2017   AST 20 07/23/2017   ALKPHOS 74 07/23/2017   BILITOT 0.7 07/23/2017      RADIOGRAPHY: Nm Sentinel Node Inj-no Rpt (breast)  Result Date: 07/30/2017 Sulfur colloid was injected by the nuclear medicine technologist for melanoma sentinel node.       IMPRESSION/PLAN: 1. 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 Dr. Lisbeth Renshaw discusses the final pathology findings and reviews the nature of invasive breast disease.  She is healing well and is ready to proceed with adjuvant radiotherapy.  She will follow this with antiestrogen medication under the care of Dr. Lindi Adie.  We discussed the risks, benefits, short, and long term effects of radiotherapy, and the patient is interested in proceeding. Dr. Lisbeth Renshaw discusses the delivery and logistics of radiotherapy and anticipates a course of 4 weeks of radiotherapy. Written consent is obtained and placed in the chart, a copy was provided to the patient. She is interested in simulating today.  After discussing with our staff this is an option, and she anticipates starting treatment next Monday. 2. Early lymphedema.  The patient is going to continue to follow-up with the physical therapist at their lymphedema clinic for further management.  We will follow this expectantly.  In a visit lasting 25 minutes, greater than 50% of the time was spent face to face discussing her case, and coordinating the patient's care.   The above documentation reflects my direct findings during this shared patient visit. Please see the separate note by Dr. Lisbeth Renshaw on this date for the remainder of the patient's plan of care.    Carola Rhine, PAC

## 2017-08-28 ENCOUNTER — Telehealth: Payer: Self-pay | Admitting: Hematology

## 2017-08-28 DIAGNOSIS — Z510 Encounter for antineoplastic radiation therapy: Secondary | ICD-10-CM | POA: Diagnosis not present

## 2017-08-28 NOTE — Telephone Encounter (Signed)
Appointment scheduled letter/calendar mailed to patient per 6/12 sch msg

## 2017-09-01 ENCOUNTER — Ambulatory Visit
Admission: RE | Admit: 2017-09-01 | Discharge: 2017-09-01 | Disposition: A | Discharge status: Home / Self Care | Payer: Medicare Other | Source: Ambulatory Visit | Attending: Radiation Oncology | Admitting: Radiation Oncology

## 2017-09-01 DIAGNOSIS — Z510 Encounter for antineoplastic radiation therapy: Secondary | ICD-10-CM | POA: Diagnosis not present

## 2017-09-01 DIAGNOSIS — Z170 Estrogen receptor positive status [ER+]: Principal | ICD-10-CM

## 2017-09-01 DIAGNOSIS — C50311 Malignant neoplasm of lower-inner quadrant of right female breast: Secondary | ICD-10-CM

## 2017-09-01 MED ORDER — ALRA NON-METALLIC DEODORANT (RAD-ONC)
1.0000 "application " | Freq: Once | TOPICAL | Status: AC
  Administered 2017-09-01: 1 via TOPICAL

## 2017-09-01 NOTE — Progress Notes (Signed)
Pt here for patient teaching.  Pt given Radiation and You booklet, skin care instructions, Alra deodorant and Radiaplex gel.  Reviewed areas of pertinence such as fatigue, hair loss, skin changes, breast tenderness and breast swelling . Pt able to give teach back of to pat skin and use unscented/gentle soap,apply Radiaplex bid, avoid applying anything to skin within 4 hours of treatment, avoid wearing an under wire bra and to use an electric razor if they must shave. Pt verbalizes understanding of information given and will contact nursing with any questions or concerns.     Wania Longstreth M. Rey Dansby RN, BSN      

## 2017-09-02 ENCOUNTER — Ambulatory Visit
Admission: RE | Admit: 2017-09-02 | Discharge: 2017-09-02 | Disposition: A | Discharge status: Home / Self Care | Payer: Medicare Other | Source: Ambulatory Visit | Attending: Radiation Oncology | Admitting: Radiation Oncology

## 2017-09-02 DIAGNOSIS — Z510 Encounter for antineoplastic radiation therapy: Secondary | ICD-10-CM | POA: Diagnosis not present

## 2017-09-02 NOTE — Progress Notes (Signed)
Pt here for patient teaching.  Pt given Radiation and You booklet and Alra deodorant, and advised to use 100% pure aloe vera gel due to allergy to radiaplex and sonifine ingredients.  Reviewed areas of pertinence such as fatigue, hair loss, skin changes, breast tenderness and breast swelling . Pt able to give teach back of to pat skin and use unscented/gentle soap,avoid applying anything to skin within 4 hours of treatment and to use an electric razor if they must shave. Pt verbalizes understanding of information given and will contact nursing with any questions or concerns.     Gloriajean Dell. Leonie Green, BSN

## 2017-09-03 ENCOUNTER — Ambulatory Visit
Admission: RE | Admit: 2017-09-03 | Discharge: 2017-09-03 | Disposition: A | Discharge status: Home / Self Care | Payer: Medicare Other | Source: Ambulatory Visit | Attending: Radiation Oncology | Admitting: Radiation Oncology

## 2017-09-03 DIAGNOSIS — Z510 Encounter for antineoplastic radiation therapy: Secondary | ICD-10-CM | POA: Diagnosis not present

## 2017-09-04 ENCOUNTER — Ambulatory Visit
Admission: RE | Admit: 2017-09-04 | Discharge: 2017-09-04 | Disposition: A | Discharge status: Home / Self Care | Payer: Medicare Other | Source: Ambulatory Visit | Attending: Radiation Oncology | Admitting: Radiation Oncology

## 2017-09-04 ENCOUNTER — Encounter: Payer: Self-pay | Admitting: Hematology

## 2017-09-04 DIAGNOSIS — Z510 Encounter for antineoplastic radiation therapy: Secondary | ICD-10-CM | POA: Diagnosis not present

## 2017-09-05 ENCOUNTER — Ambulatory Visit
Admission: RE | Admit: 2017-09-05 | Discharge: 2017-09-05 | Disposition: A | Discharge status: Home / Self Care | Payer: Medicare Other | Source: Ambulatory Visit | Attending: Radiation Oncology | Admitting: Radiation Oncology

## 2017-09-05 DIAGNOSIS — Z510 Encounter for antineoplastic radiation therapy: Secondary | ICD-10-CM | POA: Diagnosis not present

## 2017-09-08 ENCOUNTER — Ambulatory Visit
Admission: RE | Admit: 2017-09-08 | Discharge: 2017-09-08 | Disposition: A | Discharge status: Home / Self Care | Payer: Medicare Other | Source: Ambulatory Visit | Attending: Radiation Oncology | Admitting: Radiation Oncology

## 2017-09-08 DIAGNOSIS — Z510 Encounter for antineoplastic radiation therapy: Secondary | ICD-10-CM | POA: Diagnosis not present

## 2017-09-09 ENCOUNTER — Ambulatory Visit
Admission: RE | Admit: 2017-09-09 | Discharge: 2017-09-09 | Disposition: A | Discharge status: Home / Self Care | Payer: Medicare Other | Source: Ambulatory Visit | Attending: Radiation Oncology | Admitting: Radiation Oncology

## 2017-09-09 DIAGNOSIS — Z510 Encounter for antineoplastic radiation therapy: Secondary | ICD-10-CM | POA: Diagnosis not present

## 2017-09-10 ENCOUNTER — Ambulatory Visit
Admission: RE | Admit: 2017-09-10 | Discharge: 2017-09-10 | Disposition: A | Discharge status: Home / Self Care | Payer: Medicare Other | Source: Ambulatory Visit | Attending: Radiation Oncology | Admitting: Radiation Oncology

## 2017-09-10 DIAGNOSIS — Z510 Encounter for antineoplastic radiation therapy: Secondary | ICD-10-CM | POA: Diagnosis not present

## 2017-09-11 ENCOUNTER — Ambulatory Visit
Admission: RE | Admit: 2017-09-11 | Discharge: 2017-09-11 | Disposition: A | Discharge status: Home / Self Care | Payer: Medicare Other | Source: Ambulatory Visit | Attending: Radiation Oncology | Admitting: Radiation Oncology

## 2017-09-11 DIAGNOSIS — Z510 Encounter for antineoplastic radiation therapy: Secondary | ICD-10-CM | POA: Diagnosis not present

## 2017-09-12 ENCOUNTER — Ambulatory Visit
Admission: RE | Admit: 2017-09-12 | Discharge: 2017-09-12 | Disposition: A | Discharge status: Home / Self Care | Payer: Medicare Other | Source: Ambulatory Visit | Attending: Radiation Oncology | Admitting: Radiation Oncology

## 2017-09-12 DIAGNOSIS — Z510 Encounter for antineoplastic radiation therapy: Secondary | ICD-10-CM | POA: Diagnosis not present

## 2017-09-15 ENCOUNTER — Ambulatory Visit
Admission: RE | Admit: 2017-09-15 | Discharge: 2017-09-15 | Disposition: A | Discharge status: Home / Self Care | Payer: Medicare Other | Source: Ambulatory Visit | Attending: Radiation Oncology | Admitting: Radiation Oncology

## 2017-09-15 DIAGNOSIS — Z170 Estrogen receptor positive status [ER+]: Secondary | ICD-10-CM | POA: Diagnosis not present

## 2017-09-15 DIAGNOSIS — Z510 Encounter for antineoplastic radiation therapy: Secondary | ICD-10-CM | POA: Diagnosis present

## 2017-09-15 DIAGNOSIS — C50311 Malignant neoplasm of lower-inner quadrant of right female breast: Secondary | ICD-10-CM | POA: Insufficient documentation

## 2017-09-16 ENCOUNTER — Ambulatory Visit
Admission: RE | Admit: 2017-09-16 | Discharge: 2017-09-16 | Disposition: A | Discharge status: Home / Self Care | Payer: Medicare Other | Source: Ambulatory Visit | Attending: Radiation Oncology | Admitting: Radiation Oncology

## 2017-09-16 DIAGNOSIS — Z510 Encounter for antineoplastic radiation therapy: Secondary | ICD-10-CM | POA: Diagnosis not present

## 2017-09-17 ENCOUNTER — Ambulatory Visit
Admission: RE | Admit: 2017-09-17 | Discharge: 2017-09-17 | Disposition: A | Discharge status: Home / Self Care | Payer: Medicare Other | Source: Ambulatory Visit | Attending: Radiation Oncology | Admitting: Radiation Oncology

## 2017-09-17 DIAGNOSIS — Z510 Encounter for antineoplastic radiation therapy: Secondary | ICD-10-CM | POA: Diagnosis not present

## 2017-09-19 ENCOUNTER — Ambulatory Visit: Payer: Medicare Other | Admitting: Radiation Oncology

## 2017-09-19 ENCOUNTER — Ambulatory Visit
Admission: RE | Admit: 2017-09-19 | Discharge: 2017-09-19 | Disposition: A | Discharge status: Home / Self Care | Payer: Medicare Other | Source: Ambulatory Visit | Attending: Radiation Oncology | Admitting: Radiation Oncology

## 2017-09-19 DIAGNOSIS — Z510 Encounter for antineoplastic radiation therapy: Secondary | ICD-10-CM | POA: Diagnosis not present

## 2017-09-22 ENCOUNTER — Ambulatory Visit
Admission: RE | Admit: 2017-09-22 | Discharge: 2017-09-22 | Disposition: A | Discharge status: Home / Self Care | Payer: Medicare Other | Source: Ambulatory Visit | Attending: Radiation Oncology | Admitting: Radiation Oncology

## 2017-09-22 DIAGNOSIS — Z510 Encounter for antineoplastic radiation therapy: Secondary | ICD-10-CM | POA: Diagnosis not present

## 2017-09-23 ENCOUNTER — Ambulatory Visit
Admission: RE | Admit: 2017-09-23 | Discharge: 2017-09-23 | Disposition: A | Discharge status: Home / Self Care | Payer: Medicare Other | Source: Ambulatory Visit | Attending: Radiation Oncology | Admitting: Radiation Oncology

## 2017-09-23 DIAGNOSIS — Z510 Encounter for antineoplastic radiation therapy: Secondary | ICD-10-CM | POA: Diagnosis not present

## 2017-09-24 ENCOUNTER — Ambulatory Visit
Admission: RE | Admit: 2017-09-24 | Discharge: 2017-09-24 | Disposition: A | Discharge status: Home / Self Care | Payer: Medicare Other | Source: Ambulatory Visit | Attending: Radiation Oncology | Admitting: Radiation Oncology

## 2017-09-24 DIAGNOSIS — Z510 Encounter for antineoplastic radiation therapy: Secondary | ICD-10-CM | POA: Diagnosis not present

## 2017-09-25 ENCOUNTER — Ambulatory Visit
Admission: RE | Admit: 2017-09-25 | Discharge: 2017-09-25 | Disposition: A | Discharge status: Home / Self Care | Payer: Medicare Other | Source: Ambulatory Visit | Attending: Radiation Oncology | Admitting: Radiation Oncology

## 2017-09-25 ENCOUNTER — Telehealth: Payer: Self-pay | Admitting: Hematology

## 2017-09-25 DIAGNOSIS — Z510 Encounter for antineoplastic radiation therapy: Secondary | ICD-10-CM | POA: Diagnosis not present

## 2017-09-25 NOTE — Telephone Encounter (Signed)
Returned VM from [place] her appointment for 7/12 w/ Dr Burr Medico

## 2017-09-25 NOTE — Progress Notes (Signed)
[place]  Telephone:([phone]) [phone] Fax:(336) [phone]  Clinic Follow up Note   Patient Care Team: [name], MD as PCP - General [name], MD as Consulting Physician (General Surgery) Truitt Merle, MD as Consulting Physician (Hematology) Kyung Rudd, MD as Consulting Physician (Radiation Oncology)   Date of Service:  09/26/2017  CHIEF COMPLAINTS:  F/u for Malignancy neoplasm of lower-inner quadrant of right breast   Oncology History   Cancer Staging Malignant neoplasm of lower-inner quadrant of right breast of female, estrogen receptor positive ([place]) Staging form: Breast, AJCC 8th Edition - Clinical stage from 07/16/2017: Stage IA (cT1b, cN0, cM0, G2, ER+, PR+, HER2-) - Signed by Truitt Merle, MD on 07/23/2017       Malignant neoplasm of lower-inner quadrant of right breast of female, estrogen receptor positive ([place])   07/15/2017 Mammogram    IMPRESSION: The new irregular lesion in the right breast is indeterminate. An ultrasound is recommended.       07/15/2017 Imaging    [place] R Breast and Axilla 07/15/17 IMPRESSION:  The 6 mm mass in the right breast is suspicious for malignancy. A biopsy is recommended.       07/16/2017 Cancer Staging    Staging form: Breast, AJCC 8th Edition - Clinical stage from 07/16/2017: Stage IA (cT1b, cN0, cM0, G2, ER+, PR+, HER2-) - Signed by Truitt Merle, MD on 07/23/2017      07/16/2017 Initial Biopsy    Diagnosis 07/16/17 Breast, right, needle core biopsy, 3 o'clock, 7cm - INVASIVE DUCTAL CARCINOMA, SEE COMMENT. - DUCTAL CARCINOMA IN SITU WITH NECROSIS.      07/16/2017 Receptors her2    Estrogen Receptor: 100%, POSITIVE, STRONG STAINING INTENSITY Progesterone Receptor: 80%, POSITIVE, STRONG STAINING INTENSITY Proliferation Marker Ki67: 10% HER2 - NEGATIVE      07/22/2017 Initial Diagnosis    Malignant neoplasm of lower-inner quadrant of right breast of female, estrogen receptor positive ([place])      07/30/2017 Surgery    BREAST  LUMPECTOMY WITH RADIOACTIVE SEED AND SENTINEL LYMPH NODE BIOPSY by Dr. Barry Dienes  07/30/17       07/30/2017 Pathology Results    Diagnosis 07/30/17  1. Breast, lumpectomy, right - INVASIVE DUCTAL CARCINOMA, GRADE 2, SPANNING 0.6 CM. - INTERMEDIATE GRADE DUCTAL CARCINOMA IN SITU. - FINAL RESECTION MARGINS ARE NEGATIVE. - BIOPSY SITE. - SEE ONCOLOGY TABLE. 2. Breast, excision, right additional lateral margin - FIBROCYSTIC CHANGE. - NO MALIGNANCY IDENTIFIED. 3. Lymph node, sentinel, biopsy, right axillary #1 - ONE OF ONE LYMPH NODES NEGATIVE FOR CARCINOMA (0/1). 4. Lymph node, sentinel, biopsy, right axillary #2 - ONE OF ONE LYMPH NODES NEGATIVE FOR CARCINOMA (0/1). 5. Lymph node, sentinel, biopsy, right axillary #3 - ONE OF ONE LYMPH NODES NEGATIVE FOR CARCINOMA (0/1). 6. Lymph node, sentinel, biopsy, right axillary #4 - ONE OF ONE LYMPH NODES NEGATIVE FOR CARCINOMA (0/1).      07/30/2017 Oncotype testing    Oncotype 07/30/17 Recurrence score 20 with a 6% risk of distant recurrence on Tamoxifen alone and a <1% benefit of chemotherapy       09/01/2017 -  Radiation Therapy    Radiation with Dr. Lisbeth Renshaw  09/01/17 and plans to complete on 09/29/17.        HISTORY OF PRESENTING ILLNESS:  [name] 72 y.o. female is a here because of newly diagnosed breast cancer. The patient was referred by her PCP. The patient presents to the clinic today accompanied by her husband.  Prior to pt's abnormal mammogram, she states did  not feel any lump. She states she was compliant with yearly screening because she had an abnormal mammogram with a benign papilloma that was removed. She states she feels well overall, not noticing any weight loss, back pain or extensive joint pain.   Pt's diagnotic mammogram from 07/15/17 warranted further evaluation. Her ultrasound from the same day revealed a 6 mm mass in the right breast suspicious for malignancy. Her initial biopsy confirmed right breast invasive  carcinoma.   Estrogen Receptor: 100%, POSITIVE, STRONG STAINING INTENSITY Progesterone Receptor: 80%, POSITIVE, STRONG STAINING INTENSITY Proliferation Marker Ki67: 10% HER2 - NEGATIVE  She has a medical history of HTN and GERD. She has a surgical history of appendectomy and lumpectomy.   GYN HISTORY  Menarchal: 12 LMP: 7076, at 72 years old  HRT: None G3P2: first birth at age 30   She had a FMHx of CA by her paternal aunt who had cancer and died at a young age. Her mother had a benign brain tumor.  She denies tobacco use recently and she drinks alcohol socially. Socially, she is married, retired and lives at home.    CURRENT THERAPY  Adjuvant radiation 09/01/17 - 09/29/17    INTERVAL HISTORY [name] is here for a follow up and radiation treatment post surgery.  She presents to the clinic today by herself. She notes she is tolerating radiation overall well with moderate fatigue. She plans to complete on 7/15. She notes her menopause was asymptomatic in her 17s. She notes since surgery she has trigger finger of both hands. She denies swelling of her feet. She also asked if she is at risk for lasting effects from radiation and her scans. She notes she is interested in proceeding with antiestrogen therapy but will have to think about IV Zometa treatment. She plans to have a scan of her left parotid mass before considering surgery for removal.       MEDICAL HISTORY:  Past Medical History:  Diagnosis Date  . Arthritis   . Cancer ([place]) 06/2017   right breast cancer  . GERD (gastroesophageal reflux disease)    mild- TUMS used sparingly  . Hyperlipidemia   . Hypertension   . PONV (postoperative nausea and vomiting)    also "passed out with cyst removal from neck"superficial"  . Syncope    x2 episodes-evaluated and findings normal- "usually related to seeing blood or thought of it"  . Vaso vagal episode    easily    SURGICAL HISTORY: Past Surgical History:  Procedure  Laterality Date  . ABDOMINAL HYSTERECTOMY    . BREAST LUMPECTOMY WITH RADIOACTIVE SEED AND SENTINEL LYMPH NODE BIOPSY Right 07/30/2017   Procedure: BREAST LUMPECTOMY WITH RADIOACTIVE SEED AND SENTINEL LYMPH NODE BIOPSY;  Surgeon: [name], MD;  Location: [facility];  Service: General;  Laterality: Right;  . BREAST SURGERY  08/27/11   right lumpectomy w/needle localization  . BUNIONECTOMY Bilateral 2000   bilateral -hardware remains  . CYST REMOVAL NECK     sebaceous  cyst from neck  . DILATION AND CURETTAGE OF UTERUS    . INTRAUTERINE DEVICE INSERTION    . IUD REMOVAL    . LAPAROSCOPIC APPENDECTOMY N/A 03/27/2017   Procedure: APPENDECTOMY LAPAROSCOPIC;  Surgeon: Alphonsa Overall, MD;  Location: [facility];  Service: General;  Laterality: N/A;  . Removal Right Straphenous vein  2011  . TONSILLECTOMY     child  . TOTAL HIP ARTHROPLASTY Right 11/29/2015   Procedure: RIGHT TOTAL HIP ARTHROPLASTY ANTERIOR APPROACH;  Surgeon: Gaynelle Arabian, MD;  Location: [facility];  Service: Orthopedics;  Laterality: Right;    SOCIAL HISTORY: Social History   Socioeconomic History  . Marital status: Married    Spouse name: Not on file  . Number of children: Not on file  . Years of education: Not on file  . Highest education level: Not on file  Occupational History  . Not on file  Social Needs  . Financial resource strain: Not on file  . Food insecurity:    Worry: Not on file    Inability: Not on file  . Transportation needs:    Medical: Not on file    Non-medical: Not on file  Tobacco Use  . Smoking status: Former Smoker    Packs/day: 0.50    Years: 10.00    Pack years: 5.00    Last attempt to quit: 11/21/1985    Years since quitting: 31.8  . Smokeless tobacco: Never Used  Substance and Sexual Activity  . Alcohol use: Yes    Comment: Socially  . Drug use: No  . Sexual activity: Yes  Lifestyle  . Physical activity:    Days per week: Not on file    Minutes per session: Not on  file  . Stress: Not on file  Relationships  . Social connections:    Talks on phone: Not on file    Gets together: Not on file    Attends religious service: Not on file    Active member of club or organization: Not on file    Attends meetings of clubs or organizations: Not on file    Relationship status: Not on file  . Intimate partner violence:    Fear of current or ex partner: Not on file    Emotionally abused: Not on file    Physically abused: Not on file    Forced sexual activity: Not on file  Other Topics Concern  . Not on file  Social History Narrative  . Not on file    FAMILY HISTORY: Family History  Problem Relation Age of Onset  . Cancer Mother        benign brain tumor   . Cancer Paternal Aunt        unknown type     ALLERGIES:  is allergic to dermacinrx surgical pharmapak.  MEDICATIONS:  Current Outpatient Medications  Medication Sig Dispense Refill  . acetaminophen (TYLENOL) 500 MG tablet Take 2 tablets (1,000 mg total) by mouth every 8 (eight) hours. 100 tablet 2  . atorvastatin (LIPITOR) 80 MG tablet Take 80 mg by mouth every evening.    Marland Kitchen FINACEA 15 % cream Apply 1 application topically 2 (two) times daily. Applied to nose.  0  . MICARDIS HCT 40-12.5 MG per tablet Take 1 tablet by mouth daily. Taking Telmisartin 40-12.5 Hydrochlorothiazide po daily    . Multiple Vitamins-Minerals (WOMENS MULTIVITAMIN PO) Take 1 tablet by mouth daily.    . multivitamin-lutein (OCUVITE-LUTEIN) CAPS capsule Take 1 capsule by mouth daily.    . Omega-3 Fatty Acids (FISH OIL) 1200 MG CAPS Take 1,200 mg by mouth daily.    Marland Kitchen ZETIA 10 MG tablet Take 10 mg by mouth every evening.     Marland Kitchen anastrozole (ARIMIDEX) 1 MG tablet Take 1 tablet (1 mg total) by mouth daily. 30 tablet 2  . traMADol (ULTRAM) 50 MG tablet Take 1-2 tablets (50-100 mg total) by mouth every 6 (six) hours as needed for moderate pain (mild pain). (Patient not taking: Reported on  08/27/2017) 50 tablet 0   No current  facility-administered medications for this visit.     REVIEW OF SYSTEMS:   Constitutional: Denies fevers, chills or abnormal night sweats (+) fatigue  Eyes: Denies blurriness of vision, double vision or watery eyes Ears, nose, mouth, throat, and face: Denies mucositis or sore throat Respiratory: Denies cough, dyspnea or wheezes Cardiovascular: Denies palpitation, chest discomfort or lower extremity swelling Gastrointestinal:  Denies nausea, heartburn or change in bowel habits Skin: Denies abnormal skin rashes Lymphatics: Denies new lymphadenopathy or easy bruising Neurological:Denies numbness, tingling or new weaknesses Behavioral/Psych: Mood is stable, no new changes  All other systems were reviewed with the patient and are negative.  PHYSICAL EXAMINATION: ECOG PERFORMANCE STATUS: 0 - Asymptomatic  Vitals:   09/26/17 0854  BP: 135/87  Pulse: 79  Resp: 17  Temp: 97.8 F (36.6 C)  SpO2: 98%   Filed Weights   09/26/17 0854  Weight: 184 lb (83.5 kg)    GENERAL:alert, no distress and comfortable SKIN: skin color, texture, turgor are normal, no rashes or significant lesions EYES: normal, conjunctiva are pink and non-injected, sclera clear OROPHARYNX:no exudate, no erythema and lips, buccal mucosa, and tongue normal (+) Visible Left parotid gland mass NECK: supple, thyroid normal size, non-tender, without nodularity LYMPH:  no palpable lymphadenopathy in the cervical, axillary or inguinal LUNGS: clear to auscultation and percussion with normal breathing effort HEART: regular rate & rhythm and no murmurs and no lower extremity edema ABDOMEN:abdomen soft, non-tender and normal bowel sounds Musculoskeletal:no cyanosis of digits and no clubbing  PSYCH: alert & oriented x 3 with fluent speech NEURO: no focal motor/sensory deficits Breasts: Breast inspection showed them to be symmetrical with no nipple discharge. S/p Right breast lumpectomy: Her surgical incision is healed well. She  mild diffuse skin hyperpigmentation from radiation with no skin breakdown or ulcers.    LABORATORY DATA:  I have reviewed the data as listed CBC Latest Ref Rng & Units 07/23/2017 03/29/2017 03/27/2017  WBC 3.9 - 10.3 K/uL 6.3 13.5(H) 14.2(H)  Hemoglobin 11.6 - 15.9 g/dL 14.5 11.9(L) 15.2(H)  Hematocrit 34.8 - 46.6 % 42.8 36.1 44.5  Platelets 145 - 400 K/uL 202 213 212   CMP Latest Ref Rng & Units 07/23/2017 03/29/2017 03/27/2017  Glucose 70 - 140 mg/dL 95 122(H) 119(H)  BUN 7 - 26 mg/dL _0 Creatinine 0.60 - 1.10 mg/dL 0.86 0.78 0.91  Sodium 136 - 145 mmol/L 141 138 132(L)  Potassium 3.5 - 5.1 mmol/L 4.1 3.5 3.5  Chloride 98 - 109 mmol/L 105 105 99(L)  CO2 22 - 29 mmol/L _1 Calcium 8.4 - 10.4 mg/dL 10.0 8.6(L) 9.1  Total Protein 6.4 - 8.3 g/dL 7.5 - 8.4(H)  Total Bilirubin 0.2 - 1.2 mg/dL 0.7 - 1.7(H)  Alkaline Phos 40 - 150 U/L 74 - 78  AST 5 - 34 U/L 20 - 29  ALT 0 - 55 U/L 21 - 20   PATHOLOGY  Diagnosis 07/30/17  1. Breast, lumpectomy, right - INVASIVE DUCTAL CARCINOMA, GRADE 2, SPANNING 0.6 CM. - INTERMEDIATE GRADE DUCTAL CARCINOMA IN SITU. - FINAL RESECTION MARGINS ARE NEGATIVE. - BIOPSY SITE. - SEE ONCOLOGY TABLE. 2. Breast, excision, right additional lateral margin - FIBROCYSTIC CHANGE. - NO MALIGNANCY IDENTIFIED. 3. Lymph node, sentinel, biopsy, right axillary #1 - ONE OF ONE LYMPH NODES NEGATIVE FOR CARCINOMA (0/1). 4. Lymph node, sentinel, biopsy, right axillary #2 - ONE OF ONE LYMPH NODES NEGATIVE FOR CARCINOMA (0/1). 5. Lymph node, sentinel,  biopsy, right axillary #3 - ONE OF ONE LYMPH NODES NEGATIVE FOR CARCINOMA (0/1). 6. Lymph node, sentinel, biopsy, right axillary #4 - ONE OF ONE LYMPH NODES NEGATIVE FOR CARCINOMA (0/1). Microscopic Comment 1. INVASIVE CARCINOMA OF THE BREAST: Resection Procedure: Right breast lumpectomy and additional lateral margin. Right axillary sentinel lymph node biopsies. Specimen Laterality: Right. Tumor Size: 0.6  cm. Histologic Type: Invasive ductal carcinoma. Histologic Grade: Glandular (Acinar)/Tubular Differentiation: 2 Nuclear Pleomorphism: 3 Mitotic Rate: 1 Overall Grade: 2 Ductal Carcinoma In Situ: Present, intermediate grade. 1 of 3 FINAL for ANDRETTA, ERGLE [phone]) Microscopic Comment(continued) Tumor Extension: Confined to breast parenchyma. Margins: Distance from closest margin (millimeters): >1 cm final margins (part #2). Original lateral margin <1 mm broadly. Specify closest margin (required only if <11m): N/A. DCIS Margins Distance from closest margin (millimeters): >1 cm final margins (part #2). Specify closest margin (required only if <159m: N/A. Regional Lymph Nodes: Number of Lymph Nodes Examined: 4 Number of Sentinel Nodes Examined (if applicable): 4 Number of Lymph Nodes with Macrometastases (>2 mm): 0 Number of Lymph Nodes with Micrometastases: 0 Number of Lymph Nodes with Isolated Tumor Cells (0.2 mm or 200 cells)#: 0 Size of Largest Metastatic Deposit (millimeters): N/A. Extranodal Extension N/A. Treatment Effect: No known presurgical therapy Breast Biomarker Testing Performed on Previous Biopsy: Testing Performed on Case Number: [id number] Receptor: Positive, 100% strong staining. Progesterone Receptor: Positive, 80% strong staining. HER2: Negative. Representative tumor block: 1B-C Pathologic Stage Classification (pTNM, AJCC 8th Edition): pT1b, pN0 (v4.2.0.0)    Diagnosis 07/16/17 Breast, right, needle core biopsy, 3 o'clock, 7cm - INVASIVE DUCTAL CARCINOMA, SEE COMMENT. - DUCTAL CARCINOMA IN SITU WITH NECROSIS. Microscopic Comment The carcinoma appears grade 2. Prognostic markers will be ordered. Dr. KiLyndon Codeas reviewed the case. The case was called to [facility] 07/17/2017. Results: Estrogen Receptor: 100%, POSITIVE, STRONG STAINING INTENSITY Progesterone Receptor: 80%, POSITIVE, STRONG STAINING INTENSITY Proliferation Marker  Ki67: 10% HER2 - NEGATIVE     RADIOGRAPHIC STUDIES: I have personally reviewed the radiological images as listed and agreed with the findings in the report. No results found.   [place] Breast and Axilla 07/15/17 IMPRESSION:  The 6 mm mass in the right breast is suspicious for malignancy. A biopsy is recommended.   Diagnostic Mammogram 07/15/17 IMPRESSION: The new irregular lesion in the right breast is indeterminate. An ultrasound is recommended.   ASSESSMENT & PLAN:  [name] [age]ear old post-menopausal woman, presented with screening discovered right breast cancer.   1. 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 -Given her early stage disease she was a candidate for breast conservation and she underwent right breast lumpectomy and SLNB by Dr. ByBarry Dienesn 07/30/17.  -I reviewed her surgical pathology which showed complete resection with negative margins, node negative.  -Her Oncotype score was 20 with 9-year distant recurrence risk of 6% with tamoxifen or AI. I do not recommend adjuvant chemotherapy.  -She proceeded with radiation to reduce her risk of local recurrence by Dr. MoLisbeth Renshaw/17/19 and plans to complete on 09/29/17.  -Given the strong ER and PR expression in her postmenopausal status, I recommend adjuvant endocrine therapy with for a total of 5-10 years to reduce the risk of cancer recurrence.  I discussed the option of tamoxifen versus AI.  -Potential benefits and side effects of Tamoxifen, which includes  but not limited to, hot flash, skin and vaginal dryness, slightly increased risk of cardiovascular disease and cataract, small risk of thrombosis and endometrial cancer, were discussed with her in great details.  -The potential benefit and side effects, which includes but not  limited to, hot flash, skin and vaginal dryness, metabolic changes ( increased blood glucose, cholesterol, weight, etc.), slightly in increased risk of cardiovascular disease, cataracts, muscular and joint discomfort, osteopenia and osteoporosis, etc, were discussed with her in great details. She is interested, and we'll start after she completes radiation. -She recently had a pelvic fracture without injury and her recent bone density scan showed osteopenia, I recommend her to consider biphosphonate, especially Zometa every 6 months for 2 years, which is approved as adjuvant therapy for early stage breast cancer, which also slightly decrease risk of bone metastasis.  Potential benefit and side effects discussed with her.  She will think about it. -I recommend she start with anastrozole, I provided her with reading material of this medication.  After lengthy discussion, she is agreeable to start this month.  -I reviewed her family history of cancer which makes her eligible for genetic tested. She declined genetic testing, she has no daughters.  -I answered all her questions to her understanding and satisfaction.  -F/u in 3 months    2. Bone Health  -She notes having a fracture in early 2019 with little physical cause which is a concern for bone weakness and fracture risk.  -Pt notes her previous DEXA from early 2019 with Dr. Coral Spikes showed borderline osteopenia. I discussed Anastrozole may further lower her bone density. I will request scan results.  -She is currently on daily calcium supplement  -I discussed the option of IV Zometa to strengthen her bone and reduce risk of cancer recurrence in the bone. I provided her with reading material on this medication. She will discuss this with her PCP and think about this.    3. Left Parotid gland mass  -Pt plans to have scan soon to rule out cancer. It has been stable over the last 2 years, this is likely benign.     4. HTN -Continue medication and  follow-up with PCP   PLAN:  -Continue Radiation  -I prescribed her Anastrozole today, to start this month (in 2-3 weeks) -Lab and f/u in 3 months    No orders of the defined types were placed in this encounter.  All questions were answered. The patient knows to call the clinic with any problems, questions or concerns.  I spent 30 minutes counseling the patient face to face. The total time spent in the appointment was 40 minutes and more than 50% was on counseling.  Oneal Deputy, am acting as scribe for Truitt Merle, MD.   I have reviewed the above documentation for accuracy and completeness, and I agree with the above.     Truitt Merle, MD 09/26/2017

## 2017-09-26 ENCOUNTER — Encounter: Payer: Self-pay | Admitting: Hematology

## 2017-09-26 ENCOUNTER — Telehealth: Payer: Self-pay

## 2017-09-26 ENCOUNTER — Ambulatory Visit
Admission: RE | Admit: 2017-09-26 | Discharge: 2017-09-26 | Disposition: A | Discharge status: Home / Self Care | Payer: Medicare Other | Source: Ambulatory Visit | Attending: Radiation Oncology | Admitting: Radiation Oncology

## 2017-09-26 ENCOUNTER — Inpatient Hospital Stay: Payer: Medicare Other | Attending: Hematology | Admitting: Hematology

## 2017-09-26 VITALS — BP 135/87 | HR 79 | Temp 97.8°F | Resp 17 | Ht 65.0 in | Wt 184.0 lb

## 2017-09-26 DIAGNOSIS — R5383 Other fatigue: Secondary | ICD-10-CM

## 2017-09-26 DIAGNOSIS — E785 Hyperlipidemia, unspecified: Secondary | ICD-10-CM | POA: Insufficient documentation

## 2017-09-26 DIAGNOSIS — Z79811 Long term (current) use of aromatase inhibitors: Secondary | ICD-10-CM | POA: Insufficient documentation

## 2017-09-26 DIAGNOSIS — M8580 Other specified disorders of bone density and structure, unspecified site: Secondary | ICD-10-CM | POA: Insufficient documentation

## 2017-09-26 DIAGNOSIS — Z79899 Other long term (current) drug therapy: Secondary | ICD-10-CM

## 2017-09-26 DIAGNOSIS — M1990 Unspecified osteoarthritis, unspecified site: Secondary | ICD-10-CM | POA: Insufficient documentation

## 2017-09-26 DIAGNOSIS — C50311 Malignant neoplasm of lower-inner quadrant of right female breast: Secondary | ICD-10-CM | POA: Insufficient documentation

## 2017-09-26 DIAGNOSIS — K219 Gastro-esophageal reflux disease without esophagitis: Secondary | ICD-10-CM | POA: Insufficient documentation

## 2017-09-26 DIAGNOSIS — I10 Essential (primary) hypertension: Secondary | ICD-10-CM | POA: Diagnosis not present

## 2017-09-26 DIAGNOSIS — D37030 Neoplasm of uncertain behavior of the parotid salivary glands: Secondary | ICD-10-CM | POA: Insufficient documentation

## 2017-09-26 DIAGNOSIS — Z87891 Personal history of nicotine dependence: Secondary | ICD-10-CM | POA: Diagnosis not present

## 2017-09-26 DIAGNOSIS — Z510 Encounter for antineoplastic radiation therapy: Secondary | ICD-10-CM | POA: Diagnosis not present

## 2017-09-26 DIAGNOSIS — Z170 Estrogen receptor positive status [ER+]: Secondary | ICD-10-CM | POA: Diagnosis not present

## 2017-09-26 MED ORDER — ANASTROZOLE 1 MG PO TABS: 1.0000 mg | ORAL_TABLET | Freq: Every day | ORAL | 2 refills | Status: DC

## 2017-09-26 NOTE — Telephone Encounter (Signed)
Printed avs and calender of upcoming appointment. Per 7/12 los. Patient requested appointment be on a [place]

## 2017-09-29 ENCOUNTER — Ambulatory Visit
Admission: RE | Admit: 2017-09-29 | Discharge: 2017-09-29 | Disposition: A | Discharge status: Home / Self Care | Payer: Medicare Other | Source: Ambulatory Visit | Attending: Radiation Oncology | Admitting: Radiation Oncology

## 2017-09-29 ENCOUNTER — Encounter: Payer: Self-pay | Admitting: Radiation Oncology

## 2017-09-29 ENCOUNTER — Other Ambulatory Visit: Payer: Self-pay | Admitting: Otolaryngology

## 2017-09-29 ENCOUNTER — Telehealth: Payer: Self-pay | Admitting: Adult Health

## 2017-09-29 ENCOUNTER — Encounter: Payer: Self-pay | Admitting: *Deleted

## 2017-09-29 DIAGNOSIS — Z510 Encounter for antineoplastic radiation therapy: Secondary | ICD-10-CM | POA: Diagnosis not present

## 2017-09-29 DIAGNOSIS — K118 Other diseases of salivary glands: Secondary | ICD-10-CM

## 2017-09-29 NOTE — Telephone Encounter (Signed)
Tried to call regarding 12/16

## 2017-10-08 ENCOUNTER — Ambulatory Visit
Admission: RE | Admit: 2017-10-08 | Discharge: 2017-10-08 | Disposition: A | Discharge status: Home / Self Care | Payer: Medicare Other | Source: Ambulatory Visit | Attending: Otolaryngology | Admitting: Otolaryngology

## 2017-10-08 DIAGNOSIS — K118 Other diseases of salivary glands: Secondary | ICD-10-CM

## 2017-10-08 MED ORDER — IOPAMIDOL (ISOVUE-300) INJECTION 61%
75.0000 mL | Freq: Once | INTRAVENOUS | Status: AC | PRN
  Administered 2017-10-08: 75 mL via INTRAVENOUS

## 2017-10-08 NOTE — Progress Notes (Signed)
  Radiation Oncology         [phone] ________________________________  Name: [name] MRN: [id number]  Date: 09/29/2017  DOB: 1945/12/26  End of Treatment Note  Diagnosis:   72 y.o. female with Stage IA, pT1bN0M0, grade 2, ER/PR positive invasive ductal carcinoma with DCIS of the right breast   Indication for treatment:  Curative       Radiation treatment dates:   09/01/2017 - 09/29/2017  Site/dose:   The patient initially received a dose of 42.56 Gy in 16 fractions to the right breast using whole-breast tangent fields. This was delivered using a 3-D conformal technique. The patient then received a boost to the seroma. This delivered an additional 8 Gy in 4 fractions using an en face electron field due to the depth of the seroma. The total dose was 50.56 Gy.  Narrative: The patient tolerated radiation treatment relatively well.  The patient had some expected skin irritation as she progressed during treatment. Moist desquamation was not present at the end of treatment.  Plan: The patient has completed radiation treatment. The patient will return to radiation oncology clinic for routine followup in one month. I advised the patient to call or return sooner if they have any questions or concerns related to their recovery or treatment. ________________________________  Jodelle Gross, MD, PhD  This document serves as a record of services personally performed by Kyung Rudd, MD. It was created on his behalf by Rae Lips, a trained medical scribe. The creation of this record is based on the scribe's personal observations and the provider's statements to them. This document has been checked and approved by the attending provider.

## 2017-10-20 ENCOUNTER — Ambulatory Visit
Admission: RE | Admit: 2017-10-20 | Discharge: 2017-10-20 | Disposition: A | Discharge status: Home / Self Care | Payer: Medicare Other | Source: Ambulatory Visit | Attending: Radiation Oncology | Admitting: Radiation Oncology

## 2017-10-20 ENCOUNTER — Encounter: Payer: Self-pay | Admitting: Radiation Oncology

## 2017-10-20 ENCOUNTER — Other Ambulatory Visit: Payer: Self-pay

## 2017-10-20 VITALS — BP 123/97 | HR 85 | Temp 98.0°F | Resp 18 | Ht 65.0 in | Wt 184.4 lb

## 2017-10-20 DIAGNOSIS — C50311 Malignant neoplasm of lower-inner quadrant of right female breast: Secondary | ICD-10-CM

## 2017-10-20 DIAGNOSIS — D0511 Intraductal carcinoma in situ of right breast: Secondary | ICD-10-CM | POA: Insufficient documentation

## 2017-10-20 DIAGNOSIS — Z923 Personal history of irradiation: Secondary | ICD-10-CM | POA: Diagnosis not present

## 2017-10-20 DIAGNOSIS — Z853 Personal history of malignant neoplasm of breast: Secondary | ICD-10-CM | POA: Insufficient documentation

## 2017-10-20 DIAGNOSIS — Z79899 Other long term (current) drug therapy: Secondary | ICD-10-CM | POA: Diagnosis not present

## 2017-10-20 DIAGNOSIS — Z170 Estrogen receptor positive status [ER+]: Secondary | ICD-10-CM | POA: Diagnosis not present

## 2017-10-20 NOTE — Progress Notes (Signed)
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 This was delivered using a 3-D conformal technique. The patient then received a boost to the seroma. This delivered an additional 8 Gy in 4 fractions using an en face electron field due to the depth of the seroma. The total dose was 50.56 Gy.   Narrative:  The patient returns today for routine follow-up. During treatment she did very well with radiotherapy and did not have significant desquamation.                             On review of systems, the patient states she's doing well. She does have left parotid surgery coming up on 11/30/17. She has not noticed any trouble with dry mouth. She does note facial asymmetry. She denies any concerns with breast pain, but does have some itching of the skin. No other complaints are noted.  ALLERGIES:  is allergic to dermacinrx surgical pharmapak.  Meds: Current Outpatient Medications  Medication Sig Dispense Refill  . anastrozole (ARIMIDEX) 1 MG tablet Take 1 tablet (1 mg total) by mouth daily. 30 tablet 2  . atorvastatin (LIPITOR) 80 MG tablet Take 80 mg by mouth every evening.    Marland Kitchen FINACEA 15 % cream Apply 1 application topically 2 (two) times daily. Applied to nose.  0  . MICARDIS HCT 40-12.5 MG per tablet Take 1 tablet by mouth daily. Taking Telmisartin 40-12.5 Hydrochlorothiazide po daily    . Multiple Vitamins-Minerals (WOMENS MULTIVITAMIN PO) Take 1 tablet by mouth daily.    . multivitamin-lutein (OCUVITE-LUTEIN) CAPS  capsule Take 1 capsule by mouth daily.    . Omega-3 Fatty Acids (FISH OIL) 1200 MG CAPS Take 1,200 mg by mouth daily.    Marland Kitchen ZETIA 10 MG tablet Take 10 mg by mouth every evening.     Marland Kitchen acetaminophen (TYLENOL) 500 MG tablet Take 2 tablets (1,000 mg total) by mouth every 8 (eight) hours. (Patient not taking: Reported on 10/20/2017) 100 tablet 2  . traMADol (ULTRAM) 50 MG tablet Take 1-2 tablets (50-100 mg total) by mouth every 6 (six) hours as needed for moderate pain (mild pain). (Patient not taking: Reported on 10/20/2017) 50 tablet 0   No current facility-administered medications for this encounter.     Physical Findings:  height is 5\' 5"  (1.651 m) and weight is 184 lb 6.4 oz (83.6 kg). Her oral temperature is 98 F (36.7 C). Her blood pressure is 123/97 (abnormal) and her pulse is 85. Her respiration is 18 and oxygen saturation is 99%.  Pain Assessment Pain Score: 0-No pain/10 In general this is a well appearing caucasian female in no acute distress. She's alert and oriented x4 and appropriate throughout the examination. She has left parotid enlargement. Cardiopulmonary assessment is negative for acute distress and she exhibits normal effort. The right breast was examined and reveals mild hyperpigmentation and dryness.   Lab Findings: Lab Results  Component Value Date   WBC 6.3 07/23/2017  HGB 14.5 07/23/2017   HCT 42.8 07/23/2017   MCV 94.2 07/23/2017   PLT 202 07/23/2017     Radiographic Findings: Ct Soft Tissue Neck W Contrast  Result Date: 10/08/2017 CLINICAL DATA:  Left parotid mass.  History of breast cancer. Creatinine was obtained on site at [facility] at [address] Results: Creatinine 0.7 mg/dL. EXAM: CT NECK WITH CONTRAST TECHNIQUE: Multidetector CT imaging of the neck was performed using the standard protocol following the bolus administration of intravenous contrast. CONTRAST:  42mL ISOVUE-300 IOPAMIDOL (ISOVUE-300) INJECTION 61% COMPARISON:  None. FINDINGS:  Pharynx and larynx: Symmetric pharyngeal soft tissue contours without evidence of mass. Small left tonsillar calcification. No retropharyngeal fluid. Closed glottis. Salivary glands: A heterogeneous, mixed cystic and solid mass in the anterior aspect of the superficial left parotid gland measures 3.0 x 2.5 x 3.9 cm with a few small calcifications superiorly. The mass is inseparable from the posterior aspect of the masseter muscle without gross invasion. The stylomastoid foramina are symmetric and normal in appearance. The right parotid gland and both submandibular glands are unremarkable. Thyroid: Unremarkable. Lymph nodes: No enlarged or suspicious lymph nodes in the neck. Vascular: Unremarkable. Scattered mild atherosclerotic calcification. Limited intracranial: Unremarkable. Visualized orbits: Unremarkable. Mastoids and visualized paranasal sinuses: Clear. Skeleton: Focally advanced disc degeneration on the right at C5-6 with degenerative endplate sclerosis and spurring contributing to moderate right neural foraminal stenosis. Moderately severe left facet arthrosis at C4-5 contributes to moderate left neural foraminal stenosis. No suspicious osseous lesion. Upper chest: Clear lung apices. Other: None. IMPRESSION: 1. 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 2. No evidence of cervical lymphadenopathy. Electronically Signed   By: Logan Bores M.D.   On: 10/08/2017 15:35    Impression/Plan: 1. 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 The patient has been doing well since completion of radiotherapy. We discussed that we would be happy to continue to follow her as needed, but she will also continue to follow up with Dr. Burr Medico in medical oncology. She was counseled on skin care as well as measures to avoid sun exposure to this area.  2. Survivorship. We discussed the importance of survivorship evaluation and she is currently  scheduled for this in the near future. She was also given the monthly calendar for access to resources offered within the cancer center. 3. Left parotid enlargement. The patient will have surgery to resect this on 9/15 with Dr. Wilburn Cornelia.      Carola Rhine, PAC

## 2017-10-20 NOTE — Addendum Note (Signed)
Encounter addended by: Malena Edman, RN on: 10/20/2017 8:29 AM  Actions taken: Charge Capture section accepted

## 2017-11-14 ENCOUNTER — Other Ambulatory Visit: Payer: Self-pay | Admitting: Otolaryngology

## 2017-11-14 NOTE — Pre-Procedure Instructions (Signed)
[name]  11/14/2017      [id number] [address] AT [address] [place] [address] Phone: [phone] Fax: [phone]    Your procedure is scheduled on September 13  Report to [facility] at [facility].  Call this number if you have problems the morning of surgery:  [phone]   Remember:  Do not eat or drink after midnight.    Take these medicines the morning of surgery with A SIP OF WATER  anastrozole (ARIMIDEX  7 days prior to surgery STOP taking any Aspirin(unless otherwise instructed by your surgeon), Aleve, Naproxen, Ibuprofen, Motrin, Advil, Goody's, BC's, all herbal medications, fish oil, and all vitamins    Do not wear jewelry, make-up or nail polish.  Do not wear lotions, powders, or perfumes, or deodorant.  Do not shave 48 hours prior to surgery.   Do not bring valuables to the hospital.  [facility] is not responsible for any belongings or valuables.  Contacts, dentures or bridgework may not be worn into surgery.  Leave your suitcase in the car.  After surgery it may be brought to your room.  For patients admitted to the hospital, discharge time will be determined by your treatment team.  Patients discharged the day of surgery will not be allowed to drive home.    Special instructions:   [facility]- Preparing For Surgery  Before surgery, you can play an important role. Because skin is not sterile, your skin needs to be as free of germs as possible. You can reduce the number of germs on your skin by washing with CHG (chlorahexidine gluconate) Soap before surgery.  CHG is an antiseptic cleaner which kills germs and bonds with the skin to continue killing germs even after washing.    Oral Hygiene is also important to reduce your risk of infection.  Remember - BRUSH YOUR TEETH THE MORNING OF SURGERY WITH YOUR REGULAR TOOTHPASTE  Please  do not use if you have an allergy to CHG or antibacterial soaps. If your skin becomes reddened/irritated stop using the CHG.  Do not shave (including legs and underarms) for at least 48 hours prior to first CHG shower. It is OK to shave your face.  Please follow these instructions carefully.   1. Shower the NIGHT BEFORE SURGERY and the MORNING OF SURGERY with CHG.   2. If you chose to wash your hair, wash your hair first as usual with your normal shampoo.  3. After you shampoo, rinse your hair and body thoroughly to remove the shampoo.  4. Use CHG as you would any other liquid soap. You can apply CHG directly to the skin and wash gently with a scrungie or a clean washcloth.   5. Apply the CHG Soap to your body ONLY FROM THE NECK DOWN.  Do not use on open wounds or open sores. Avoid contact with your eyes, ears, mouth and genitals (private parts). Wash Face and genitals (private parts)  with your normal soap.  6. Wash thoroughly, paying special attention to the area where your surgery will be performed.  7. Thoroughly rinse your body with warm water from the neck down.  8. DO NOT shower/wash with your normal soap after using and rinsing off the CHG Soap.  9. Pat yourself dry with a CLEAN TOWEL.  10. Wear CLEAN PAJAMAS to bed the night before surgery, wear comfortable clothes the morning  of surgery  11. Place CLEAN SHEETS on your bed the night of your first shower and DO NOT SLEEP WITH PETS.    Day of Surgery:  Do not apply any deodorants/lotions.  Please wear clean clothes to the hospital/surgery center.   Remember to brush your teeth WITH YOUR REGULAR TOOTHPASTE.    Please read over the following fact sheets that you were given.

## 2017-11-18 ENCOUNTER — Encounter (HOSPITAL_COMMUNITY): Payer: Self-pay

## 2017-11-18 ENCOUNTER — Other Ambulatory Visit: Payer: Self-pay

## 2017-11-18 ENCOUNTER — Encounter (HOSPITAL_COMMUNITY)
Admission: RE | Admit: 2017-11-18 | Discharge: 2017-11-18 | Disposition: A | Discharge status: Home / Self Care | Payer: Medicare Other | Source: Ambulatory Visit | Attending: Otolaryngology | Admitting: Otolaryngology

## 2017-11-18 DIAGNOSIS — Z01812 Encounter for preprocedural laboratory examination: Secondary | ICD-10-CM | POA: Diagnosis present

## 2017-11-18 LAB — CBC
HCT: 45.8 % (ref 36.0–46.0)
Hemoglobin: 14.9 g/dL (ref 12.0–15.0)
MCH: 32.1 pg (ref 26.0–34.0)
MCHC: 32.5 g/dL (ref 30.0–36.0)
MCV: 98.7 fL (ref 78.0–100.0)
PLATELETS: 205 10*3/uL (ref 150–400)
RBC: 4.64 MIL/uL (ref 3.87–5.11)
RDW: 12.6 % (ref 11.5–15.5)
WBC: 5.6 10*3/uL (ref 4.0–10.5)

## 2017-11-18 LAB — BASIC METABOLIC PANEL
ANION GAP: 10 (ref 5–15)
BUN: 19 mg/dL (ref 8–23)
CALCIUM: 9.9 mg/dL (ref 8.9–10.3)
CO2: 25 mmol/L (ref 22–32)
Chloride: 104 mmol/L (ref 98–111)
Creatinine, Ser: 0.85 mg/dL (ref 0.44–1.00)
GFR calc Af Amer: >60 mL/min (ref >60)
GLUCOSE: 98 mg/dL (ref 70–99)
POTASSIUM: 4.2 mmol/L (ref 3.5–5.1)
SODIUM: 139 mmol/L (ref 135–145)

## 2017-11-18 NOTE — Progress Notes (Signed)
PCP - [name] Cardiologist - denies  Chest x-ray - not needed EKG - 07/2017 Stress Test - denies ECHO - denies Cardiac Cath - denies  Anesthesia review: NO  Patient denies shortness of breath, fever, cough and chest pain at PAT appointment   Patient verbalized understanding of instructions that were given to them at the PAT appointment. Patient was also instructed that they will need to review over the PAT instructions again at home before surgery.

## 2017-11-28 ENCOUNTER — Ambulatory Visit (HOSPITAL_COMMUNITY): Payer: Medicare Other | Admitting: Certified Registered"

## 2017-11-28 ENCOUNTER — Encounter (HOSPITAL_COMMUNITY): Payer: Self-pay | Admitting: Otolaryngology

## 2017-11-28 ENCOUNTER — Other Ambulatory Visit: Payer: Self-pay

## 2017-11-28 ENCOUNTER — Observation Stay (HOSPITAL_COMMUNITY)
Admission: RE | Admit: 2017-11-28 | Discharge: 2017-11-29 | Disposition: A | Discharge status: Home / Self Care | Payer: Medicare Other | Source: Ambulatory Visit | Attending: Otolaryngology | Admitting: Otolaryngology

## 2017-11-28 ENCOUNTER — Encounter (HOSPITAL_COMMUNITY)
Admission: RE | Disposition: A | Discharge status: Home / Self Care | Payer: Self-pay | Source: Ambulatory Visit | Attending: Otolaryngology

## 2017-11-28 DIAGNOSIS — I10 Essential (primary) hypertension: Secondary | ICD-10-CM | POA: Insufficient documentation

## 2017-11-28 DIAGNOSIS — K219 Gastro-esophageal reflux disease without esophagitis: Secondary | ICD-10-CM | POA: Insufficient documentation

## 2017-11-28 DIAGNOSIS — D110 Benign neoplasm of parotid gland: Secondary | ICD-10-CM | POA: Diagnosis not present

## 2017-11-28 DIAGNOSIS — Z79899 Other long term (current) drug therapy: Secondary | ICD-10-CM | POA: Insufficient documentation

## 2017-11-28 DIAGNOSIS — Z853 Personal history of malignant neoplasm of breast: Secondary | ICD-10-CM | POA: Insufficient documentation

## 2017-11-28 DIAGNOSIS — Z87891 Personal history of nicotine dependence: Secondary | ICD-10-CM | POA: Diagnosis not present

## 2017-11-28 DIAGNOSIS — K118 Other diseases of salivary glands: Secondary | ICD-10-CM

## 2017-11-28 DIAGNOSIS — E785 Hyperlipidemia, unspecified: Secondary | ICD-10-CM | POA: Diagnosis not present

## 2017-11-28 HISTORY — PX: PAROTIDECTOMY: SUR1003

## 2017-11-28 HISTORY — PX: PAROTIDECTOMY: SHX2163

## 2017-11-28 SURGERY — EXCISION, PAROTID GLAND
Anesthesia: General | Site: Neck | Laterality: Left

## 2017-11-28 MED ORDER — LIDOCAINE 2% (20 MG/ML) 5 ML SYRINGE
INTRAMUSCULAR | Status: DC | PRN
  Administered 2017-11-28: 80 mg via INTRAVENOUS

## 2017-11-28 MED ORDER — PHENYLEPHRINE 40 MCG/ML (10ML) SYRINGE FOR IV PUSH (FOR BLOOD PRESSURE SUPPORT)
PREFILLED_SYRINGE | INTRAVENOUS | Status: DC | PRN
  Administered 2017-11-28: 200 ug via INTRAVENOUS
  Administered 2017-11-28: 160 ug via INTRAVENOUS

## 2017-11-28 MED ORDER — SODIUM CHLORIDE 0.9 % IV SOLN
INTRAVENOUS | Status: DC | PRN
  Administered 2017-11-28: 50 ug/min via INTRAVENOUS

## 2017-11-28 MED ORDER — VITAMIN D3 25 MCG (1000 UNIT) PO TABS
2000.0000 [IU] | ORAL_TABLET | Freq: Every day | ORAL | Status: DC
  Administered 2017-11-28: 2000 [IU] via ORAL
  Filled 2017-11-28 (×3): qty 2

## 2017-11-28 MED ORDER — PROPOFOL 10 MG/ML IV BOLUS
INTRAVENOUS | Status: AC
  Filled 2017-11-28: qty 20

## 2017-11-28 MED ORDER — ANASTROZOLE 1 MG PO TABS: 1.0000 mg | ORAL_TABLET | Freq: Every day | ORAL | Status: DC

## 2017-11-28 MED ORDER — BACITRACIN ZINC 500 UNIT/GM EX OINT
TOPICAL_OINTMENT | CUTANEOUS | Status: AC
  Filled 2017-11-28: qty 28.35

## 2017-11-28 MED ORDER — PROMETHAZINE HCL 25 MG/ML IJ SOLN: 6.2500 mg | INTRAMUSCULAR | Status: DC | PRN

## 2017-11-28 MED ORDER — ACETAMINOPHEN 160 MG/5ML PO SOLN: 650.0000 mg | ORAL | Status: DC | PRN

## 2017-11-28 MED ORDER — IRBESARTAN 150 MG PO TABS
150.0000 mg | ORAL_TABLET | Freq: Every day | ORAL | Status: DC
  Administered 2017-11-28: 150 mg via ORAL
  Filled 2017-11-28: qty 1

## 2017-11-28 MED ORDER — GLYCOPYRROLATE PF 0.2 MG/ML IJ SOSY
PREFILLED_SYRINGE | INTRAMUSCULAR | Status: DC | PRN
  Administered 2017-11-28: .2 mg via INTRAVENOUS

## 2017-11-28 MED ORDER — ONDANSETRON HCL 4 MG/2ML IJ SOLN
INTRAMUSCULAR | Status: DC | PRN
  Administered 2017-11-28: 4 mg via INTRAVENOUS

## 2017-11-28 MED ORDER — BACITRACIN ZINC 500 UNIT/GM EX OINT
TOPICAL_OINTMENT | CUTANEOUS | Status: DC | PRN
  Administered 2017-11-28: 1 via TOPICAL

## 2017-11-28 MED ORDER — ATORVASTATIN CALCIUM 80 MG PO TABS
80.0000 mg | ORAL_TABLET | Freq: Every evening | ORAL | Status: DC
  Administered 2017-11-28: 80 mg via ORAL
  Filled 2017-11-28: qty 1

## 2017-11-28 MED ORDER — WHITE PETROLATUM EX OINT
TOPICAL_OINTMENT | CUTANEOUS | Status: AC
  Administered 2017-11-28: 13:00:00
  Filled 2017-11-28: qty 28.35

## 2017-11-28 MED ORDER — LIDOCAINE-EPINEPHRINE 1 %-1:100000 IJ SOLN
INTRAMUSCULAR | Status: AC
  Filled 2017-11-28: qty 1

## 2017-11-28 MED ORDER — HYDROCODONE-ACETAMINOPHEN 5-325 MG PO TABS
1.0000 | ORAL_TABLET | ORAL | Status: DC | PRN
  Filled 2017-11-28: qty 2

## 2017-11-28 MED ORDER — CALCIUM CARBONATE-VITAMIN D 500-200 MG-UNIT PO TABS
1.0000 | ORAL_TABLET | Freq: Every day | ORAL | Status: DC
  Administered 2017-11-29: 1 via ORAL
  Filled 2017-11-28: qty 1

## 2017-11-28 MED ORDER — ONDANSETRON HCL 4 MG PO TABS: 4.0000 mg | ORAL_TABLET | ORAL | Status: DC | PRN

## 2017-11-28 MED ORDER — ESMOLOL HCL 100 MG/10ML IV SOLN
INTRAVENOUS | Status: DC | PRN
  Administered 2017-11-28 (×2): 30 mg via INTRAVENOUS

## 2017-11-28 MED ORDER — FENTANYL CITRATE (PF) 250 MCG/5ML IJ SOLN
INTRAMUSCULAR | Status: AC
  Filled 2017-11-28: qty 5

## 2017-11-28 MED ORDER — DEXAMETHASONE SODIUM PHOSPHATE 10 MG/ML IJ SOLN
10.0000 mg | Freq: Once | INTRAMUSCULAR | Status: DC
  Filled 2017-11-28: qty 1

## 2017-11-28 MED ORDER — FENTANYL CITRATE (PF) 250 MCG/5ML IJ SOLN
INTRAMUSCULAR | Status: DC | PRN
  Administered 2017-11-28 (×3): 50 ug via INTRAVENOUS
  Administered 2017-11-28: 100 ug via INTRAVENOUS

## 2017-11-28 MED ORDER — HYDROMORPHONE HCL 1 MG/ML IJ SOLN: 0.2500 mg | INTRAMUSCULAR | Status: DC | PRN

## 2017-11-28 MED ORDER — EZETIMIBE 10 MG PO TABS
10.0000 mg | ORAL_TABLET | Freq: Every evening | ORAL | Status: DC
  Administered 2017-11-28: 10 mg via ORAL
  Filled 2017-11-28: qty 1

## 2017-11-28 MED ORDER — ONDANSETRON HCL 4 MG/2ML IJ SOLN: 4.0000 mg | INTRAMUSCULAR | Status: DC | PRN

## 2017-11-28 MED ORDER — LIDOCAINE-EPINEPHRINE 1 %-1:100000 IJ SOLN
INTRAMUSCULAR | Status: DC | PRN
  Administered 2017-11-28: 5 mL

## 2017-11-28 MED ORDER — TRAMADOL HCL 50 MG PO TABS
50.0000 mg | ORAL_TABLET | Freq: Four times a day (QID) | ORAL | Status: DC | PRN
  Administered 2017-11-28: 50 mg via ORAL
  Filled 2017-11-28: qty 1

## 2017-11-28 MED ORDER — MEPERIDINE HCL 50 MG/ML IJ SOLN: 6.2500 mg | INTRAMUSCULAR | Status: DC | PRN

## 2017-11-28 MED ORDER — LACTATED RINGERS IV SOLN
INTRAVENOUS | Status: DC | PRN
  Administered 2017-11-28: 07:00:00 via INTRAVENOUS

## 2017-11-28 MED ORDER — HYDROCHLOROTHIAZIDE 12.5 MG PO CAPS
12.5000 mg | ORAL_CAPSULE | Freq: Every day | ORAL | Status: DC
  Administered 2017-11-28: 12.5 mg via ORAL
  Filled 2017-11-28: qty 1

## 2017-11-28 MED ORDER — STERILE WATER FOR IRRIGATION IR SOLN
Status: DC | PRN
  Administered 2017-11-28: 1000 mL

## 2017-11-28 MED ORDER — PROPOFOL 10 MG/ML IV BOLUS
INTRAVENOUS | Status: DC | PRN
  Administered 2017-11-28: 200 mg via INTRAVENOUS

## 2017-11-28 MED ORDER — HEMOSTATIC AGENTS (NO CHARGE) OPTIME
TOPICAL | Status: DC | PRN
  Administered 2017-11-28: 1 via TOPICAL

## 2017-11-28 MED ORDER — EPHEDRINE SULFATE-NACL 50-0.9 MG/10ML-% IV SOSY
PREFILLED_SYRINGE | INTRAVENOUS | Status: DC | PRN
  Administered 2017-11-28: 10 mg via INTRAVENOUS
  Administered 2017-11-28 (×2): 5 mg via INTRAVENOUS
  Administered 2017-11-28: 10 mg via INTRAVENOUS

## 2017-11-28 MED ORDER — DEXTROSE IN LACTATED RINGERS 5 % IV SOLN
INTRAVENOUS | Status: DC
  Administered 2017-11-28 – 2017-11-29 (×2): via INTRAVENOUS

## 2017-11-28 MED ORDER — TELMISARTAN-HCTZ 40-12.5 MG PO TABS: 1.0000 | ORAL_TABLET | Freq: Every day | ORAL | Status: DC

## 2017-11-28 MED ORDER — SUCCINYLCHOLINE CHLORIDE 200 MG/10ML IV SOSY
PREFILLED_SYRINGE | INTRAVENOUS | Status: DC | PRN
  Administered 2017-11-28: 140 mg via INTRAVENOUS

## 2017-11-28 MED ORDER — CALCIUM-PHOSPHORUS-VITAMIN D 250-107-500 MG-MG-UNIT PO CHEW: CHEWABLE_TABLET | Freq: Every day | ORAL | Status: DC

## 2017-11-28 MED ORDER — EPHEDRINE 5 MG/ML INJ
INTRAVENOUS | Status: AC
  Filled 2017-11-28: qty 10

## 2017-11-28 MED ORDER — 0.9 % SODIUM CHLORIDE (POUR BTL) OPTIME
TOPICAL | Status: DC | PRN
  Administered 2017-11-28: 1000 mL

## 2017-11-28 MED ORDER — CEFAZOLIN SODIUM-DEXTROSE 2-4 GM/100ML-% IV SOLN
2.0000 g | INTRAVENOUS | Status: AC
  Administered 2017-11-28: 2 g via INTRAVENOUS
  Filled 2017-11-28: qty 100

## 2017-11-28 MED ORDER — ACETAMINOPHEN 650 MG RE SUPP: 650.0000 mg | RECTAL | Status: DC | PRN

## 2017-11-28 MED ORDER — DEXAMETHASONE SODIUM PHOSPHATE 10 MG/ML IJ SOLN
INTRAMUSCULAR | Status: DC | PRN
  Administered 2017-11-28: 10 mg via INTRAVENOUS

## 2017-11-28 MED ORDER — HYDROCODONE-ACETAMINOPHEN 5-325 MG PO TABS: 1.0000 | ORAL_TABLET | Freq: Four times a day (QID) | ORAL | 0 refills | Status: AC | PRN

## 2017-11-28 MED ORDER — KETOROLAC TROMETHAMINE 30 MG/ML IJ SOLN: 30.0000 mg | Freq: Once | INTRAMUSCULAR | Status: DC | PRN

## 2017-11-28 MED ORDER — MORPHINE SULFATE (PF) 2 MG/ML IV SOLN: 2.0000 mg | INTRAVENOUS | Status: DC | PRN

## 2017-11-28 SURGICAL SUPPLY — 46 items
ADH SKN CLS APL DERMABOND .7 (GAUZE/BANDAGES/DRESSINGS) ×1
CABLE BIPOLOR RESECTION CORD (MISCELLANEOUS) ×3 IMPLANT
CANISTER SUCT 3000ML PPV (MISCELLANEOUS) ×3 IMPLANT
CLEANER TIP ELECTROSURG 2X2 (MISCELLANEOUS) ×3 IMPLANT
COVER SURGICAL LIGHT HANDLE (MISCELLANEOUS) ×3 IMPLANT
CRADLE DONUT ADULT HEAD (MISCELLANEOUS) ×3 IMPLANT
DERMABOND ADVANCED (GAUZE/BANDAGES/DRESSINGS) ×2
DERMABOND ADVANCED .7 DNX12 (GAUZE/BANDAGES/DRESSINGS) ×1 IMPLANT
DRAIN SNY 10 ROU (WOUND CARE) ×3 IMPLANT
DRAPE SURG 17X23 STRL (DRAPES) ×3 IMPLANT
DRSG TEGADERM 2-3/8X2-3/4 SM (GAUZE/BANDAGES/DRESSINGS) ×15 IMPLANT
ELECT COATED BLADE 2.86 ST (ELECTRODE) ×3 IMPLANT
ELECT PAIRED SUBDERMAL (MISCELLANEOUS) ×3
ELECT REM PT RETURN 9FT ADLT (ELECTROSURGICAL) ×3
ELECTRODE PAIRED SUBDERMAL (MISCELLANEOUS) ×1 IMPLANT
ELECTRODE REM PT RTRN 9FT ADLT (ELECTROSURGICAL) ×1 IMPLANT
GAUZE 4X4 16PLY RFD (DISPOSABLE) ×3 IMPLANT
GAUZE SPONGE 4X4 16PLY XRAY LF (GAUZE/BANDAGES/DRESSINGS) ×3 IMPLANT
GLOVE BIO SURGEON STRL SZ 6.5 (GLOVE) ×2 IMPLANT
GLOVE BIO SURGEONS STRL SZ 6.5 (GLOVE) ×1
GLOVE BIOGEL M 7.0 STRL (GLOVE) ×3 IMPLANT
GLOVE BIOGEL PI IND STRL 7.0 (GLOVE) ×1 IMPLANT
GLOVE BIOGEL PI INDICATOR 7.0 (GLOVE) ×2
GLOVE SURG SS PI 6.5 STRL IVOR (GLOVE) ×6 IMPLANT
GOWN STRL REUS W/ TWL LRG LVL3 (GOWN DISPOSABLE) ×3 IMPLANT
GOWN STRL REUS W/TWL LRG LVL3 (GOWN DISPOSABLE) ×6
HEMOSTAT ARISTA ABSORB 3G PWDR (MISCELLANEOUS) ×3 IMPLANT
KIT BASIN OR (CUSTOM PROCEDURE TRAY) ×3 IMPLANT
KIT TURNOVER KIT B (KITS) ×3 IMPLANT
NS IRRIG 1000ML POUR BTL (IV SOLUTION) ×3 IMPLANT
PAD ARMBOARD 7.5X6 YLW CONV (MISCELLANEOUS) ×6 IMPLANT
PENCIL BUTTON HOLSTER BLD 10FT (ELECTRODE) ×3 IMPLANT
PROBE NERVBE PRASS .33 (MISCELLANEOUS) ×6 IMPLANT
SHEARS HARMONIC 9CM CVD (BLADE) ×3 IMPLANT
SOLUTION BETADINE 4OZ (MISCELLANEOUS) ×3 IMPLANT
SPONGE INTESTINAL PEANUT (DISPOSABLE) ×3 IMPLANT
STAPLER VISISTAT 35W (STAPLE) ×3 IMPLANT
SUT ETHILON 3 0 PS 1 (SUTURE) ×3 IMPLANT
SUT ETHILON 6 0 P 1 (SUTURE) ×6 IMPLANT
SUT SILK 3 0 (SUTURE) ×9
SUT SILK 3 0 REEL (SUTURE) ×3 IMPLANT
SUT SILK 3-0 FS1 18XBRD (SUTURE) ×3 IMPLANT
SUT VIC AB 5-0 PS2 18 (SUTURE) ×3 IMPLANT
SUT VICRYL 4-0 PS2 18IN ABS (SUTURE) ×3 IMPLANT
TOWEL GREEN STERILE FF (TOWEL DISPOSABLE) ×3 IMPLANT
TRAY ENT MC OR (CUSTOM PROCEDURE TRAY) ×3 IMPLANT

## 2017-11-28 NOTE — Anesthesia Preprocedure Evaluation (Addendum)
Anesthesia Evaluation  Patient identified by MRN, date of birth, ID band Patient awake    Reviewed: Allergy & Precautions, NPO status , Patient's Chart, lab work & pertinent test results  History of Anesthesia Complications (+) PONV and history of anesthetic complications  Airway Mallampati: II  TM Distance: >3 FB Neck ROM: Full    Dental no notable dental hx. (+) Teeth Intact   Pulmonary former smoker,    Pulmonary exam normal breath sounds clear to auscultation       Cardiovascular hypertension, Pt. on medications Normal cardiovascular exam Rhythm:Regular Rate:Normal  ECG: NSR, rate 69   Neuro/Psych Syncope negative neurological ROS  negative psych ROS   GI/Hepatic Neg liver ROS, GERD  Controlled,  Endo/Other  negative endocrine ROS  Renal/GU negative Renal ROS     Musculoskeletal   Abdominal (+) + obese,   Peds  Hematology HLD   Anesthesia Other Findings right breast cancer  Reproductive/Obstetrics                             Anesthesia Physical  Anesthesia Plan  ASA: II  Anesthesia Plan: General   Post-op Pain Management:    Induction: Intravenous  PONV Risk Score and Plan: 3 and Ondansetron, Dexamethasone and Treatment may vary due to age or medical condition  Airway Management Planned: Oral ETT  Additional Equipment:   Intra-op Plan:   Post-operative Plan: Extubation in OR  Informed Consent: I have reviewed the patients History and Physical, chart, labs and discussed the procedure including the risks, benefits and alternatives for the proposed anesthesia with the patient or authorized representative who has indicated his/her understanding and acceptance.   Dental advisory given  Plan Discussed with: CRNA and Surgeon  Anesthesia Plan Comments:         Anesthesia Quick Evaluation

## 2017-11-28 NOTE — Anesthesia Postprocedure Evaluation (Signed)
Anesthesia Post Note  Patient: [name]  Procedure(s) Performed: LEFT SUPEFICIAL PAROTIDECTOMY WITH OBSERVATION (Left Neck)     Patient location during evaluation: PACU Anesthesia Type: General Level of consciousness: awake Pain management: pain level controlled Vital Signs Assessment: post-procedure vital signs reviewed and stable Respiratory status: spontaneous breathing Cardiovascular status: stable Postop Assessment: no apparent nausea or vomiting Anesthetic complications: no    Last Vitals:  Vitals:   11/28/17 1112 11/28/17 1143  BP: 123/62 124/71  Pulse:    Resp:  18  Temp:  (!) 36.3 C  SpO2:  93%    Last Pain:  Vitals:   11/28/17 1200  TempSrc:   PainSc: 0-No pain   Pain Goal:                 Jerris Fleer JR,JOHN Clodagh Odenthal

## 2017-11-28 NOTE — Anesthesia Procedure Notes (Signed)
Procedure Name: Intubation Date/Time: 11/28/2017 7:48 AM Performed by: Imagene Riches, CRNA Pre-anesthesia Checklist: Patient identified, Emergency Drugs available, Suction available and Patient being monitored Patient Re-evaluated:Patient Re-evaluated prior to induction Oxygen Delivery Method: Circle System Utilized Preoxygenation: Pre-oxygenation with 100% oxygen Induction Type: IV induction Ventilation: Mask ventilation without difficulty Laryngoscope Size: Miller and 3 Grade View: Grade II Tube type: Oral Tube size: 7.0 mm Number of attempts: 1 Airway Equipment and Method: Stylet and Oral airway Placement Confirmation: ETT inserted through vocal cords under direct vision,  positive ETCO2 and breath sounds checked- equal and bilateral Secured at: 21 cm Tube secured with: Tape Dental Injury: Teeth and Oropharynx as per pre-operative assessment

## 2017-11-28 NOTE — H&P (Signed)
[name] is an 72 y.o. female.   Chief Complaint: Left parotid mass HPI: Prog Left parotid mass  Past Medical History:  Diagnosis Date  . Arthritis   . Cancer ([facility]) 06/2017   right breast cancer  . GERD (gastroesophageal reflux disease)    mild- TUMS used sparingly  . Hyperlipidemia   . Hypertension   . PONV (postoperative nausea and vomiting)    also "passed out with cyst removal from neck"superficial"  . Syncope    x2 episodes-evaluated and findings normal- "usually related to seeing blood or thought of it"  . Vaso vagal episode    easily    Past Surgical History:  Procedure Laterality Date  . BREAST LUMPECTOMY WITH RADIOACTIVE SEED AND SENTINEL LYMPH NODE BIOPSY Right 07/30/2017   Procedure: BREAST LUMPECTOMY WITH RADIOACTIVE SEED AND SENTINEL LYMPH NODE BIOPSY;  Surgeon: [name], MD;  Location: [facility];  Service: General;  Laterality: Right;  . BREAST SURGERY  08/27/11   right lumpectomy w/needle localization  . BUNIONECTOMY Bilateral 2000   bilateral -hardware remains  . CYST REMOVAL NECK     sebaceous  cyst from neck  . DILATION AND CURETTAGE OF UTERUS    . INTRAUTERINE DEVICE INSERTION    . IUD REMOVAL    . LAPAROSCOPIC APPENDECTOMY N/A 03/27/2017   Procedure: APPENDECTOMY LAPAROSCOPIC;  Surgeon: Alphonsa Overall, MD;  Location: [facility];  Service: General;  Laterality: N/A;  . Removal Right Straphenous vein  2011  . TONSILLECTOMY     child  . TOTAL HIP ARTHROPLASTY Right 11/29/2015   Procedure: RIGHT TOTAL HIP ARTHROPLASTY ANTERIOR APPROACH;  Surgeon: Gaynelle Arabian, MD;  Location: [facility];  Service: Orthopedics;  Laterality: Right;    Family History  Problem Relation Age of Onset  . Cancer Mother        benign brain tumor   . Cancer Paternal Aunt        unknown type    Social History:  reports that she quit smoking about 32 years ago. She has a 5.00 pack-year smoking history. She has never used smokeless tobacco. She reports that she drinks  alcohol. She reports that she does not use drugs.  Allergies:  Allergies  Allergen Reactions  . Dermacinrx Surgical Pharmapak Dermatitis    Dermabond caused contact dermatitis     Medications Prior to Admission  Medication Sig Dispense Refill  . anastrozole (ARIMIDEX) 1 MG tablet Take 1 tablet (1 mg total) by mouth daily. 30 tablet 2  . atorvastatin (LIPITOR) 80 MG tablet Take 80 mg by mouth every evening.    . Calcium-Phosphorus-Vitamin D (CITRACAL +D3 PO) Take 1 tablet by mouth daily.    . Cholecalciferol (VITAMIN D3) 2000 units TABS Take 2,000 Units by mouth daily.    Marland Kitchen FINACEA 15 % cream Apply 1 application topically daily. Applied to nose.  0  . LUTEIN-ZEAXANTHIN PO Take 1 tablet by mouth daily.    Marland Kitchen MICARDIS HCT 40-12.5 MG per tablet Take 1 tablet by mouth daily.     . Omega-3 Fatty Acids (FISH OIL) 1200 MG CAPS Take 1,200 mg by mouth daily. Omega-3 360 mg    . ZETIA 10 MG tablet Take 10 mg by mouth every evening.     Marland Kitchen acetaminophen (TYLENOL) 500 MG tablet Take 2 tablets (1,000 mg total) by mouth every 8 (eight) hours. (Patient not taking: Reported on 10/20/2017) 100 tablet 2  . traMADol (ULTRAM) 50 MG tablet Take 1-2 tablets (50-100 mg total) by  mouth every 6 (six) hours as needed for moderate pain (mild pain). (Patient not taking: Reported on 10/20/2017) 50 tablet 0    No results found for this or any previous visit (from the past 48 hour(s)). No results found.  Review of Systems  Constitutional: Negative.   HENT: Negative.   Respiratory: Negative.   Cardiovascular: Negative.     Blood pressure (!) 144/79, pulse 86, temperature 97.9 F (36.6 C), temperature source Oral, resp. rate 18, SpO2 99 %. Physical Exam  Constitutional: She appears well-developed and well-nourished.  Neck: Normal range of motion.  Left parotid mass  Cardiovascular: Normal rate.  Respiratory: Effort normal.  GI: Soft.     Assessment/Plan Adm for Left parotidectomy with NIMS under GA as OP with  o/n obs.  Leatrice Parilla, MD 11/28/2017, 7:32 AM

## 2017-11-28 NOTE — Plan of Care (Signed)

## 2017-11-28 NOTE — Transfer of Care (Signed)
Immediate Anesthesia Transfer of Care Note  Patient: [name]  Procedure(s) Performed: LEFT SUPEFICIAL PAROTIDECTOMY WITH OBSERVATION (Left Neck)  Patient Location: PACU  Anesthesia Type:General  Level of Consciousness: drowsy  Airway & Oxygen Therapy: Patient Spontanous Breathing and Patient connected to nasal cannula oxygen  Post-op Assessment: Report given to RN and Post -op Vital signs reviewed and stable  Post vital signs: Reviewed and stable  Last Vitals:  Vitals Value Taken Time  BP    Temp    Pulse 82 11/28/2017 10:28 AM  Resp    SpO2 91 % 11/28/2017 10:28 AM  Vitals shown include unvalidated device data.  Last Pain:  Vitals:   11/28/17 0636  TempSrc:   PainSc: 0-No pain         Complications: No apparent anesthesia complications

## 2017-11-28 NOTE — Op Note (Signed)
Operative Note: PAROTIDECTOMY  Patient: [name]  Medical record number: [id number]  Date:11/28/2017  Pre-operative Indications: Left Parotid Mass  Postoperative Indications: Same  Surgical Procedure:  Left Superficial Parotidectomy with NIMS Monitoring  Anesthesia: GET  Surgeon: Delsa Bern, M.D.  Assist: Jolene Provost, PA  Complications: None  EBL: 75 cc   Brief History: The patient is a 72 y.o. female with a history of gradually enlarging left parotid mass.  Work-up including CT scan showed a soft tissue mass involving the superior aspect of the left parotid gland with possible cystic component. Given the patient's history and findings I recommended Left Superficial Parotidectomy with nerve monitoring under general anesthesia.  The  risks and benefits were discussed in detail with the patient and their family. They understand and agree with our plan for surgery which is scheduled at [facility] on an elective basis.  Surgical Procedure: The patient is brought to the operating room on 11/28/2017 and placed in supine position on the operating table. General endotracheal anesthesia was established without difficulty. When the patient was adequately anesthetized, surgical timeout was performed and correct identification of the patient and the surgical procedure.  The patient was injected with 6 cc of 1% lidocaine 1-100,000 dilution epinephrine.  The [employer] Nerve Integrity Monitoring System (NIMS) was placed and nerve monitoring was used throughout the facial nerve dissection component of the surgical procedure.  The patient was positioned and prepped and draped in sterile fashion.  With the patient prepped and positioned, left superficial parotidectomy was undertaken.  A curvilinear incision was created in the preauricular skin crease and carried inferiorly around the earlobe and into the upper neck in a pre-existing skin crease using a #15 scalpel.  Subcutaneous soft tissue  was then dissected and incised with Bovie electrocautery to the level of the superficial parotid fascia.  Dissection was carried along the superficial fascia from posterior to anterior elevating skin and muscle layer.  Dissection was then carried along the tragal cartilage from superficial to deep elevating the parotid gland anteriorly.  The inferior aspect of the incision was then dissected carefully, the anterior aspect of the sternocleidomastoid muscle was dissected and the parotid gland was reflected anteriorly.  The main trunk of the facial nerve was then dissected, the superior and inferior divisions of the facial nerve were then followed from proximal to distal identified preserving each of the nerve branches.  The NIMS monitor was used throughout this component of the surgical procedure.  The parotid mass was identified and dissected free from the surrounding normal-appearing parotid tissue using harmonic scalpel and bipolar cautery.  The parotid specimen was then completely resected and sent to pathology for gross microscopic evaluation.  The facial nerve was then stimulated using the NIMS probe and all branches stimulated appropriately at 0.5 mV.  The parotid bed was then irrigated, hemostasis was maintained with bipolar cautery.  Arista static powder was placed in the parotid bed.  A 10 French round drain was then sutured to the skin with 4-0 Ethilon suture and placed in the parotid bed through the parotid skin incision.  The patient's incision was then closed in multiple layers beginning with reapproximation of the periparotid fascia using interrupted 4-0 Vicryl.  The immediate subcutaneous tissue was closed with interrupted 5-0 Vicryl suture.  The final skin closure was obtained with 6-0 Ethilon suture in a running locked fashion.  An orogastric tube was passed and stomach contents were aspirated. Patient was awakened from anesthetic and transferred from the operating  room to the recovery room in  stable condition. There were no complications and blood loss was minimal.   Delsa Bern, M.D. [facility]

## 2017-11-29 ENCOUNTER — Encounter (HOSPITAL_COMMUNITY): Payer: Self-pay | Admitting: Otolaryngology

## 2017-11-29 DIAGNOSIS — D110 Benign neoplasm of parotid gland: Secondary | ICD-10-CM | POA: Diagnosis not present

## 2017-11-29 MED ORDER — DIPHENHYDRAMINE HCL 25 MG PO CAPS
50.0000 mg | ORAL_CAPSULE | Freq: Three times a day (TID) | ORAL | Status: DC | PRN
  Administered 2017-11-29: 50 mg via ORAL
  Filled 2017-11-29: qty 2

## 2017-11-29 NOTE — Progress Notes (Signed)
Discharge home. Home discharge instruction given, no question verbalized. 

## 2017-11-29 NOTE — Progress Notes (Signed)
1 Day Post-Op   Subjective/Chief Complaint: Doing well no complaints   Objective: Vital signs in last 24 hours: Temp:  [97 F (36.1 C)-98.5 F (36.9 C)] 97.3 F (36.3 C) (09/14 0529) Pulse Rate:  [74-112] 74 (09/14 0529) Resp:  [11-18] 18 (09/14 0650) BP: (108-125)/(57-73) 116/66 (09/14 0529) SpO2:  [93 %-99 %] 95 % (09/14 0529)    Intake/Output from previous day: 09/13 0701 - 09/14 0700 In: 1688.9 [P.O.:600; I.V.:1088.9] Out: 75 [Drains:50; Blood:25] Intake/Output this shift: No intake/output data recorded.  awake alert. VII nerve intact. wound excellent. clear. Heart regular. Abdomen soft. Extremities no edema or tenderness  Lab Results:  No results for input(s): WBC, HGB, HCT, PLT in the last 72 hours. BMET No results for input(s): NA, K, CL, CO2, GLUCOSE, BUN, CREATININE, CALCIUM in the last 72 hours. PT/INR No results for input(s): LABPROT, INR in the last 72 hours. ABG No results for input(s): PHART, HCO3 in the last 72 hours.  Invalid input(s): PCO2, PO2  Studies/Results: No results found.  Anti-infectives: Anti-infectives (From admission, onward)   Start     Dose/Rate Route Frequency Ordered Stop   11/28/17 0630  ceFAZolin (ANCEF) IVPB 2g/100 mL premix     2 g 200 mL/hr over 30 Minutes Intravenous On call to O.R. 11/28/17 [id number] 11/28/17 0750      Assessment/Plan: s/p Procedure(s): LEFT SUPEFICIAL PAROTIDECTOMY WITH OBSERVATION (Left) discharged to home. Drain was removed without difficulty. Wound looks excellent. Facial nerve intact. She'll follow-up in one week with Dr. Wilburn Cornelia.  LOS: 0 days    Melissa Montane 11/29/2017

## 2017-12-03 NOTE — Discharge Summary (Signed)
Physician Discharge Summary  Patient ID: [name] MRN: [id number] DOB/AGE: September 07, 1945 72 y.o.  Admit date: 11/28/2017 Discharge date: 12/03/2017  Admission Diagnoses:  Principal Problem:   Parotid mass   Discharge Diagnoses:  Same  Surgeries: Procedure(s): LEFT SUPEFICIAL PAROTIDECTOMY WITH OBSERVATION on 11/28/2017   Consultants: None  Discharged Condition: Improved  Hospital Course: [name] is an 72 y.o. female who was admitted 11/28/2017 with a diagnosis of Principal Problem:   Parotid mass  and went to the operating room on 11/28/2017 and underwent the above named procedures.   Patient stable on postoperative day 1, drain removed.  Patient discharged home in the company of her family with discharge instructions as below.  Recent vital signs:  Vitals:   11/29/17 0529 11/29/17 0650  BP: 116/66   Pulse: 74   Resp: 18 18  Temp: (!) 97.3 F (36.3 C)   SpO2: 95%     Recent laboratory studies:  Results for orders placed or performed during the hospital encounter of 11/18/17  CBC  Result Value Ref Range   WBC 5.6 4.0 - 10.5 K/uL   RBC 4.64 3.87 - 5.11 MIL/uL   Hemoglobin 14.9 12.0 - 15.0 g/dL   HCT 45.8 36.0 - 46.0 %   MCV 98.7 78.0 - 100.0 fL   MCH 32.1 26.0 - 34.0 pg   MCHC 32.5 30.0 - 36.0 g/dL   RDW 12.6 11.5 - 15.5 %   Platelets 205 150 - 400 K/uL  Basic metabolic panel  Result Value Ref Range   Sodium 139 135 - 145 mmol/L   Potassium 4.2 3.5 - 5.1 mmol/L   Chloride 104 98 - 111 mmol/L   CO2 25 22 - 32 mmol/L   Glucose, Bld 98 70 - 99 mg/dL   BUN 19 8 - 23 mg/dL   Creatinine, Ser 0.85 0.44 - 1.00 mg/dL   Calcium 9.9 8.9 - 10.3 mg/dL   GFR calc non Af Amer >60 >60 mL/min   GFR calc Af Amer >60 >60 mL/min   Anion gap 10 5 - 15    Discharge Medications:   Allergies as of 11/29/2017      Reactions   Dermacinrx [facility] [place] Dermatitis   Dermabond caused contact dermatitis       Medication List    TAKE these medications   acetaminophen  500 MG tablet Commonly known as:  TYLENOL Take 2 tablets (1,000 mg total) by mouth every 8 (eight) hours.   anastrozole 1 MG tablet Commonly known as:  ARIMIDEX Take 1 tablet (1 mg total) by mouth daily.   atorvastatin 80 MG tablet Commonly known as:  LIPITOR Take 80 mg by mouth every evening.   CITRACAL +D3 PO Take 1 tablet by mouth daily.   FINACEA 15 % cream Generic drug:  Azelaic Acid Apply 1 application topically daily. Applied to nose.   Fish Oil 1200 MG Caps Take 1,200 mg by mouth daily. Omega-3 360 mg   HYDROcodone-acetaminophen 5-325 MG tablet Commonly known as:  NORCO/VICODIN Take 1-2 tablets by mouth every 6 (six) hours as needed for up to 5 days.   LUTEIN-ZEAXANTHIN PO Take 1 tablet by mouth daily.   MICARDIS HCT 40-12.5 MG tablet Generic drug:  telmisartan-hydrochlorothiazide Take 1 tablet by mouth daily.   traMADol 50 MG tablet Commonly known as:  ULTRAM Take 1-2 tablets (50-100 mg total) by mouth every 6 (six) hours as needed for moderate pain (mild pain).   Vitamin D3 2000 units Tabs Take  2,000 Units by mouth daily.   ZETIA 10 MG tablet Generic drug:  ezetimibe Take 10 mg by mouth every evening.       Diagnostic Studies: No results found.  Disposition:   Discharge Instructions    Call MD for:  difficulty breathing, headache or visual disturbances   Complete by:  As directed    Call MD for:  extreme fatigue   Complete by:  As directed    Call MD for:  hives   Complete by:  As directed    Call MD for:  persistant dizziness or light-headedness   Complete by:  As directed    Call MD for:  persistant nausea and vomiting   Complete by:  As directed    Call MD for:  redness, tenderness, or signs of infection (pain, swelling, redness, odor or green/yellow discharge around incision site)   Complete by:  As directed    Call MD for:  severe uncontrolled pain   Complete by:  As directed    Call MD for:  temperature >100.4   Complete by:  As  directed    Diet - low sodium heart healthy   Complete by:  As directed    Diet - low sodium heart healthy   Complete by:  As directed    Discharge instructions   Complete by:  As directed    1. Limited activity 2. Liquid and soft diet, advance as tolerated 3. May bathe and shower, keep incision dry for 3 days postop 4. Wound care - 1/2 str H2O2 and bacitracin ointment twice daily 5. Elevate Head of Bed 6. Ice compress for 24 hrs  Please contact [facility] [phone]) for any additional concerns.   Increase activity slowly   Complete by:  As directed    Increase activity slowly   Complete by:  As directed       Follow-up Information    Jerrell Belfast, MD In 10 days.   Specialty:  Otolaryngology Contact information: [address] [place] [address] [phone]            Signed: Jerrell Belfast 12/03/2017, 9:32 AM

## 2017-12-13 ENCOUNTER — Other Ambulatory Visit: Payer: Self-pay | Admitting: Hematology

## 2017-12-18 NOTE — Progress Notes (Signed)
[facility]  [phone] [phone]  Clinic Follow up Note   Patient Care Team: [name], MD as PCP - General [name], MD as Consulting Physician (General Surgery) Truitt Merle, MD as Consulting Physician (Hematology) Kyung Rudd, MD as Consulting Physician (Radiation Oncology)   Date of Service:  12/24/2017  CHIEF COMPLAINTS:  F/u for Malignancy neoplasm of lower-inner quadrant of right breast   Oncology History   Cancer Staging Malignant neoplasm of lower-inner quadrant of right breast of female, estrogen receptor positive ([place]) Staging form: Breast, AJCC 8th Edition - Clinical stage from 07/16/2017: Stage IA (cT1b, cN0, cM0, G2, ER+, PR+, HER2-) - Signed by Truitt Merle, MD on 07/23/2017       Malignant neoplasm of lower-inner quadrant of right breast of female, estrogen receptor positive ([place])   07/15/2017 Mammogram    IMPRESSION: The new irregular lesion in the right breast is indeterminate. An ultrasound is recommended.     07/15/2017 Imaging    [place] R Breast and Axilla 07/15/17 IMPRESSION:  The 6 mm mass in the right breast is suspicious for malignancy. A biopsy is recommended.     07/16/2017 Cancer Staging    Staging form: Breast, AJCC 8th Edition - Clinical stage from 07/16/2017: Stage IA (cT1b, cN0, cM0, G2, ER+, PR+, HER2-) - Signed by Truitt Merle, MD on 07/23/2017    07/16/2017 Initial Biopsy    Diagnosis 07/16/17 Breast, right, needle core biopsy, 3 o'clock, 7cm - INVASIVE DUCTAL CARCINOMA, SEE COMMENT. - DUCTAL CARCINOMA IN SITU WITH NECROSIS.    07/16/2017 Receptors her2    Estrogen Receptor: 100%, POSITIVE, STRONG STAINING INTENSITY Progesterone Receptor: 80%, POSITIVE, STRONG STAINING INTENSITY Proliferation Marker Ki67: 10% HER2 - NEGATIVE    07/22/2017 Initial Diagnosis    Malignant neoplasm of lower-inner quadrant of right breast of female, estrogen receptor positive ([place])    07/30/2017 Surgery    BREAST LUMPECTOMY WITH  RADIOACTIVE SEED AND SENTINEL LYMPH NODE BIOPSY by Dr. Barry Dienes  07/30/17     07/30/2017 Pathology Results    Diagnosis 07/30/17  1. Breast, lumpectomy, right - INVASIVE DUCTAL CARCINOMA, GRADE 2, SPANNING 0.6 CM. - INTERMEDIATE GRADE DUCTAL CARCINOMA IN SITU. - FINAL RESECTION MARGINS ARE NEGATIVE. - BIOPSY SITE. - SEE ONCOLOGY TABLE. 2. Breast, excision, right additional lateral margin - FIBROCYSTIC CHANGE. - NO MALIGNANCY IDENTIFIED. 3. Lymph node, sentinel, biopsy, right axillary #1 - ONE OF ONE LYMPH NODES NEGATIVE FOR CARCINOMA (0/1). 4. Lymph node, sentinel, biopsy, right axillary #2 - ONE OF ONE LYMPH NODES NEGATIVE FOR CARCINOMA (0/1). 5. Lymph node, sentinel, biopsy, right axillary #3 - ONE OF ONE LYMPH NODES NEGATIVE FOR CARCINOMA (0/1). 6. Lymph node, sentinel, biopsy, right axillary #4 - ONE OF ONE LYMPH NODES NEGATIVE FOR CARCINOMA (0/1).    07/30/2017 Oncotype testing    Oncotype 07/30/17 Recurrence score 20 with a 6% risk of distant recurrence on Tamoxifen alone and a <1% benefit of chemotherapy     09/01/2017 - 09/29/2017 Radiation Therapy    Radiation with Dr. Lisbeth Renshaw  09/01/17 and  completed on 09/29/17.      HISTORY OF PRESENTING ILLNESS:  [name] 72 y.o. female is a here because of newly diagnosed breast cancer. The patient was referred by her PCP. The patient presents to the clinic today accompanied by her husband.  Prior to pt's abnormal mammogram, she states did not feel any lump. She states she was compliant with yearly screening because she had an abnormal mammogram with a benign  papilloma that was removed. She states she feels well overall, not noticing any weight loss, back pain or extensive joint pain.   Pt's diagnotic mammogram from 07/15/17 warranted further evaluation. Her ultrasound from the same day revealed a 6 mm mass in the right breast suspicious for malignancy. Her initial biopsy confirmed right breast invasive carcinoma.   Estrogen Receptor:  100%, POSITIVE, STRONG STAINING INTENSITY Progesterone Receptor: 80%, POSITIVE, STRONG STAINING INTENSITY Proliferation Marker Ki67: 10% HER2 - NEGATIVE  She has a medical history of HTN and GERD. She has a surgical history of appendectomy and lumpectomy.   GYN HISTORY  Menarchal: 12 LMP: 6458, at 72 years old  HRT: None G3P2: first birth at age 88   She had a FMHx of CA by her paternal aunt who had cancer and died at a young age. Her mother had a benign brain tumor.  She denies tobacco use recently and she drinks alcohol socially. Socially, she is married, retired and lives at home.    CURRENT THERAPY  Anastrozole started 11/2017   INTERVAL HISTORY [name] is here for a follow up.  She was noted to have a left parotid mass and had a parotidectomy on 11/28/2017 with Dr. Wilburn Cornelia. Today, she is here alone. She is doing well and healing well from surgery. She is tolerating Anastrozole well. No significant joint pain, hot flashes or mood swings. She has a history of a trigger finger that hurts sometimes, but his was there before Anastrozole.    MEDICAL HISTORY:  Past Medical History:  Diagnosis Date  . Arthritis   . Cancer ([place]) 06/2017   right breast cancer  . GERD (gastroesophageal reflux disease)    mild- TUMS used sparingly  . Hyperlipidemia   . Hypertension   . PONV (postoperative nausea and vomiting)    also "passed out with cyst removal from neck"superficial"  . Syncope    x2 episodes-evaluated and findings normal- "usually related to seeing blood or thought of it"  . Vaso vagal episode    easily    SURGICAL HISTORY: Past Surgical History:  Procedure Laterality Date  . APPENDECTOMY    . BREAST LUMPECTOMY WITH RADIOACTIVE SEED AND SENTINEL LYMPH NODE BIOPSY Right 07/30/2017   Procedure: BREAST LUMPECTOMY WITH RADIOACTIVE SEED AND SENTINEL LYMPH NODE BIOPSY;  Surgeon: [name], MD;  Location: [facility];  Service: General;  Laterality: Right;   . BREAST SURGERY  08/27/11   right lumpectomy w/needle localization  . BUNIONECTOMY Bilateral 2000   bilateral -hardware remains  . CYST REMOVAL NECK     sebaceous  cyst from neck  . DILATION AND CURETTAGE OF UTERUS    . INTRAUTERINE DEVICE INSERTION    . IUD REMOVAL    . LAPAROSCOPIC APPENDECTOMY N/A 03/27/2017   Procedure: APPENDECTOMY LAPAROSCOPIC;  Surgeon: Alphonsa Overall, MD;  Location: [facility];  Service: General;  Laterality: N/A;  . PAROTIDECTOMY Left 11/28/2017   SUPERFICIAL  . PAROTIDECTOMY Left 11/28/2017   Procedure: LEFT SUPEFICIAL PAROTIDECTOMY WITH OBSERVATION;  Surgeon: Jerrell Belfast, MD;  Location: [facility];  Service: ENT;  Laterality: Left;  . Removal Right Straphenous vein  2011  . TONSILLECTOMY     child  . TOTAL HIP ARTHROPLASTY Right 11/29/2015   Procedure: RIGHT TOTAL HIP ARTHROPLASTY ANTERIOR APPROACH;  Surgeon: Gaynelle Arabian, MD;  Location: [facility];  Service: Orthopedics;  Laterality: Right;    SOCIAL HISTORY: Social History   Socioeconomic History  . Marital status: Married    Spouse name:  Not on file  . Number of children: Not on file  . Years of education: Not on file  . Highest education level: Not on file  Occupational History  . Not on file  Social Needs  . Financial resource strain: Not on file  . Food insecurity:    Worry: Not on file    Inability: Not on file  . Transportation needs:    Medical: Not on file    Non-medical: Not on file  Tobacco Use  . Smoking status: Former Smoker    Packs/day: 0.50    Years: 10.00    Pack years: 5.00    Last attempt to quit: 11/21/1985    Years since quitting: 32.1  . Smokeless tobacco: Never Used  Substance and Sexual Activity  . Alcohol use: Yes    Comment: Socially  . Drug use: No  . Sexual activity: Yes  Lifestyle  . Physical activity:    Days per week: Not on file    Minutes per session: Not on file  . Stress: Not on file  Relationships  . Social connections:    Talks on phone: Not on file     Gets together: Not on file    Attends religious service: Not on file    Active member of club or organization: Not on file    Attends meetings of clubs or organizations: Not on file    Relationship status: Not on file  . Intimate partner violence:    Fear of current or ex partner: Not on file    Emotionally abused: Not on file    Physically abused: Not on file    Forced sexual activity: Not on file  Other Topics Concern  . Not on file  Social History Narrative  . Not on file    FAMILY HISTORY: Family History  Problem Relation Age of Onset  . Cancer Mother        benign brain tumor   . Cancer Paternal Aunt        unknown type     ALLERGIES:  is allergic to dermacinrx surgical pharmapak.  MEDICATIONS:  Current Outpatient Medications  Medication Sig Dispense Refill  . acetaminophen (TYLENOL) 500 MG tablet Take 2 tablets (1,000 mg total) by mouth every 8 (eight) hours. 100 tablet 2  . anastrozole (ARIMIDEX) 1 MG tablet TAKE 1 TABLET(1 MG) BY MOUTH DAILY 90 tablet 3  . atorvastatin (LIPITOR) 80 MG tablet Take 80 mg by mouth every evening.    . Calcium-Phosphorus-Vitamin D (CITRACAL +D3 PO) Take 1 tablet by mouth daily.    . Cholecalciferol (VITAMIN D3) 2000 units TABS Take 2,000 Units by mouth daily.    Marland Kitchen FINACEA 15 % cream Apply 1 application topically daily. Applied to nose.  0  . LUTEIN-ZEAXANTHIN PO Take 1 tablet by mouth daily.    Marland Kitchen MICARDIS HCT 40-12.5 MG per tablet Take 1 tablet by mouth daily.     . Omega-3 Fatty Acids (FISH OIL) 1200 MG CAPS Take 1,200 mg by mouth daily. Omega-3 360 mg    . ZETIA 10 MG tablet Take 10 mg by mouth every evening.      No current facility-administered medications for this visit.     REVIEW OF SYSTEMS:   Constitutional: Denies fevers, chills or abnormal night sweats  Eyes: Denies blurriness of vision, double vision or watery eyes Ears, nose, mouth, throat, and face: Denies mucositis or sore throat (+) recent parotidectomy  Respiratory:  Denies cough, dyspnea or wheezes  Cardiovascular: Denies palpitation, chest discomfort or lower extremity swelling Gastrointestinal:  Denies nausea, heartburn or change in bowel habits Skin: Denies abnormal skin rashes Lymphatics: Denies new lymphadenopathy or easy bruising Neurological:Denies numbness, tingling or new weaknesses Behavioral/Psych: Mood is stable, no new changes  All other systems were reviewed with the patient and are negative.  PHYSICAL EXAMINATION: ECOG PERFORMANCE STATUS: 0 - Asymptomatic  Vitals:   12/24/17 1332  BP: (!) 149/87  Pulse: 87  Resp: 18  Temp: 97.6 F (36.4 C)  SpO2: 100%   Filed Weights   12/24/17 1332  Weight: 183 lb 1.6 oz (83.1 kg)    GENERAL:alert, no distress and comfortable SKIN: skin color, texture, turgor are normal, no rashes or significant lesions EYES: normal, conjunctiva are pink and non-injected, sclera clear OROPHARYNX:no exudate, no erythema and lips, buccal mucosa, and tongue normal (+) recent parotidectomy  NECK: supple, thyroid normal size, non-tender, without nodularity LYMPH:  no palpable lymphadenopathy in the cervical, axillary or inguinal LUNGS: clear to auscultation and percussion with normal breathing effort HEART: regular rate & rhythm and no murmurs and no lower extremity edema ABDOMEN:abdomen soft, non-tender and normal bowel sounds Musculoskeletal:no cyanosis of digits and no clubbing  PSYCH: alert & oriented x 3 with fluent speech NEURO: no focal motor/sensory deficits Breasts: Breast inspection showed them to be symmetrical with no nipple discharge. S/p Right breast lumpectomy: Her surgical incision is healed well. She mild diffuse skin hyperpigmentation from radiation with no skin breakdown or ulcers. (+) right breast lymphedema and skin tightness   LABORATORY DATA:  I have reviewed the data as listed CBC Latest Ref Rng & Units 12/24/2017 11/18/2017 07/23/2017  WBC 4.0 - 10.5 K/uL 6.5 5.6 6.3  Hemoglobin 12.0 -  15.0 g/dL 14.3 14.9 14.5  Hematocrit 36.0 - 46.0 % 42.4 45.8 42.8  Platelets 150 - 400 K/uL 204 205 202   CMP Latest Ref Rng & Units 12/24/2017 11/18/2017 07/23/2017  Glucose 70 - 99 mg/dL 90 98 95  BUN 8 - 23 mg/dL _0 Creatinine 0.44 - 1.00 mg/dL 0.82 0.85 0.86  Sodium 135 - 145 mmol/L 140 139 141  Potassium 3.5 - 5.1 mmol/L 3.7 4.2 4.1  Chloride 98 - 111 mmol/L 103 104 105  CO2 22 - 32 mmol/L _1 Calcium 8.9 - 10.3 mg/dL 9.9 9.9 10.0  Total Protein 6.5 - 8.1 g/dL 7.7 - 7.5  Total Bilirubin 0.3 - 1.2 mg/dL 1.2 - 0.7  Alkaline Phos 38 - 126 U/L 69 - 74  AST 15 - 41 U/L 22 - 20  ALT 0 - 44 U/L 18 - 21   PATHOLOGY 07/30/2017 Molecular Pathology    Diagnosis 07/30/17  1. Breast, lumpectomy, right - INVASIVE DUCTAL CARCINOMA, GRADE 2, SPANNING 0.6 CM. - INTERMEDIATE GRADE DUCTAL CARCINOMA IN SITU. - FINAL RESECTION MARGINS ARE NEGATIVE. - BIOPSY SITE. - SEE ONCOLOGY TABLE. 2. Breast, excision, right additional lateral margin - FIBROCYSTIC CHANGE. - NO MALIGNANCY IDENTIFIED. 3. Lymph node, sentinel, biopsy, right axillary #1 - ONE OF ONE LYMPH NODES NEGATIVE FOR CARCINOMA (0/1). 4. Lymph node, sentinel, biopsy, right axillary #2 - ONE OF ONE LYMPH NODES NEGATIVE FOR CARCINOMA (0/1). 5. Lymph node, sentinel, biopsy, right axillary #3 - ONE OF ONE LYMPH NODES NEGATIVE FOR CARCINOMA (0/1). 6. Lymph node, sentinel, biopsy, right axillary #4 - ONE OF ONE LYMPH NODES NEGATIVE FOR CARCINOMA (0/1). Microscopic Comment 1. INVASIVE CARCINOMA OF THE BREAST: Resection Procedure: Right breast lumpectomy and additional lateral margin.  Right axillary sentinel lymph node biopsies. Specimen Laterality: Right. Tumor Size: 0.6 cm. Histologic Type: Invasive ductal carcinoma. Histologic Grade: Glandular (Acinar)/Tubular Differentiation: 2 Nuclear Pleomorphism: 3 Mitotic Rate: 1 Overall Grade: 2 Ductal Carcinoma In Situ: Present, intermediate grade. 1 of 3 FINAL for [name], [name]  [id number]) Microscopic Comment(continued) Tumor Extension: Confined to breast parenchyma. Margins: Distance from closest margin (millimeters): >1 cm final margins (part #2). Original lateral margin <1 mm broadly. Specify closest margin (required only if <56m): N/A. DCIS Margins Distance from closest margin (millimeters): >1 cm final margins (part #2). Specify closest margin (required only if <130m: N/A. Regional Lymph Nodes: Number of Lymph Nodes Examined: 4 Number of Sentinel Nodes Examined (if applicable): 4 Number of Lymph Nodes with Macrometastases (>2 mm): 0 Number of Lymph Nodes with Micrometastases: 0 Number of Lymph Nodes with Isolated Tumor Cells (0.2 mm or 200 cells)#: 0 Size of Largest Metastatic Deposit (millimeters): N/A. Extranodal Extension N/A. Treatment Effect: No known presurgical therapy Breast Biomarker Testing Performed on Previous Biopsy: Testing Performed on Case Number: [id number] Receptor: Positive, 100% strong staining. Progesterone Receptor: Positive, 80% strong staining. HER2: Negative. Representative tumor block: 1B-C Pathologic Stage Classification (pTNM, AJCC 8th Edition): pT1b, pN0 (v4.2.0.0)    Diagnosis 07/16/17 Breast, right, needle core biopsy, 3 o'clock, 7cm - INVASIVE DUCTAL CARCINOMA, SEE COMMENT. - DUCTAL CARCINOMA IN SITU WITH NECROSIS. Microscopic Comment The carcinoma appears grade 2. Prognostic markers will be ordered. Dr. KiLyndon Codeas reviewed the case. The case was called to [facility] 07/17/2017. Results: Estrogen Receptor: 100%, POSITIVE, STRONG STAINING INTENSITY Progesterone Receptor: 80%, POSITIVE, STRONG STAINING INTENSITY Proliferation Marker Ki67: 10% HER2 - NEGATIVE     RADIOGRAPHIC STUDIES: I have personally reviewed the radiological images as listed and agreed with the findings in the report. No results found.   [place] Breast and Axilla 07/15/17 IMPRESSION:  The 6 mm mass in the right breast  is suspicious for malignancy. A biopsy is recommended.   Diagnostic Mammogram 07/15/17 IMPRESSION: The new irregular lesion in the right breast is indeterminate. An ultrasound is recommended.   ASSESSMENT & PLAN:  [name] [age]ear old post-menopausal woman, presented with screening discovered right breast cancer.   1. 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 -Given her early stage disease she was a candidate for breast conservation and she underwent right breast lumpectomy and SLNB by Dr. ByBarry Dienesn 07/30/17.  -I reviewed her surgical pathology which showed complete resection with negative margins, node negative.  -Her Oncotype score was 20 with 9-year distant recurrence risk of 6% with tamoxifen or AI. I do not recommend adjuvant chemotherapy.  -She proceeded with radiation to reduce her risk of local recurrence by Dr. MoLisbeth Renshaw/17/19 and plans to complete on 09/29/17.  -she has started adjuvant anastrozole, she is tolerating well, will continue for 5 to 7 years. -She recently had a pelvic fracture without injury and her recent bone density scan showed osteopenia, I recommended her to consider biphosphonate, especially Zometa every 6 months for 2 years, which is approved as adjuvant therapy for early stage breast cancer, which also slightly decrease risk of bone metastasis.  Potential benefit and side effects discussed with her.  She will think about it. She has not decided, will discuss with her PCP -I reviewed her family history of cancer which makes  her eligible for genetic tested. She declined genetic testing, she has no daughters.  -Labs reviewed, Cbc is WNLs. CMP is pending.  -She will attend survivorship clinic in 2 months -F/u in 5 months. will order mammogram next visit    2. Bone  Health  -She notes having a fracture in early 2019 with little physical cause which is a concern for bone weakness and fracture risk.  -Pt notes her previous DEXA from early 2019 with Dr. Coral Spikes showed borderline osteopenia. I discussed Anastrozole may further lower her bone density. I will request scan results.  -She is currently on daily calcium supplement  -I discussed the option of IV Zometa to strengthen her bone and reduce risk of cancer recurrence in the bone. I provided her with reading material on this medication. She will discuss this with her PCP and think about this.  -I advised her to talk to her PCP about her bone health.   3. Left Parotid gland mass, benign  -She had parotidectomy with Dr. Wilburn Cornelia recently. She is recovering well.  -path reviewed, benign    4. HTN -Continue medication and follow-up with PCP   PLAN:  -She will attend survivorship clinic in 2 months -F/u in 5 months. will order mammogram next visit  -Refilled Anastrozole today   No orders of the defined types were placed in this encounter.  All questions were answered. The patient knows to call the clinic with any problems, questions or concerns.  I spent 20 minutes counseling the patient face to face. The total time spent in the appointment was 25 minutes and more than 50% was on counseling.  Dierdre Searles Dweik am acting as scribe for Dr. Truitt Merle.  I have reviewed the above documentation for accuracy and completeness, and I agree with the above.       Truitt Merle, MD 12/24/2017

## 2017-12-23 ENCOUNTER — Other Ambulatory Visit: Payer: Self-pay | Admitting: *Deleted

## 2017-12-23 DIAGNOSIS — C50311 Malignant neoplasm of lower-inner quadrant of right female breast: Secondary | ICD-10-CM

## 2017-12-23 DIAGNOSIS — Z170 Estrogen receptor positive status [ER+]: Principal | ICD-10-CM

## 2017-12-24 ENCOUNTER — Telehealth: Payer: Self-pay | Admitting: Hematology

## 2017-12-24 ENCOUNTER — Inpatient Hospital Stay (HOSPITAL_BASED_OUTPATIENT_CLINIC_OR_DEPARTMENT_OTHER): Payer: Medicare Other | Admitting: Hematology

## 2017-12-24 ENCOUNTER — Inpatient Hospital Stay: Payer: Medicare Other | Attending: Hematology

## 2017-12-24 ENCOUNTER — Encounter: Payer: Self-pay | Admitting: Hematology

## 2017-12-24 VITALS — BP 149/87 | HR 87 | Temp 97.6°F | Resp 18 | Ht 65.0 in | Wt 183.1 lb

## 2017-12-24 DIAGNOSIS — Z79811 Long term (current) use of aromatase inhibitors: Secondary | ICD-10-CM

## 2017-12-24 DIAGNOSIS — Z87891 Personal history of nicotine dependence: Secondary | ICD-10-CM | POA: Diagnosis not present

## 2017-12-24 DIAGNOSIS — I10 Essential (primary) hypertension: Secondary | ICD-10-CM

## 2017-12-24 DIAGNOSIS — Z170 Estrogen receptor positive status [ER+]: Secondary | ICD-10-CM

## 2017-12-24 DIAGNOSIS — C50311 Malignant neoplasm of lower-inner quadrant of right female breast: Secondary | ICD-10-CM

## 2017-12-24 DIAGNOSIS — Z79899 Other long term (current) drug therapy: Secondary | ICD-10-CM

## 2017-12-24 DIAGNOSIS — Z923 Personal history of irradiation: Secondary | ICD-10-CM | POA: Insufficient documentation

## 2017-12-24 DIAGNOSIS — K219 Gastro-esophageal reflux disease without esophagitis: Secondary | ICD-10-CM | POA: Diagnosis not present

## 2017-12-24 LAB — CBC WITH DIFFERENTIAL (CANCER CENTER ONLY)
Abs Immature Granulocytes: 0.02 10*3/uL (ref 0.00–0.07)
BASOS ABS: 0.1 10*3/uL (ref 0.0–0.1)
BASOS PCT: 1 %
EOS PCT: 2 %
Eosinophils Absolute: 0.1 10*3/uL (ref 0.0–0.5)
HCT: 42.4 % (ref 36.0–46.0)
Hemoglobin: 14.3 g/dL (ref 12.0–15.0)
Immature Granulocytes: 0 %
Lymphocytes Relative: 23 %
Lymphs Abs: 1.5 10*3/uL (ref 0.7–4.0)
MCH: 32.1 pg (ref 26.0–34.0)
MCHC: 33.7 g/dL (ref 30.0–36.0)
MCV: 95.1 fL (ref 80.0–100.0)
MONO ABS: 0.5 10*3/uL (ref 0.1–1.0)
Monocytes Relative: 8 %
NRBC: 0 % (ref 0.0–0.2)
Neutro Abs: 4.3 10*3/uL (ref 1.7–7.7)
Neutrophils Relative %: 66 %
PLATELETS: 204 10*3/uL (ref 150–400)
RBC: 4.46 MIL/uL (ref 3.87–5.11)
RDW: 12.5 % (ref 11.5–15.5)
WBC: 6.5 10*3/uL (ref 4.0–10.5)

## 2017-12-24 LAB — CMP (CANCER CENTER ONLY)
ALT: 18 U/L (ref 0–44)
AST: 22 U/L (ref 15–41)
Albumin: 4.4 g/dL (ref 3.5–5.0)
Alkaline Phosphatase: 69 U/L (ref 38–126)
Anion gap: 12 (ref 5–15)
BILIRUBIN TOTAL: 1.2 mg/dL (ref 0.3–1.2)
BUN: 14 mg/dL (ref 8–23)
CO2: 25 mmol/L (ref 22–32)
CREATININE: 0.82 mg/dL (ref 0.44–1.00)
Calcium: 9.9 mg/dL (ref 8.9–10.3)
Chloride: 103 mmol/L (ref 98–111)
Glucose, Bld: 90 mg/dL (ref 70–99)
POTASSIUM: 3.7 mmol/L (ref 3.5–5.1)
Sodium: 140 mmol/L (ref 135–145)
TOTAL PROTEIN: 7.7 g/dL (ref 6.5–8.1)

## 2017-12-24 MED ORDER — ANASTROZOLE 1 MG PO TABS: ORAL_TABLET | ORAL | 3 refills | Status: DC

## 2017-12-24 NOTE — Telephone Encounter (Signed)
Appts scheduled letter/calendar mailed per 10/9 los

## 2018-02-25 ENCOUNTER — Telehealth: Payer: Self-pay

## 2018-02-25 NOTE — Telephone Encounter (Signed)
Spoke with patient to remind of SCP visit with NP on 03/02/18 at 2 pm.  Patient said she will come to appt.

## 2018-03-02 ENCOUNTER — Encounter: Payer: Self-pay | Admitting: Adult Health

## 2018-03-02 ENCOUNTER — Inpatient Hospital Stay: Payer: Medicare Other | Attending: Hematology | Admitting: Adult Health

## 2018-03-02 VITALS — BP 103/85 | HR 90 | Temp 98.2°F | Resp 18 | Ht 65.0 in | Wt 185.9 lb

## 2018-03-02 DIAGNOSIS — Z170 Estrogen receptor positive status [ER+]: Secondary | ICD-10-CM | POA: Insufficient documentation

## 2018-03-02 DIAGNOSIS — Z923 Personal history of irradiation: Secondary | ICD-10-CM

## 2018-03-02 DIAGNOSIS — I10 Essential (primary) hypertension: Secondary | ICD-10-CM | POA: Diagnosis not present

## 2018-03-02 DIAGNOSIS — Z79899 Other long term (current) drug therapy: Secondary | ICD-10-CM | POA: Insufficient documentation

## 2018-03-02 DIAGNOSIS — C50311 Malignant neoplasm of lower-inner quadrant of right female breast: Secondary | ICD-10-CM | POA: Insufficient documentation

## 2018-03-02 DIAGNOSIS — Z87891 Personal history of nicotine dependence: Secondary | ICD-10-CM | POA: Insufficient documentation

## 2018-03-02 DIAGNOSIS — Z79811 Long term (current) use of aromatase inhibitors: Secondary | ICD-10-CM

## 2018-03-02 NOTE — Progress Notes (Signed)
CLINIC:  Survivorship   REASON FOR VISIT:  Routine follow-up post-treatment for a recent history of breast cancer.  BRIEF ONCOLOGIC HISTORY:  Oncology History   Cancer Staging Malignant neoplasm of lower-inner quadrant of right breast of female, estrogen receptor positive ([place]) Staging form: Breast, AJCC 8th Edition - Clinical stage from 07/16/2017: Stage IA (cT1b, cN0, cM0, G2, ER+, PR+, HER2-) - Signed by Truitt Merle, MD on 07/23/2017       Malignant neoplasm of lower-inner quadrant of right breast of female, estrogen receptor positive ([place])   07/15/2017 Mammogram    IMPRESSION: The new irregular lesion in the right breast is indeterminate. An ultrasound is recommended.     07/15/2017 Imaging    [place] R Breast and Axilla 07/15/17 IMPRESSION:  The 6 mm mass in the right breast is suspicious for malignancy. A biopsy is recommended.     07/16/2017 Cancer Staging    Staging form: Breast, AJCC 8th Edition - Clinical stage from 07/16/2017: Stage IA (cT1b, cN0, cM0, G2, ER+, PR+, HER2-) - Signed by Truitt Merle, MD on 07/23/2017    07/16/2017 Initial Biopsy    Diagnosis 07/16/17 Breast, right, needle core biopsy, 3 o'clock, 7cm - INVASIVE DUCTAL CARCINOMA, SEE COMMENT. - DUCTAL CARCINOMA IN SITU WITH NECROSIS.    07/16/2017 Receptors her2    Estrogen Receptor: 100%, POSITIVE, STRONG STAINING INTENSITY Progesterone Receptor: 80%, POSITIVE, STRONG STAINING INTENSITY Proliferation Marker Ki67: 10% HER2 - NEGATIVE    07/22/2017 Initial Diagnosis    Malignant neoplasm of lower-inner quadrant of right breast of female, estrogen receptor positive ([place])    07/30/2017 Surgery    BREAST LUMPECTOMY WITH RADIOACTIVE SEED AND SENTINEL LYMPH NODE BIOPSY by Dr. Barry Dienes  07/30/17     07/30/2017 Pathology Results    Diagnosis 07/30/17  1. Breast, lumpectomy, right - INVASIVE DUCTAL CARCINOMA, GRADE 2, SPANNING 0.6 CM. - INTERMEDIATE GRADE DUCTAL CARCINOMA IN SITU. - FINAL RESECTION MARGINS ARE NEGATIVE. - BIOPSY  SITE. - SEE ONCOLOGY TABLE. 2. Breast, excision, right additional lateral margin - FIBROCYSTIC CHANGE. - NO MALIGNANCY IDENTIFIED. 3. Lymph node, sentinel, biopsy, right axillary #1 - ONE OF ONE LYMPH NODES NEGATIVE FOR CARCINOMA (0/1). 4. Lymph node, sentinel, biopsy, right axillary #2 - ONE OF ONE LYMPH NODES NEGATIVE FOR CARCINOMA (0/1). 5. Lymph node, sentinel, biopsy, right axillary #3 - ONE OF ONE LYMPH NODES NEGATIVE FOR CARCINOMA (0/1). 6. Lymph node, sentinel, biopsy, right axillary #4 - ONE OF ONE LYMPH NODES NEGATIVE FOR CARCINOMA (0/1).    07/30/2017 Oncotype testing    Oncotype 07/30/17 Recurrence score 20 with a 6% risk of distant recurrence on Tamoxifen alone and a <1% benefit of chemotherapy     09/01/2017 - 09/29/2017 Radiation Therapy    Radiation with Dr. Lisbeth Renshaw  09/01/17 and  completed on 09/29/17.     10/2017 -  Anti-estrogen oral therapy    Anastrozole daily     INTERVAL HISTORY:  [name] presents to the [facility] today for our initial meeting to review her survivorship care plan detailing her treatment course for breast cancer, as well as monitoring long-term side effects of that treatment, education regarding health maintenance, screening, and overall wellness and health promotion.     Overall, [name] reports feeling quite well.  She has been taking the anastrozole since 10/2017.  She is tolerating it well.  She has noted itching x 2 months.  No rash.  She wonders if it is dry skin.  She had labs drawn in 12/2017  that were normal.  She denies vaginal dryness, arthraligas and hot flashes.  She underwent bone density testing one year ago.  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  She had this done with Dr. Laurann Montana, her PCP.      REVIEW OF SYSTEMS:  Review of Systems  Constitutional: Negative for appetite change, chills, fatigue, fever and unexpected weight change.  HENT:   Negative for hearing loss, lump/mass, sore throat and trouble  swallowing.   Eyes: Negative for eye problems and icterus.  Respiratory: Negative for chest tightness, cough and shortness of breath.   Cardiovascular: Negative for chest pain, leg swelling and palpitations.  Gastrointestinal: Negative for abdominal distention, abdominal pain, constipation, diarrhea, nausea and vomiting.  Endocrine: Negative for hot flashes.  Musculoskeletal: Negative for arthralgias.  Skin: Negative for itching and rash.  Neurological: Negative for dizziness, extremity weakness, headaches and numbness.  Hematological: Negative for adenopathy.  Psychiatric/Behavioral: Negative for depression. The patient is not nervous/anxious.   Breast: Denies any new nodularity, masses, tenderness, nipple changes, or nipple discharge.      ONCOLOGY TREATMENT TEAM:  1. Surgeon:  Dr. Barry Dienes at [facility] 2. Medical Oncologist: Dr. Burr Medico  3. Radiation Oncologist: Dr. Lisbeth Renshaw    PAST MEDICAL/SURGICAL HISTORY:  Past Medical History:  Diagnosis Date  . Arthritis   . Cancer ([place]) 06/2017   right breast cancer  . GERD (gastroesophageal reflux disease)    mild- TUMS used sparingly  . Hyperlipidemia   . Hypertension   . PONV (postoperative nausea and vomiting)    also "passed out with cyst removal from neck"superficial"  . Syncope    x2 episodes-evaluated and findings normal- "usually related to seeing blood or thought of it"  . Vaso vagal episode    easily   Past Surgical History:  Procedure Laterality Date  . APPENDECTOMY    . BREAST LUMPECTOMY WITH RADIOACTIVE SEED AND SENTINEL LYMPH NODE BIOPSY Right 07/30/2017   Procedure: BREAST LUMPECTOMY WITH RADIOACTIVE SEED AND SENTINEL LYMPH NODE BIOPSY;  Surgeon: [name], MD;  Location: [facility];  Service: General;  Laterality: Right;  . BREAST SURGERY  08/27/11   right lumpectomy w/needle localization  . BUNIONECTOMY Bilateral 2000   bilateral -hardware remains  . CYST REMOVAL NECK     sebaceous   cyst from neck  . DILATION AND CURETTAGE OF UTERUS    . INTRAUTERINE DEVICE INSERTION    . IUD REMOVAL    . LAPAROSCOPIC APPENDECTOMY N/A 03/27/2017   Procedure: APPENDECTOMY LAPAROSCOPIC;  Surgeon: Alphonsa Overall, MD;  Location: [facility];  Service: General;  Laterality: N/A;  . PAROTIDECTOMY Left 11/28/2017   SUPERFICIAL  . PAROTIDECTOMY Left 11/28/2017   Procedure: LEFT SUPEFICIAL PAROTIDECTOMY WITH OBSERVATION;  Surgeon: Jerrell Belfast, MD;  Location: [facility];  Service: ENT;  Laterality: Left;  . Removal Right Straphenous vein  2011  . TONSILLECTOMY     child  . TOTAL HIP ARTHROPLASTY Right 11/29/2015   Procedure: RIGHT TOTAL HIP ARTHROPLASTY ANTERIOR APPROACH;  Surgeon: Gaynelle Arabian, MD;  Location: [facility];  Service: Orthopedics;  Laterality: Right;     ALLERGIES:  Allergies  Allergen Reactions  . Dermacinrx Surgical [place] Dermatitis    Dermabond caused contact dermatitis      CURRENT MEDICATIONS:  Outpatient Encounter Medications as of 03/02/2018  Medication Sig  . acetaminophen (TYLENOL) 500 MG tablet Take 2 tablets (1,000 mg total) by mouth every 8 (eight) hours.  Marland Kitchen anastrozole (ARIMIDEX) 1 MG tablet TAKE 1  TABLET(1 MG) BY MOUTH DAILY  . atorvastatin (LIPITOR) 80 MG tablet Take 80 mg by mouth every evening.  . Calcium-Phosphorus-Vitamin D (CITRACAL +D3 PO) Take 1 tablet by mouth daily.  . Cholecalciferol (VITAMIN D3) 2000 units TABS Take 2,000 Units by mouth daily.  Marland Kitchen FINACEA 15 % cream Apply 1 application topically daily. Applied to nose.  . LUTEIN-ZEAXANTHIN PO Take 1 tablet by mouth daily.  Marland Kitchen MICARDIS HCT 40-12.5 MG per tablet Take 1 tablet by mouth daily.   Marland Kitchen ZETIA 10 MG tablet Take 10 mg by mouth every evening.   . Omega-3 Fatty Acids (FISH OIL) 1200 MG CAPS Take 1,200 mg by mouth daily. Omega-3 360 mg   No facility-administered encounter medications on file as of 03/02/2018.      ONCOLOGIC FAMILY HISTORY:  Family History  Problem Relation Age of Onset  .  Cancer Mother        benign brain tumor   . Cancer Paternal Aunt        unknown type      GENETIC COUNSELING/TESTING: Not at this time  SOCIAL HISTORY:  Social History   Socioeconomic History  . Marital status: Married    Spouse name: Not on file  . Number of children: Not on file  . Years of education: Not on file  . Highest education level: Not on file  Occupational History  . Not on file  Social Needs  . Financial resource strain: Not on file  . Food insecurity:    Worry: Not on file    Inability: Not on file  . Transportation needs:    Medical: Not on file    Non-medical: Not on file  Tobacco Use  . Smoking status: Former Smoker    Packs/day: 0.50    Years: 10.00    Pack years: 5.00    Last attempt to quit: 11/21/1985    Years since quitting: 32.2  . Smokeless tobacco: Never Used  Substance and Sexual Activity  . Alcohol use: Yes    Comment: Socially  . Drug use: No  . Sexual activity: Yes  Lifestyle  . Physical activity:    Days per week: Not on file    Minutes per session: Not on file  . Stress: Not on file  Relationships  . Social connections:    Talks on phone: Not on file    Gets together: Not on file    Attends religious service: Not on file    Active member of club or organization: Not on file    Attends meetings of clubs or organizations: Not on file    Relationship status: Not on file  . Intimate partner violence:    Fear of current or ex partner: Not on file    Emotionally abused: Not on file    Physically abused: Not on file    Forced sexual activity: Not on file  Other Topics Concern  . Not on file  Social History Narrative  . Not on file      PHYSICAL EXAMINATION:  Vital Signs:   Vitals:   03/02/18 1414  BP: 103/85  Pulse: 90  Resp: 18  Temp: 98.2 F (36.8 C)  SpO2: 96%   Filed Weights   03/02/18 1414  Weight: 185 lb 14.4 oz (84.3 kg)   General: Well-nourished, well-appearing female in no acute distress.  She is  unaccompanied today.   HEENT: Head is normocephalic.  Pupils equal and reactive to light. Conjunctivae clear without exudate.  Sclerae  anicteric. Oral mucosa is pink, moist.  Oropharynx is pink without lesions or erythema.  Lymph: No cervical, supraclavicular, or infraclavicular lymphadenopathy noted on palpation.  Cardiovascular: Regular rate and rhythm.Marland Kitchen Respiratory: Clear to auscultation bilaterally. Chest expansion symmetric; breathing non-labored.  Breasts: right breast s/p lumpectomy and radiation, no sign of local recurrence, left breast benign GI: Abdomen soft and round; non-tender, non-distended. Bowel sounds normoactive.  GU: Deferred.  Neuro: No focal deficits. Steady gait.  Psych: Mood and affect normal and appropriate for situation.  Extremities: No edema. MSK: No focal spinal tenderness to palpation.  Full range of motion in bilateral upper extremities Skin: Warm and dry.  LABORATORY DATA:  [name] for this visit.  DIAGNOSTIC IMAGING:  [name] for this visit.      ASSESSMENT AND PLAN:  Ms.. Mellen is a pleasant 72 y.o. female with Stage IA right breast invasive ductal carcinoma, ER+/PR+/HER2-, diagnosed in 07/2017, treated with lumpectomy, adjuvant radiation therapy, and anti-estrogen therapy with Anastrozole beginning in 10/2017.  She presents to the Survivorship Clinic for our initial meeting and routine follow-up post-completion of treatment for breast cancer.    1. Stage IA right breast cancer:  [name] is continuing to recover from definitive treatment for breast cancer. She will follow-up with her medical oncologist, Dr. Burr Medico in 04/2018 with history and physical exam per surveillance protocol.  She will continue her anti-estrogen therapy with Anastrozole. Thus far, she is tolerating the Anastrozole well, with minimal side effects.  Today, a comprehensive survivorship care plan and treatment summary was reviewed with the patient today detailing her breast cancer diagnosis,  treatment course, potential late/long-term effects of treatment, appropriate follow-up care with recommendations for the future, and patient education resources.  A copy of this summary, along with a letter will be sent to the patient's primary care provider via mail/fax/In Basket message after today's visit.    2. Dry skin: I recommended she try Cetaphil cream for this.  This is likely cause of itching.  If it becomes severe enough, she may try stopping anastrozole briefly (1-2 week max) to see if the anastrozole is the cause.  This is a last resort though.  3. Bone health:  Given [name]'s age/history of breast cancer and her current treatment regimen including anti-estrogen therapy with Anastrozole, she is at risk for bone demineralization.  Her last DEXA scan was one year ago.  I have requested the results from her PCP.  She will continue to have these with Dr. Laurann Montana, and I let her know while taking Anastrozole they should be done every other year.  In the meantime, she was encouraged to increase her consumption of foods rich in calcium, as well as increase her weight-bearing activities.  She was given education on specific activities to promote bone health.  4. Cancer screening:  Due to [name]'s history and her age, she should receive screening for skin cancers, colon cancer, and gynecologic cancers.  The information and recommendations are listed on the patient's comprehensive care plan/treatment summary and were reviewed in detail with the patient.    5. Health maintenance and wellness promotion: [name] was encouraged to consume 5-7 servings of fruits and vegetables per day. We reviewed the "Nutrition Rainbow" handout, as well as the handout "Take Control of Your Health and Reduce Your Cancer Risk" from the [place].  She was also encouraged to engage in moderate to vigorous exercise for 30 minutes per day most days of the week. We discussed the [facility]  fitness  program, which is designed for cancer survivors to help them become more physically fit after cancer treatments.  She was instructed to limit her alcohol consumption and continue to abstain from tobacco use.     6. Support services/counseling: It is not uncommon for this period of the patient's cancer care trajectory to be one of many emotions and stressors.  We discussed an opportunity for her to participate in the next session of [facility] ("Finding Your New Normal") support group series designed for patients after they have completed treatment.   [name] was encouraged to take advantage of our many other support services programs, support groups, and/or counseling in coping with her new life as a cancer survivor after completing anti-cancer treatment.  She was offered support today through active listening and expressive supportive counseling.  She was given information regarding our available services and encouraged to contact me with any questions or for help enrolling in any of our support group/programs.    Dispo:   -Return to cancer center in 04/2018 for f/u with Dr. Burr Medico  -Mammogram due in 06/2018 -Follow up with surgery 10/2018 -She is welcome to return back to the Survivorship Clinic at any time; no additional follow-up needed at this time.  -Consider referral back to survivorship as a long-term survivor for continued surveillance  A total of (30) minutes of face-to-face time was spent with this patient with greater than 50% of that time in counseling and care-coordination.   Gardenia Phlegm, [place] [phone]   Note: PRIMARY CARE PROVIDER [name], [place] [phone]

## 2018-03-03 ENCOUNTER — Telehealth: Payer: Self-pay | Admitting: Adult Health

## 2018-03-03 NOTE — Telephone Encounter (Signed)
Per 12/16 no los °

## 2018-04-23 ENCOUNTER — Encounter: Payer: Self-pay | Admitting: Gastroenterology

## 2018-04-24 ENCOUNTER — Other Ambulatory Visit: Payer: Self-pay

## 2018-04-24 DIAGNOSIS — C50311 Malignant neoplasm of lower-inner quadrant of right female breast: Secondary | ICD-10-CM

## 2018-04-24 DIAGNOSIS — Z170 Estrogen receptor positive status [ER+]: Principal | ICD-10-CM

## 2018-04-24 NOTE — Progress Notes (Signed)
[place]   [phone] [phone]   Clinic Follow up Note   Patient Care Team: [name], MD as PCP - General [name], MD as Consulting Physician (General Surgery) Truitt Merle, MD as Consulting Physician (Hematology) Kyung Rudd, MD as Consulting Physician (Radiation Oncology) Delice Bison Charlestine Massed, NP as Nurse Practitioner (Hematology and Oncology)  Date of Service:  04/27/2018  CHIEF COMPLAINT: F/u of right breast cancer  SUMMARY OF ONCOLOGIC HISTORY: Oncology History   Cancer Staging Malignant neoplasm of lower-inner quadrant of right breast of female, estrogen receptor positive ([place]) Staging form: Breast, AJCC 8th Edition - Clinical stage from 07/16/2017: Stage IA (cT1b, cN0, cM0, G2, ER+, PR+, HER2-) - Signed by Truitt Merle, MD on 07/23/2017       Malignant neoplasm of lower-inner quadrant of right breast of female, estrogen receptor positive ([place])   07/15/2017 Mammogram    IMPRESSION: The new irregular lesion in the right breast is indeterminate. An ultrasound is recommended.     07/15/2017 Imaging    [place] R Breast and Axilla 07/15/17 IMPRESSION:  The 6 mm mass in the right breast is suspicious for malignancy. A biopsy is recommended.     07/16/2017 Cancer Staging    Staging form: Breast, AJCC 8th Edition - Clinical stage from 07/16/2017: Stage IA (cT1b, cN0, cM0, G2, ER+, PR+, HER2-) - Signed by Truitt Merle, MD on 07/23/2017    07/16/2017 Initial Biopsy    Diagnosis 07/16/17 Breast, right, needle core biopsy, 3 o'clock, 7cm - INVASIVE DUCTAL CARCINOMA, SEE COMMENT. - DUCTAL CARCINOMA IN SITU WITH NECROSIS.    07/16/2017 Receptors her2    Estrogen Receptor: 100%, POSITIVE, STRONG STAINING INTENSITY Progesterone Receptor: 80%, POSITIVE, STRONG STAINING INTENSITY Proliferation Marker Ki67: 10% HER2 - NEGATIVE    07/22/2017 Initial Diagnosis    Malignant neoplasm of lower-inner quadrant of right breast of female, estrogen receptor  positive ([place])    07/30/2017 Surgery    BREAST LUMPECTOMY WITH RADIOACTIVE SEED AND SENTINEL LYMPH NODE BIOPSY by Dr. Barry Dienes  07/30/17     07/30/2017 Pathology Results    Diagnosis 07/30/17  1. Breast, lumpectomy, right - INVASIVE DUCTAL CARCINOMA, GRADE 2, SPANNING 0.6 CM. - INTERMEDIATE GRADE DUCTAL CARCINOMA IN SITU. - FINAL RESECTION MARGINS ARE NEGATIVE. - BIOPSY SITE. - SEE ONCOLOGY TABLE. 2. Breast, excision, right additional lateral margin - FIBROCYSTIC CHANGE. - NO MALIGNANCY IDENTIFIED. 3. Lymph node, sentinel, biopsy, right axillary #1 - ONE OF ONE LYMPH NODES NEGATIVE FOR CARCINOMA (0/1). 4. Lymph node, sentinel, biopsy, right axillary #2 - ONE OF ONE LYMPH NODES NEGATIVE FOR CARCINOMA (0/1). 5. Lymph node, sentinel, biopsy, right axillary #3 - ONE OF ONE LYMPH NODES NEGATIVE FOR CARCINOMA (0/1). 6. Lymph node, sentinel, biopsy, right axillary #4 - ONE OF ONE LYMPH NODES NEGATIVE FOR CARCINOMA (0/1).    07/30/2017 Oncotype testing    Oncotype 07/30/17 Recurrence score 20 with a 6% risk of distant recurrence on Tamoxifen alone and a <1% benefit of chemotherapy     09/01/2017 - 09/29/2017 Radiation Therapy    Radiation with Dr. Lisbeth Renshaw  09/01/17 and  completed on 09/29/17.     10/2017 -  Anti-estrogen oral therapy    Anastrozole daily      CURRENT THERAPY:  Anastrozole 44m daily   INTERVAL HISTORY:  Christina PREZIOSOis here for a follow up of right breast cancer. She presents to the clinic today by herself. She notes she plans to take a trip with a 3.5 hour flight  and wonders she have to wear compression sleeve. She notes she does not have lymphedema currently. She continues to follow up with ENT for her parotid gland mass.  Anastrozole is tolerable mostly with dry itchy skin. She denies hot flashes or joint pain. She only has occasional trigger finger and occasionally limping from left hip aches. She notes she tries to walk every other day. She notes occasional shooting  right breast pain. She notes she was contact about her next mammogram by another company, not her usual [facility].     REVIEW OF SYSTEMS:   Constitutional: Denies fevers, chills or abnormal weight loss Eyes: Denies blurriness of vision Ears, nose, mouth, throat, and face: Denies mucositis or sore throat Respiratory: Denies cough, dyspnea or wheezes Cardiovascular: Denies palpitation, chest discomfort or lower extremity swelling Gastrointestinal:  Denies nausea, heartburn or change in bowel habits Skin: Denies abnormal skin rashes (+) dry, itchy skin  Lymphatics: Denies new lymphadenopathy or easy bruising Neurological:Denies numbness, tingling or new weaknesses Behavioral/Psych: Mood is stable, no new changes  All other systems were reviewed with the patient and are negative.  MEDICAL HISTORY:  Past Medical History:  Diagnosis Date  . Arthritis   . Cancer ([place]) 06/2017   right breast cancer  . GERD (gastroesophageal reflux disease)    mild- TUMS used sparingly  . Hyperlipidemia   . Hypertension   . PONV (postoperative nausea and vomiting)    also "passed out with cyst removal from neck"superficial"  . Syncope    x2 episodes-evaluated and findings normal- "usually related to seeing blood or thought of it"  . Vaso vagal episode    easily    SURGICAL HISTORY: Past Surgical History:  Procedure Laterality Date  . APPENDECTOMY    . BREAST LUMPECTOMY WITH RADIOACTIVE SEED AND SENTINEL LYMPH NODE BIOPSY Right 07/30/2017   Procedure: BREAST LUMPECTOMY WITH RADIOACTIVE SEED AND SENTINEL LYMPH NODE BIOPSY;  Surgeon: [name], MD;  Location: [facility];  Service: General;  Laterality: Right;  . BREAST SURGERY  08/27/11   right lumpectomy w/needle localization  . BUNIONECTOMY Bilateral 2000   bilateral -hardware remains  . CYST REMOVAL NECK     sebaceous  cyst from neck  . DILATION AND CURETTAGE OF UTERUS    . INTRAUTERINE DEVICE INSERTION    . IUD REMOVAL    .  LAPAROSCOPIC APPENDECTOMY N/A 03/27/2017   Procedure: APPENDECTOMY LAPAROSCOPIC;  Surgeon: Alphonsa Overall, MD;  Location: [facility];  Service: General;  Laterality: N/A;  . PAROTIDECTOMY Left 11/28/2017   SUPERFICIAL  . PAROTIDECTOMY Left 11/28/2017   Procedure: LEFT SUPEFICIAL PAROTIDECTOMY WITH OBSERVATION;  Surgeon: Jerrell Belfast, MD;  Location: [facility];  Service: ENT;  Laterality: Left;  . Removal Right Straphenous vein  2011  . TONSILLECTOMY     child  . TOTAL HIP ARTHROPLASTY Right 11/29/2015   Procedure: RIGHT TOTAL HIP ARTHROPLASTY ANTERIOR APPROACH;  Surgeon: Gaynelle Arabian, MD;  Location: [facility];  Service: Orthopedics;  Laterality: Right;    I have reviewed the social history and family history with the patient and they are unchanged from previous note.  ALLERGIES:  is allergic to dermacinrx surgical pharmapak.  MEDICATIONS:  Current Outpatient Medications  Medication Sig Dispense Refill  . acetaminophen (TYLENOL) 500 MG tablet Take 2 tablets (1,000 mg total) by mouth every 8 (eight) hours. 100 tablet 2  . anastrozole (ARIMIDEX) 1 MG tablet TAKE 1 TABLET(1 MG) BY MOUTH DAILY 90 tablet 3  . atorvastatin (LIPITOR) 80 MG tablet Take  80 mg by mouth every evening.    . Calcium-Phosphorus-Vitamin D (CITRACAL +D3 PO) Take 1 tablet by mouth daily.    . Cholecalciferol (VITAMIN D3) 2000 units TABS Take 2,000 Units by mouth daily.    Marland Kitchen FINACEA 15 % cream Apply 1 application topically daily. Applied to nose.  0  . LUTEIN-ZEAXANTHIN PO Take 1 tablet by mouth daily.    Marland Kitchen MICARDIS HCT 40-12.5 MG per tablet Take 1 tablet by mouth daily.     . Omega-3 Fatty Acids (FISH OIL) 1200 MG CAPS Take 1,200 mg by mouth daily. Omega-3 360 mg    . ZETIA 10 MG tablet Take 10 mg by mouth every evening.      No current facility-administered medications for this visit.     PHYSICAL EXAMINATION: ECOG PERFORMANCE STATUS: 0 - Asymptomatic  Vitals:   04/27/18 1320  BP: 132/75  Pulse: 88  Resp: 18  Temp:  98.4 F (36.9 C)  SpO2: 96%   Filed Weights   04/27/18 1320  Weight: 186 lb 6.4 oz (84.6 kg)    GENERAL:alert, no distress and comfortable SKIN: skin color, texture, turgor are normal, no rashes or significant lesions EYES: normal, Conjunctiva are pink and non-injected, sclera clear OROPHARYNX:no exudate, no erythema and lips, buccal mucosa, and tongue normal  NECK: supple, thyroid normal size, non-tender, without nodularity LYMPH:  no palpable lymphadenopathy in the cervical, axillary or inguinal LUNGS: clear to auscultation and percussion with normal breathing effort HEART: regular rate & rhythm and no murmurs and no lower extremity edema ABDOMEN:abdomen soft, non-tender and normal bowel sounds Musculoskeletal:no cyanosis of digits and no clubbing  NEURO: alert & oriented x 3 with fluent speech, no focal motor/sensory deficits BREAST: S/p right lumpectomy: Surgical incision healed well (+) right breast swelling   LABORATORY DATA:  I have reviewed the data as listed CBC Latest Ref Rng & Units 04/27/2018 12/24/2017 11/18/2017  WBC 4.0 - 10.5 K/uL 6.8 6.5 5.6  Hemoglobin 12.0 - 15.0 g/dL 14.3 14.3 14.9  Hematocrit 36.0 - 46.0 % 43.1 42.4 45.8  Platelets 150 - 400 K/uL 210 204 205     CMP Latest Ref Rng & Units 04/27/2018 12/24/2017 11/18/2017  Glucose 70 - 99 mg/dL 88 90 98  BUN 8 - 23 mg/dL '13 14 19  ' Creatinine 0.44 - 1.00 mg/dL 0.85 0.82 0.85  Sodium 135 - 145 mmol/L 141 140 139  Potassium 3.5 - 5.1 mmol/L 3.9 3.7 4.2  Chloride 98 - 111 mmol/L 104 103 104  CO2 22 - 32 mmol/L '29 25 25  ' Calcium 8.9 - 10.3 mg/dL 9.8 9.9 9.9  Total Protein 6.5 - 8.1 g/dL 7.5 7.7 -  Total Bilirubin 0.3 - 1.2 mg/dL 1.0 1.2 -  Alkaline Phos 38 - 126 U/L 68 69 -  AST 15 - 41 U/L 23 22 -  ALT 0 - 44 U/L 25 18 -      RADIOGRAPHIC STUDIES: I have personally reviewed the radiological images as listed and agreed with the findings in the report. No results found.   ASSESSMENT & PLAN:  [name]  is a 73 y.o. female with   1. 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 She is s/p right breast lumpectomy and adjuvant radiation.  -Her Oncotype score was 20 with 9-year distant recurrence risk of 6% with tamoxifen or AI. I do not recommend adjuvant  chemotherapy.  -She started antiestrogen therapy with anastrozole in 10/2017. She is tolerating moderately well with manageable skin dryness and itching, will continue for 5 to 7 years. -She is clinically doing well. Lab reviewed, her CBC and CMP are within normal limits. Her physical exam and her 06/2017 mammogram were unremarkable. There is no clinical concern for recurrence. -Next mammogram in 06/2018  -Continue anastrozole  -F/u in 4 months then every 6 months    2. Bone Health, osteoporosis   -She notes having a fracture in early 2019 with little physical cause which is a concern for osteoprosis  -DEXA from 05/2017 with lowest T-score of -1.3 at left femur neck.   -She is currently on daily calcium supplement, will continue -She would benefit from biophosphonate, I will discuss with her on next visit.   3. Left Parotid gland mass, benign  -She had parotidectomy with Dr. Wilburn Cornelia recently. She is recovering well.  -path reviewed, benign  -Continue to follow up with ENT.    4. HTN -Continue medication and follow-up with PCP   PLAN:  Lab and f/u 4 months  Continue anastrozole  Will discuss biphosphonate on next visit   No problem-specific Assessment & Plan notes found for this encounter.   Orders Placed This Encounter  Procedures  . MM DIAG BREAST TOMO BILATERAL    Standing Status:   Future    Standing Expiration Date:   04/28/2019    Scheduling Instructions:     [facility]    Order Specific Question:   Reason for Exam (SYMPTOM  OR DIAGNOSIS REQUIRED)    Answer:   screening     Order Specific Question:   Preferred imaging location?    Answer:   External   All questions were answered. The patient knows to call the clinic with any problems, questions or concerns. No barriers to learning was detected. I spent 15 minutes counseling the patient face to face. The total time spent in the appointment was 20 minutes and more than 50% was on counseling and review of test results     Truitt Merle, MD 04/27/2018   I, Joslyn Devon, am acting as scribe for Truitt Merle, MD.   I have reviewed the above documentation for accuracy and completeness, and I agree with the above.

## 2018-04-27 ENCOUNTER — Telehealth: Payer: Self-pay | Admitting: Hematology

## 2018-04-27 ENCOUNTER — Encounter: Payer: Self-pay | Admitting: Hematology

## 2018-04-27 ENCOUNTER — Inpatient Hospital Stay: Payer: Medicare Other | Attending: Hematology

## 2018-04-27 ENCOUNTER — Inpatient Hospital Stay (HOSPITAL_BASED_OUTPATIENT_CLINIC_OR_DEPARTMENT_OTHER): Payer: Medicare Other | Admitting: Hematology

## 2018-04-27 VITALS — BP 132/75 | HR 88 | Temp 98.4°F | Resp 18 | Ht 65.0 in | Wt 186.4 lb

## 2018-04-27 DIAGNOSIS — M1990 Unspecified osteoarthritis, unspecified site: Secondary | ICD-10-CM | POA: Diagnosis not present

## 2018-04-27 DIAGNOSIS — C50311 Malignant neoplasm of lower-inner quadrant of right female breast: Secondary | ICD-10-CM

## 2018-04-27 DIAGNOSIS — I10 Essential (primary) hypertension: Secondary | ICD-10-CM

## 2018-04-27 DIAGNOSIS — M810 Age-related osteoporosis without current pathological fracture: Secondary | ICD-10-CM | POA: Insufficient documentation

## 2018-04-27 DIAGNOSIS — M25552 Pain in left hip: Secondary | ICD-10-CM

## 2018-04-27 DIAGNOSIS — E785 Hyperlipidemia, unspecified: Secondary | ICD-10-CM

## 2018-04-27 DIAGNOSIS — Z923 Personal history of irradiation: Secondary | ICD-10-CM | POA: Diagnosis not present

## 2018-04-27 DIAGNOSIS — Z170 Estrogen receptor positive status [ER+]: Secondary | ICD-10-CM | POA: Insufficient documentation

## 2018-04-27 DIAGNOSIS — K219 Gastro-esophageal reflux disease without esophagitis: Secondary | ICD-10-CM | POA: Diagnosis not present

## 2018-04-27 DIAGNOSIS — Z79811 Long term (current) use of aromatase inhibitors: Secondary | ICD-10-CM | POA: Insufficient documentation

## 2018-04-27 DIAGNOSIS — Z87891 Personal history of nicotine dependence: Secondary | ICD-10-CM | POA: Insufficient documentation

## 2018-04-27 DIAGNOSIS — R269 Unspecified abnormalities of gait and mobility: Secondary | ICD-10-CM

## 2018-04-27 DIAGNOSIS — Z79899 Other long term (current) drug therapy: Secondary | ICD-10-CM

## 2018-04-27 LAB — CBC WITH DIFFERENTIAL (CANCER CENTER ONLY)
ABS IMMATURE GRANULOCYTES: 0.03 10*3/uL (ref 0.00–0.07)
BASOS ABS: 0.1 10*3/uL (ref 0.0–0.1)
BASOS PCT: 1 %
EOS ABS: 0.1 10*3/uL (ref 0.0–0.5)
Eosinophils Relative: 2 %
HCT: 43.1 % (ref 36.0–46.0)
Hemoglobin: 14.3 g/dL (ref 12.0–15.0)
Immature Granulocytes: 0 %
Lymphocytes Relative: 24 %
Lymphs Abs: 1.6 10*3/uL (ref 0.7–4.0)
MCH: 32.1 pg (ref 26.0–34.0)
MCHC: 33.2 g/dL (ref 30.0–36.0)
MCV: 96.9 fL (ref 80.0–100.0)
Monocytes Absolute: 0.5 10*3/uL (ref 0.1–1.0)
Monocytes Relative: 8 %
NEUTROS ABS: 4.4 10*3/uL (ref 1.7–7.7)
NRBC: 0 % (ref 0.0–0.2)
Neutrophils Relative %: 65 %
PLATELETS: 210 10*3/uL (ref 150–400)
RBC: 4.45 MIL/uL (ref 3.87–5.11)
RDW: 12.8 % (ref 11.5–15.5)
WBC Count: 6.8 10*3/uL (ref 4.0–10.5)

## 2018-04-27 LAB — CMP (CANCER CENTER ONLY)
ALBUMIN: 4.3 g/dL (ref 3.5–5.0)
ALK PHOS: 68 U/L (ref 38–126)
ALT: 25 U/L (ref 0–44)
ANION GAP: 8 (ref 5–15)
AST: 23 U/L (ref 15–41)
BILIRUBIN TOTAL: 1 mg/dL (ref 0.3–1.2)
BUN: 13 mg/dL (ref 8–23)
CALCIUM: 9.8 mg/dL (ref 8.9–10.3)
CO2: 29 mmol/L (ref 22–32)
Chloride: 104 mmol/L (ref 98–111)
Creatinine: 0.85 mg/dL (ref 0.44–1.00)
GFR, Estimated: >60 mL/min (ref >60)
GLUCOSE: 88 mg/dL (ref 70–99)
POTASSIUM: 3.9 mmol/L (ref 3.5–5.1)
Sodium: 141 mmol/L (ref 135–145)
TOTAL PROTEIN: 7.5 g/dL (ref 6.5–8.1)

## 2018-04-27 NOTE — Telephone Encounter (Signed)
Called and scheduled appt per 02/10 los.  Patient aware of appt date and time.

## 2018-04-29 ENCOUNTER — Telehealth: Payer: Self-pay

## 2018-04-29 ENCOUNTER — Other Ambulatory Visit: Payer: Self-pay | Admitting: Otolaryngology

## 2018-04-29 DIAGNOSIS — K118 Other diseases of salivary glands: Secondary | ICD-10-CM

## 2018-04-29 NOTE — Telephone Encounter (Signed)
-----   Message from Truitt Merle, MD sent at 04/29/2018  7:06 AM EST ----- Please let pt know her lab were WNL, no concerns, thanks   Truitt Merle  04/29/2018

## 2018-04-29 NOTE — Telephone Encounter (Signed)
Spoke with patient regarding lab results, per Dr. Burr Medico all were WNL no concerns.

## 2018-05-25 ENCOUNTER — Ambulatory Visit
Admission: RE | Admit: 2018-05-25 | Discharge: 2018-05-25 | Disposition: A | Discharge status: Home / Self Care | Payer: Medicare Other | Source: Ambulatory Visit | Attending: Otolaryngology | Admitting: Otolaryngology

## 2018-05-25 DIAGNOSIS — K118 Other diseases of salivary glands: Secondary | ICD-10-CM

## 2018-05-25 MED ORDER — IOPAMIDOL (ISOVUE-300) INJECTION 61%
75.0000 mL | Freq: Once | INTRAVENOUS | Status: AC | PRN
  Administered 2018-05-25: 75 mL via INTRAVENOUS

## 2018-06-29 HISTORY — PX: PAROTIDECTOMY: SUR1003

## 2018-07-13 ENCOUNTER — Telehealth: Payer: Self-pay | Admitting: *Deleted

## 2018-07-13 ENCOUNTER — Encounter: Payer: Self-pay | Admitting: Hematology

## 2018-07-13 DIAGNOSIS — C089 Malignant neoplasm of major salivary gland, unspecified: Secondary | ICD-10-CM | POA: Insufficient documentation

## 2018-07-13 NOTE — Telephone Encounter (Addendum)
Oncology Nurse Navigator Documentation  Placed introductory call to new referral patient Ms. Deery.  Introduced myself as the H&N oncology nurse navigator that works with Dr. Isidore Moos to whom she has been referred by Dr. Nicolette Bang for adjuvant RT s/p 4/13 L parotidectomy.  She confirmed understanding of referral.  Briefly explained my role as her navigator, provided my contact information.   Confirmed understanding of upcoming appts and [facility] location, explained arrival and registration process.  I explained the purpose of a dental evaluation prior to starting RT, indicated Dr. Isidore Moos may refer her to [facility] for pre-radiotherapy dental evaluation.  I encouraged her to call with questions/concerns as she moves forward with appts and procedures.    She verbalized understanding of information provided, expressed appreciation for my call.   Navigator Initial Assessment . Employment Status/FMLA/STD:  Working, currently on STD/FMLA. . Support System: Husband . PCP: Yes, Izora Gala . PCD:  Yes, Dr. Christie Nottingham.  All own teeth, few fillings, wisdom teeth removed. . Transportation Needs: No . Sensory Deficits/Language Barriers/Interpreter Needed:  No . Ambulation Needs: No . DME Used in Home: No . Psychosocial Needs:  No . Concerns/Needs Understanding Cancer:  Navigator answered questions. . Self-Expressed Needs: No

## 2018-07-14 ENCOUNTER — Telehealth: Payer: Self-pay | Admitting: Hematology

## 2018-07-14 ENCOUNTER — Other Ambulatory Visit: Payer: Self-pay | Admitting: Hematology

## 2018-07-14 ENCOUNTER — Encounter: Payer: Self-pay | Admitting: *Deleted

## 2018-07-14 DIAGNOSIS — C089 Malignant neoplasm of major salivary gland, unspecified: Secondary | ICD-10-CM

## 2018-07-14 NOTE — Telephone Encounter (Signed)
Patient called back in stating that she did not want appointment to see Dr Maylon Peppers at this time due to her already seeing a physician in [place] and also seeing Med Onc at [facility] next week.  I did send IB message to Dr Maylon Peppers with this information as well.

## 2018-07-14 NOTE — Telephone Encounter (Signed)
Called and LMVM for patient with date/time/location of appt per 4/27 staff message from [place]

## 2018-07-14 NOTE — Progress Notes (Signed)
New patient packet sent to patient through mail.

## 2018-07-15 ENCOUNTER — Encounter: Payer: Self-pay | Admitting: Radiation Oncology

## 2018-07-15 NOTE — Progress Notes (Signed)
Head and Neck Cancer Location of Tumor / Histology:  06/29/18 FINAL PATHOLOGIC DIAGNOSIS MICROSCOPIC EXAMINATION AND DIAGNOSIS "LEFT SUPERIOR DEEP LOBE PAROTID MASS," PAROTIDECTOMY: High grade salivary gland carcinoma, most consistent with salivary duct carcinoma. Tumor measures 4.0 cm in greatest dimension. Lymphovascular invasion identified. No definite evidence of perineural invasion. Tumor focally extends to the resection margin. See note and CAP cancer summary. Note: The tumor cells are positive for GCDFP-15, GATA3 and cyclin D1 while negative for ER. 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 Wilburn Cornelia documents: Patient contacted by telephone to discuss results of recent fine-needle aspiration of left periparotid mass. Patient underwent FNA on 3/10 for cystic mass at the site of her previous left superficial parotidectomy. Initial pathology from resected parotid tissue consistent with benign lymph nodes, salivary tissue and small scattered cystic changes without evidence of malignancy. The patient has had a enlarging mass at the superior aspect of her surgical dissection, CT scan on 3/9 showed a 3 cm cystic soft tissue mass in the left superior cheek. Fine-needle aspiration performed.  Pathology shows cohesive groups of malignant cells as well is scattered malignant cells, pathologist favored carcinoma. Findings discussed with the patient and her husband, plan to reach out to head and neck surgeons at [facility] regarding excision and possible reconstruction of the left facial soft tissue. We will contact the patient with further recommendations next week   Biopsies of let superior deep lobe parotid mass revealed: high grade salivary gland carcinoma  Nutrition Status Yes No Comments  Weight changes? []  [x]    Swallowing concerns? []  [x]     PEG? []  []     Referrals Yes No Comments  Social Work? []  [x]    Dentistry? []  [x]    Swallowing therapy? []  [x]    Nutrition? []  [x]    Med/Onc? []  [x]     Safety Issues Yes No Comments  Prior radiation? [x]  []  Radiation treatment dates:   09/01/2017 - 09/29/2017  Site/dose:   The patient initially received a dose of 42.56 Gy in 16 fractions to the right breast using whole-breast tangent fields. This was delivered using a 3-D conformal technique. The patient then received a boost to the seroma. This delivered an additional 8 Gy in 4 fractions using an en face electron field due to the depth of the seroma. The total dose was 50.56 Gy.  Pacemaker/ICD? []  [x]    Possible current pregnancy? []  [x]    Is the patient on methotrexate? []  [x]     Tobacco/Marijuana/Snuff/ETOH use: She is a former smoker quitting in 1987. She drinks alcohol socially.   Past/Anticipated interventions by otolaryngology, if any:  11/29/18 Surgical Procedure:  Left Superficial Parotidectomy with NIMS Monitoring- Dr. Wilburn Cornelia  06/29/18 Left Parotidectomy by Dr. Nicolette Bang  Follow up appointment scheduled for 07/21/18  Past/Anticipated interventions by medical oncology, if any:  Dr. Burr Medico in June for breast cancer follow up.    Current Complaints / other details:

## 2018-07-17 ENCOUNTER — Ambulatory Visit: Payer: Medicare Other | Admitting: Hematology

## 2018-07-17 ENCOUNTER — Other Ambulatory Visit: Payer: Medicare Other

## 2018-07-21 ENCOUNTER — Encounter: Payer: Self-pay | Admitting: *Deleted

## 2018-07-22 ENCOUNTER — Other Ambulatory Visit: Payer: Self-pay

## 2018-07-22 ENCOUNTER — Encounter: Payer: Self-pay | Admitting: Radiation Oncology

## 2018-07-22 ENCOUNTER — Ambulatory Visit
Admission: RE | Admit: 2018-07-22 | Discharge: 2018-07-22 | Disposition: A | Discharge status: Home / Self Care | Payer: Medicare Other | Source: Ambulatory Visit | Attending: Radiation Oncology | Admitting: Radiation Oncology

## 2018-07-22 DIAGNOSIS — C07 Malignant neoplasm of parotid gland: Secondary | ICD-10-CM

## 2018-07-22 DIAGNOSIS — C089 Malignant neoplasm of major salivary gland, unspecified: Secondary | ICD-10-CM

## 2018-07-22 NOTE — Progress Notes (Signed)
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 female who presented with left parotid mass. CT in July 2019 showed a left parotid mass most consistent with primary parotid neoplasm. The patient saw Dr. Wilburn [name] who performed left superficial parotidectomy in Sept 2019 revealing BENIGN SALIVARY GLAND-TYPE TISSUE WITH SCATTERED SMALL CYSTS. However the mass continued to grow. CT of neck 05-25-18 showed Recurrent necrotic mass lesion in the left parotid bed now measures 3.4 cm. Larger than prior.  She then saw Dr Christina [name] who performed completion parotidectomy 06-29-18. See [name] below. Note High grade salivary gland carcinoma, most consistent withsalivary duct carcinoma and + margins. No PNI, + LVSI.  No nodes removed.  She feels well today. Has some post op eye irration, states cream has been ordered for this.  Denies dry mouth. Mild left facial weakness has improved since surgery.  Path [name]: RECEIVED:  06/29/2018 ORDERING PHYSICIAN: Christina [name] , MD PATIENT NAME: Zuercher, Christina [name]  FINAL PATHOLOGIC DIAGNOSIS MICROSCOPIC EXAMINATION AND DIAGNOSIS  "LEFT SUPERIOR DEEP LOBE PAROTID MASS," PAROTIDECTOMY: High grade salivary gland carcinoma, most consistent with salivary duct carcinoma. Tumor measures 4.0 cm in greatest dimension. Lymphovascular invasion identified. No definite evidence of perineural invasion. Tumor focally extends to the resection margin. See note and CAP cancer summary.   Note: The tumor cells are positive for GCDFP-15, GATA3 and cyclin D1 while negative for ER. The morphology and immuno profile are most consistent with a salivary duct carcinoma.   COMMENT: College of American Pathologists Surgical Pathology Cancer Case Summary:           MAJOR SALIVARY GLANDS RESECTION  Protocol posting date: June 2017 Version: SalivaryGland 4.0.0.1  PROCEDURE: Parotidectomy (completion), TUMOR SITE: Parotid gland, deep lobe TUMOR LATERALITY: Left TUMOR FOCALITY: Unifocal TUMOR SIZE:    GREATEST DIMENSION: 4.0 cm    ADDITIONAL DIMENSIONS: 3.1 x 2.6 cm HISTOLOGIC TYPE: Salivary duct carcinoma TUMOR EXTENSION: Tumor is confined to the parotid gland MARGINS: Focally involved by carcinoma LYMPHOVASCULAR INVASION: present PERINERUAL INVASION: Not identified REGIONAL LYMPH NODES: No regional lymph nodes submitted or found PATHOLOGIC STAGE CLASSIFICATION (pTNM, AJCC 8th Ed): pT2 pNX DISTANT METASTASIS (pM): N/A ADDITIONAL PATHOLOGIC FINDINGS: None  PREVIOUS RADIATION THERAPY: Yes Radiation treatment dates:   09/01/2017 - 09/29/2017  Site/dose:   The patient initially received a dose of 42.56 Gy in 16 fractions to the right breast using whole-breast tangent fields. This was delivered using a 3-D conformal technique. The patient then received a boost  to the seroma. This delivered an additional 8  Gy in 4 fractions using an en face electron field due to the depth of the seroma. The total dose was 50.56 Gy.  PAST MEDICAL HISTORY:  has a past medical history of Arthritis, Cancer ([place]) (06/2017), GERD (gastroesophageal reflux disease), Hyperlipidemia, Hypertension, PONV (postoperative nausea and vomiting), Syncope, and Vaso vagal episode.    PAST SURGICAL HISTORY: Past Surgical History:  Procedure Laterality Date  . APPENDECTOMY    . BREAST LUMPECTOMY WITH RADIOACTIVE SEED AND SENTINEL LYMPH NODE BIOPSY Right 07/30/2017   Procedure: BREAST LUMPECTOMY WITH RADIOACTIVE SEED AND SENTINEL LYMPH NODE BIOPSY;  Surgeon: [name], MD;  Location: [facility];  Service: General;  Laterality: Right;  . BREAST SURGERY  08/27/11   right lumpectomy w/needle localization  . BUNIONECTOMY Bilateral 2000   bilateral -hardware remains  . CYST REMOVAL NECK     sebaceous  cyst from neck  . DILATION AND CURETTAGE OF UTERUS    . INTRAUTERINE DEVICE INSERTION    . IUD REMOVAL    . LAPAROSCOPIC APPENDECTOMY N/A 03/27/2017   Procedure: APPENDECTOMY LAPAROSCOPIC;  Surgeon: Alphonsa Overall, MD;  Location: [facility];  Service: General;  Laterality: N/A;  . PAROTIDECTOMY Left 11/28/2017   SUPERFICIAL  . PAROTIDECTOMY Left 11/28/2017   Procedure: LEFT SUPEFICIAL PAROTIDECTOMY WITH OBSERVATION;  Surgeon: Jerrell Belfast, MD;  Location: [facility];  Service: ENT;  Laterality: Left;  . PAROTIDECTOMY Left 06/29/2018   Dr[facility]icolette [name] at [facility]. Deep parotidectomy  . Removal Right Straphenous vein  2011  . TONSILLECTOMY     child  . TOTAL HIP ARTHROPLASTY Right 11/29/2015   Procedure: RIGHT TOTAL HIP ARTHROPLASTY ANTERIOR APPROACH;  Surgeon: Gaynelle Arabian, MD;  Location: [facility];  Service: Orthopedics;  Laterality: Right;    FAMILY HISTORY: family history includes Cancer in her mother and paternal aunt.  SOCIAL HISTORY:  reports that she quit smoking about 32 years ago. She has a 5.00  pack-year smoking history. She has never used smokeless tobacco. She reports current alcohol use. She reports that she does not use drugs.  ALLERGIES: Dermacinrx surgical pharmapak and Iohexol  MEDICATIONS:  Current Outpatient Medications  Medication Sig Dispense Refill  . acetaminophen (TYLENOL) 500 MG tablet Take 2 tablets (1,000 mg total) by mouth every 8 (eight) hours. 100 tablet 2  . anastrozole (ARIMIDEX) 1 MG tablet TAKE 1 TABLET(1 MG) BY MOUTH DAILY 90 tablet 3  . atorvastatin (LIPITOR) 80 MG tablet Take 80 mg by mouth every evening.    . Calcium-Phosphorus-Vitamin D (CITRACAL +D3 PO) Take 1 tablet by mouth daily.    . Cholecalciferol (VITAMIN D3) 2000 units TABS Take 2,000 Units by mouth daily.    Marland Kitchen FINACEA 15 % cream Apply 1 application topically daily. Applied to nose.  0  . LUTEIN-ZEAXANTHIN PO Take 1 tablet by mouth daily.    Marland Kitchen MICARDIS HCT 40-12.5 MG per tablet Take 1 tablet by mouth daily.     . Omega-3 Fatty Acids (FISH OIL) 1200 MG CAPS Take 1,200 mg by mouth daily. Omega-3 360 mg    . ZETIA 10 MG tablet Take 10 mg by mouth every evening.      No current facility-administered medications for this encounter.     REVIEW OF SYSTEMS:  Notable for that above.   PHYSICAL EXAM:  vitals were not taken for this visit.   NAD No obvious facial droop   LABORATORY DATA:  Lab Results  Component Value Date  WBC 6.8 04/27/2018   HGB 14.3 04/27/2018   HCT 43.1 04/27/2018   MCV 96.9 04/27/2018   PLT 210 04/27/2018   CMP     Component Value Date/Time   NA 141 04/27/2018 1225   K 3.9 04/27/2018 1225   CL 104 04/27/2018 1225   CO2 29 04/27/2018 1225   GLUCOSE 88 04/27/2018 1225   BUN 13 04/27/2018 1225   CREATININE 0.85 04/27/2018 1225   CALCIUM 9.8 04/27/2018 1225   PROT 7.5 04/27/2018 1225   ALBUMIN 4.3 04/27/2018 1225   AST 23 04/27/2018 1225   ALT 25 04/27/2018 1225   ALKPHOS 68 04/27/2018 1225   BILITOT 1.0 04/27/2018 1225   GFRNONAA >60 04/27/2018 1225    GFRAA >60 04/27/2018 1225      No results found for: TSH   RADIOGRAPHY: No results found.  Of note, CT chest wo cont 07-20-18 done at [facility] is negative for metastases per [name]     IMPRESSION/PLAN:  This is a delightful patient with head and neck cancer. I do recommend radiotherapy for this patient.  We discussed the potential risks, benefits, and side effects of radiotherapy. We talked in detail about acute and late effects. We discussed that some of the most bothersome acute effects may be mucositis, dysgeusia, salivary changes, skin irritation, hair loss, dehydration, weight loss and fatigue. We talked about late effects which include but are not necessarily limited to xerostomia, tissue injury, and neck edema. No guarantees of treatment were given. A consent form was signed and placed in the patient's medical record. The patient is enthusiastic about proceeding with treatment. I look forward to participating in the patient's care.    Simulation (treatment planning) will take place in the near future  We also discussed that the treatment of head and neck cancer is a multidisciplinary process to maximize treatment outcomes and quality of life. For this reason the following referrals have been or will be made:   Medical oncology to discuss chemotherapy - she declined a consult; tumor board discussion concludes chemotherapy is not in her interest  She would like to hold off on nutritionist consult PRN.   This encounter was provided by telemedicine platform Webex for pandemic precautions.  The patient has given verbal consent for this type of encounter and has been advised to only accept a meeting of this type in a secure network environment. The time spent during this encounter was OVER 30 minutes. The attendants for this meeting include Eppie Gibson  and JAYNA MULNIX.  During the encounter, Eppie Gibson was located at [facility] Radiation Oncology Department.  AMANPREET DELMONT  was located at home.   __________________________________________   Eppie Gibson, MD

## 2018-07-24 ENCOUNTER — Encounter: Payer: Self-pay | Admitting: Radiation Oncology

## 2018-07-24 DIAGNOSIS — C07 Malignant neoplasm of parotid gland: Secondary | ICD-10-CM | POA: Insufficient documentation

## 2018-07-27 ENCOUNTER — Ambulatory Visit
Admission: RE | Admit: 2018-07-27 | Discharge: 2018-07-27 | Disposition: A | Discharge status: Home / Self Care | Payer: Medicare Other | Source: Ambulatory Visit | Attending: Radiation Oncology | Admitting: Radiation Oncology

## 2018-07-27 ENCOUNTER — Other Ambulatory Visit: Payer: Self-pay

## 2018-07-27 DIAGNOSIS — Z510 Encounter for antineoplastic radiation therapy: Secondary | ICD-10-CM | POA: Insufficient documentation

## 2018-07-27 DIAGNOSIS — C07 Malignant neoplasm of parotid gland: Secondary | ICD-10-CM | POA: Diagnosis not present

## 2018-07-27 NOTE — Progress Notes (Signed)
Head and Neck Cancer Simulation, IMRT treatment planning note   Outpatient  Diagnosis:    ICD-10-CM   1. Cancer of parotid gland [facility]) C07     The patient was taken to the CT simulator and identity was confirmed.  All relevant records and images related to the planned course of therapy were reviewed.  The patient freely provided informed written consent to proceed with treatment after reviewing the details related to the planned course of therapy. The consent form was witnessed and verified by the simulation staff.    I marked the patient's scar with wiring.  Her surgical bed/ scar have healed well per my exam.  Concavity noted in left parotid bed.  The patient was laid in the supine position on the table. An Aquaplast head and shoulder mask was custom fitted to the patient's anatomy. High-resolution CT axial imaging was obtained of the head and neck with contrast. I verified that the quality of the imaging is good for treatment planning. 1 Medically Necessary Treatment Device was fabricated and supervised by me: Aquaplast mask.  Treatment planning note I plan to treat the patient with IMRT. I plan to treat the patient's tumor bed and ipsilateral  neck nodes. I plan to treat to a total dose of 66 Gray in 33  fractions. Dose calculation was ordered from dosimetry.  IMRT planning Note  IMRT is medically necessary and an important modality to deliver adequate dose to the patient's at risk tissues while sparing the patient's normal structures, including the: esophagus, parotid tissue, mandible, brain stem, spinal cord, oral cavity, brachial plexus.  This justifies the use of IMRT in the patient's treatment.    -----------------------------------  Eppie Gibson, MD

## 2018-07-28 DIAGNOSIS — Z510 Encounter for antineoplastic radiation therapy: Secondary | ICD-10-CM | POA: Diagnosis not present

## 2018-07-30 NOTE — Progress Notes (Signed)
Oncology Nurse Navigator Documentation  Joined patient and Dr. Isidore Moos for WebEx New Consult.  Pt referred by Dr. Nicolette Bang for adjuvant RT s/p her 4/13 L parotidedctomy. She voiced understanding of proposed plan for 6-7 weeks M-F RT to tumor bed and L neck, CT SIM to be within next week. Following consult:  Further introduced myself as her navigator, confirmed she has my contact information.  Reviewed New Patient Packet she received by mail.  She denied questions. I encouraged her to call me with needs/concerns as she moves forward with appts/procedures.  She agreed to do so.  Gayleen Orem, RN, BSN Head & Neck Oncology Nurse Lane at [place] [phone]

## 2018-08-05 ENCOUNTER — Ambulatory Visit
Admission: RE | Admit: 2018-08-05 | Discharge: 2018-08-05 | Disposition: A | Discharge status: Home / Self Care | Payer: Medicare Other | Source: Ambulatory Visit | Attending: Radiation Oncology | Admitting: Radiation Oncology

## 2018-08-05 ENCOUNTER — Other Ambulatory Visit: Payer: Self-pay

## 2018-08-05 DIAGNOSIS — Z510 Encounter for antineoplastic radiation therapy: Secondary | ICD-10-CM | POA: Diagnosis not present

## 2018-08-06 ENCOUNTER — Other Ambulatory Visit: Payer: Self-pay

## 2018-08-06 ENCOUNTER — Ambulatory Visit
Admission: RE | Admit: 2018-08-06 | Discharge: 2018-08-06 | Disposition: A | Discharge status: Home / Self Care | Payer: Medicare Other | Source: Ambulatory Visit | Attending: Radiation Oncology | Admitting: Radiation Oncology

## 2018-08-06 DIAGNOSIS — Z510 Encounter for antineoplastic radiation therapy: Secondary | ICD-10-CM | POA: Diagnosis not present

## 2018-08-07 ENCOUNTER — Other Ambulatory Visit: Payer: Self-pay

## 2018-08-07 ENCOUNTER — Ambulatory Visit: Payer: Medicare Other

## 2018-08-07 ENCOUNTER — Ambulatory Visit
Admission: RE | Admit: 2018-08-07 | Discharge: 2018-08-07 | Disposition: A | Discharge status: Home / Self Care | Payer: Medicare Other | Source: Ambulatory Visit | Attending: Radiation Oncology | Admitting: Radiation Oncology

## 2018-08-07 DIAGNOSIS — Z510 Encounter for antineoplastic radiation therapy: Secondary | ICD-10-CM | POA: Diagnosis not present

## 2018-08-11 ENCOUNTER — Other Ambulatory Visit: Payer: Self-pay

## 2018-08-11 ENCOUNTER — Ambulatory Visit: Payer: Medicare Other

## 2018-08-11 ENCOUNTER — Ambulatory Visit
Admission: RE | Admit: 2018-08-11 | Discharge: 2018-08-11 | Disposition: A | Discharge status: Home / Self Care | Payer: Medicare Other | Source: Ambulatory Visit | Attending: Radiation Oncology | Admitting: Radiation Oncology

## 2018-08-11 DIAGNOSIS — Z510 Encounter for antineoplastic radiation therapy: Secondary | ICD-10-CM | POA: Diagnosis not present

## 2018-08-11 NOTE — Progress Notes (Signed)
Pt here for patient teaching.  Pt given Radiation and You booklet, Managing Acute Radiation Side Effects for Head and Neck Cancer handout and skin care instructions.  Reviewed areas of pertinence such as fatigue, hair loss, mouth changes, skin changes and taste changes . Pt able to give teach back of to pat skin, use unscented/gentle soap and drink plenty of water,avoid applying anything to skin within 4 hours of treatment. Pt verbalizes understanding of information given and will contact nursing with any questions or concerns. She is allergic to sonafine and radiaplex. She plans to use pure aloe to treat skin irritation from radiation.      [email or web address]

## 2018-08-12 ENCOUNTER — Other Ambulatory Visit: Payer: Self-pay

## 2018-08-12 ENCOUNTER — Ambulatory Visit
Admission: RE | Admit: 2018-08-12 | Discharge: 2018-08-12 | Disposition: A | Discharge status: Home / Self Care | Payer: Medicare Other | Source: Ambulatory Visit | Attending: Radiation Oncology | Admitting: Radiation Oncology

## 2018-08-12 ENCOUNTER — Ambulatory Visit: Payer: Medicare Other

## 2018-08-12 DIAGNOSIS — Z510 Encounter for antineoplastic radiation therapy: Secondary | ICD-10-CM | POA: Diagnosis not present

## 2018-08-13 ENCOUNTER — Ambulatory Visit: Payer: Medicare Other

## 2018-08-13 ENCOUNTER — Other Ambulatory Visit: Payer: Self-pay

## 2018-08-13 ENCOUNTER — Ambulatory Visit
Admission: RE | Admit: 2018-08-13 | Discharge: 2018-08-13 | Disposition: A | Discharge status: Home / Self Care | Payer: Medicare Other | Source: Ambulatory Visit | Attending: Radiation Oncology | Admitting: Radiation Oncology

## 2018-08-13 DIAGNOSIS — Z510 Encounter for antineoplastic radiation therapy: Secondary | ICD-10-CM | POA: Diagnosis not present

## 2018-08-14 ENCOUNTER — Ambulatory Visit
Admission: RE | Admit: 2018-08-14 | Discharge: 2018-08-14 | Disposition: A | Discharge status: Home / Self Care | Payer: Medicare Other | Source: Ambulatory Visit | Attending: Radiation Oncology | Admitting: Radiation Oncology

## 2018-08-14 ENCOUNTER — Other Ambulatory Visit: Payer: Self-pay

## 2018-08-14 ENCOUNTER — Ambulatory Visit: Payer: Medicare Other

## 2018-08-14 DIAGNOSIS — Z510 Encounter for antineoplastic radiation therapy: Secondary | ICD-10-CM | POA: Diagnosis not present

## 2018-08-17 ENCOUNTER — Other Ambulatory Visit: Payer: Self-pay

## 2018-08-17 ENCOUNTER — Ambulatory Visit
Admission: RE | Admit: 2018-08-17 | Discharge: 2018-08-17 | Disposition: A | Discharge status: Home / Self Care | Payer: Medicare Other | Source: Ambulatory Visit | Attending: Radiation Oncology | Admitting: Radiation Oncology

## 2018-08-17 DIAGNOSIS — C07 Malignant neoplasm of parotid gland: Secondary | ICD-10-CM | POA: Diagnosis not present

## 2018-08-17 DIAGNOSIS — Z510 Encounter for antineoplastic radiation therapy: Secondary | ICD-10-CM | POA: Insufficient documentation

## 2018-08-18 ENCOUNTER — Other Ambulatory Visit: Payer: Self-pay

## 2018-08-18 ENCOUNTER — Ambulatory Visit
Admission: RE | Admit: 2018-08-18 | Discharge: 2018-08-18 | Disposition: A | Discharge status: Home / Self Care | Payer: Medicare Other | Source: Ambulatory Visit | Attending: Radiation Oncology | Admitting: Radiation Oncology

## 2018-08-18 DIAGNOSIS — Z510 Encounter for antineoplastic radiation therapy: Secondary | ICD-10-CM | POA: Diagnosis not present

## 2018-08-19 ENCOUNTER — Other Ambulatory Visit: Payer: Self-pay

## 2018-08-19 ENCOUNTER — Ambulatory Visit: Payer: Medicare Other

## 2018-08-19 ENCOUNTER — Ambulatory Visit
Admission: RE | Admit: 2018-08-19 | Discharge: 2018-08-19 | Disposition: A | Discharge status: Home / Self Care | Payer: Medicare Other | Source: Ambulatory Visit | Attending: Radiation Oncology | Admitting: Radiation Oncology

## 2018-08-19 DIAGNOSIS — Z510 Encounter for antineoplastic radiation therapy: Secondary | ICD-10-CM | POA: Diagnosis not present

## 2018-08-20 ENCOUNTER — Other Ambulatory Visit: Payer: Self-pay

## 2018-08-20 ENCOUNTER — Ambulatory Visit
Admission: RE | Admit: 2018-08-20 | Discharge: 2018-08-20 | Disposition: A | Discharge status: Home / Self Care | Payer: Medicare Other | Source: Ambulatory Visit | Attending: Radiation Oncology | Admitting: Radiation Oncology

## 2018-08-20 DIAGNOSIS — Z510 Encounter for antineoplastic radiation therapy: Secondary | ICD-10-CM | POA: Diagnosis not present

## 2018-08-21 ENCOUNTER — Ambulatory Visit
Admission: RE | Admit: 2018-08-21 | Discharge: 2018-08-21 | Disposition: A | Discharge status: Home / Self Care | Payer: Medicare Other | Source: Ambulatory Visit | Attending: Radiation Oncology | Admitting: Radiation Oncology

## 2018-08-21 ENCOUNTER — Other Ambulatory Visit: Payer: Self-pay

## 2018-08-21 ENCOUNTER — Ambulatory Visit: Payer: Medicare Other

## 2018-08-21 DIAGNOSIS — Z510 Encounter for antineoplastic radiation therapy: Secondary | ICD-10-CM | POA: Diagnosis not present

## 2018-08-24 ENCOUNTER — Ambulatory Visit
Admission: RE | Admit: 2018-08-24 | Discharge: 2018-08-24 | Disposition: A | Discharge status: Home / Self Care | Payer: Medicare Other | Source: Ambulatory Visit | Attending: Radiation Oncology | Admitting: Radiation Oncology

## 2018-08-24 ENCOUNTER — Ambulatory Visit: Payer: Medicare Other

## 2018-08-24 ENCOUNTER — Other Ambulatory Visit: Payer: Self-pay

## 2018-08-24 DIAGNOSIS — Z510 Encounter for antineoplastic radiation therapy: Secondary | ICD-10-CM | POA: Diagnosis not present

## 2018-08-24 NOTE — Progress Notes (Signed)
[place]   [phone] Fax:([phone]) [phone]   Clinic Follow up Note   Patient Care Team: [name], MD as PCP - General [name], MD as Consulting Physician (General Surgery) Truitt Merle, MD as Consulting Physician (Hematology) Kyung Rudd, MD as Consulting Physician (Radiation Oncology) Delice Bison, Charlestine Massed, NP as Nurse Practitioner (Hematology and Oncology) Eppie Gibson, MD as Attending Physician (Radiation Oncology) Jerrell Belfast, MD as Consulting Physician (Otolaryngology) Francina Ames, MD as Referring Physician (Otolaryngology) Leota Sauers, RN as Oncology Nurse Navigator   Date of Service:  08/26/2018   CHIEF COMPLAINT: F/u of right breast cancer  SUMMARY OF ONCOLOGIC HISTORY: Oncology History   Cancer Staging Malignant neoplasm of lower-inner quadrant of right breast of female, estrogen receptor positive ([place]) Staging form: Breast, AJCC 8th Edition - Clinical stage from 07/16/2017: Stage IA (cT1b, cN0, cM0, G2, ER+, PR+, HER2-) - Signed by Truitt Merle, MD on 07/23/2017       Malignant neoplasm of lower-inner quadrant of right breast of female, estrogen receptor positive ([place])   07/15/2017 Mammogram    IMPRESSION: The new irregular lesion in the right breast is indeterminate. An ultrasound is recommended.     07/15/2017 Imaging    [place] R Breast and Axilla 07/15/17 IMPRESSION:  The 6 mm mass in the right breast is suspicious for malignancy. A biopsy is recommended.     07/16/2017 Cancer Staging    Staging form: Breast, AJCC 8th Edition - Clinical stage from 07/16/2017: Stage IA (cT1b, cN0, cM0, G2, ER+, PR+, HER2-) - Signed by Truitt Merle, MD on 07/23/2017    07/16/2017 Initial Biopsy    Diagnosis 07/16/17 Breast, right, needle core biopsy, 3 o'clock, 7cm - INVASIVE DUCTAL CARCINOMA, SEE COMMENT. - DUCTAL CARCINOMA IN SITU WITH NECROSIS.    07/16/2017 Receptors her2    Estrogen Receptor: 100%, POSITIVE, STRONG STAINING INTENSITY  Progesterone Receptor: 80%, POSITIVE, STRONG STAINING INTENSITY Proliferation Marker Ki67: 10% HER2 - NEGATIVE    07/22/2017 Initial Diagnosis    Malignant neoplasm of lower-inner quadrant of right breast of female, estrogen receptor positive ([place])    07/30/2017 Surgery    BREAST LUMPECTOMY WITH RADIOACTIVE SEED AND SENTINEL LYMPH NODE BIOPSY by Dr. Barry Dienes  07/30/17     07/30/2017 Pathology Results    Diagnosis 07/30/17  1. Breast, lumpectomy, right - INVASIVE DUCTAL CARCINOMA, GRADE 2, SPANNING 0.6 CM. - INTERMEDIATE GRADE DUCTAL CARCINOMA IN SITU. - FINAL RESECTION MARGINS ARE NEGATIVE. - BIOPSY SITE. - SEE ONCOLOGY TABLE. 2. Breast, excision, right additional lateral margin - FIBROCYSTIC CHANGE. - NO MALIGNANCY IDENTIFIED. 3. Lymph node, sentinel, biopsy, right axillary #1 - ONE OF ONE LYMPH NODES NEGATIVE FOR CARCINOMA (0/1). 4. Lymph node, sentinel, biopsy, right axillary #2 - ONE OF ONE LYMPH NODES NEGATIVE FOR CARCINOMA (0/1). 5. Lymph node, sentinel, biopsy, right axillary #3 - ONE OF ONE LYMPH NODES NEGATIVE FOR CARCINOMA (0/1). 6. Lymph node, sentinel, biopsy, right axillary #4 - ONE OF ONE LYMPH NODES NEGATIVE FOR CARCINOMA (0/1).    07/30/2017 Oncotype testing    Oncotype 07/30/17 Recurrence score 20 with a 6% risk of distant recurrence on Tamoxifen alone and a <1% benefit of chemotherapy     09/01/2017 - 09/29/2017 Radiation Therapy    Radiation with Dr. Lisbeth Renshaw  09/01/17 and  completed on 09/29/17.     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 See comment.    06/29/2018 Surgery    Left total parotidectomy     06/29/2018 Pathology Results    PROCEDURE: Parotidectomy (completion), TUMOR SITE: Parotid gland, deep lobe TUMOR LATERALITY:  Left TUMOR FOCALITY: Unifocal TUMOR SIZE:    GREATEST DIMENSION: 4.0 cm    ADDITIONAL DIMENSIONS: 3.1 x 2.6 cm HISTOLOGIC TYPE: Salivary duct carcinoma TUMOR EXTENSION: Tumor is confined to the parotid gland MARGINS: Focally involved by carcinoma LYMPHOVASCULAR INVASION: present PERINERUAL INVASION: Not identified REGIONAL LYMPH NODES: No regional lymph nodes submitted or found PATHOLOGIC STAGE CLASSIFICATION (pTNM, AJCC 8th Ed): pT2 pNX DISTANT METASTASIS (pM): N/A ADDITIONAL PATHOLOGIC FINDINGS: None    06/29/2018 Cancer Staging    Staging form: Major Salivary Glands, AJCC 8th Edition - Pathologic stage from 06/29/2018: Stage Unknown (pT2, pNX, cM0) - Signed by Tish Men, MD on 07/13/2018    07/13/2018 Initial Diagnosis    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 Isidore Moos 08/05/18-09/22/18      CURRENT THERAPY:  Anastrozole 12m daily starting 10/2017  INTERVAL HISTORY:  Christina CORTNERis here for a follow up of right breast cancer. She presents to the clinic alone. She notes she is tolerating anastrozole well. She is also tolerating radiation well. She notes mild joint pain in her hands. She is not sure why a Hep C panel was requested by her PCP. She denies having any liver issues. She will consider it.    REVIEW OF SYSTEMS:   Constitutional: Denies fevers, chills or abnormal weight loss Eyes: Denies blurriness of vision Ears, nose, mouth, throat, and face: Denies mucositis or sore throat Respiratory: Denies cough, dyspnea or wheezes Cardiovascular: Denies palpitation, chest discomfort or lower extremity swelling Gastrointestinal:  Denies nausea, heartburn or change in bowel habits Skin: Denies abnormal skin rashes MSK: (+) Mild joint pain in hands  Lymphatics: Denies new lymphadenopathy or easy bruising Neurological:Denies numbness, tingling or new weaknesses Behavioral/Psych: Mood is stable, no new changes  All other  systems were reviewed with the patient and are negative.  MEDICAL HISTORY:  Past Medical History:  Diagnosis Date  . Arthritis   . Cancer ([place] 06/2017   right breast cancer  . GERD (gastroesophageal reflux disease)    mild- TUMS used sparingly  . Hyperlipidemia   . Hypertension   . PONV (postoperative nausea and vomiting)    also "passed out with cyst removal from neck"superficial"  . Syncope    x2 episodes-evaluated and findings normal- "usually related to seeing blood or thought of it"  . Vaso vagal episode    easily    SURGICAL HISTORY: Past Surgical History:  Procedure Laterality Date  . APPENDECTOMY    . BREAST LUMPECTOMY WITH RADIOACTIVE SEED AND SENTINEL LYMPH NODE BIOPSY Right 07/30/2017   Procedure: BREAST LUMPECTOMY WITH RADIOACTIVE SEED AND SENTINEL LYMPH NODE BIOPSY;  Surgeon: BStark Klein MD;  [place] [facility]  Service: General;  Laterality: Right;  . BREAST SURGERY  08/27/11   right lumpectomy w/needle localization  . BUNIONECTOMY Bilateral 2000   bilateral -hardware remains  . CYST REMOVAL NECK     sebaceous  cyst from neck  . DILATION AND CURETTAGE OF UTERUS    . INTRAUTERINE DEVICE INSERTION    . IUD REMOVAL    . LAPAROSCOPIC APPENDECTOMY N/A 03/27/2017   Procedure: APPENDECTOMY LAPAROSCOPIC;  Surgeon: Alphonsa Overall, MD;  Location: [facility];  Service: General;  Laterality: N/A;  . PAROTIDECTOMY Left 11/28/2017   SUPERFICIAL  . PAROTIDECTOMY Left 11/28/2017   Procedure: LEFT SUPEFICIAL PAROTIDECTOMY WITH OBSERVATION;  Surgeon: Jerrell Belfast, MD;  Location: [place];  Service: ENT;  Laterality: Left;  . PAROTIDECTOMY Left 06/29/2018   Dr[facility] at [facility]. Deep parotidectomy  . Removal Right Straphenous vein  2011  . TONSILLECTOMY     child  . TOTAL HIP ARTHROPLASTY Right 11/29/2015   Procedure: RIGHT TOTAL HIP ARTHROPLASTY ANTERIOR APPROACH;  Surgeon: Gaynelle Arabian, MD;  Location: [facility];  Service: Orthopedics;   Laterality: Right;    I have reviewed the social history and family history with the patient and they are unchanged from previous note.  ALLERGIES:  is allergic to dermacinrx surgical pharmapak and iohexol.  MEDICATIONS:  Current Outpatient Medications  Medication Sig Dispense Refill  . anastrozole (ARIMIDEX) 1 MG tablet TAKE 1 TABLET(1 MG) BY MOUTH DAILY 90 tablet 3  . atorvastatin (LIPITOR) 80 MG tablet Take 80 mg by mouth every evening.    . Calcium-Phosphorus-Vitamin D (CITRACAL +D3 PO) Take 1 tablet by mouth daily.    . Cholecalciferol (VITAMIN D3) 2000 units TABS Take 2,000 Units by mouth daily.    Marland Kitchen FINACEA 15 % cream Apply 1 application topically daily. Applied to nose.  0  . LUTEIN-ZEAXANTHIN PO Take 1 tablet by mouth daily.    Marland Kitchen MICARDIS HCT 40-12.5 MG per tablet Take 1 tablet by mouth daily.     Marland Kitchen ZETIA 10 MG tablet Take 10 mg by mouth every evening.      No current facility-administered medications for this visit.     PHYSICAL EXAMINATION: ECOG PERFORMANCE STATUS: 0 - Asymptomatic  No vitals taken today, Exam not performed today  Vitals:   08/26/18 1005  BP: 135/81  Pulse: 79  Resp: 18  Temp: 98.5 F (36.9 C)  SpO2: 98%   Filed Weights   08/26/18 1005  Weight: 182 lb 9.6 oz (82.8 kg)   GENERAL:alert, no distress and comfortable SKIN: skin color, texture, turgor are normal, no rashes or significant lesions EYES: normal, Conjunctiva are pink and non-injected, sclera clear LUNGS: clear to auscultation and percussion with normal breathing effort HEART: regular rate & rhythm and no murmurs and no lower extremity edema ABDOMEN:abdomen soft, non-tender and normal bowel sounds Musculoskeletal:no cyanosis of digits and no clubbing  NEURO: alert & oriented x 3 with fluent speech, no focal motor/sensory deficits BREAST Exam deferred today   LABORATORY DATA:  I have reviewed the data as listed CBC Latest Ref Rng & Units 08/26/2018 04/27/2018 12/24/2017  WBC 4.0 -  10.5 K/uL 5.2 6.8 6.5  Hemoglobin 12.0 - 15.0 g/dL 13.5 14.3 14.3  Hematocrit 36.0 - 46.0 % 41.0 43.1 42.4  Platelets 150 - 400 K/uL 189 210 204     CMP Latest Ref Rng & Units 08/26/2018 04/27/2018 12/24/2017  Glucose 70 - 99 mg/dL 96 88 90  BUN 8 - 23 mg/dL _0 Creatinine 0.44 - 1.00 mg/dL 0.83 0.85 0.82  Sodium 135 - 145 mmol/L 139 141 140  Potassium 3.5 - 5.1 mmol/L 4.2 3.9 3.7  Chloride 98 - 111 mmol/L 103 104 103  CO2 22 - 32 mmol/L _1 Calcium 8.9 - 10.3 mg/dL 9.8 9.8 9.9  Total Protein 6.5 - 8.1 g/dL 7.2 7.5 7.7  Total Bilirubin 0.3 - 1.2 mg/dL 0.9 1.0 1.2  Alkaline Phos 38 - 126 U/L 66 68 69  AST 15 - 41 U/L _0 ALT 0 - 44 U/L _1 RADIOGRAPHIC STUDIES: I have personally reviewed the radiological images as listed and agreed with the findings in the report. No results found.   ASSESSMENT & PLAN:  CARLEIGH BUCCIERI is a 73 y.o. female with   1. 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 She is s/p right breast lumpectomy and adjuvant radiation.  -Her Oncotype score was 20 with 9-year distant recurrence risk of 6% with tamoxifen or AI. I do not recommend adjuvant chemotherapy.  -She started antiestrogen therapy with anastrozole in 10/2017. She is tolerating moderately well with manageable joint pain in hands, will continue for 5 to 7 years. -She is clinically doing well. Lab reviewed, her CBC and CMP are within normal limits except. Patient notes her breast mammogram in 08/2018 was benign. There is no clinical concern for recurrence -Continue anastrozole  -F/u in 6 months.   2. Osteopenia -She notes having a fracture in early 2019 with little physical cause which is a concern for osteoporosis  -DEXA from 05/2017 with lowest T-score of -1.3 at left femur neck.   -She is on anastrozole which we discussed can  weaken her bone.  -If her bone density worsens at next DEXA we will discuss starting bisphosphonate.  -She will continue Calcium and Vitamin D    3. Left Parotid gland carcinoma -She had parotidectomy with Dr. Wilburn Cornelia recently. She is recovering well. -Initial biopsy benign in 11/2017. Repeated in 05/2009 was positive for carcinoma. -She underwent parotidectomy on 06/29/18 which showed salivary duct carcinoma, margin was positive.  -I referred her to my colleague Dr. Maylon Peppers who is specialized in head and neck cancer but pt declined the appointment. Dr. Maylon Peppers reviewed her case. He did not recommend adjuvant chemotherapy. I discussed with her.  -She is currently undergoing adjuvant radiation with Dr. Isidore Moos 08/05/18-09/22/18, tolerating well.     4. HTN -Continue medication and follow-up with PCP   PLAN:  Lab and f/u 6 months  Continue anastrozole    No problem-specific Assessment & Plan notes found for this encounter.   No orders of the defined types were placed in this encounter.  All questions were answered. The patient knows to call the clinic with any problems, questions or concerns. No barriers to learning was detected. I spent 15 minutes counseling the patient face to face. The total time spent in the appointment was 20 minutes and more than 50% was on counseling and review of test results    Truitt Merle, MD 08/26/2018   I, Joslyn Devon, am acting as scribe for Truitt Merle, MD.   I have reviewed the above documentation for accuracy and completeness, and I agree with the above.

## 2018-08-25 ENCOUNTER — Ambulatory Visit
Admission: RE | Admit: 2018-08-25 | Discharge: 2018-08-25 | Disposition: A | Discharge status: Home / Self Care | Payer: Medicare Other | Source: Ambulatory Visit | Attending: Radiation Oncology | Admitting: Radiation Oncology

## 2018-08-25 ENCOUNTER — Other Ambulatory Visit: Payer: Self-pay

## 2018-08-25 DIAGNOSIS — Z510 Encounter for antineoplastic radiation therapy: Secondary | ICD-10-CM | POA: Diagnosis not present

## 2018-08-26 ENCOUNTER — Other Ambulatory Visit: Payer: Self-pay

## 2018-08-26 ENCOUNTER — Inpatient Hospital Stay (HOSPITAL_BASED_OUTPATIENT_CLINIC_OR_DEPARTMENT_OTHER): Payer: Medicare Other | Admitting: Hematology

## 2018-08-26 ENCOUNTER — Encounter: Payer: Self-pay | Admitting: Hematology

## 2018-08-26 ENCOUNTER — Inpatient Hospital Stay: Payer: Medicare Other | Attending: Hematology

## 2018-08-26 ENCOUNTER — Ambulatory Visit
Admission: RE | Admit: 2018-08-26 | Discharge: 2018-08-26 | Disposition: A | Discharge status: Home / Self Care | Payer: Medicare Other | Source: Ambulatory Visit | Attending: Radiation Oncology | Admitting: Radiation Oncology

## 2018-08-26 ENCOUNTER — Ambulatory Visit: Payer: Medicare Other

## 2018-08-26 VITALS — BP 135/81 | HR 79 | Temp 98.5°F | Resp 18 | Ht 65.0 in | Wt 182.6 lb

## 2018-08-26 DIAGNOSIS — C50311 Malignant neoplasm of lower-inner quadrant of right female breast: Secondary | ICD-10-CM

## 2018-08-26 DIAGNOSIS — Z170 Estrogen receptor positive status [ER+]: Secondary | ICD-10-CM | POA: Insufficient documentation

## 2018-08-26 DIAGNOSIS — Z79811 Long term (current) use of aromatase inhibitors: Secondary | ICD-10-CM

## 2018-08-26 DIAGNOSIS — C089 Malignant neoplasm of major salivary gland, unspecified: Secondary | ICD-10-CM | POA: Diagnosis not present

## 2018-08-26 DIAGNOSIS — Z510 Encounter for antineoplastic radiation therapy: Secondary | ICD-10-CM | POA: Diagnosis not present

## 2018-08-26 DIAGNOSIS — E785 Hyperlipidemia, unspecified: Secondary | ICD-10-CM | POA: Diagnosis not present

## 2018-08-26 DIAGNOSIS — Z923 Personal history of irradiation: Secondary | ICD-10-CM | POA: Insufficient documentation

## 2018-08-26 DIAGNOSIS — I10 Essential (primary) hypertension: Secondary | ICD-10-CM | POA: Insufficient documentation

## 2018-08-26 DIAGNOSIS — C07 Malignant neoplasm of parotid gland: Secondary | ICD-10-CM

## 2018-08-26 DIAGNOSIS — M1990 Unspecified osteoarthritis, unspecified site: Secondary | ICD-10-CM

## 2018-08-26 DIAGNOSIS — Z79899 Other long term (current) drug therapy: Secondary | ICD-10-CM | POA: Diagnosis not present

## 2018-08-26 LAB — CMP (CANCER CENTER ONLY)
ALT: 21 U/L (ref 0–44)
AST: 21 U/L (ref 15–41)
Albumin: 4.3 g/dL (ref 3.5–5.0)
Alkaline Phosphatase: 66 U/L (ref 38–126)
Anion gap: 9 (ref 5–15)
BUN: 12 mg/dL (ref 8–23)
CO2: 27 mmol/L (ref 22–32)
Calcium: 9.8 mg/dL (ref 8.9–10.3)
Chloride: 103 mmol/L (ref 98–111)
Creatinine: 0.83 mg/dL (ref 0.44–1.00)
GFR, Est AFR Am: >60 mL/min (ref >60)
GFR, Estimated: >60 mL/min (ref >60)
Glucose, Bld: 96 mg/dL (ref 70–99)
Potassium: 4.2 mmol/L (ref 3.5–5.1)
Sodium: 139 mmol/L (ref 135–145)
Total Bilirubin: 0.9 mg/dL (ref 0.3–1.2)
Total Protein: 7.2 g/dL (ref 6.5–8.1)

## 2018-08-26 LAB — CBC WITH DIFFERENTIAL (CANCER CENTER ONLY)
Abs Immature Granulocytes: 0.01 10*3/uL (ref 0.00–0.07)
Basophils Absolute: 0 10*3/uL (ref 0.0–0.1)
Basophils Relative: 1 %
Eosinophils Absolute: 0.1 10*3/uL (ref 0.0–0.5)
Eosinophils Relative: 2 %
HCT: 41 % (ref 36.0–46.0)
Hemoglobin: 13.5 g/dL (ref 12.0–15.0)
Immature Granulocytes: 0 %
Lymphocytes Relative: 13 %
Lymphs Abs: 0.7 10*3/uL (ref 0.7–4.0)
MCH: 31.9 pg (ref 26.0–34.0)
MCHC: 32.9 g/dL (ref 30.0–36.0)
MCV: 96.9 fL (ref 80.0–100.0)
Monocytes Absolute: 0.4 10*3/uL (ref 0.1–1.0)
Monocytes Relative: 8 %
Neutro Abs: 3.9 10*3/uL (ref 1.7–7.7)
Neutrophils Relative %: 76 %
Platelet Count: 189 10*3/uL (ref 150–400)
RBC: 4.23 MIL/uL (ref 3.87–5.11)
RDW: 13 % (ref 11.5–15.5)
WBC Count: 5.2 10*3/uL (ref 4.0–10.5)
nRBC: 0 % (ref 0.0–0.2)

## 2018-08-27 ENCOUNTER — Other Ambulatory Visit: Payer: Self-pay

## 2018-08-27 ENCOUNTER — Telehealth: Payer: Self-pay | Admitting: Hematology

## 2018-08-27 ENCOUNTER — Ambulatory Visit
Admission: RE | Admit: 2018-08-27 | Discharge: 2018-08-27 | Disposition: A | Discharge status: Home / Self Care | Payer: Medicare Other | Source: Ambulatory Visit | Attending: Radiation Oncology | Admitting: Radiation Oncology

## 2018-08-27 DIAGNOSIS — Z510 Encounter for antineoplastic radiation therapy: Secondary | ICD-10-CM | POA: Diagnosis not present

## 2018-08-27 NOTE — Telephone Encounter (Signed)
Scheduled appt per 6/10 los. Was not able to reach the patient, a calendar will be mailed out. °

## 2018-08-28 ENCOUNTER — Other Ambulatory Visit: Payer: Self-pay

## 2018-08-28 ENCOUNTER — Ambulatory Visit
Admission: RE | Admit: 2018-08-28 | Discharge: 2018-08-28 | Disposition: A | Discharge status: Home / Self Care | Payer: Medicare Other | Source: Ambulatory Visit | Attending: Radiation Oncology | Admitting: Radiation Oncology

## 2018-08-28 DIAGNOSIS — Z510 Encounter for antineoplastic radiation therapy: Secondary | ICD-10-CM | POA: Diagnosis not present

## 2018-08-31 ENCOUNTER — Ambulatory Visit: Payer: Medicare Other

## 2018-08-31 ENCOUNTER — Other Ambulatory Visit: Payer: Self-pay

## 2018-08-31 ENCOUNTER — Ambulatory Visit
Admission: RE | Admit: 2018-08-31 | Discharge: 2018-08-31 | Disposition: A | Discharge status: Home / Self Care | Payer: Medicare Other | Source: Ambulatory Visit | Attending: Radiation Oncology | Admitting: Radiation Oncology

## 2018-08-31 DIAGNOSIS — Z510 Encounter for antineoplastic radiation therapy: Secondary | ICD-10-CM | POA: Diagnosis not present

## 2018-09-01 ENCOUNTER — Other Ambulatory Visit: Payer: Self-pay

## 2018-09-01 ENCOUNTER — Ambulatory Visit
Admission: RE | Admit: 2018-09-01 | Discharge: 2018-09-01 | Disposition: A | Discharge status: Home / Self Care | Payer: Medicare Other | Source: Ambulatory Visit | Attending: Radiation Oncology | Admitting: Radiation Oncology

## 2018-09-01 DIAGNOSIS — Z510 Encounter for antineoplastic radiation therapy: Secondary | ICD-10-CM | POA: Diagnosis not present

## 2018-09-02 ENCOUNTER — Other Ambulatory Visit: Payer: Self-pay

## 2018-09-02 ENCOUNTER — Ambulatory Visit
Admission: RE | Admit: 2018-09-02 | Discharge: 2018-09-02 | Disposition: A | Discharge status: Home / Self Care | Payer: Medicare Other | Source: Ambulatory Visit | Attending: Radiation Oncology | Admitting: Radiation Oncology

## 2018-09-02 ENCOUNTER — Ambulatory Visit: Payer: Medicare Other

## 2018-09-02 DIAGNOSIS — Z510 Encounter for antineoplastic radiation therapy: Secondary | ICD-10-CM | POA: Diagnosis not present

## 2018-09-03 ENCOUNTER — Other Ambulatory Visit: Payer: Self-pay

## 2018-09-03 ENCOUNTER — Ambulatory Visit: Payer: Medicare Other

## 2018-09-03 ENCOUNTER — Ambulatory Visit
Admission: RE | Admit: 2018-09-03 | Discharge: 2018-09-03 | Disposition: A | Discharge status: Home / Self Care | Payer: Medicare Other | Source: Ambulatory Visit | Attending: Radiation Oncology | Admitting: Radiation Oncology

## 2018-09-03 DIAGNOSIS — Z510 Encounter for antineoplastic radiation therapy: Secondary | ICD-10-CM | POA: Diagnosis not present

## 2018-09-04 ENCOUNTER — Ambulatory Visit
Admission: RE | Admit: 2018-09-04 | Discharge: 2018-09-04 | Disposition: A | Discharge status: Home / Self Care | Payer: Medicare Other | Source: Ambulatory Visit | Attending: Radiation Oncology | Admitting: Radiation Oncology

## 2018-09-04 ENCOUNTER — Other Ambulatory Visit: Payer: Self-pay

## 2018-09-04 DIAGNOSIS — Z510 Encounter for antineoplastic radiation therapy: Secondary | ICD-10-CM | POA: Diagnosis not present

## 2018-09-07 ENCOUNTER — Ambulatory Visit
Admission: RE | Admit: 2018-09-07 | Discharge: 2018-09-07 | Disposition: A | Discharge status: Home / Self Care | Payer: Medicare Other | Source: Ambulatory Visit | Attending: Radiation Oncology | Admitting: Radiation Oncology

## 2018-09-07 ENCOUNTER — Other Ambulatory Visit: Payer: Self-pay

## 2018-09-07 DIAGNOSIS — Z510 Encounter for antineoplastic radiation therapy: Secondary | ICD-10-CM | POA: Diagnosis not present

## 2018-09-08 ENCOUNTER — Ambulatory Visit
Admission: RE | Admit: 2018-09-08 | Discharge: 2018-09-08 | Disposition: A | Discharge status: Home / Self Care | Payer: Medicare Other | Source: Ambulatory Visit | Attending: Radiation Oncology | Admitting: Radiation Oncology

## 2018-09-08 ENCOUNTER — Other Ambulatory Visit: Payer: Self-pay

## 2018-09-08 DIAGNOSIS — Z510 Encounter for antineoplastic radiation therapy: Secondary | ICD-10-CM | POA: Diagnosis not present

## 2018-09-09 ENCOUNTER — Ambulatory Visit
Admission: RE | Admit: 2018-09-09 | Discharge: 2018-09-09 | Disposition: A | Discharge status: Home / Self Care | Payer: Medicare Other | Source: Ambulatory Visit | Attending: Radiation Oncology | Admitting: Radiation Oncology

## 2018-09-09 ENCOUNTER — Other Ambulatory Visit: Payer: Self-pay

## 2018-09-09 DIAGNOSIS — Z510 Encounter for antineoplastic radiation therapy: Secondary | ICD-10-CM | POA: Diagnosis not present

## 2018-09-10 ENCOUNTER — Other Ambulatory Visit: Payer: Self-pay

## 2018-09-10 ENCOUNTER — Ambulatory Visit
Admission: RE | Admit: 2018-09-10 | Discharge: 2018-09-10 | Disposition: A | Discharge status: Home / Self Care | Payer: Medicare Other | Source: Ambulatory Visit | Attending: Radiation Oncology | Admitting: Radiation Oncology

## 2018-09-10 DIAGNOSIS — Z510 Encounter for antineoplastic radiation therapy: Secondary | ICD-10-CM | POA: Diagnosis not present

## 2018-09-11 ENCOUNTER — Ambulatory Visit
Admission: RE | Admit: 2018-09-11 | Discharge: 2018-09-11 | Disposition: A | Discharge status: Home / Self Care | Payer: Medicare Other | Source: Ambulatory Visit | Attending: Radiation Oncology | Admitting: Radiation Oncology

## 2018-09-11 ENCOUNTER — Other Ambulatory Visit: Payer: Self-pay

## 2018-09-11 DIAGNOSIS — Z510 Encounter for antineoplastic radiation therapy: Secondary | ICD-10-CM | POA: Diagnosis not present

## 2018-09-14 ENCOUNTER — Ambulatory Visit: Payer: Medicare Other

## 2018-09-14 ENCOUNTER — Other Ambulatory Visit: Payer: Self-pay

## 2018-09-14 ENCOUNTER — Ambulatory Visit
Admission: RE | Admit: 2018-09-14 | Discharge: 2018-09-14 | Disposition: A | Discharge status: Home / Self Care | Payer: Medicare Other | Source: Ambulatory Visit | Attending: Radiation Oncology | Admitting: Radiation Oncology

## 2018-09-14 DIAGNOSIS — Z510 Encounter for antineoplastic radiation therapy: Secondary | ICD-10-CM | POA: Diagnosis not present

## 2018-09-15 ENCOUNTER — Other Ambulatory Visit: Payer: Self-pay

## 2018-09-15 ENCOUNTER — Ambulatory Visit: Payer: Medicare Other

## 2018-09-15 ENCOUNTER — Ambulatory Visit
Admission: RE | Admit: 2018-09-15 | Discharge: 2018-09-15 | Disposition: A | Discharge status: Home / Self Care | Payer: Medicare Other | Source: Ambulatory Visit | Attending: Radiation Oncology | Admitting: Radiation Oncology

## 2018-09-15 DIAGNOSIS — Z510 Encounter for antineoplastic radiation therapy: Secondary | ICD-10-CM | POA: Diagnosis not present

## 2018-09-16 ENCOUNTER — Ambulatory Visit
Admission: RE | Admit: 2018-09-16 | Discharge: 2018-09-16 | Disposition: A | Discharge status: Home / Self Care | Payer: Medicare Other | Source: Ambulatory Visit | Attending: Radiation Oncology | Admitting: Radiation Oncology

## 2018-09-16 ENCOUNTER — Other Ambulatory Visit: Payer: Self-pay

## 2018-09-16 ENCOUNTER — Ambulatory Visit: Payer: Medicare Other

## 2018-09-16 DIAGNOSIS — Z510 Encounter for antineoplastic radiation therapy: Secondary | ICD-10-CM | POA: Diagnosis present

## 2018-09-16 DIAGNOSIS — C07 Malignant neoplasm of parotid gland: Secondary | ICD-10-CM | POA: Diagnosis not present

## 2018-09-17 ENCOUNTER — Ambulatory Visit
Admission: RE | Admit: 2018-09-17 | Discharge: 2018-09-17 | Disposition: A | Discharge status: Home / Self Care | Payer: Medicare Other | Source: Ambulatory Visit | Attending: Radiation Oncology | Admitting: Radiation Oncology

## 2018-09-17 ENCOUNTER — Other Ambulatory Visit: Payer: Self-pay

## 2018-09-17 DIAGNOSIS — Z510 Encounter for antineoplastic radiation therapy: Secondary | ICD-10-CM | POA: Diagnosis not present

## 2018-09-21 ENCOUNTER — Ambulatory Visit
Admission: RE | Admit: 2018-09-21 | Discharge: 2018-09-21 | Disposition: A | Discharge status: Home / Self Care | Payer: Medicare Other | Source: Ambulatory Visit | Attending: Radiation Oncology | Admitting: Radiation Oncology

## 2018-09-21 ENCOUNTER — Other Ambulatory Visit: Payer: Self-pay

## 2018-09-21 DIAGNOSIS — Z510 Encounter for antineoplastic radiation therapy: Secondary | ICD-10-CM | POA: Diagnosis not present

## 2018-09-22 ENCOUNTER — Encounter: Payer: Self-pay | Admitting: *Deleted

## 2018-09-22 ENCOUNTER — Ambulatory Visit
Admission: RE | Admit: 2018-09-22 | Discharge: 2018-09-22 | Disposition: A | Discharge status: Home / Self Care | Payer: Medicare Other | Source: Ambulatory Visit | Attending: Radiation Oncology | Admitting: Radiation Oncology

## 2018-09-22 ENCOUNTER — Encounter: Payer: Self-pay | Admitting: Radiation Oncology

## 2018-09-22 ENCOUNTER — Other Ambulatory Visit: Payer: Self-pay

## 2018-09-22 ENCOUNTER — Ambulatory Visit: Payer: Medicare Other

## 2018-09-22 DIAGNOSIS — Z510 Encounter for antineoplastic radiation therapy: Secondary | ICD-10-CM | POA: Diagnosis not present

## 2018-09-22 NOTE — Progress Notes (Signed)
Oncology Nurse Navigator Documentation  Met with [name] prior to final RT to offer support and to celebrate end of radiation treatment.   Provided verbal/written post-RT guidance:  Importance of keeping all follow-up appts, especially those with Nutrition and SLP.  Importance of protecting treatment area from sun.  Continuation of Sonafine application 2-3 times daily until supply exhausted after which transition to OTC lotion with vitamin E. Explained my role as navigator will continue for several more months and that I will be calling and/or joining her during follow-up visits.   I encouraged her to call me with needs/concerns.   She verbalized understanding of information provided.  Gayleen Orem, RN, BSN Head & Neck Oncology [facility] [place] [phone]

## 2018-10-09 ENCOUNTER — Other Ambulatory Visit: Payer: Self-pay | Admitting: Hematology

## 2018-10-09 ENCOUNTER — Ambulatory Visit: Payer: Self-pay | Admitting: Radiation Oncology

## 2018-10-15 NOTE — Progress Notes (Signed)
  Patient Name: [name] MRN: [id number] DOB: Feb 17, 1946 Referring Physician: Francina Ames (Profile Not Attached) Date of Service: 09/22/2018 [facility], [place]                                                        End Of Treatment Note  Diagnoses: C07-Malignant neoplasm of parotid gland   Cancer Staging Cancer of parotid gland [facility]) Staging form: Major Salivary Glands, AJCC 8th Edition - Pathologic stage from 06/28/2018: Stage Unknown (pT2, pNX, cM0) - Signed by Truitt Merle, MD on 08/25/2018  Malignant neoplasm of lower-inner quadrant of right breast of female, estrogen receptor positive (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 She experienced mild fatigue and developed taste changes which has decreased her appetite. She has tenderness to her radiation site and upper left neck/chest area. Her skin shows erythema and dry desquamation, as well as patchy alopecia in the treatment field. She continues to use aloe and Aquaphor.  Plan: The patient will follow-up with radiation oncology in one month.  ________________________________________________  Eppie Gibson, MD  This document  serves as a record of services personally performed by Eppie Gibson, MD. It was created on her behalf by Rae Lips, a trained medical scribe. The creation of this record is based on the scribe's personal observations and the provider's statements to them. This document has been checked and approved by the attending provider.

## 2018-11-05 ENCOUNTER — Encounter: Payer: Self-pay | Admitting: Radiation Oncology

## 2018-11-10 ENCOUNTER — Ambulatory Visit
Admission: RE | Admit: 2018-11-10 | Discharge: 2018-11-10 | Disposition: A | Discharge status: Home / Self Care | Payer: Medicare Other | Source: Ambulatory Visit | Attending: Radiation Oncology | Admitting: Radiation Oncology

## 2018-11-10 ENCOUNTER — Encounter: Payer: Self-pay | Admitting: Radiation Oncology

## 2018-11-10 ENCOUNTER — Other Ambulatory Visit: Payer: Self-pay

## 2018-11-10 VITALS — BP 143/84 | HR 93 | Temp 97.8°F | Resp 18 | Ht 65.0 in | Wt 177.4 lb

## 2018-11-10 DIAGNOSIS — Z923 Personal history of irradiation: Secondary | ICD-10-CM | POA: Diagnosis not present

## 2018-11-10 DIAGNOSIS — Z853 Personal history of malignant neoplasm of breast: Secondary | ICD-10-CM | POA: Diagnosis not present

## 2018-11-10 DIAGNOSIS — C07 Malignant neoplasm of parotid gland: Secondary | ICD-10-CM | POA: Diagnosis not present

## 2018-11-10 DIAGNOSIS — Z79899 Other long term (current) drug therapy: Secondary | ICD-10-CM | POA: Insufficient documentation

## 2018-11-10 HISTORY — DX: Personal history of irradiation: Z92.3

## 2018-11-10 NOTE — Progress Notes (Signed)
Radiation Oncology         [phone] ________________________________  Name: [name] MRN: [id number]  Date: 11/10/2018  DOB: [birth year]  Follow-Up Visit Note  CC: Christina Pillar, MD  Christina Ames, MD  Diagnosis and Prior Radiotherapy:       ICD-10-CM   1. Cancer of parotid gland ([place])  C07      08/05/2018 - 09/22/2018: Left Parotid / 66 Gy in 33 fractions  09/01/2017 - 09/29/2017: Right Breast +seroma boost / total of 50.56 Gy (Dr. Lisbeth [name])  CHIEF COMPLAINT:  Here for follow-up and surveillance of parotid gland cancer  Narrative:  The patient returns today for routine follow-up. She has not seen Dr. Nicolette [name] since May 2020. On review of systems, she has some itching inside her ear (left) and resolving sore throat.     [name] presents for follow up of radiation completed 09/22/18 to her left parotid area.   Pain issues, if any: She denies. She does report a sore throat since completing radiation therapy. She has gargled with salt, and used biotine mouth rinse to help this.  It is improving.  Using a feeding tube?: No Weight changes, if any:  Wt Readings from Last 3 Encounters:  11/10/18 177 lb 6 oz (80.5 kg)  08/26/18 182 lb 9.6 oz (82.8 kg)  04/27/18 186 lb 6.4 oz (84.6 kg)   Swallowing issues, if any: She denies.  Smoking or chewing tobacco? No Using fluoride trays daily? N/A Last ENT visit was on: 07/21/18 Dr. Nicolette [name].         ALLERGIES:  is allergic to dermacinrx surgical pharmapak and iohexol.  Meds: Current Outpatient Medications  Medication Sig Dispense Refill  . anastrozole (ARIMIDEX) 1 MG tablet TAKE 1 TABLET(1 MG) BY MOUTH DAILY 90 tablet 3  . atorvastatin (LIPITOR) 80 MG tablet Take 80 mg by mouth every evening.    . Calcium-Phosphorus-Vitamin D (CITRACAL +D3 PO) Take 1 tablet by mouth daily.    . Cholecalciferol (VITAMIN D3) 2000 units TABS Take 2,000 Units by mouth daily.    Marland Kitchen FINACEA 15 % cream Apply 1 application topically daily. Applied to  nose.  0  . LUTEIN-ZEAXANTHIN PO Take 1 tablet by mouth daily.    Marland Kitchen MICARDIS HCT 40-12.5 MG per tablet Take 1 tablet by mouth daily.     Marland Kitchen ZETIA 10 MG tablet Take 10 mg by mouth every evening.      No current facility-administered medications for this encounter.     Physical Findings: The patient is in no acute distress. Patient is alert and oriented. Wt Readings from Last 3 Encounters:  11/10/18 177 lb 6 oz (80.5 kg)  08/26/18 182 lb 9.6 oz (82.8 kg)  04/27/18 186 lb 6.4 oz (84.6 kg)    height is 5\' 5"  (1.651 m) and weight is 177 lb 6 oz (80.5 kg). Her temporal temperature is 97.8 F (36.6 C). Her blood pressure is 143/84 (abnormal) and her pulse is 93. Her respiration is 18 and oxygen saturation is 99%. .  General: Alert and oriented, in no acute distress  HEENT: no thrush; debris in ear canal present. Psychiatric: Judgment and insight are intact. Affect is appropriate. Skin: alopecia of scalp behind left ear, skin healing well.   Lab Findings: Lab Results  Component Value Date   WBC 5.2 08/26/2018   HGB 13.5 08/26/2018   HCT 41.0 08/26/2018   MCV 96.9 08/26/2018   PLT 189 08/26/2018    No results found for: TSH  Radiographic Findings: No results found.  Impression/Plan:    1) Head and Neck Cancer Status: Healing well from radiotherapy  2) Nutritional Status: No issues PEG tube: no  3) continue skin care to moisturize skin.  I expect her alopecia will improve over the next several months.  Symptoms of mucositis in throat is improving.  No abnormality noted on exam today and throat.  4) contrast allergy-she broke into hives when she received contrast earlier this year.  I will prescribe prednisone and Benadryl.  Ordering restaging CT of neck and chest to be done in early October.  5) Follow-up in early October after restaging scans - the patient was encouraged to call with any issues or questions before then.  I spent 10 minutes face to face with the patient and  more than 50% of that time was spent in counseling and/or coordination of care. _____________________________________   Eppie Gibson, MD  This document serves as a record of services personally performed by Eppie Gibson, MD. It was created on her behalf by [name], a trained medical scribe. The creation of this record is based on the scribe's personal observations and the provider's statements to them. This document has been checked and approved by the attending provider.

## 2018-11-10 NOTE — Progress Notes (Signed)
[name] presents for follow up of radiation completed 09/22/18 to her left parotid area.   Pain issues, if any: She denies. She does report a sore throat since completing radiation therapy. She has gargled with salt, and used biotine mouth rinse to help this.  Using a feeding tube?: No Weight changes, if any:  Wt Readings from Last 3 Encounters:  11/10/18 177 lb 6 oz (80.5 kg)  08/26/18 182 lb 9.6 oz (82.8 kg)  04/27/18 186 lb 6.4 oz (84.6 kg)   Swallowing issues, if any: She denies.  Smoking or chewing tobacco? No Using fluoride trays daily? N/A Last ENT visit was on: 07/21/18 Dr. Nicolette Bang.  Other notable issues, if any:   BP (!) 143/84 (BP Location: Left Arm, Patient Position: Sitting)   Pulse 93   Temp 97.8 F (36.6 C) (Temporal)   Resp 18   Ht 5\' 5"  (1.651 m)   Wt 177 lb 6 oz (80.5 kg)   SpO2 99%   BMI 29.52 kg/m

## 2018-11-11 ENCOUNTER — Other Ambulatory Visit: Payer: Self-pay | Admitting: Radiation Oncology

## 2018-11-11 DIAGNOSIS — C07 Malignant neoplasm of parotid gland: Secondary | ICD-10-CM

## 2018-11-11 MED ORDER — PREDNISONE 50 MG PO TABS: ORAL_TABLET | ORAL | 0 refills | Status: DC

## 2018-11-11 MED ORDER — DIPHENHYDRAMINE HCL 50 MG PO TABS: ORAL_TABLET | ORAL | 0 refills | Status: DC

## 2018-12-14 ENCOUNTER — Telehealth: Payer: Self-pay | Admitting: *Deleted

## 2018-12-14 NOTE — Telephone Encounter (Signed)
CALLED PATIENT TO INFORM OF STAT LABS ON 12-17-18 @ 11:15 AM @ [facility] AND HER CT ON 12-17-18 - ARRIVAL TIME- 12:15 PM @ [facility], PATIENT TO NOT HAVE ANY FOOD - 4 HRS. PRIOR TO TEST, PATIENT TO RECEIVE RESULTS FROM DR. SQUIRE ON 12-18-18 @ 2:40 PM, LVM FOR A RETURN CALL

## 2018-12-15 ENCOUNTER — Telehealth: Payer: Self-pay | Admitting: *Deleted

## 2018-12-15 NOTE — Telephone Encounter (Signed)
Returned patient's phone call, lvm for a return call 

## 2018-12-16 ENCOUNTER — Telehealth: Payer: Self-pay | Admitting: *Deleted

## 2018-12-16 ENCOUNTER — Encounter: Payer: Self-pay | Admitting: Radiation Oncology

## 2018-12-16 NOTE — Telephone Encounter (Signed)
RETURNED PATIENT'S PHONE CALL, SPOKE WITH PATIENT. ?

## 2018-12-16 NOTE — Progress Notes (Signed)
[name] presents for follow up of radiation completed 09/22/18 to her left parotid area.     Pain issues, if any: She denies Using a feeding tube?: N/A Weight changes, if any:  Wt Readings from Last 3 Encounters:  12/18/18 182 lb (82.6 kg)  11/10/18 177 lb 6 oz (80.5 kg)  08/26/18 182 lb 9.6 oz (82.8 kg)   Swallowing issues, if any: She denies.  Smoking or chewing tobacco? No Using fluoride trays daily? No Last ENT visit was on: Not since diagnosis.  Other notable issues, if any:  She presents for results of CT neck and chest completed 12/17/18.   BP (!) 154/83 (BP Location: Left Arm, Patient Position: Sitting)   Pulse 92   Temp 98.5 F (36.9 C) (Temporal)   Resp 18   Ht 5\' 5"  (1.651 m)   Wt 182 lb (82.6 kg)   SpO2 99%   BMI 30.29 kg/m

## 2018-12-17 ENCOUNTER — Other Ambulatory Visit: Payer: Self-pay

## 2018-12-17 ENCOUNTER — Ambulatory Visit (HOSPITAL_COMMUNITY)
Admission: RE | Admit: 2018-12-17 | Discharge: 2018-12-17 | Disposition: A | Discharge status: Home / Self Care | Payer: Medicare Other | Source: Ambulatory Visit | Attending: Radiation Oncology | Admitting: Radiation Oncology

## 2018-12-17 ENCOUNTER — Ambulatory Visit
Admission: RE | Admit: 2018-12-17 | Discharge: 2018-12-17 | Disposition: A | Discharge status: Home / Self Care | Payer: Medicare Other | Source: Ambulatory Visit | Attending: Radiation Oncology | Admitting: Radiation Oncology

## 2018-12-17 DIAGNOSIS — Z8589 Personal history of malignant neoplasm of other organs and systems: Secondary | ICD-10-CM | POA: Diagnosis not present

## 2018-12-17 DIAGNOSIS — C07 Malignant neoplasm of parotid gland: Secondary | ICD-10-CM | POA: Diagnosis not present

## 2018-12-17 DIAGNOSIS — Z79899 Other long term (current) drug therapy: Secondary | ICD-10-CM | POA: Diagnosis not present

## 2018-12-17 DIAGNOSIS — C50311 Malignant neoplasm of lower-inner quadrant of right female breast: Secondary | ICD-10-CM

## 2018-12-17 DIAGNOSIS — Z923 Personal history of irradiation: Secondary | ICD-10-CM | POA: Insufficient documentation

## 2018-12-17 DIAGNOSIS — R42 Dizziness and giddiness: Secondary | ICD-10-CM | POA: Insufficient documentation

## 2018-12-17 LAB — CMP (CANCER CENTER ONLY)
ALT: 27 U/L (ref 0–44)
AST: 25 U/L (ref 15–41)
Albumin: 4.7 g/dL (ref 3.5–5.0)
Alkaline Phosphatase: 69 U/L (ref 38–126)
Anion gap: 9 (ref 5–15)
BUN: 17 mg/dL (ref 8–23)
CO2: 26 mmol/L (ref 22–32)
Calcium: 10.3 mg/dL (ref 8.9–10.3)
Chloride: 106 mmol/L (ref 98–111)
Creatinine: 0.83 mg/dL (ref 0.44–1.00)
GFR, Est AFR Am: >60 mL/min (ref >60)
GFR, Estimated: >60 mL/min (ref >60)
Glucose, Bld: 134 mg/dL — ABNORMAL HIGH (ref 70–99)
Potassium: 3.8 mmol/L (ref 3.5–5.1)
Sodium: 141 mmol/L (ref 135–145)
Total Bilirubin: 0.7 mg/dL (ref 0.3–1.2)
Total Protein: 7.8 g/dL (ref 6.5–8.1)

## 2018-12-17 LAB — CBC WITH DIFFERENTIAL (CANCER CENTER ONLY)
Abs Immature Granulocytes: 0.03 10*3/uL (ref 0.00–0.07)
Basophils Absolute: 0 10*3/uL (ref 0.0–0.1)
Basophils Relative: 0 %
Eosinophils Absolute: 0 10*3/uL (ref 0.0–0.5)
Eosinophils Relative: 0 %
HCT: 44.2 % (ref 36.0–46.0)
Hemoglobin: 14.8 g/dL (ref 12.0–15.0)
Immature Granulocytes: 0 %
Lymphocytes Relative: 6 %
Lymphs Abs: 0.5 10*3/uL — ABNORMAL LOW (ref 0.7–4.0)
MCH: 32.4 pg (ref 26.0–34.0)
MCHC: 33.5 g/dL (ref 30.0–36.0)
MCV: 96.7 fL (ref 80.0–100.0)
Monocytes Absolute: 0.1 10*3/uL (ref 0.1–1.0)
Monocytes Relative: 1 %
Neutro Abs: 8.1 10*3/uL — ABNORMAL HIGH (ref 1.7–7.7)
Neutrophils Relative %: 93 %
Platelet Count: 199 10*3/uL (ref 150–400)
RBC: 4.57 MIL/uL (ref 3.87–5.11)
RDW: 12.6 % (ref 11.5–15.5)
WBC Count: 8.7 10*3/uL (ref 4.0–10.5)
nRBC: 0 % (ref 0.0–0.2)

## 2018-12-17 MED ORDER — SODIUM CHLORIDE (PF) 0.9 % IJ SOLN
INTRAMUSCULAR | Status: AC
  Filled 2018-12-17: qty 50

## 2018-12-17 MED ORDER — IOHEXOL 300 MG/ML  SOLN
75.0000 mL | Freq: Once | INTRAMUSCULAR | Status: AC | PRN
  Administered 2018-12-17: 75 mL via INTRAVENOUS

## 2018-12-18 ENCOUNTER — Encounter: Payer: Self-pay | Admitting: Radiation Oncology

## 2018-12-18 ENCOUNTER — Ambulatory Visit
Admission: RE | Admit: 2018-12-18 | Discharge: 2018-12-18 | Disposition: A | Discharge status: Home / Self Care | Payer: Medicare Other | Source: Ambulatory Visit | Attending: Radiation Oncology | Admitting: Radiation Oncology

## 2018-12-18 ENCOUNTER — Other Ambulatory Visit: Payer: Self-pay

## 2018-12-18 VITALS — BP 154/83 | HR 92 | Temp 98.5°F | Resp 18 | Ht 65.0 in | Wt 182.0 lb

## 2018-12-18 DIAGNOSIS — Z1329 Encounter for screening for other suspected endocrine disorder: Secondary | ICD-10-CM

## 2018-12-18 DIAGNOSIS — C07 Malignant neoplasm of parotid gland: Secondary | ICD-10-CM

## 2018-12-18 DIAGNOSIS — Z8589 Personal history of malignant neoplasm of other organs and systems: Secondary | ICD-10-CM | POA: Diagnosis not present

## 2018-12-18 HISTORY — DX: Dizziness and giddiness: R42

## 2018-12-18 MED ORDER — PREDNISONE 50 MG PO TABS: ORAL_TABLET | ORAL | 0 refills | Status: DC

## 2018-12-18 NOTE — Progress Notes (Signed)
Radiation Oncology         [phone]) [phone] ________________________________  Name: [name] MRN: [id number]  Date: 12/18/2018  DOB: November 09, 1945  Follow-Up Visit Note  CC: [name], MD  Francina Ames, MD  Diagnosis and Prior Radiotherapy:       ICD-10-CM   1. Screening for hypothyroidism  Z13.29 TSH  2. Cancer of parotid gland (HCC)  C07 predniSONE (DELTASONE) 50 MG tablet    CT Soft Tissue Neck W Contrast    CT Chest W Contrast    BUN & Creatinine ([place])    TSH     08/05/2018 - 09/22/2018: Left Parotid / 66 Gy in 33 fractions  09/01/2017 - 09/29/2017: Right Breast +seroma boost / total of 50.56 Gy (Dr. Lisbeth Renshaw)  CHIEF COMPLAINT:  Here for follow-up and surveillance of parotid gland cancer  Narrative:  The patient returns today for routine follow-up and to review recent scans.   She underwent chest and neck CT scans yesterday, 12/17/2018. Chest CT showed no definite signs of metastatic disease in the chest. Presumed bone island, 4th anterior right rib. Neck CT showed no recurrent mass or adenopathy in the neck.  She feels well overall. She does report that she had 5 episodes of vertigo last month.  The last one took place about 3 weeks ago.  After the last episode she felt a small chunk of dried blackish tissue come out of her ear (left)   She denies any rib pain  ALLERGIES:  is allergic to dermacinrx surgical pharmapak and iohexol.  Meds: Current Outpatient Medications  Medication Sig Dispense Refill   anastrozole (ARIMIDEX) 1 MG tablet TAKE 1 TABLET(1 MG) BY MOUTH DAILY 90 tablet 3   atorvastatin (LIPITOR) 80 MG tablet Take 80 mg by mouth every evening.     Calcium-Phosphorus-Vitamin D (CITRACAL +D3 PO) Take 1 tablet by mouth daily.     Cholecalciferol (VITAMIN D3) 2000 units TABS Take 2,000 Units by mouth daily.     diphenhydrAMINE (BENADRYL) 50 MG tablet Take 1 tablet at 1 hour before CT scans. 30 tablet 0   FINACEA 15 % cream Apply 1 application topically  daily. Applied to nose.  0   LUTEIN-ZEAXANTHIN PO Take 1 tablet by mouth daily.     MICARDIS HCT 40-12.5 MG per tablet Take 1 tablet by mouth daily.      predniSONE (DELTASONE) 50 MG tablet Take 1 tablet at 13 hours, 7 hours, and 1 hour before CT scans. 3 tablet 0   ZETIA 10 MG tablet Take 10 mg by mouth every evening.      No current facility-administered medications for this encounter.     Physical Findings: The patient is in no acute distress. Patient is alert and oriented. Wt Readings from Last 3 Encounters:  12/18/18 182 lb (82.6 kg)  11/10/18 177 lb 6 oz (80.5 kg)  08/26/18 182 lb 9.6 oz (82.8 kg)    height is 5\' 5"  (1.651 m) and weight is 182 lb (82.6 kg). Her temporal temperature is 98.5 F (36.9 C). Her blood pressure is 154/83 (abnormal) and her pulse is 92. Her respiration is 18 and oxygen saturation is 99%. .  General: Alert and oriented, in no acute distress  HEENT:  debris in left ear canal still present (light brown, dry) Psychiatric: Judgment and insight are intact. Affect is appropriate. Skin: alopecia of scalp behind left ear, skin healing well. Neck: No palpable masses  Lab Findings: Lab Results  Component Value Date  WBC 8.7 12/17/2018   HGB 14.8 12/17/2018   HCT 44.2 12/17/2018   MCV 96.7 12/17/2018   PLT 199 12/17/2018    No results found for: TSH  Radiographic Findings: Ct Soft Tissue Neck W Contrast  Result Date: 12/17/2018 CLINICAL DATA:  Parotid cancer post resection.  Staging. EXAM: CT NECK WITH CONTRAST TECHNIQUE: Multidetector CT imaging of the neck was performed using the standard protocol following the bolus administration of intravenous contrast. CONTRAST:  66mL OMNIPAQUE IOHEXOL 300 MG/ML  SOLN COMPARISON:  CT neck 05/25/2018 FINDINGS: Pharynx and larynx: Normal. No mass or swelling. Salivary glands: Left parotidectomy. Soft tissue thickening and scarring is present at the surgical site. No residual mass identified. Previously noted  necrotic mass has been resected. Right parotid normal.  Submandibular normal bilaterally. Thyroid: Negative Lymph nodes: No enlarged or pathologic lymph nodes in the neck. Vascular: Normal vascular enhancement. Limited intracranial: Negative Visualized orbits: Negative Mastoids and visualized paranasal sinuses: Negative Skeleton: Degenerative changes in the cervical spine. No acute skeletal abnormality. Upper chest: Lung apices clear bilaterally. Other: None IMPRESSION: Postop left parotidectomy. Large tumor has been resected since the prior CT. No recurrent mass or adenopathy in the neck. Continued CT surveillance suggested. Electronically Signed   By: Franchot Gallo M.D.   On: 12/17/2018 20:14   Ct Chest W Contrast  Result Date: 12/17/2018 CLINICAL DATA:  History of parotid gland and neoplasm and breast cancer. EXAM: CT CHEST WITH CONTRAST TECHNIQUE: Multidetector CT imaging of the chest was performed during intravenous contrast administration. CONTRAST:  2mL OMNIPAQUE IOHEXOL 300 MG/ML  SOLN COMPARISON:  None FINDINGS: Cardiovascular: Scattered atherosclerosis. No signs of aneurysm. Standard 3 vessel arch. Heart size normal. Central pulmonary vasculature is unremarkable. Mediastinum/Nodes: No internal mammary nodal enlargement. No thoracic inlet or axillary nodal enlargement. No mediastinal or hilar lymphadenopathy. Lungs/Pleura: No signs of nodule or mass. Mild biapical scarring. Minimal basilar atelectasis. Airways are patent. No consolidation or evidence of pleural effusion. Upper Abdomen: Incidental imaging of upper abdominal contents is unremarkable. Musculoskeletal: Signs of lumpectomy in the medial inferior right breast. Single densely sclerotic focus in the anterior right fourth rib no findings to suggest rib fracture in this location in the past. However, previous imaging was performed chest x-ray. IMPRESSION: No definite signs of metastatic disease in the chest. Densely sclerotic lesion in the  right anterior fourth rib is likely a bone island. No priors are available for comparison that would allow for definitive correlation. If there are previous chest CTs comparison with these studies may be helpful. Alternatively bone scan follow-up may be useful. Aortic Atherosclerosis (ICD10-I70.0). Electronically Signed   By: Zetta Bills M.D.   On: 12/17/2018 14:13    Impression/Plan:    1) Head and Neck Cancer Status: Healing well from radiotherapy, NED  2) Nutritional Status: No issues PEG tube: no  3) vertigo.  This is resolved since her episode 3 weeks ago.  She does continue to have debris in her left ear canal.  This is likely related to the acute effects of radiotherapy.  I recommended that she have her primary doctor or Dr. Nicolette Bang and clean out her ear canal now.  She will have her primary care doctor consider this in November  4) contrast allergy-she broke into hives when she received contrast earlier this year.  I will prescribe prednisone and Benadryl for a scan in 6 mo (f/u imaging recommended by radiology)  5) Follow-up in 46mo after imaging (CT chest, neck). The patient was encouraged to call  with any issues or questions before then. ENT f/u in 55mo - we will arrange.    _____________________________________   Eppie Gibson, MD  This document serves as a record of services personally performed by Eppie Gibson, MD. It was created on her behalf by Wilburn Mylar, a trained medical scribe. The creation of this record is based on the scribe's personal observations and the provider's statements to them. This document has been checked and approved by the attending provider.

## 2018-12-22 ENCOUNTER — Telehealth: Payer: Self-pay | Admitting: *Deleted

## 2018-12-22 NOTE — Telephone Encounter (Addendum)
Oncology Nurse Navigator Documentation  Per patient's 12/18/2018 post-treatment follow-up with Dr. Isidore Moos, sent e-mail to Johna Sheriff, scheduler for Dr. Nicolette Bang, requesting routine follow-up appointment in 3 months.  Addendum:  Rec'd 10/7 e-mail from [facility] indicating appt scheduled for 03/30/2019.  Gayleen Orem, RN, BSN Head & Neck Oncology Nurse [facility] at [place] [phone]

## 2019-02-23 ENCOUNTER — Encounter: Payer: Self-pay | Admitting: *Deleted

## 2019-02-24 ENCOUNTER — Telehealth: Payer: Self-pay | Admitting: Hematology

## 2019-02-24 NOTE — Progress Notes (Signed)
[facility]   Telephone:(336) [phone] Fax:(336) [phone]   Clinic Follow up Note   Patient Care Team: [name], MD as PCP - General [name], MD as Consulting Physician (General Surgery) Truitt Merle, MD as Consulting Physician (Hematology) Kyung Rudd, MD as Consulting Physician (Radiation Oncology) Delice Bison Charlestine Massed, NP as Nurse Practitioner (Hematology and Oncology) Eppie Gibson, MD as Attending Physician (Radiation Oncology) Jerrell Belfast, MD as Consulting Physician (Otolaryngology) Francina Ames, MD as Referring Physician (Otolaryngology) Leota Sauers, RN as Oncology Nurse Navigator   I connected with [name] on 02/25/2019 at 11:20 AM EST by telephone visit and verified that I am speaking with the correct person using two identifiers.  I discussed the limitations, risks, security and privacy concerns of performing an evaluation and management service by telephone and the availability of in person appointments. I also discussed with the patient that there may be a patient responsible charge related to this service. The patient expressed understanding and agreed to proceed.   Patient's location:  Her home  Provider's location:  My office   CHIEF COMPLAINT: F/u of right breast cancer  SUMMARY OF ONCOLOGIC HISTORY: Oncology History Overview Note  Cancer Staging Malignant neoplasm of lower-inner quadrant of right breast of female, estrogen receptor positive ([place]) Staging form: Breast, AJCC 8th Edition - Clinical stage from 07/16/2017: Stage IA (cT1b, cN0, cM0, G2, ER+, PR+, HER2-) - Signed by Truitt Merle, MD on 07/23/2017     Malignant neoplasm of lower-inner quadrant of right breast of female, estrogen receptor positive ([place])  07/15/2017 Mammogram   IMPRESSION: The new irregular lesion in the right breast is indeterminate. An ultrasound is recommended.    07/15/2017 Imaging   [place] R Breast and Axilla 07/15/17 IMPRESSION:  The 6 mm mass  in the right breast is suspicious for malignancy. A biopsy is recommended.    07/16/2017 Cancer Staging   Staging form: Breast, AJCC 8th Edition - Clinical stage from 07/16/2017: Stage IA (cT1b, cN0, cM0, G2, ER+, PR+, HER2-) - Signed by Truitt Merle, MD on 07/23/2017   07/16/2017 Initial Biopsy   Diagnosis 07/16/17 Breast, right, needle core biopsy, 3 o'clock, 7cm - INVASIVE DUCTAL CARCINOMA, SEE COMMENT. - DUCTAL CARCINOMA IN SITU WITH NECROSIS.   07/16/2017 Receptors her2   Estrogen Receptor: 100%, POSITIVE, STRONG STAINING INTENSITY Progesterone Receptor: 80%, POSITIVE, STRONG STAINING INTENSITY Proliferation Marker Ki67: 10% HER2 - NEGATIVE   07/22/2017 Initial Diagnosis   Malignant neoplasm of lower-inner quadrant of right breast of female, estrogen receptor positive ([place])   07/30/2017 Surgery   BREAST LUMPECTOMY WITH RADIOACTIVE SEED AND SENTINEL LYMPH NODE BIOPSY by Dr. Barry Dienes  07/30/17    07/30/2017 Pathology Results   Diagnosis 07/30/17  1. Breast, lumpectomy, right - INVASIVE DUCTAL CARCINOMA, GRADE 2, SPANNING 0.6 CM. - INTERMEDIATE GRADE DUCTAL CARCINOMA IN SITU. - FINAL RESECTION MARGINS ARE NEGATIVE. - BIOPSY SITE. - SEE ONCOLOGY TABLE. 2. Breast, excision, right additional lateral margin - FIBROCYSTIC CHANGE. - NO MALIGNANCY IDENTIFIED. 3. Lymph node, sentinel, biopsy, right axillary #1 - ONE OF ONE LYMPH NODES NEGATIVE FOR CARCINOMA (0/1). 4. Lymph node, sentinel, biopsy, right axillary #2 - ONE OF ONE LYMPH NODES NEGATIVE FOR CARCINOMA (0/1). 5. Lymph node, sentinel, biopsy, right axillary #3 - ONE OF ONE LYMPH NODES NEGATIVE FOR CARCINOMA (0/1). 6. Lymph node, sentinel, biopsy, right axillary #4 - ONE OF ONE LYMPH NODES NEGATIVE FOR CARCINOMA (0/1).   07/30/2017 Oncotype testing   Oncotype 07/30/17 Recurrence score 20 with a 6%  risk of distant recurrence on Tamoxifen alone and a <1% benefit of chemotherapy    09/01/2017 - 09/29/2017 Radiation Therapy   Radiation with Dr.  Lisbeth Renshaw  09/01/17 and  completed on 09/29/17.    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 See comment.   06/29/2018 Surgery   Left total parotidectomy    06/29/2018 Pathology Results   PROCEDURE: Parotidectomy (completion), TUMOR SITE: Parotid gland, deep lobe TUMOR LATERALITY: Left TUMOR FOCALITY: Unifocal TUMOR SIZE:    GREATEST DIMENSION: 4.0 cm    ADDITIONAL DIMENSIONS: 3.1 x 2.6 cm HISTOLOGIC TYPE: Salivary duct carcinoma TUMOR EXTENSION: Tumor is confined to the parotid gland MARGINS: Focally involved by carcinoma LYMPHOVASCULAR INVASION: present PERINERUAL INVASION: Not identified REGIONAL LYMPH NODES: No regional lymph nodes submitted or found PATHOLOGIC STAGE CLASSIFICATION (pTNM, AJCC 8th Ed): pT2 pNX DISTANT METASTASIS (pM): N/A ADDITIONAL PATHOLOGIC FINDINGS: None   06/29/2018 Cancer Staging   Staging form: Major Salivary Glands, AJCC 8th Edition - Pathologic stage from 06/29/2018: Stage Unknown (pT2, pNX, cM0) - Signed by Tish Men, MD on 07/13/2018   07/13/2018 Initial Diagnosis   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 Isidore Moos 08/05/18-09/22/18      CURRENT THERAPY:  Anastrozole 90m dailystarting 10/2017  INTERVAL HISTORY:  [name] here for a follow up of right breast cancer. She was last seen by me 6 months ago. She notes he is doing well. She denies nay pain. She is tolerating Anastrozole. She notes she has been itching lately, but is not sure if this is related to the weather or medication. She denies having a rash. She has been using lotion to help with itching. She notes her RT for her Parotid gland went well and her recovered. She notes she is back to walking again and  doing really well.  She will f/u with Dr. SIsidore Moosin 2021 and will be having CT scan.    REVIEW OF SYSTEMS:   Constitutional: Denies fevers, chills or abnormal weight loss Eyes: Denies blurriness of vision Ears, nose, mouth, throat, and face: Denies mucositis or sore throat Respiratory: Denies cough, dyspnea or wheezes Cardiovascular: Denies palpitation, chest discomfort or lower extremity swelling Gastrointestinal:  Denies nausea, heartburn or change in bowel habits Skin: Denies abnormal skin rashes (+) Skin itching  Lymphatics: Denies new lymphadenopathy or easy bruising Neurological:Denies numbness, tingling or new weaknesses Behavioral/Psych: Mood is stable, no new changes  All other systems were reviewed with the patient and are negative.  MEDICAL HISTORY:  Past Medical History:  Diagnosis Date  . Arthritis   . [place]   right breast cancer  . GERD (gastroesophageal reflux disease)    mild- TUMS used sparingly  . History of radiation therapy 08/05/18- 09/22/18   Left Parotid 33 fractions for a total of 66 Gy.   .Marland KitchenHistory of radiation therapy 09/01/2017- 09/29/2017   Right Breast + seroma boost/ total of 50.06 Gy   . Hyperlipidemia   . Hypertension   . PONV (postoperative nausea and vomiting)    also "passed out with cyst removal from neck"superficial"  . Syncope    x2 episodes-evaluated and findings normal- "usually related to seeing blood or thought of it"  . Vaso vagal episode  easily  . Vertigo    She reports several episodes of vertigo. She tells me something black came out of her ear, and she has not had an episode since then.     SURGICAL HISTORY: Past Surgical History:  Procedure Laterality Date  . APPENDECTOMY    . BREAST LUMPECTOMY WITH RADIOACTIVE SEED AND SENTINEL LYMPH NODE BIOPSY Right 07/30/2017   Procedure: BREAST LUMPECTOMY WITH RADIOACTIVE SEED AND SENTINEL LYMPH NODE BIOPSY;  Surgeon: [name], MD;  Location: [facility];   Service: General;  Laterality: Right;  . BREAST SURGERY  08/27/11   right lumpectomy w/needle localization  . BUNIONECTOMY Bilateral 2000   bilateral -hardware remains  . CYST REMOVAL NECK     sebaceous  cyst from neck  . DILATION AND CURETTAGE OF UTERUS    . INTRAUTERINE DEVICE INSERTION    . IUD REMOVAL    . LAPAROSCOPIC APPENDECTOMY N/A 03/27/2017   Procedure: APPENDECTOMY LAPAROSCOPIC;  Surgeon: Alphonsa Overall, MD;  Location: [facility];  Service: General;  Laterality: N/A;  . PAROTIDECTOMY Left 11/28/2017   SUPERFICIAL  . PAROTIDECTOMY Left 11/28/2017   Procedure: LEFT SUPEFICIAL PAROTIDECTOMY WITH OBSERVATION;  Surgeon: Jerrell Belfast, MD;  Location: [facility];  Service: ENT;  Laterality: Left;  . PAROTIDECTOMY Left 06/29/2018   [facility]. Deep parotidectomy  . Removal Right Straphenous vein  2011  . TONSILLECTOMY     child  . TOTAL HIP ARTHROPLASTY Right 11/29/2015   Procedure: RIGHT TOTAL HIP ARTHROPLASTY ANTERIOR APPROACH;  Surgeon: Gaynelle Arabian, MD;  Location: [facility];  Service: Orthopedics;  Laterality: Right;    I have reviewed the social history and family history with the patient and they are unchanged from previous note.  ALLERGIES:  is allergic to dermacinrx surgical pharmapak and iohexol.  MEDICATIONS:  Current Outpatient Medications  Medication Sig Dispense Refill  . anastrozole (ARIMIDEX) 1 MG tablet TAKE 1 TABLET(1 MG) BY MOUTH DAILY 90 tablet 3  . atorvastatin (LIPITOR) 80 MG tablet Take 80 mg by mouth every evening.    . Calcium-Phosphorus-Vitamin D (CITRACAL +D3 PO) Take 1 tablet by mouth daily.    . Cholecalciferol (VITAMIN D3) 2000 units TABS Take 2,000 Units by mouth daily.    . diphenhydrAMINE (BENADRYL) 50 MG tablet Take 1 tablet at 1 hour before CT scans. 30 tablet 0  . FINACEA 15 % cream Apply 1 application topically daily. Applied to nose.  0  . LUTEIN-ZEAXANTHIN PO Take 1 tablet by mouth daily.    Marland Kitchen MICARDIS HCT 40-12.5 MG  per tablet Take 1 tablet by mouth daily.     . predniSONE (DELTASONE) 50 MG tablet Take 1 tablet at 13 hours, 7 hours, and 1 hour before CT scans. 3 tablet 0  . ZETIA 10 MG tablet Take 10 mg by mouth every evening.      No current facility-administered medications for this visit.    PHYSICAL EXAMINATION: ECOG PERFORMANCE STATUS: 0 - Asymptomatic  No vitals taken today, Exam not performed today   LABORATORY DATA:  I have reviewed the data as listed CBC Latest Ref Rng & Units 12/17/2018 08/26/2018 04/27/2018  WBC 4.0 - 10.5 K/uL 8.7 5.2 6.8  Hemoglobin 12.0 - 15.0 g/dL 14.8 13.5 14.3  Hematocrit 36.0 - 46.0 % 44.2 41.0 43.1  Platelets 150 - 400 K/uL 199 189 210     CMP Latest Ref Rng & Units 12/17/2018 08/26/2018 04/27/2018  Glucose 70 - 99 mg/dL 134(H) 96 88  BUN 8 - 23 mg/dL '17 12 13  ' Creatinine 0.44 - 1.00 mg/dL 0.83 0.83 0.85  Sodium 135 - 145 mmol/L 141 139 141  Potassium 3.5 - 5.1 mmol/L 3.8 4.2 3.9  Chloride 98 - 111 mmol/L 106 103 104  CO2 22 - 32 mmol/L '26 27 29  ' Calcium 8.9 - 10.3 mg/dL 10.3 9.8 9.8  Total Protein 6.5 - 8.1 g/dL 7.8 7.2 7.5  Total Bilirubin 0.3 - 1.2 mg/dL 0.7 0.9 1.0  Alkaline Phos 38 - 126 U/L 69 66 68  AST 15 - 41 U/L '25 21 23  ' ALT 0 - 44 U/L '27 21 25      ' RADIOGRAPHIC STUDIES: I have personally reviewed the radiological images as listed and agreed with the findings in the report. No results found.   ASSESSMENT & [name]:  [name] is a 73 y.o. female with   1. 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 She is s/p right breast lumpectomy and adjuvant radiation. Her RS was 20, adjuvant chemotherapy was not recommended.  -She started antiestrogen therapy with anastrozole in 10/2017. She is tolerating well with mild joint pain in hands, will continue for 5 to 7 years. -She is clinically doing well. She  does have recent skin itching, but no rash. She overall has no concerns.  -Continue surveillance. Next mammogram in 06/2019 -Continue Anastrozole  -I encouraged her to continue COVID19 precautions.  -f/u in 6 months   2. Osteopenia -She had fracture in early 2019 with little physical cause which is a concern forosteoporosis -DEXA from3/2019 withlowest T-score of -1.3 at left femur neck. -She is on anastrozole which we discussed can weaken her bone.  -If her bone density worsens at next DEXA we will discuss starting bisphosphonate.  -She will continue Calcium and Vitamin D   3. Left Parotid gland carcinoma  -Initial biopsy benign in 11/2017. Repeated in 05/2009 was positive for carcinoma. -She underwent parotidectomy with Dr. Wilburn Cornelia on 06/29/18, path showed salivary duct carcinoma, margin was positive.  -She underwent Radiation with Dr. Isidore Moos 08/05/18-09/22/18. She is on surveillance.   4. HTN -Continue medication and follow-up with PCP  5. Skin itching  -She has no rash.  -She attributed to either the weather or medication. This is not a known anastrozole side effects and allergy reaction would come with rash.  -I encouraged her to continue using lotion and I suggest OTC allergy medication. Will monitor.    [name]: Lab and f/u 6 months Mammogram in 06/2018 at [facility] anastrozole   No problem-specific Assessment & [name] notes found for this encounter.   Orders Placed This Encounter  Procedures  . MM DIAG BREAST TOMO BILATERAL    Standing Status:   Future    Standing Expiration Date:   02/25/2020    Scheduling Instructions:     [facility]    Order Specific Question:   Reason for Exam (SYMPTOM  OR DIAGNOSIS REQUIRED)    Answer:   screening    Order Specific Question:   Preferred imaging location?    Answer:   External   I discussed the assessment and treatment [name] with the patient. The patient was provided an opportunity to ask questions and all were  answered. The patient agreed with the [name] and demonstrated an understanding of the instructions.  The patient was advised to call back or seek an in-person  evaluation if the symptoms worsen or if the condition fails to improve as anticipated.  I provided 15 minutes of non face-to-face telephone visit time during this encounter, and > 50% was spent counseling as documented under my assessment & [name].    Truitt Merle, MD 02/25/2019   I, Joslyn Devon, am acting as scribe for Truitt Merle, MD.   I have reviewed the above documentation for accuracy and completeness, and I agree with the above.

## 2019-02-25 ENCOUNTER — Encounter: Payer: Self-pay | Admitting: Hematology

## 2019-02-25 ENCOUNTER — Inpatient Hospital Stay: Payer: Medicare Other | Attending: Hematology | Admitting: Hematology

## 2019-02-25 ENCOUNTER — Inpatient Hospital Stay: Payer: Medicare Other

## 2019-02-25 DIAGNOSIS — Z170 Estrogen receptor positive status [ER+]: Secondary | ICD-10-CM

## 2019-02-25 DIAGNOSIS — C50311 Malignant neoplasm of lower-inner quadrant of right female breast: Secondary | ICD-10-CM

## 2019-02-26 ENCOUNTER — Telehealth: Payer: Self-pay | Admitting: Hematology

## 2019-02-26 NOTE — Telephone Encounter (Signed)
Scheduled appt per 12/10 los.  Patient needed her appt scheduled in may.  Scheduled appt in may per pt request and she is aware of the appt date and time.

## 2019-04-17 ENCOUNTER — Ambulatory Visit: Payer: Medicare Other

## 2019-04-22 ENCOUNTER — Ambulatory Visit: Payer: Medicare Other

## 2019-04-23 ENCOUNTER — Ambulatory Visit: Payer: Medicare PPO | Attending: Internal Medicine

## 2019-04-23 DIAGNOSIS — Z23 Encounter for immunization: Secondary | ICD-10-CM | POA: Insufficient documentation

## 2019-04-23 NOTE — Progress Notes (Signed)
   Covid-19 Vaccination Clinic  Name:  [name]    MRN: [id number] DOB: [birth year]  04/23/2019  [name] was observed post Covid-19 immunization for 15 minutes without incidence. She was provided with Vaccine Information Sheet and instruction to access the V-Safe system.   [name] was instructed to call 911 with any severe reactions post vaccine: Marland Kitchen Difficulty breathing  . Swelling of your face and throat  . A fast heartbeat  . A bad rash all over your body  . Dizziness and weakness    Immunizations Administered    Name Date Dose VIS Date Route   Pfizer COVID-19 Vaccine 04/23/2019 11:10 AM 0.3 mL 02/26/2019 Intramuscular   Manufacturer: [place]   Lot: [id number]   [place]: [id number]

## 2019-05-11 ENCOUNTER — Ambulatory Visit: Payer: Medicare Other

## 2019-05-18 ENCOUNTER — Ambulatory Visit: Payer: Medicare PPO | Attending: Internal Medicine

## 2019-05-18 DIAGNOSIS — Z23 Encounter for immunization: Secondary | ICD-10-CM | POA: Insufficient documentation

## 2019-05-18 NOTE — Progress Notes (Signed)
   Covid-19 Vaccination Clinic  Name:  [name]    MRN: [id number] DOB: Apr 23, 1945  05/18/2019  [name] was observed post Covid-19 immunization for 15 minutes without incident. She was provided with Vaccine Information Sheet and instruction to access the V-Safe system.   [name] was instructed to call 911 with any severe reactions post vaccine: Marland Kitchen Difficulty breathing  . Swelling of face and throat  . A fast heartbeat  . A bad rash all over body  . Dizziness and weakness   Immunizations Administered    Name Date Dose VIS Date Route   Pfizer COVID-19 Vaccine 05/18/2019 11:32 AM 0.3 mL 02/26/2019 Intramuscular   Manufacturer: [place]   Lot: [id number]   [place]: [id number]

## 2019-06-04 ENCOUNTER — Other Ambulatory Visit: Payer: Self-pay | Admitting: Radiation Oncology

## 2019-06-04 DIAGNOSIS — C07 Malignant neoplasm of parotid gland: Secondary | ICD-10-CM

## 2019-06-04 MED ORDER — PREDNISONE 50 MG PO TABS: ORAL_TABLET | ORAL | 3 refills | Status: DC

## 2019-06-04 MED ORDER — DIPHENHYDRAMINE HCL 50 MG PO TABS: ORAL_TABLET | ORAL | 3 refills | Status: DC

## 2019-06-18 ENCOUNTER — Telehealth: Payer: Self-pay | Admitting: *Deleted

## 2019-06-18 NOTE — Telephone Encounter (Signed)
CALLED PATIENT TO INFORM OF STAT LABS ON 07-05-19 @ 8 AM, LVM FOR A RETURN CALL

## 2019-06-21 ENCOUNTER — Telehealth: Payer: Self-pay | Admitting: *Deleted

## 2019-06-21 ENCOUNTER — Telehealth: Payer: Self-pay

## 2019-06-21 NOTE — Telephone Encounter (Signed)
xxxx 

## 2019-06-21 NOTE — Telephone Encounter (Signed)
Returned patient's phone call, spoke with patient 

## 2019-06-21 NOTE — Telephone Encounter (Signed)
Patient left a voicemail stating she has been having GI issues for the past three nights (stomach cramps, indigestion, discomfort under her ribs) and wanted to know if Dr. Isidore Moos had any advice or recommendations for her.   Returned patients call to inform her that Dr. Isidore Moos was on vacation this week, and that based on her symptoms I would recommend that she follow up with her PCP for a possible referral to a GI specialist. Patient stated she hasn't seen her PCP for some time and feels "closer to Dr. Isidore Moos and her team which is why I wanted her input". Acknowledged patient's feelings but reinforced that Dr. Isidore Moos is patient's radiation oncologist, and area of the body that Dr. Isidore Moos treated (parotid gland) is not likely to be involved in patient's current symptoms. Therefore it is more appropriate for patient to update her PCP with current symptoms so they can make necessary referral/recommendations. Patient verbalized understanding and agreement. Denied any other needs at this time, but knows to call our clinic should something arise in the future.

## 2019-06-22 ENCOUNTER — Ambulatory Visit: Payer: Self-pay | Admitting: Radiation Oncology

## 2019-06-29 ENCOUNTER — Ambulatory Visit: Payer: Medicare Other | Admitting: Radiation Oncology

## 2019-07-05 ENCOUNTER — Other Ambulatory Visit: Payer: Self-pay

## 2019-07-05 ENCOUNTER — Ambulatory Visit
Admission: RE | Admit: 2019-07-05 | Discharge: 2019-07-05 | Disposition: A | Discharge status: Home / Self Care | Payer: Medicare PPO | Source: Ambulatory Visit | Attending: Hematology | Admitting: Hematology

## 2019-07-05 ENCOUNTER — Ambulatory Visit (HOSPITAL_COMMUNITY)
Admission: RE | Admit: 2019-07-05 | Discharge: 2019-07-05 | Disposition: A | Discharge status: Home / Self Care | Payer: Medicare PPO | Source: Ambulatory Visit | Attending: Radiation Oncology | Admitting: Radiation Oncology

## 2019-07-05 DIAGNOSIS — Z8589 Personal history of malignant neoplasm of other organs and systems: Secondary | ICD-10-CM | POA: Diagnosis not present

## 2019-07-05 DIAGNOSIS — C07 Malignant neoplasm of parotid gland: Secondary | ICD-10-CM | POA: Diagnosis not present

## 2019-07-05 DIAGNOSIS — Z1329 Encounter for screening for other suspected endocrine disorder: Secondary | ICD-10-CM

## 2019-07-05 LAB — BUN & CREATININE (CHCC)
BUN: 19 mg/dL (ref 8–23)
Creatinine: 1.03 mg/dL — ABNORMAL HIGH (ref 0.44–1.00)
GFR, Est AFR Am: >60 mL/min (ref >60)
GFR, Estimated: 53 mL/min — ABNORMAL LOW (ref >60)

## 2019-07-05 LAB — TSH: TSH: 1.275 u[IU]/mL (ref 0.308–3.960)

## 2019-07-05 MED ORDER — SODIUM CHLORIDE (PF) 0.9 % IJ SOLN
INTRAMUSCULAR | Status: AC
  Filled 2019-07-05: qty 50

## 2019-07-05 MED ORDER — IOHEXOL 300 MG/ML  SOLN
100.0000 mL | Freq: Once | INTRAMUSCULAR | Status: AC | PRN
  Administered 2019-07-05: 09:00:00 100 mL via INTRAVENOUS

## 2019-07-05 NOTE — Progress Notes (Signed)
Ms. Pille presents for follow up of radiation completed 09/22/18 to her left parotid area.   Pain issues, if any: None Using a feeding tube?: N/A Weight changes, if any:  Wt Readings from Last 3 Encounters:  07/06/19 173 lb (78.5 kg)  12/18/18 182 lb (82.6 kg)  11/10/18 177 lb 6 oz (80.5 kg)   Swallowing issues, if any: None Smoking or chewing tobacco? None Using fluoride trays daily? N/A  Last ENT visit was on: 03/30/2019 Dr. Francina Ames: "There is no evidence of disease on exam today. Return in July , certainly sooner if there are any questions or problems."  Other notable issues, if any: She was having some GI issues a few weeks ago, but got evaluated by her PCP and was told she had a gastric virus. She was treated and symptoms have resolved. Receiving CT scan results from 04/19/202. She is anxious about results and worried she's "going to get bad news".  Vitals:   07/06/19 1420  BP: 125/77  Pulse: 91  Resp: 20  Temp: 98.3 F (36.8 C)  SpO2: 99%  Weight: 173 lb (78.5 kg)  Height: 5' 5.5" (1.664 m)

## 2019-07-06 ENCOUNTER — Other Ambulatory Visit: Payer: Self-pay

## 2019-07-06 ENCOUNTER — Ambulatory Visit
Admission: RE | Admit: 2019-07-06 | Discharge: 2019-07-06 | Disposition: A | Discharge status: Home / Self Care | Payer: Medicare PPO | Source: Ambulatory Visit | Attending: Radiation Oncology | Admitting: Radiation Oncology

## 2019-07-06 ENCOUNTER — Encounter: Payer: Self-pay | Admitting: Radiation Oncology

## 2019-07-06 VITALS — BP 125/77 | HR 91 | Temp 98.3°F | Resp 20 | Ht 65.5 in | Wt 173.0 lb

## 2019-07-06 DIAGNOSIS — M50322 Other cervical disc degeneration at C5-C6 level: Secondary | ICD-10-CM | POA: Diagnosis not present

## 2019-07-06 DIAGNOSIS — I700 Atherosclerosis of aorta: Secondary | ICD-10-CM | POA: Diagnosis not present

## 2019-07-06 DIAGNOSIS — Z79899 Other long term (current) drug therapy: Secondary | ICD-10-CM | POA: Diagnosis not present

## 2019-07-06 DIAGNOSIS — M4802 Spinal stenosis, cervical region: Secondary | ICD-10-CM | POA: Insufficient documentation

## 2019-07-06 DIAGNOSIS — K769 Liver disease, unspecified: Secondary | ICD-10-CM | POA: Insufficient documentation

## 2019-07-06 DIAGNOSIS — C07 Malignant neoplasm of parotid gland: Secondary | ICD-10-CM | POA: Diagnosis present

## 2019-07-06 NOTE — Progress Notes (Signed)
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 Lisbeth Renshaw)  CHIEF COMPLAINT:  Here for follow-up and surveillance of parotid gland cancer  Narrative:  The patient returns today for routine follow-up and to review recent scans.  She reports that she saw Dr. Nicolette Bang 3 months ago and her exam was reassuring.  She had diarrheal symptoms that were diagnosed to be consistent with a gastric virus a few weeks ago.  Those symptoms have resolved.  However, she does report that her stool is much lighter in color than usual.  ALLERGIES:  is allergic to dermacinrx surgical pharmapak and iohexol.  Meds: Current Outpatient Medications  Medication Sig Dispense Refill  . anastrozole (ARIMIDEX) 1 MG tablet TAKE 1 TABLET(1 MG) BY MOUTH DAILY 90 tablet 3  . atorvastatin (LIPITOR) 80 MG tablet Take 80 mg by mouth every evening.    . Calcium-Phosphorus-Vitamin D (CITRACAL +D3 PO) Take 1 tablet by mouth daily.    . Cholecalciferol (VITAMIN D3) 2000 units TABS Take 2,000 Units by mouth daily.    . famotidine (PEPCID) 20 MG tablet Take 20 mg by mouth 2 (two) times daily. Morning and bedtime    . FINACEA 15 % cream Apply 1 application topically daily. Applied to nose.  0  . LUTEIN-ZEAXANTHIN PO Take 1 tablet by mouth daily.    Marland Kitchen MICARDIS HCT 40-12.5 MG per tablet Take 1 tablet by mouth daily.     . predniSONE (DELTASONE) 50 MG tablet Take 1 tablet at 13 hours, 7 hours, and 1 hour before CT scans. 3 tablet 3  . ZETIA 10 MG tablet Take 10 mg by mouth every evening.     . diphenhydrAMINE (BENADRYL) 50 MG tablet Take 1  tablet at 1 hour before CT scans. (Patient not taking: Reported on 07/06/2019) 1 tablet 3   No current facility-administered medications for this encounter.    Physical Findings: The patient is in no acute distress. Patient is alert and oriented. Wt Readings from Last 3 Encounters:  07/06/19 173 lb (78.5 kg)  12/18/18 182 lb (82.6 kg)  11/10/18 177 lb 6 oz (80.5 kg)    height is 5' 5.5" (1.664 m) and weight is 173 lb (78.5 kg). Her temperature is 98.3 F (36.8 C). Her blood pressure is 125/77 and her pulse is 91. Her respiration is 20 and oxygen saturation is 99%. .  General: Alert and oriented, in no acute distress HEENT: Oral cavity is clear Neck: No palpable adenopathy in her neck.  She still has a concavity in the left parotid region from her surgery.   Skin: Superficial hyperpigmentation and dryness remains in the left parotid region and left upper neck Psychiatric: Judgment and insight are intact. Affect is appropriate. Abdomen: nontender   Lab Findings: Lab Results  Component Value Date   WBC 8.7 12/17/2018   HGB 14.8 12/17/2018   HCT 44.2 12/17/2018   MCV 96.7 12/17/2018   PLT 199 12/17/2018    Lab Results  Component Value Date   TSH 1.275 07/05/2019  CMP Latest Ref Rng & Units 07/05/2019 12/17/2018 08/26/2018  Glucose 70 - 99 mg/dL - 134(H) 96  BUN 8 - 23 mg/dL 19 17 12   Creatinine 0.44 - 1.00 mg/dL 1.03(H) 0.83 0.83  Sodium 135 - 145 mmol/L - 141 139  Potassium 3.5 - 5.1 mmol/L - 3.8 4.2  Chloride 98 - 111 mmol/L - 106 103  CO2 22 - 32 mmol/L - 26 27  Calcium 8.9 - 10.3 mg/dL - 10.3 9.8  Total Protein 6.5 - 8.1 g/dL - 7.8 7.2  Total Bilirubin 0.3 - 1.2 mg/dL - 0.7 0.9  Alkaline Phos 38 - 126 U/L - 69 66  AST 15 - 41 U/L - 25 21  ALT 0 - 44 U/L - 27 21     Radiographic Findings: CT Soft Tissue Neck W Contrast  Result Date: 07/05/2019 CLINICAL DATA:  Follow-up parotid cancer status post resection and radiation therapy. EXAM: CT NECK WITH CONTRAST  TECHNIQUE: Multidetector CT imaging of the neck was performed using the standard protocol following the bolus administration of intravenous contrast. CONTRAST:  135mL OMNIPAQUE IOHEXOL 300 MG/ML  SOLN COMPARISON:  12/17/2018 FINDINGS: Pharynx and larynx: No evidence of mass or swelling. Salivary glands: Status post left parotidectomy with decreased postsurgical stranding and soft tissue thickening. No recurrent mass in the parotidectomy bed. Unremarkable appearance of the right parotid and right submandibular glands. Postradiation changes in the left submandibular gland. Thyroid: Unremarkable. Lymph nodes: No enlarged or suspicious lymph nodes in the neck. Vascular: Major vascular structures of the neck are patent with small volume atherosclerotic plaque at the carotid bifurcation and partially visualized bilateral intracranial carotid siphon atherosclerosis. Limited intracranial: Unremarkable. Visualized orbits: Unremarkable. Mastoids and visualized paranasal sinuses: Clear. Skeleton: No suspicious osseous lesion. Severe left facet arthrosis at C4-5 with unchanged grade 1 anterolisthesis. Disc degeneration greatest at C5-6 where there is moderate right neural foraminal stenosis due to uncovertebral spurring. Upper chest: Reported separately. Other: None. IMPRESSION: No evidence of recurrent disease in the neck. Electronically Signed   By: Logan Bores M.D.   On: 07/05/2019 13:55   CT Chest W Contrast  Result Date: 07/05/2019 CLINICAL DATA:  Restaging parotid gland neoplasm. EXAM: CT CHEST WITH CONTRAST TECHNIQUE: Multidetector CT imaging of the chest was performed during intravenous contrast administration. CONTRAST:  163mL OMNIPAQUE IOHEXOL 300 MG/ML  SOLN COMPARISON:  12/17/2018 FINDINGS: Cardiovascular: The heart size appears within normal limits. Aortic atherosclerosis. Mediastinum/Nodes: Normal appearance of the thyroid gland. The trachea appears patent and is midline. Normal appearance of the esophagus.  No axillary or supraclavicular adenopathy. No mediastinal or hilar adenopathy. Lungs/Pleura: No suspicious pulmonary nodule or mass. No pleural effusion, airspace consolidation, or atelectasis. Upper Abdomen: Since the previous exam there is been interval development of multiple ill-defined low attenuation liver lesions, worrisome for metastatic disease. The largest lesion is in the inferior right hepatic lobe, segment 6/7 measuring approximately 6.6 cm, image 65/3. Segment 8 liver lesion measures 1.7 cm, image 50/3. Musculoskeletal: No acute bone abnormality. No suspicious bone lesions. IMPRESSION: 1. No acute cardiopulmonary abnormalities. No mass or adenopathy identified within the chest. 2. Interval development of multiple ill-defined low attenuation liver lesions, worrisome for metastatic disease. 3. Aortic atherosclerosis. Aortic Atherosclerosis (ICD10-I70.0). Electronically Signed   By: Kerby Moors M.D.   On: 07/05/2019 12:14    Impression/Plan:    1) Head and Neck Cancer Status:   She has no evidence of local regional recurrence, but her CT scan shows new lesions in the liver.  We will  present her imaging at tumor board tomorrow and determine the best approach for biopsy.  She knows that she will hear from [place] tomorrow with the tumor board recommendations.  These lesions are concerning for metastatic disease.  Of note, her mother does have a history of nonalcoholic cirrhosis.  We cannot rule out benign causes based on her imaging; tissue will be critical to her diagnosis.  2) Nutritional Status: No issues  3) She has follow-up with otolaryngology in 3 months and she will follow-up with me in a minimum of 6 months.  However, she also knows that I take charge in the interim to achieve a diagnosis for the lesions in her liver.  4) Skin: for her dry skin I recommended applying vitamin E twice a day for healing  Harlow Asa RN, Navigator, was present for this visit as well.  On date of  service, in total, I spent 25 minutes on this encounter. Ms. Ponto was seen in person.   _____________________________________   Eppie Gibson, MD

## 2019-07-06 NOTE — Progress Notes (Signed)
Oncology Nurse Navigator Documentation   Met with patient during her follow up appointment with Dr. Isidore Moos today to receive results of CT scans of neck and chest. She was made aware of abnormal results by Dr. Isidore Moos and that her case will be discussed in tomorrow's Tumor Board. She will receive a phone call from either Dr. Isidore Moos or me with her plan of care going forward.  She did get scheduled for an appointment with Dr. Isidore Moos in October. I also confirmed she is scheduled to see Dr. Nicolette Bang on 10/12/19 for follow up visit. I will remind her of this appointment tomorrow.  She was given my direct contact number should she have any further questions or concerns.   Harlow Asa RN, BSN, OCN Head & Neck Oncology Nurse [place] at [facility] Phone # [phone]  Fax # [phone]

## 2019-07-07 ENCOUNTER — Other Ambulatory Visit: Payer: Self-pay | Admitting: Radiation Oncology

## 2019-07-07 DIAGNOSIS — C07 Malignant neoplasm of parotid gland: Secondary | ICD-10-CM

## 2019-07-07 DIAGNOSIS — C50311 Malignant neoplasm of lower-inner quadrant of right female breast: Secondary | ICD-10-CM

## 2019-07-07 DIAGNOSIS — C089 Malignant neoplasm of major salivary gland, unspecified: Secondary | ICD-10-CM

## 2019-07-07 NOTE — Progress Notes (Signed)
Oncology Nurse Navigator Documentation  I left a voice mail message with Ms. Gauna this morning. I informed her that Dr. Isidore Moos has placed an order for an U/S Guided core biopsy of her liver based on recommendations from Radiology during the Head and Neck conference today. I informed her that she should receive a phone call in the next few days to get the biopsy scheduled. I left my direct contact number if she had any further questions or concerns.  Harlow Asa RN, BSN, OCN Head & Neck Oncology Nurse [place] at [facility] Phone # [phone]  Fax # [phone]

## 2019-07-08 ENCOUNTER — Encounter (HOSPITAL_COMMUNITY): Payer: Self-pay

## 2019-07-08 ENCOUNTER — Telehealth (HOSPITAL_COMMUNITY): Payer: Self-pay

## 2019-07-08 NOTE — Progress Notes (Signed)
[name]. [place] Female, 74 y.o., 12-07-1945 MRN:  [id number] Phone:  [phone] [name]) PCP:  Christina Pillar, MD Primary Cvg:  [facility] Medicare/[employer] Medicare Choice Ppo Next Appt With Oncology 08/15/2019 at 8:30 AM  RE: Biopsy Received: Yesterday Message Contents  Christina Cleveland, MD  [name]   [place] core liver lesion    [facility]   Previous Messages  ----- Message -----  From: [name]  Sent: 07/07/2019 10:44 AM EDT  To: [name]  Subject: Biopsy                      Procedure Requested: [place] Core Biopsy (Liver)    Reason for Procedure: New Liver masses history of parotid cancer and early stage breast cancer.    Provider Requesting: [name]  Provider Telephone: [phone]    Other Info: new liver masses on CT Chest,

## 2019-07-13 ENCOUNTER — Ambulatory Visit: Payer: Medicare Other | Admitting: Radiation Oncology

## 2019-07-19 ENCOUNTER — Other Ambulatory Visit: Payer: Self-pay | Admitting: Radiology

## 2019-07-20 ENCOUNTER — Other Ambulatory Visit: Payer: Self-pay

## 2019-07-20 ENCOUNTER — Ambulatory Visit (HOSPITAL_COMMUNITY)
Admission: RE | Admit: 2019-07-20 | Discharge: 2019-07-20 | Disposition: A | Discharge status: Home / Self Care | Payer: Medicare PPO | Source: Ambulatory Visit | Attending: Radiation Oncology | Admitting: Radiation Oncology

## 2019-07-20 ENCOUNTER — Other Ambulatory Visit: Payer: Self-pay | Admitting: Radiology

## 2019-07-20 ENCOUNTER — Inpatient Hospital Stay (HOSPITAL_COMMUNITY): Payer: Medicare PPO

## 2019-07-20 ENCOUNTER — Encounter (HOSPITAL_COMMUNITY): Payer: Self-pay

## 2019-07-20 ENCOUNTER — Inpatient Hospital Stay (HOSPITAL_COMMUNITY)
Admission: EM | Admit: 2019-07-20 | Discharge: 2019-08-06 | DRG: 435 | Disposition: A | Discharge status: Hospice | Payer: Medicare PPO | Attending: Internal Medicine | Admitting: Internal Medicine

## 2019-07-20 DIAGNOSIS — Z6828 Body mass index (BMI) 28.0-28.9, adult: Secondary | ICD-10-CM | POA: Diagnosis not present

## 2019-07-20 DIAGNOSIS — E872 Acidosis: Secondary | ICD-10-CM | POA: Diagnosis present

## 2019-07-20 DIAGNOSIS — E8770 Fluid overload, unspecified: Secondary | ICD-10-CM | POA: Diagnosis not present

## 2019-07-20 DIAGNOSIS — Z66 Do not resuscitate: Secondary | ICD-10-CM | POA: Diagnosis not present

## 2019-07-20 DIAGNOSIS — Z1501 Genetic susceptibility to malignant neoplasm of breast: Secondary | ICD-10-CM

## 2019-07-20 DIAGNOSIS — G729 Myopathy, unspecified: Secondary | ICD-10-CM

## 2019-07-20 DIAGNOSIS — Z170 Estrogen receptor positive status [ER+]: Secondary | ICD-10-CM | POA: Diagnosis not present

## 2019-07-20 DIAGNOSIS — R262 Difficulty in walking, not elsewhere classified: Secondary | ICD-10-CM | POA: Diagnosis present

## 2019-07-20 DIAGNOSIS — Z853 Personal history of malignant neoplasm of breast: Secondary | ICD-10-CM | POA: Insufficient documentation

## 2019-07-20 DIAGNOSIS — K831 Obstruction of bile duct: Secondary | ICD-10-CM

## 2019-07-20 DIAGNOSIS — R180 Malignant ascites: Secondary | ICD-10-CM | POA: Diagnosis not present

## 2019-07-20 DIAGNOSIS — C07 Malignant neoplasm of parotid gland: Secondary | ICD-10-CM

## 2019-07-20 DIAGNOSIS — R17 Unspecified jaundice: Secondary | ICD-10-CM

## 2019-07-20 DIAGNOSIS — R579 Shock, unspecified: Secondary | ICD-10-CM | POA: Diagnosis present

## 2019-07-20 DIAGNOSIS — C50311 Malignant neoplasm of lower-inner quadrant of right female breast: Secondary | ICD-10-CM | POA: Diagnosis not present

## 2019-07-20 DIAGNOSIS — R160 Hepatomegaly, not elsewhere classified: Secondary | ICD-10-CM | POA: Diagnosis not present

## 2019-07-20 DIAGNOSIS — Z20822 Contact with and (suspected) exposure to covid-19: Secondary | ICD-10-CM | POA: Diagnosis present

## 2019-07-20 DIAGNOSIS — Z85818 Personal history of malignant neoplasm of other sites of lip, oral cavity, and pharynx: Secondary | ICD-10-CM | POA: Diagnosis not present

## 2019-07-20 DIAGNOSIS — C787 Secondary malignant neoplasm of liver and intrahepatic bile duct: Secondary | ICD-10-CM | POA: Insufficient documentation

## 2019-07-20 DIAGNOSIS — Z923 Personal history of irradiation: Secondary | ICD-10-CM | POA: Insufficient documentation

## 2019-07-20 DIAGNOSIS — R7401 Elevation of levels of liver transaminase levels: Secondary | ICD-10-CM | POA: Diagnosis present

## 2019-07-20 DIAGNOSIS — D689 Coagulation defect, unspecified: Secondary | ICD-10-CM | POA: Diagnosis present

## 2019-07-20 DIAGNOSIS — C50911 Malignant neoplasm of unspecified site of right female breast: Secondary | ICD-10-CM | POA: Diagnosis not present

## 2019-07-20 DIAGNOSIS — M1990 Unspecified osteoarthritis, unspecified site: Secondary | ICD-10-CM | POA: Diagnosis present

## 2019-07-20 DIAGNOSIS — C089 Malignant neoplasm of major salivary gland, unspecified: Secondary | ICD-10-CM

## 2019-07-20 DIAGNOSIS — Z79899 Other long term (current) drug therapy: Secondary | ICD-10-CM

## 2019-07-20 DIAGNOSIS — R627 Adult failure to thrive: Secondary | ICD-10-CM | POA: Diagnosis not present

## 2019-07-20 DIAGNOSIS — E785 Hyperlipidemia, unspecified: Secondary | ICD-10-CM | POA: Insufficient documentation

## 2019-07-20 DIAGNOSIS — N179 Acute kidney failure, unspecified: Secondary | ICD-10-CM | POA: Diagnosis present

## 2019-07-20 DIAGNOSIS — Z800 Family history of malignant neoplasm of digestive organs: Secondary | ICD-10-CM

## 2019-07-20 DIAGNOSIS — K219 Gastro-esophageal reflux disease without esophagitis: Secondary | ICD-10-CM | POA: Insufficient documentation

## 2019-07-20 DIAGNOSIS — I959 Hypotension, unspecified: Secondary | ICD-10-CM | POA: Diagnosis present

## 2019-07-20 DIAGNOSIS — Z79811 Long term (current) use of aromatase inhibitors: Secondary | ICD-10-CM | POA: Insufficient documentation

## 2019-07-20 DIAGNOSIS — E871 Hypo-osmolality and hyponatremia: Secondary | ICD-10-CM | POA: Diagnosis present

## 2019-07-20 DIAGNOSIS — E861 Hypovolemia: Secondary | ICD-10-CM | POA: Diagnosis present

## 2019-07-20 DIAGNOSIS — E43 Unspecified severe protein-calorie malnutrition: Secondary | ICD-10-CM | POA: Diagnosis present

## 2019-07-20 DIAGNOSIS — Z515 Encounter for palliative care: Secondary | ICD-10-CM | POA: Diagnosis not present

## 2019-07-20 DIAGNOSIS — Z808 Family history of malignant neoplasm of other organs or systems: Secondary | ICD-10-CM | POA: Diagnosis not present

## 2019-07-20 DIAGNOSIS — Z96641 Presence of right artificial hip joint: Secondary | ICD-10-CM | POA: Insufficient documentation

## 2019-07-20 DIAGNOSIS — Z87891 Personal history of nicotine dependence: Secondary | ICD-10-CM | POA: Insufficient documentation

## 2019-07-20 DIAGNOSIS — R609 Edema, unspecified: Secondary | ICD-10-CM | POA: Diagnosis not present

## 2019-07-20 DIAGNOSIS — R195 Other fecal abnormalities: Secondary | ICD-10-CM | POA: Diagnosis present

## 2019-07-20 DIAGNOSIS — R188 Other ascites: Secondary | ICD-10-CM

## 2019-07-20 DIAGNOSIS — Z888 Allergy status to other drugs, medicaments and biological substances status: Secondary | ICD-10-CM | POA: Diagnosis not present

## 2019-07-20 DIAGNOSIS — D72829 Elevated white blood cell count, unspecified: Secondary | ICD-10-CM | POA: Diagnosis not present

## 2019-07-20 DIAGNOSIS — Z7952 Long term (current) use of systemic steroids: Secondary | ICD-10-CM

## 2019-07-20 DIAGNOSIS — Z7189 Other specified counseling: Secondary | ICD-10-CM | POA: Diagnosis not present

## 2019-07-20 DIAGNOSIS — K7689 Other specified diseases of liver: Secondary | ICD-10-CM | POA: Diagnosis present

## 2019-07-20 DIAGNOSIS — K5790 Diverticulosis of intestine, part unspecified, without perforation or abscess without bleeding: Secondary | ICD-10-CM | POA: Diagnosis present

## 2019-07-20 DIAGNOSIS — C50919 Malignant neoplasm of unspecified site of unspecified female breast: Secondary | ICD-10-CM | POA: Diagnosis not present

## 2019-07-20 DIAGNOSIS — I361 Nonrheumatic tricuspid (valve) insufficiency: Secondary | ICD-10-CM | POA: Diagnosis not present

## 2019-07-20 DIAGNOSIS — G8220 Paraplegia, unspecified: Secondary | ICD-10-CM | POA: Diagnosis not present

## 2019-07-20 DIAGNOSIS — I10 Essential (primary) hypertension: Secondary | ICD-10-CM | POA: Insufficient documentation

## 2019-07-20 DIAGNOSIS — R109 Unspecified abdominal pain: Secondary | ICD-10-CM

## 2019-07-20 DIAGNOSIS — D110 Benign neoplasm of parotid gland: Secondary | ICD-10-CM | POA: Diagnosis present

## 2019-07-20 DIAGNOSIS — Z171 Estrogen receptor negative status [ER-]: Secondary | ICD-10-CM

## 2019-07-20 DIAGNOSIS — D649 Anemia, unspecified: Secondary | ICD-10-CM | POA: Diagnosis not present

## 2019-07-20 DIAGNOSIS — R29898 Other symptoms and signs involving the musculoskeletal system: Secondary | ICD-10-CM | POA: Diagnosis not present

## 2019-07-20 DIAGNOSIS — E876 Hypokalemia: Secondary | ICD-10-CM | POA: Diagnosis present

## 2019-07-20 LAB — RESPIRATORY PANEL BY RT PCR (FLU A&B, COVID)
Influenza A by PCR: NEGATIVE
Influenza B by PCR: NEGATIVE
SARS Coronavirus 2 by RT PCR: NEGATIVE

## 2019-07-20 LAB — CBC
HCT: 39.3 % (ref 36.0–46.0)
Hemoglobin: 13.8 g/dL (ref 12.0–15.0)
MCH: 31.3 pg (ref 26.0–34.0)
MCHC: 35.1 g/dL (ref 30.0–36.0)
MCV: 89.1 fL (ref 80.0–100.0)
Platelets: 185 10*3/uL (ref 150–400)
RBC: 4.41 MIL/uL (ref 3.87–5.11)
RDW: 16.4 % — ABNORMAL HIGH (ref 11.5–15.5)
WBC: 7.8 10*3/uL (ref 4.0–10.5)
nRBC: 0 % (ref 0.0–0.2)

## 2019-07-20 LAB — CBC WITH DIFFERENTIAL/PLATELET
Abs Immature Granulocytes: 0.02 10*3/uL (ref 0.00–0.07)
Basophils Absolute: 0.1 10*3/uL (ref 0.0–0.1)
Basophils Relative: 1 %
Eosinophils Absolute: 0 10*3/uL (ref 0.0–0.5)
Eosinophils Relative: 0 %
HCT: 42.6 % (ref 36.0–46.0)
Hemoglobin: 15.1 g/dL — ABNORMAL HIGH (ref 12.0–15.0)
Immature Granulocytes: 0 %
Lymphocytes Relative: 5 %
Lymphs Abs: 0.4 10*3/uL — ABNORMAL LOW (ref 0.7–4.0)
MCH: 31.7 pg (ref 26.0–34.0)
MCHC: 35.4 g/dL (ref 30.0–36.0)
MCV: 89.3 fL (ref 80.0–100.0)
Monocytes Absolute: 1 10*3/uL (ref 0.1–1.0)
Monocytes Relative: 11 %
Neutro Abs: 7.4 10*3/uL (ref 1.7–7.7)
Neutrophils Relative %: 83 %
Platelets: 207 10*3/uL (ref 150–400)
RBC: 4.77 MIL/uL (ref 3.87–5.11)
RDW: 16.2 % — ABNORMAL HIGH (ref 11.5–15.5)
WBC: 8.8 10*3/uL (ref 4.0–10.5)
nRBC: 0 % (ref 0.0–0.2)

## 2019-07-20 LAB — COMPREHENSIVE METABOLIC PANEL
ALT: 462 U/L — ABNORMAL HIGH (ref 0–44)
AST: 1193 U/L — ABNORMAL HIGH (ref 15–41)
Albumin: 2.8 g/dL — ABNORMAL LOW (ref 3.5–5.0)
Alkaline Phosphatase: 1251 U/L — ABNORMAL HIGH (ref 38–126)
Anion gap: 14 (ref 5–15)
BUN: 35 mg/dL — ABNORMAL HIGH (ref 8–23)
CO2: 28 mmol/L (ref 22–32)
Calcium: 8.5 mg/dL — ABNORMAL LOW (ref 8.9–10.3)
Chloride: 87 mmol/L — ABNORMAL LOW (ref 98–111)
Creatinine, Ser: 1.31 mg/dL — ABNORMAL HIGH (ref 0.44–1.00)
GFR calc Af Amer: 46 mL/min — ABNORMAL LOW (ref >60)
GFR calc non Af Amer: 40 mL/min — ABNORMAL LOW (ref >60)
Glucose, Bld: 135 mg/dL — ABNORMAL HIGH (ref 70–99)
Potassium: 2.6 mmol/L — CL (ref 3.5–5.1)
Sodium: 129 mmol/L — ABNORMAL LOW (ref 135–145)
Total Bilirubin: 22.5 mg/dL (ref 0.3–1.2)
Total Protein: 6.7 g/dL (ref 6.5–8.1)

## 2019-07-20 LAB — LIPASE, BLOOD: Lipase: 143 U/L — ABNORMAL HIGH (ref 11–51)

## 2019-07-20 LAB — HEMOGLOBIN A1C
Hgb A1c MFr Bld: 5.9 % — ABNORMAL HIGH (ref 4.8–5.6)
Mean Plasma Glucose: 122.63 mg/dL

## 2019-07-20 LAB — HEPATIC FUNCTION PANEL
ALT: 378 U/L — ABNORMAL HIGH (ref 0–44)
AST: 1026 U/L — ABNORMAL HIGH (ref 15–41)
Albumin: 2.3 g/dL — ABNORMAL LOW (ref 3.5–5.0)
Alkaline Phosphatase: 1054 U/L — ABNORMAL HIGH (ref 38–126)
Bilirubin, Direct: 12.3 mg/dL — ABNORMAL HIGH (ref 0.0–0.2)
Indirect Bilirubin: 6.5 mg/dL — ABNORMAL HIGH (ref 0.3–0.9)
Total Bilirubin: 18.8 mg/dL (ref 0.3–1.2)
Total Protein: 5.7 g/dL — ABNORMAL LOW (ref 6.5–8.1)

## 2019-07-20 LAB — ACETAMINOPHEN LEVEL: Acetaminophen (Tylenol), Serum: <10 ug/mL — ABNORMAL LOW (ref 10–30)

## 2019-07-20 LAB — CREATININE, SERUM
Creatinine, Ser: 1.38 mg/dL — ABNORMAL HIGH (ref 0.44–1.00)
GFR calc Af Amer: 43 mL/min — ABNORMAL LOW (ref >60)
GFR calc non Af Amer: 37 mL/min — ABNORMAL LOW (ref >60)

## 2019-07-20 LAB — HEPATITIS PANEL, ACUTE
HCV Ab: NONREACTIVE
Hep A IgM: NONREACTIVE
Hep B C IgM: NONREACTIVE
Hepatitis B Surface Ag: NONREACTIVE

## 2019-07-20 LAB — PROTIME-INR
INR: 1.2 (ref 0.8–1.2)
Prothrombin Time: 14.3 seconds (ref 11.4–15.2)

## 2019-07-20 LAB — TSH: TSH: 1.232 u[IU]/mL (ref 0.350–4.500)

## 2019-07-20 LAB — LACTIC ACID, PLASMA: Lactic Acid, Venous: 1.6 mmol/L (ref 0.5–1.9)

## 2019-07-20 MED ORDER — FENTANYL CITRATE (PF) 100 MCG/2ML IJ SOLN
INTRAMUSCULAR | Status: AC | PRN
  Administered 2019-07-20 (×2): 50 ug via INTRAVENOUS

## 2019-07-20 MED ORDER — DIPHENHYDRAMINE HCL 25 MG PO CAPS: 50.0000 mg | ORAL_CAPSULE | Freq: Once | ORAL | Status: AC

## 2019-07-20 MED ORDER — DIPHENHYDRAMINE HCL 50 MG/ML IJ SOLN
50.0000 mg | Freq: Once | INTRAMUSCULAR | Status: AC
  Administered 2019-07-23: 25 mg via INTRAVENOUS

## 2019-07-20 MED ORDER — FAMOTIDINE 20 MG PO TABS
20.0000 mg | ORAL_TABLET | Freq: Two times a day (BID) | ORAL | Status: DC
  Administered 2019-07-20 – 2019-07-25 (×9): 20 mg via ORAL
  Filled 2019-07-20 (×8): qty 1

## 2019-07-20 MED ORDER — SODIUM CHLORIDE 0.9 % IV SOLN: INTRAVENOUS | Status: DC

## 2019-07-20 MED ORDER — LIDOCAINE HCL 1 % IJ SOLN
INTRAMUSCULAR | Status: AC
  Filled 2019-07-20: qty 20

## 2019-07-20 MED ORDER — FENTANYL CITRATE (PF) 100 MCG/2ML IJ SOLN
INTRAMUSCULAR | Status: AC
  Filled 2019-07-20: qty 2

## 2019-07-20 MED ORDER — POTASSIUM CHLORIDE CRYS ER 20 MEQ PO TBCR: 40.0000 meq | EXTENDED_RELEASE_TABLET | Freq: Once | ORAL | Status: DC

## 2019-07-20 MED ORDER — POTASSIUM CHLORIDE 10 MEQ/100ML IV SOLN
INTRAVENOUS | Status: AC
  Administered 2019-07-20: 10 meq
  Filled 2019-07-20: qty 100

## 2019-07-20 MED ORDER — ONDANSETRON HCL 4 MG/2ML IJ SOLN: 4.0000 mg | Freq: Four times a day (QID) | INTRAMUSCULAR | Status: DC | PRN

## 2019-07-20 MED ORDER — POTASSIUM CHLORIDE 10 MEQ/100ML IV SOLN: 10.0000 meq | INTRAVENOUS | Status: DC

## 2019-07-20 MED ORDER — GELATIN ABSORBABLE 12-7 MM EX MISC
CUTANEOUS | Status: AC
  Filled 2019-07-20: qty 1

## 2019-07-20 MED ORDER — SODIUM CHLORIDE 0.9 % IV BOLUS
1000.0000 mL | Freq: Once | INTRAVENOUS | Status: AC
  Administered 2019-07-20: 1000 mL via INTRAVENOUS

## 2019-07-20 MED ORDER — POTASSIUM CHLORIDE CRYS ER 20 MEQ PO TBCR
40.0000 meq | EXTENDED_RELEASE_TABLET | ORAL | Status: AC
  Administered 2019-07-20: 13:00:00 40 meq via ORAL
  Filled 2019-07-20: qty 2

## 2019-07-20 MED ORDER — ONDANSETRON HCL 4 MG PO TABS: 4.0000 mg | ORAL_TABLET | Freq: Four times a day (QID) | ORAL | Status: DC | PRN

## 2019-07-20 MED ORDER — ANASTROZOLE 1 MG PO TABS
1.0000 mg | ORAL_TABLET | Freq: Every day | ORAL | Status: DC
  Administered 2019-07-21 – 2019-08-02 (×12): 1 mg via ORAL
  Filled 2019-07-20 (×13): qty 1

## 2019-07-20 MED ORDER — LIDOCAINE HCL (PF) 1 % IJ SOLN
INTRAMUSCULAR | Status: AC | PRN
  Administered 2019-07-20: 10 mL

## 2019-07-20 MED ORDER — GADOBUTROL 1 MMOL/ML IV SOLN
7.5000 mL | Freq: Once | INTRAVENOUS | Status: AC | PRN
  Administered 2019-07-20: 7.5 mL via INTRAVENOUS

## 2019-07-20 MED ORDER — HEPARIN SODIUM (PORCINE) 5000 UNIT/ML IJ SOLN
5000.0000 [IU] | Freq: Three times a day (TID) | INTRAMUSCULAR | Status: DC
  Administered 2019-07-20 – 2019-07-22 (×7): 5000 [IU] via SUBCUTANEOUS
  Filled 2019-07-20 (×7): qty 1

## 2019-07-20 MED ORDER — SENNOSIDES-DOCUSATE SODIUM 8.6-50 MG PO TABS
1.0000 | ORAL_TABLET | Freq: Every evening | ORAL | Status: DC | PRN
  Administered 2019-08-01 – 2019-08-04 (×2): 1 via ORAL
  Filled 2019-07-20: qty 1

## 2019-07-20 MED ORDER — POTASSIUM CHLORIDE 10 MEQ/100ML IV SOLN
10.0000 meq | INTRAVENOUS | Status: AC
  Administered 2019-07-20 (×3): 10 meq via INTRAVENOUS
  Filled 2019-07-20 (×3): qty 100

## 2019-07-20 MED ORDER — MIDAZOLAM HCL 2 MG/2ML IJ SOLN
INTRAMUSCULAR | Status: AC
  Filled 2019-07-20: qty 2

## 2019-07-20 MED ORDER — MIDAZOLAM HCL 2 MG/2ML IJ SOLN
INTRAMUSCULAR | Status: AC | PRN
  Administered 2019-07-20 (×2): 1 mg via INTRAVENOUS

## 2019-07-20 MED ORDER — OXYCODONE HCL 5 MG PO TABS
5.0000 mg | ORAL_TABLET | ORAL | Status: DC | PRN
  Administered 2019-08-02 – 2019-08-05 (×2): 5 mg via ORAL
  Filled 2019-07-20 (×3): qty 1

## 2019-07-20 NOTE — ED Provider Notes (Signed)
[facility] DEPT Provider Note   CSN: [id number] Arrival date & time: 07/20/19  1423     History Chief Complaint  Patient presents with  . Abnormal Labs  . Hypotension    [name] is a 74 y.o. female.  right breast cancer in 2019, status post lumpectomy and radiation therapy as well as left parotid carcinoma in 2020, status post surgery and radiation therapy.  CT scan on April 19 demonstrated no recurrence of her parotid tumor however did have new liver lesions.  Liver biopsy was performed today, preop labs were obtained and she had profound transaminitis, hyperbilirubinemia.  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  Sent to ER for further eval, admission.  Patient states that she has had fatigue, weight loss over the past month, with past couple weeks has noted yellowing of her skin.  10 pound weight loss.  Poor appetite, occasional nausea, 1 episode nonbloody nonbilious emesis this morning.  Currently not in any pain.  Has not had any fevers recently.  HPI     Past Medical History:  Diagnosis Date  . Arthritis   . Cancer ([facility]) 06/2017   right breast cancer  . GERD (gastroesophageal reflux disease)    mild- TUMS used sparingly  . History of radiation therapy 08/05/18- 09/22/18   Left Parotid 33 fractions for a total of 66 Gy.   Marland Kitchen History of radiation therapy 09/01/2017- 09/29/2017   Right Breast + seroma boost/ total of 50.06 Gy   . Hyperlipidemia   . Hypertension   . PONV (postoperative nausea and vomiting)    also "passed out with cyst removal from neck"superficial"  . Syncope    x2 episodes-evaluated and findings normal- "usually related to seeing blood or thought of it"  . Vaso vagal episode    easily  . Vertigo    She reports several episodes of vertigo. She tells me something black came out of her ear,  and she has not had an episode since then.     Patient Active Problem List   Diagnosis Date Noted  . 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 Cancer of parotid gland ([place]) 07/24/2018  . 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 Parotid mass 11/28/2017  . Malignant neoplasm of lower-inner quadrant of right breast of female, estrogen receptor positive ([facility]) 07/22/2017  . Appendicitis with perforation 03/27/2017  . OA (osteoarthritis) of hip 11/29/2015    Past Surgical History:  Procedure Laterality Date  . APPENDECTOMY    . BREAST LUMPECTOMY WITH RADIOACTIVE SEED AND SENTINEL LYMPH NODE BIOPSY Right 07/30/2017   Procedure: BREAST LUMPECTOMY WITH RADIOACTIVE SEED AND SENTINEL LYMPH NODE BIOPSY;  Surgeon: [name], MD;  Location: [facility];  Service: General;  Laterality: Right;  . BREAST SURGERY  08/27/11   right lumpectomy w/needle localization  . BUNIONECTOMY Bilateral 2000   bilateral -hardware remains  . CYST REMOVAL NECK     sebaceous  cyst from neck  . DILATION AND CURETTAGE OF UTERUS    . INTRAUTERINE DEVICE INSERTION    . IUD REMOVAL    . LAPAROSCOPIC APPENDECTOMY N/A 03/27/2017   Procedure: APPENDECTOMY LAPAROSCOPIC;  Surgeon: Alphonsa Overall, MD;  Location: [facility];  Service: General;  Laterality: N/A;  . PAROTIDECTOMY Left 11/28/2017   SUPERFICIAL  . PAROTIDECTOMY Left 11/28/2017   Procedure: LEFT SUPEFICIAL PAROTIDECTOMY WITH OBSERVATION;  Surgeon: Jerrell Belfast, MD;  Location: [facility];  Service: ENT;  Laterality: Left;  . PAROTIDECTOMY Left 06/29/2018   Dr. Nicolette [facility] at [facility]. Deep parotidectomy  . Removal Right Straphenous vein  2011  . TONSILLECTOMY     child  . TOTAL HIP ARTHROPLASTY Right 11/29/2015   Procedure: RIGHT TOTAL HIP ARTHROPLASTY ANTERIOR APPROACH;  Surgeon: Gaynelle Arabian, MD;  Location: [facility];  Service: Orthopedics;  Laterality: Right;     OB History   No obstetric history on file.     Family History  Problem  Relation Age of Onset  . Cancer Mother        benign brain tumor   . Cancer Paternal Aunt        unknown type     Social History   Tobacco Use  . Smoking status: Former Smoker    Packs/day: 0.50    Years: 10.00    Pack years: 5.00    Quit date: 11/21/1985    Years since quitting: 33.6  . Smokeless tobacco: Never Used  Substance Use Topics  . Alcohol use: Yes    Comment: Socially  . Drug use: No    Home Medications Prior to Admission medications   Medication Sig Start Date End Date Taking? Authorizing Provider  anastrozole (ARIMIDEX) 1 MG tablet TAKE 1 TABLET(1 MG) BY MOUTH DAILY Patient taking differently: Take 1 mg by mouth daily. TAKE 1 TABLET(1 MG) BY MOUTH DAILY 10/09/18  Yes Truitt Merle, MD  atorvastatin (LIPITOR) 80 MG tablet Take 80 mg by mouth every evening.   Yes [provider]  Calcium-Phosphorus-Vitamin D (CITRACAL +D3 PO) Take 1 tablet by mouth daily.   Yes [provider]  Cholecalciferol (VITAMIN D3) 2000 units TABS Take 2,000 Units by mouth daily.   Yes [provider]  famotidine (PEPCID) 20 MG tablet Take 20 mg by mouth 2 (two) times daily. Morning and bedtime   Yes [provider]  FINACEA 15 % cream Apply 1 application topically daily as needed (rosacea). Applied to nose. 09/13/15  Yes [provider]  LUTEIN-ZEAXANTHIN PO Take 1 tablet by mouth daily.   Yes [provider]  MICARDIS HCT 40-12.5 MG per tablet Take 1 tablet by mouth daily.  06/26/11  Yes [provider]  ZETIA 10 MG tablet Take 10 mg by mouth every evening.  09/16/11  Yes [provider]  diphenhydrAMINE (BENADRYL) 50 MG tablet Take 1 tablet at 1 hour before CT scans. Patient not taking: Reported on 07/06/2019 06/04/19   Eppie Gibson, MD  predniSONE (DELTASONE) 50 MG tablet Take 1 tablet at 13 hours, 7 hours, and 1 hour before CT scans. Patient not taking: Reported on 07/20/2019 06/04/19   Eppie Gibson, MD    Allergies     Dermacinrx surgical pharmapak and Iohexol  Review of Systems   Review of Systems  Constitutional: Positive for fatigue and unexpected weight change. Negative for chills and fever.  HENT: Negative for ear pain and sore throat.   Eyes: Negative for pain and visual disturbance.  Respiratory: Negative for cough and shortness of breath.   Cardiovascular: Negative for chest pain and palpitations.  Gastrointestinal: Positive for abdominal distention, nausea and vomiting. Negative for abdominal pain.  Genitourinary: Negative for dysuria and hematuria.  Musculoskeletal: Negative for arthralgias and back pain.  Skin: Negative for color change and rash.  Neurological: Negative for seizures and syncope.  All other systems reviewed and are negative.   Physical Exam Updated Vital Signs BP (!) 107/57  Pulse 73   Temp 97.9 F (36.6 C) (Oral)   Resp 16   SpO2 99%   Physical Exam Vitals and nursing note reviewed.  Constitutional:      General: She is not in acute distress.    Appearance: She is well-developed.     Comments: Yellow skin  HENT:     Head: Normocephalic and atraumatic.  Eyes:     General: Scleral icterus present.     Conjunctiva/sclera: Conjunctivae normal.     Pupils: Pupils are equal, round, and reactive to light.  Cardiovascular:     Rate and Rhythm: Normal rate and regular rhythm.     Heart sounds: No murmur.  Pulmonary:     Effort: Pulmonary effort is normal. No respiratory distress.     Breath sounds: Normal breath sounds.  Abdominal:     Palpations: Abdomen is soft.     Tenderness: There is no abdominal tenderness.     Comments: No palpable masses  Musculoskeletal:        General: No deformity or signs of injury.     Cervical back: Neck supple.  Skin:    General: Skin is warm and dry.     Coloration: Skin is jaundiced.     Comments: Jaundiced  Neurological:     General: No focal deficit present.     Mental Status: She is alert and oriented to person,  place, and time.  Psychiatric:        Mood and Affect: Mood normal.        Behavior: Behavior normal.     ED Results / Procedures / Treatments   Labs (all labs ordered are listed, but only abnormal results are displayed) Labs Reviewed  LIPASE, BLOOD - Abnormal; Notable for the following components:      Result Value   Lipase 143 (*)    All other components within normal limits  HEPATIC FUNCTION PANEL - Abnormal; Notable for the following components:   Total Protein 5.7 (*)    Albumin 2.3 (*)    AST 1,026 (*)    ALT 378 (*)    Alkaline Phosphatase 1,054 (*)    Total Bilirubin 18.8 (*)    All other components within normal limits  ACETAMINOPHEN LEVEL - Abnormal; Notable for the following components:   Acetaminophen (Tylenol), Serum <10 (*)    All other components within normal limits  RESPIRATORY PANEL BY RT PCR (FLU A&B, COVID)  LACTIC ACID, PLASMA  HEPATITIS PANEL, ACUTE  LACTIC ACID, PLASMA  URINALYSIS, ROUTINE W REFLEX MICROSCOPIC    EKG None  Radiology [place] CORE BIOPSY (LIVER)  Result Date: 07/20/2019 INDICATION: 74 year old female with a past history of both breast and carotid carcinoma. She now has extensive multifocal liver lesions and presents for ultrasound-guided core biopsy of the same. EXAM: ULTRASOUND BIOPSY CORE LIVER MEDICATIONS: None. ANESTHESIA/SEDATION: Moderate (conscious) sedation was employed during this procedure. A total of Versed 2 mg and Fentanyl 100 mcg was administered intravenously. Moderate Sedation Time: 10 minutes. The patient's level of consciousness and vital signs were monitored continuously by radiology nursing throughout the procedure under my direct supervision. FLUOROSCOPY TIME:  None COMPLICATIONS: None immediate. PROCEDURE: Informed written consent was obtained from the patient after a thorough discussion of the procedural risks, benefits and alternatives. All questions were addressed. A timeout was performed prior to the initiation of the  procedure. The liver was interrogated with ultrasound. There are numerous hypoechoic solid lesions scattered throughout the liver. Additionally, there is intrahepatic biliary  duct dilatation involving both the right and left hepatic lobes. The common bile duct is mildly dilated at 8 mm. The gallbladder is contracted.  No stones are visualized. A suitable lesion was identified in segment 4 B of the right liver. A skin entry site was selected and marked. The overlying skin was sterilely prepped and draped in the standard fashion using chlorhexidine skin prep. Local anesthesia was attained by infiltration with 1% lidocaine. A small dermatotomy was made. Under real-time sonographic guidance, a 17 gauge introducer needle was advanced into the margin of the mass. Multiple 18 gauge core biopsies were then coaxially obtained using the Bio Pince automated biopsy device. Biopsy specimens were placed in formalin and delivered to pathology for further analysis. As the introducer needle was removed, the biopsy tract was embolized with a Gel-Foam slurry. Post biopsy ultrasound imaging demonstrates no evidence of immediate complication. IMPRESSION: Ultrasound-guided core biopsy of hepatic lesion. Incidentally noted, there is significant intra and extrahepatic biliary ductal dilatation which is new/progressed compared to the incomplete CT imaging from July 05, 2019. PLAN: Laboratory and imaging findings suggesting malignant obstructed jaundice and associated hyperbilirubinemia were discussed with the patient's radiation oncologist, Dr. Isidore Moos. Doctor [place] office is ranging to have the patient direct admitted to the hospital by the hospitalist service. Electronically Signed   By: Jacqulynn Cadet M.D.   On: 07/20/2019 15:24    Procedures Procedures (including critical care time)  Medications Ordered in ED Medications  sodium chloride 0.9 % bolus 1,000 mL (0 mLs Intravenous Hold 07/20/19 1514)  diphenhydrAMINE (BENADRYL)  capsule 50 mg (has no administration in time range)    Or  diphenhydrAMINE (BENADRYL) injection 50 mg (has no administration in time range)  sodium chloride 0.9 % bolus 1,000 mL (0 mLs Intravenous Stopped 07/20/19 1602)    ED Course  I have reviewed the triage vital signs and the nursing notes.  Pertinent labs & imaging results that were available during my care of the patient were reviewed by me and considered in my medical decision making (see chart for details).  Clinical Course as of Jul 19 1628  Tue Jul 20, 2019  1533 Discussed with M[place]ull[place], he has reviewed with oncology, recommends obtaining MRCP, GI consult, hospitalist admit   [RD]  1534 Likely GI to place stent via ERCP or would need biliary drain   [RD]    Clinical Course User Index [RD] Lucrezia Starch, MD   MDM Rules/Calculators/A&P                      74 year old lady past medical history of breast cancer, parotid cancer presenting to ER with concern for abnormal ultrasound, hyperbilirubinemia.  Ultrasound concerning for biliary duct dilatation, malignant obstructive jaundice.  Radiologist recommending MRCP to further eval.  Will consult GI, admitted to hospitalist service Eric [place] to admit.  Noted borderline soft BP initially, she has no fever, lower suspicion for sepsis, suspect dehydration.  Provided fluids and blood pressure resolved.  Preop lab work also noted hypokalemia which we will replace.   Final Clinical Impression(s) / ED Diagnoses Final diagnoses:  Bilirubinemia  Hyperbilirubinemia  Liver mass    Rx / DC Orders ED Discharge Orders    None       Lucrezia Starch, MD 07/20/19 [phone]

## 2019-07-20 NOTE — ED Triage Notes (Signed)
Pt arriving from PreOp/Short Stay, came for IR liver lesion biopsy today, Hx of breast and Parodied CA, Labs prior to procedure today reported K = 2.6, Bilirubin = 22.5, liver lesion biopsy completed, 2mg  Versed and 100 mcgm Fentanyl administered 20 minutes ago. Pt awake and alert on arrival.   Biopsy site Right middle abdominal.  BP 99/60 initially, During procedure BP 60/30 Given 106ml NS BP Post 87/47

## 2019-07-20 NOTE — Progress Notes (Signed)
Patient to be admitted to hospital by hospitalist. To be transported to ER for admission.

## 2019-07-20 NOTE — Sedation Documentation (Signed)
NS wide open due to low B/P MD aware

## 2019-07-20 NOTE — Progress Notes (Signed)
Patient ID: [name], female   DOB: 1945/06/09, 74 y.o.   MRN: [id number] Dr. Isidore Moos notified of patient's abnormal lab work today.  We will contact her office following liver biopsy to arrange for any additional work-up or possible admission.

## 2019-07-20 NOTE — Consult Note (Signed)
Chief Complaint: Patient was seen in consultation today for image guided liver lesion biopsy  Referring Physician(s): Squire,Sarah/Feng,Y  Supervising Physician: Jacqulynn Cadet  Patient Status: [facility] - Out-pt  History of Present Illness: [name] is a 74 y.o. female, ex smoker,  with past medical history of right breast cancer in 2019, status post lumpectomy and radiation therapy as well as left parotid carcinoma in 2020, status post surgery and radiation therapy.  Follow-up imaging on 07/05/2019 has revealed interval development of multiple ill-defined low-attenuation liver lesions worrisome for metastatic disease.  She presents today for image guided liver lesion biopsy for further evaluation.  Additional medical history as below.  Past Medical History:  Diagnosis Date  . Arthritis   . Cancer ([place]) 06/2017   right breast cancer  . GERD (gastroesophageal reflux disease)    mild- TUMS used sparingly  . History of radiation therapy 08/05/18- 09/22/18   Left Parotid 33 fractions for a total of 66 Gy.   Marland Kitchen History of radiation therapy 09/01/2017- 09/29/2017   Right Breast + seroma boost/ total of 50.06 Gy   . Hyperlipidemia   . Hypertension   . PONV (postoperative nausea and vomiting)    also "passed out with cyst removal from neck"superficial"  . Syncope    x2 episodes-evaluated and findings normal- "usually related to seeing blood or thought of it"  . Vaso vagal episode    easily  . Vertigo    She reports several episodes of vertigo. She tells me something black came out of her ear, and she has not had an episode since then.     Past Surgical History:  Procedure Laterality Date  . APPENDECTOMY    . BREAST LUMPECTOMY WITH RADIOACTIVE SEED AND SENTINEL LYMPH NODE BIOPSY Right 07/30/2017   Procedure: BREAST LUMPECTOMY WITH RADIOACTIVE SEED AND SENTINEL LYMPH NODE BIOPSY;  Surgeon: [name], MD;  Location: [facility];  Service: General;  Laterality: Right;   . BREAST SURGERY  08/27/11   right lumpectomy w/needle localization  . BUNIONECTOMY Bilateral 2000   bilateral -hardware remains  . CYST REMOVAL NECK     sebaceous  cyst from neck  . DILATION AND CURETTAGE OF UTERUS    . INTRAUTERINE DEVICE INSERTION    . IUD REMOVAL    . LAPAROSCOPIC APPENDECTOMY N/A 03/27/2017   Procedure: APPENDECTOMY LAPAROSCOPIC;  Surgeon: Alphonsa Overall, MD;  Location: [facility];  Service: General;  Laterality: N/A;  . PAROTIDECTOMY Left 11/28/2017   SUPERFICIAL  . PAROTIDECTOMY Left 11/28/2017   Procedure: LEFT SUPEFICIAL PAROTIDECTOMY WITH OBSERVATION;  Surgeon: Jerrell Belfast, MD;  Location: [facility];  Service: ENT;  Laterality: Left;  . PAROTIDECTOMY Left 06/29/2018   Dr. Nicolette [facility] at [facility]. Deep parotidectomy  . Removal Right Straphenous vein  2011  . TONSILLECTOMY     child  . TOTAL HIP ARTHROPLASTY Right 11/29/2015   Procedure: RIGHT TOTAL HIP ARTHROPLASTY ANTERIOR APPROACH;  Surgeon: Gaynelle Arabian, MD;  Location: [facility];  Service: Orthopedics;  Laterality: Right;    Allergies: Dermacinrx surgical pharmapak and Iohexol  Medications: Prior to Admission medications   Medication Sig Start Date End Date Taking? Authorizing Provider  anastrozole (ARIMIDEX) 1 MG tablet TAKE 1 TABLET(1 MG) BY MOUTH DAILY 10/09/18   Truitt Merle, MD  atorvastatin (LIPITOR) 80 MG tablet Take 80 mg by mouth every evening.    [provider]  Calcium-Phosphorus-Vitamin D (CITRACAL +D3 PO) Take 1 tablet by mouth daily.    [provider]  Cholecalciferol (VITAMIN D3) 2000 units TABS Take 2,000 Units by mouth daily.    [provider]  diphenhydrAMINE (BENADRYL) 50 MG tablet Take 1 tablet at 1 hour before CT scans. Patient not taking: Reported on 07/06/2019 06/04/19   Eppie Gibson, MD  famotidine (PEPCID) 20 MG tablet Take 20 mg by mouth 2 (two) times daily. Morning and bedtime    [provider]  FINACEA 15 % cream Apply 1 application  topically daily. Applied to nose. 09/13/15   [provider]  LUTEIN-ZEAXANTHIN PO Take 1 tablet by mouth daily.    [provider]  MICARDIS HCT 40-12.5 MG per tablet Take 1 tablet by mouth daily.  06/26/11   [provider]  predniSONE (DELTASONE) 50 MG tablet Take 1 tablet at 13 hours, 7 hours, and 1 hour before CT scans. 06/04/19   Eppie Gibson, MD  ZETIA 10 MG tablet Take 10 mg by mouth every evening.  09/16/11   [provider]     Family History  Problem Relation Age of Onset  . Cancer Mother        benign brain tumor   . Cancer Paternal Aunt        unknown type     Social History   Socioeconomic History  . Marital status: Married    Spouse name: Not on file  . Number of children: Not on file  . Years of education: Not on file  . Highest education level: Not on file  Occupational History  . Not on file  Tobacco Use  . Smoking status: Former Smoker    Packs/day: 0.50    Years: 10.00    Pack years: 5.00    Quit date: 11/21/1985    Years since quitting: 33.6  . Smokeless tobacco: Never Used  Substance and Sexual Activity  . Alcohol use: Yes    Comment: Socially  . Drug use: No  . Sexual activity: Yes  Other Topics Concern  . Not on file  Social History Narrative  . Not on file   Social Determinants of Health   Financial Resource Strain:   . Difficulty of Paying Living Expenses:   Food Insecurity:   . Worried About Charity fundraiser in the Last Year:   . Arboriculturist in the Last Year:   Transportation Needs: No Transportation Needs  . Lack of Transportation (Medical): No  . Lack of Transportation (Non-Medical): No  Physical Activity:   . Days of Exercise per Week:   . Minutes of Exercise per Session:   Stress:   . Feeling of Stress :   Social Connections:   . Frequency of Communication with Friends and Family:   . Frequency of Social Gatherings with Friends and Family:   . Attends Religious Services:   . Active Member  of Clubs or Organizations:   . Attends Archivist Meetings:   Marland Kitchen Marital Status:      Review of Systems she currently denies fever, headache, chest pain, dyspnea, cough, abdominal/back pain; she does have intermittent nausea and vomiting, constipation, nosebleed last week as well as weight loss and jaundice/itching.  Vital Signs: BP 107/67   Pulse 79   Temp 98 F (36.7 C) (Oral)   Resp 18   Ht 5\' 5"  (1.651 m)   Wt 170 lb (77.1 kg)   SpO2 100%   BMI 28.29 kg/m   Physical Exam patient awake, alert.  Markedly jaundiced; chest with few faint  basilar crackles.  Heart with regular rate and rhythm.  Abdomen soft, positive bowel sounds, currently nontender.  No significant lower extremity edema.  Imaging: CT Soft Tissue Neck W Contrast  Result Date: 07/05/2019 CLINICAL DATA:  Follow-up parotid cancer status post resection and radiation therapy. EXAM: CT NECK WITH CONTRAST TECHNIQUE: Multidetector CT imaging of the neck was performed using the standard protocol following the bolus administration of intravenous contrast. CONTRAST:  152mL OMNIPAQUE IOHEXOL 300 MG/ML  SOLN COMPARISON:  12/17/2018 FINDINGS: Pharynx and larynx: No evidence of mass or swelling. Salivary glands: Status post left parotidectomy with decreased postsurgical stranding and soft tissue thickening. No recurrent mass in the parotidectomy bed. Unremarkable appearance of the right parotid and right submandibular glands. Postradiation changes in the left submandibular gland. Thyroid: Unremarkable. Lymph nodes: No enlarged or suspicious lymph nodes in the neck. Vascular: Major vascular structures of the neck are patent with small volume atherosclerotic plaque at the carotid bifurcation and partially visualized bilateral intracranial carotid siphon atherosclerosis. Limited intracranial: Unremarkable. Visualized orbits: Unremarkable. Mastoids and visualized paranasal sinuses: Clear. Skeleton: No suspicious osseous lesion. Severe  left facet arthrosis at C4-5 with unchanged grade 1 anterolisthesis. Disc degeneration greatest at C5-6 where there is moderate right neural foraminal stenosis due to uncovertebral spurring. Upper chest: Reported separately. Other: None. IMPRESSION: No evidence of recurrent disease in the neck. Electronically Signed   By: Logan Bores M.D.   On: 07/05/2019 13:55   CT Chest W Contrast  Result Date: 07/05/2019 CLINICAL DATA:  Restaging parotid gland neoplasm. EXAM: CT CHEST WITH CONTRAST TECHNIQUE: Multidetector CT imaging of the chest was performed during intravenous contrast administration. CONTRAST:  169mL OMNIPAQUE IOHEXOL 300 MG/ML  SOLN COMPARISON:  12/17/2018 FINDINGS: Cardiovascular: The heart size appears within normal limits. Aortic atherosclerosis. Mediastinum/Nodes: Normal appearance of the thyroid gland. The trachea appears patent and is midline. Normal appearance of the esophagus. No axillary or supraclavicular adenopathy. No mediastinal or hilar adenopathy. Lungs/Pleura: No suspicious pulmonary nodule or mass. No pleural effusion, airspace consolidation, or atelectasis. Upper Abdomen: Since the previous exam there is been interval development of multiple ill-defined low attenuation liver lesions, worrisome for metastatic disease. The largest lesion is in the inferior right hepatic lobe, segment 6/7 measuring approximately 6.6 cm, image 65/3. Segment 8 liver lesion measures 1.7 cm, image 50/3. Musculoskeletal: No acute bone abnormality. No suspicious bone lesions. IMPRESSION: 1. No acute cardiopulmonary abnormalities. No mass or adenopathy identified within the chest. 2. Interval development of multiple ill-defined low attenuation liver lesions, worrisome for metastatic disease. 3. Aortic atherosclerosis. Aortic Atherosclerosis (ICD10-I70.0). Electronically Signed   By: Kerby Moors M.D.   On: 07/05/2019 12:14    Labs:  CBC: Recent Labs    08/26/18 0935 12/17/18 1150  WBC 5.2 8.7  HGB  13.5 14.8  HCT 41.0 44.2  PLT 189 199    COAGS: No results for input(s): INR, APTT in the last 8760 hours.  BMP: Recent Labs    08/26/18 0935 12/17/18 1150 07/05/19 0753  NA 139 141  --   K 4.2 3.8  --   CL 103 106  --   CO2 27 26  --   GLUCOSE 96 134*  --   BUN 12 17 19   CALCIUM 9.8 10.3  --   CREATININE 0.83 0.83 1.03*  GFRNONAA >60 >60 53*  GFRAA >60 >60 >60    LIVER FUNCTION TESTS: Recent Labs    08/26/18 0935 12/17/18 1150  BILITOT 0.9 0.7  AST 21 25  ALT  21 27  ALKPHOS 66 69  PROT 7.2 7.8  ALBUMIN 4.3 4.7    TUMOR MARKERS: No results for input(s): AFPTM, CEA, CA199, CHROMGRNA in the last 8760 hours.  Assessment and Plan: 74 y.o. female, ex smoker,  with past medical history of right breast cancer in 2019, status post lumpectomy and radiation therapy as well as left parotid carcinoma in 2020, status post surgery and radiation therapy.  Follow-up imaging on 07/05/2019 has revealed interval development of multiple ill-defined low-attenuation liver lesions worrisome for metastatic disease.  She presents today for image guided liver lesion biopsy for further evaluation. Risks and benefits of procedure was discussed with the patient  including, but not limited to bleeding, infection, damage to adjacent structures or low yield requiring additional tests.  All of the questions were answered and there is agreement to proceed.  Consent signed and in chart.  LABS PENDING   Thank you for this interesting consult.  I greatly enjoyed meeting [name] and look forward to participating in their care.  A copy of this report was sent to the requesting provider on this date.  Electronically Signed: D. Rowe Robert, PA-C 07/20/2019, 11:40 AM   I spent a total of  25 minutes   in face to face  clinical consultation, greater than 50% of which was counseling/coordinating care for image guided liver lesion biopsy

## 2019-07-20 NOTE — Progress Notes (Signed)
Called RN about pt to see if we can scan her while in the ER was told pt was headed upstairs within a few minutes. Called  Rn back 20 minutes later transport was called. Pt MRI will be delayed until she gets on the floor.

## 2019-07-20 NOTE — Progress Notes (Signed)
CRITICAL VALUE ALERT  Critical Value:  Potassium 2.6    Total Bilirubin 22.2   Date & Time Notied:  07/20/2019  Provider Notified: Rowe Robert, PA  Orders Received/Actions taken: Verbal order to give 40 mEq Potassium

## 2019-07-20 NOTE — H&P (Signed)
History and Physical    QUANTASIA WAMBLES [id number] DOB: 19-Jan-1946 DOA: 07/20/2019  PCP: [name], MD  Patient coming from:   I have personally briefly reviewed patient's old medical records in [place]  Chief Complaint: Nausea, dizziness, skin discoloration, decreased appetite  HPI: [name] is a 74 y.o. female with medical history significant of essential hypertension, hyperlipidemia, history of breast/parotid cancer, GERD who presented to the ED from interventional radiology for concerns of electrolyte disturbance.  Patient reports history of skin discoloration onset 7 days ago associated with some nausea, vomiting, decreased appetite, dizziness, weakness and fatigue that has been progressing over the last month.  Denies abdominal discomfort.  Patient recently on 07/05/2019 had concerning findings of liver lesions during CT chest for interval screening of history of parotid cancer.  Patient's oncologist, Dr. Lanell Persons referred her for a guided biopsy of the liver lesions by IR today, preoperative labs were concerning for hyponatremia and significant hypokalemia with elevated total bilirubin and AST/ALT.  Patient underwent ultrasound-guided biopsy by IR today.  Given these findings and hypotension, patient was directed to the emergency department for further evaluation.  ED Course: Temperature 97.9, HR 73, RR 16, BP 101/55 (66/36 initially), SPO2 99% on room air.  WBC count 8.8, hemoglobin 15.1, platelet 207.  Sodium 129, potassium 2.6, chloride 87, CO2 28, BUN 35, creatinine 1.31, glucose 135.  Total bilirubin 22.5, AST 1193, ALT 462.  Patient was given 2 L NS bolus, Benadryl.  TRH was consulted for admission given significant electrolyte abnormalities and concerning liver lesions for possible metastasis and further work-up.  Review of Systems: As per HPI otherwise 10 point review of systems negative.    Past Medical History:  Diagnosis Date  . Arthritis   . Cancer ([place])  06/2017   right breast cancer  . GERD (gastroesophageal reflux disease)    mild- TUMS used sparingly  . History of radiation therapy 08/05/18- 09/22/18   Left Parotid 33 fractions for a total of 66 Gy.   Marland Kitchen History of radiation therapy 09/01/2017- 09/29/2017   Right Breast + seroma boost/ total of 50.06 Gy   . Hyperlipidemia   . Hypertension   . PONV (postoperative nausea and vomiting)    also "passed out with cyst removal from neck"superficial"  . Syncope    x2 episodes-evaluated and findings normal- "usually related to seeing blood or thought of it"  . Vaso vagal episode    easily  . Vertigo    She reports several episodes of vertigo. She tells me something black came out of her ear, and she has not had an episode since then.     Past Surgical History:  Procedure Laterality Date  . APPENDECTOMY    . BREAST LUMPECTOMY WITH RADIOACTIVE SEED AND SENTINEL LYMPH NODE BIOPSY Right 07/30/2017   Procedure: BREAST LUMPECTOMY WITH RADIOACTIVE SEED AND SENTINEL LYMPH NODE BIOPSY;  Surgeon: [name], MD;  Location: [facility];  Service: General;  Laterality: Right;  . BREAST SURGERY  08/27/11   right lumpectomy w/needle localization  . BUNIONECTOMY Bilateral 2000   bilateral -hardware remains  . CYST REMOVAL NECK     sebaceous  cyst from neck  . DILATION AND CURETTAGE OF UTERUS    . INTRAUTERINE DEVICE INSERTION    . IUD REMOVAL    . LAPAROSCOPIC APPENDECTOMY N/A 03/27/2017   Procedure: APPENDECTOMY LAPAROSCOPIC;  Surgeon: Alphonsa Overall, MD;  Location: [facility];  Service: General;  Laterality: N/A;  . PAROTIDECTOMY Left  11/28/2017   SUPERFICIAL  . PAROTIDECTOMY Left 11/28/2017   Procedure: LEFT SUPEFICIAL PAROTIDECTOMY WITH OBSERVATION;  Surgeon: Jerrell Belfast, MD;  Location: [facility];  Service: ENT;  Laterality: Left;  . PAROTIDECTOMY Left 06/29/2018   Dr. Nicolette Bang at [facility]. Deep parotidectomy  . Removal Right Straphenous vein  2011  . TONSILLECTOMY      child  . TOTAL HIP ARTHROPLASTY Right 11/29/2015   Procedure: RIGHT TOTAL HIP ARTHROPLASTY ANTERIOR APPROACH;  Surgeon: Gaynelle Arabian, MD;  Location: [facility];  Service: Orthopedics;  Laterality: Right;     reports that she quit smoking about 33 years ago. She has a 5.00 pack-year smoking history. She has never used smokeless tobacco. She reports current alcohol use. She reports that she does not use drugs.  Allergies  Allergen Reactions  . Dermacinrx [facility] [place] Dermatitis    Dermabond caused contact dermatitis   . Iohexol Hives    Patient broke out in hives, needs pre meds if given contrast in the future per Dr. Carlis Abbott // rls     Family History  Problem Relation Age of Onset  . Cancer Mother        benign brain tumor   . Cancer Paternal Aunt        unknown type     Family history reviewed and not pertinent   Prior to Admission medications   Medication Sig Start Date End Date Taking? Authorizing Provider  anastrozole (ARIMIDEX) 1 MG tablet TAKE 1 TABLET(1 MG) BY MOUTH DAILY Patient taking differently: Take 1 mg by mouth daily. TAKE 1 TABLET(1 MG) BY MOUTH DAILY 10/09/18  Yes Truitt Merle, MD  atorvastatin (LIPITOR) 80 MG tablet Take 80 mg by mouth every evening.   Yes [provider]  Calcium-Phosphorus-Vitamin D (CITRACAL +D3 PO) Take 1 tablet by mouth daily.   Yes [provider]  Cholecalciferol (VITAMIN D3) 2000 units TABS Take 2,000 Units by mouth daily.   Yes [provider]  famotidine (PEPCID) 20 MG tablet Take 20 mg by mouth 2 (two) times daily. Morning and bedtime   Yes [provider]  FINACEA 15 % cream Apply 1 application topically daily as needed (rosacea). Applied to nose. 09/13/15  Yes [provider]  LUTEIN-ZEAXANTHIN PO Take 1 tablet by mouth daily.   Yes [provider]  MICARDIS HCT 40-12.5 MG per tablet Take 1 tablet by mouth daily.  06/26/11  Yes [provider]  ZETIA 10 MG tablet Take 10 mg by  mouth every evening.  09/16/11  Yes [provider]  diphenhydrAMINE (BENADRYL) 50 MG tablet Take 1 tablet at 1 hour before CT scans. Patient not taking: Reported on 07/06/2019 06/04/19   Eppie Gibson, MD  predniSONE (DELTASONE) 50 MG tablet Take 1 tablet at 13 hours, 7 hours, and 1 hour before CT scans. Patient not taking: Reported on 07/20/2019 06/04/19   Eppie Gibson, MD    Physical Exam: Vitals:   07/20/19 1515 07/20/19 1530 07/20/19 1545 07/20/19 1600  BP: (!) 114/59 114/67 (!) 98/58 (!) 107/57  Pulse:    73  Resp:    16  Temp:      TempSrc:      SpO2:    99%    Constitutional: NAD, calm, comfortable, significantly jaundiced in appearance Eyes: PERRL, scleral icterus noted ENMT: Mucous membranes are moist. Posterior pharynx clear of any exudate or lesions.Normal dentition.  Notable subungual jaundice Neck: normal, supple, no masses, no thyromegaly Respiratory: clear to auscultation bilaterally,  no wheezing, no crackles. Normal respiratory effort. No accessory muscle use.  Cardiovascular: Regular rate and rhythm, no murmurs / rubs / gallops. No extremity edema. 2+ pedal pulses. No carotid bruits.  Abdomen: no tenderness, no masses palpated. No hepatosplenomegaly. Bowel sounds positive.  Musculoskeletal: no clubbing / cyanosis. No joint deformity upper and lower extremities. Good ROM, no contractures. Normal muscle tone.  Skin: Diffuse jaundice Neurologic: CN 2-12 grossly intact. Sensation intact, DTR normal. Strength 5/5 in all 4.  Psychiatric: Normal judgment and insight. Alert and oriented x 3. Normal mood.    Labs on Admission: I have personally reviewed following labs and imaging studies  CBC: Recent Labs  Lab 07/20/19 1130  WBC 8.8  NEUTROABS 7.4  HGB 15.1*  HCT 42.6  MCV 89.3  PLT [id number]   Basic Metabolic Panel: Recent Labs  Lab 07/20/19 1130  NA 129*  K 2.6*  CL 87*  CO2 28  GLUCOSE 135*  BUN 35*  CREATININE 1.31*  CALCIUM 8.5*   GFR: Estimated  Creatinine Clearance: 38.1 mL/min (A) (by C-G formula based on SCr of 1.31 mg/dL (H)). Liver Function Tests: Recent Labs  Lab 07/20/19 1130 07/20/19 1511  AST 1,193* 1,026*  ALT 462* 378*  ALKPHOS 1,251* 1,054*  BILITOT 22.5* 18.8*  PROT 6.7 5.7*  ALBUMIN 2.8* 2.3*   Recent Labs  Lab 07/20/19 1511  LIPASE 143*   No results for input(s): AMMONIA in the last 168 hours. Coagulation Profile: Recent Labs  Lab 07/20/19 1130  INR 1.2   Cardiac Enzymes: No results for input(s): CKTOTAL, CKMB, CKMBINDEX, TROPONINI in the last 168 hours. BNP (last 3 results) No results for input(s): PROBNP in the last 8760 hours. HbA1C: No results for input(s): HGBA1C in the last 72 hours. CBG: No results for input(s): GLUCAP in the last 168 hours. Lipid Profile: No results for input(s): CHOL, HDL, LDLCALC, TRIG, CHOLHDL, LDLDIRECT in the last 72 hours. Thyroid Function Tests: No results for input(s): TSH, T4TOTAL, FREET4, T3FREE, THYROIDAB in the last 72 hours. Anemia Panel: No results for input(s): VITAMINB12, FOLATE, FERRITIN, TIBC, IRON, RETICCTPCT in the last 72 hours. Urine analysis:    Component Value Date/Time   COLORURINE YELLOW 11/22/2015 0905   APPEARANCEUR CLEAR 11/22/2015 0905   LABSPEC 1.017 11/22/2015 0905   PHURINE 6.0 11/22/2015 0905   GLUCOSEU NEGATIVE 11/22/2015 0905   HGBUR MODERATE (A) 11/22/2015 0905   BILIRUBINUR NEGATIVE 11/22/2015 0905   KETONESUR NEGATIVE 11/22/2015 0905   PROTEINUR NEGATIVE 11/22/2015 0905   NITRITE NEGATIVE 11/22/2015 0905   LEUKOCYTESUR SMALL (A) 11/22/2015 0905    Radiological Exams on Admission: [place] CORE BIOPSY (LIVER)  Result Date: 07/20/2019 INDICATION: 74 year old female with a past history of both breast and carotid carcinoma. She now has extensive multifocal liver lesions and presents for ultrasound-guided core biopsy of the same. EXAM: ULTRASOUND BIOPSY CORE LIVER MEDICATIONS: None. ANESTHESIA/SEDATION: Moderate (conscious) sedation  was employed during this procedure. A total of Versed 2 mg and Fentanyl 100 mcg was administered intravenously. Moderate Sedation Time: 10 minutes. The patient's level of consciousness and vital signs were monitored continuously by radiology nursing throughout the procedure under my direct supervision. FLUOROSCOPY TIME:  None COMPLICATIONS: None immediate. PROCEDURE: Informed written consent was obtained from the patient after a thorough discussion of the procedural risks, benefits and alternatives. All questions were addressed. A timeout was performed prior to the initiation of the procedure. The liver was interrogated with ultrasound. There are numerous hypoechoic solid lesions scattered throughout the liver. Additionally, there  is intrahepatic biliary duct dilatation involving both the right and left hepatic lobes. The common bile duct is mildly dilated at 8 mm. The gallbladder is contracted.  No stones are visualized. A suitable lesion was identified in segment 4 B of the right liver. A skin entry site was selected and marked. The overlying skin was sterilely prepped and draped in the standard fashion using chlorhexidine skin prep. Local anesthesia was attained by infiltration with 1% lidocaine. A small dermatotomy was made. Under real-time sonographic guidance, a 17 gauge introducer needle was advanced into the margin of the mass. Multiple 18 gauge core biopsies were then coaxially obtained using the Bio Pince automated biopsy device. Biopsy specimens were placed in formalin and delivered to pathology for further analysis. As the introducer needle was removed, the biopsy tract was embolized with a Gel-Foam slurry. Post biopsy ultrasound imaging demonstrates no evidence of immediate complication. IMPRESSION: Ultrasound-guided core biopsy of hepatic lesion. Incidentally noted, there is significant intra and extrahepatic biliary ductal dilatation which is new/progressed compared to the incomplete CT imaging from  July 05, 2019. PLAN: Laboratory and imaging findings suggesting malignant obstructed jaundice and associated hyperbilirubinemia were discussed with the patient's radiation oncologist, Dr. Isidore Moos. Doctor [place] office is ranging to have the patient direct admitted to the hospital by the hospitalist service. Electronically Signed   By: Jacqulynn Cadet M.D.   On: 07/20/2019 15:24    EKG: Independently reviewed.   Assessment/Plan Active Problems:   Obstructive jaundice    Multiple liver lesions concerning for metastatic process Transaminitis Obstructive jaundice Patient presenting from IR following ultrasound-guided biopsy of liver.  LFTs with significant elevation of AST 1193 and ALT 462 with a total bilirubin of 22.5.  Ultrasound core biopsy notable for numerous hypoechoic and solid lesions scattered throughout the liver with intrahepatic biliary ductal dilation involving both right and left hepatic lobes with common bile duct mildly dilated 8 mm; gallbladder contracted with no stones visualized.  Etiology is highly concerning for metastases in the setting of history of breast cancer and parotid gland cancer --Admit to inpatient --[facility] consulted, discussed with Dr. Fuller Plan who will see the patient in the morning --N.p.o., pending MRCP --Check AFP --Continue to trend LFTs daily --Supportive care, antiemetics --May need ERCP stent placement for resolution of obstructive jaundice versus IR biliary drain placement  Hypotension Hx essential hypertension BP 62/36 on presentation.  Received 2 L NS bolus.  Blood pressure improved to 101/55. --hold home antihypertensives with telmisartan/HCTZ --Continue gentle IV fluid hydration with NS at 75 mL's per hour --Closely monitor blood pressure  Hyponatremia Etiology likely secondary to hypovolemic hyponatremia in the setting of poor oral intake and decreased appetite over the past week/month.  Sodium 129 on admission.  Received 2  L NS bolus. --Check urine sodium, urine osmolality --Gentle IV fluid hydration with NS at 75 mL/h --Repeat electrolytes in a.m.  Hypokalemia Potassium 2.6, will replete with IV replacement --Repeat electrolytes in a.m. to include magnesium  Hyperlipidemia: We will hold home or atorvastatin and Zetia for now  History of breast cancer: Follows with medical oncology, Dr. Burr Medico and radiation oncology Dr. Isidore Moos outpatient. --Continue anastrozole  GERD: Continue Pepcid   DVT prophylaxis: Heparin Code Status: Full code Family Communication: Discussed with patient extensively at bedside Disposition Plan: Anticipate discharge home when ready Consults called: [facility] - Dr. Fuller Plan, Dr. Burr Medico tagged to chart Admission status: Inpatient   Severity of Illness: The appropriate patient status for this patient is INPATIENT. Inpatient status is  judged to be reasonable and necessary in order to provide the required intensity of service to ensure the patient's safety. The patient's presenting symptoms, physical exam findings, and initial radiographic and laboratory data in the context of their chronic comorbidities is felt to place them at high risk for further clinical deterioration. Furthermore, it is not anticipated that the patient will be medically stable for discharge from the hospital within 2 midnights of admission. The following factors support the patient status of inpatient.   " The patient's presenting symptoms include nausea, vomiting, weakness, fatigue, decreased appetite, dizziness " The worrisome physical exam findings include hypertension, jaundice " The initial radiographic and laboratory data are worrisome because of hyponatremia, hypokalemia, elevated total bilirubin, elevated AST, elevated ALT " The chronic co-morbidities include HTN, HLD, history of parotid cancer, history of breast cancer.   * I certify that at the point of admission it is my clinical judgment  that the patient will require inpatient hospital care spanning beyond 2 midnights from the point of admission due to high intensity of service, high risk for further deterioration and high frequency of surveillance required.*    Sumedha Munnerlyn J [place] DO [facility] Available via Epic secure chat 7am-7pm After these hours, please refer to coverage provider listed on [email or web address] 07/20/2019, 4:26 PM

## 2019-07-21 ENCOUNTER — Inpatient Hospital Stay (HOSPITAL_COMMUNITY): Payer: Medicare PPO

## 2019-07-21 DIAGNOSIS — K7689 Other specified diseases of liver: Secondary | ICD-10-CM

## 2019-07-21 DIAGNOSIS — I959 Hypotension, unspecified: Secondary | ICD-10-CM

## 2019-07-21 DIAGNOSIS — K831 Obstruction of bile duct: Secondary | ICD-10-CM

## 2019-07-21 DIAGNOSIS — R17 Unspecified jaundice: Secondary | ICD-10-CM

## 2019-07-21 DIAGNOSIS — R7401 Elevation of levels of liver transaminase levels: Secondary | ICD-10-CM

## 2019-07-21 DIAGNOSIS — C07 Malignant neoplasm of parotid gland: Secondary | ICD-10-CM

## 2019-07-21 LAB — COMPREHENSIVE METABOLIC PANEL
ALT: 320 U/L — ABNORMAL HIGH (ref 0–44)
AST: 929 U/L — ABNORMAL HIGH (ref 15–41)
Albumin: 2.4 g/dL — ABNORMAL LOW (ref 3.5–5.0)
Alkaline Phosphatase: 978 U/L — ABNORMAL HIGH (ref 38–126)
Anion gap: 10 (ref 5–15)
BUN: 37 mg/dL — ABNORMAL HIGH (ref 8–23)
CO2: 23 mmol/L (ref 22–32)
Calcium: 7.3 mg/dL — ABNORMAL LOW (ref 8.9–10.3)
Chloride: 99 mmol/L (ref 98–111)
Creatinine, Ser: 1.35 mg/dL — ABNORMAL HIGH (ref 0.44–1.00)
GFR calc Af Amer: 44 mL/min — ABNORMAL LOW (ref >60)
GFR calc non Af Amer: 38 mL/min — ABNORMAL LOW (ref >60)
Glucose, Bld: 95 mg/dL (ref 70–99)
Potassium: 3 mmol/L — ABNORMAL LOW (ref 3.5–5.1)
Sodium: 132 mmol/L — ABNORMAL LOW (ref 135–145)
Total Bilirubin: 16.9 mg/dL — ABNORMAL HIGH (ref 0.3–1.2)
Total Protein: 5.3 g/dL — ABNORMAL LOW (ref 6.5–8.1)

## 2019-07-21 LAB — PROTIME-INR
INR: 1.4 — ABNORMAL HIGH (ref 0.8–1.2)
Prothrombin Time: 17 seconds — ABNORMAL HIGH (ref 11.4–15.2)

## 2019-07-21 LAB — MAGNESIUM: Magnesium: 2.5 mg/dL — ABNORMAL HIGH (ref 1.7–2.4)

## 2019-07-21 MED ORDER — ALBUMIN HUMAN 5 % IV SOLN
12.5000 g | Freq: Once | INTRAVENOUS | Status: AC
  Administered 2019-07-21: 12.5 g via INTRAVENOUS
  Filled 2019-07-21: qty 250

## 2019-07-21 MED ORDER — PIPERACILLIN-TAZOBACTAM 3.375 G IVPB: 3.3750 g | Freq: Four times a day (QID) | INTRAVENOUS | Status: DC

## 2019-07-21 MED ORDER — POTASSIUM CHLORIDE CRYS ER 20 MEQ PO TBCR
40.0000 meq | EXTENDED_RELEASE_TABLET | ORAL | Status: AC
  Administered 2019-07-21 (×2): 40 meq via ORAL
  Filled 2019-07-21 (×2): qty 2

## 2019-07-21 MED ORDER — PIPERACILLIN-TAZOBACTAM 3.375 G IVPB
3.3750 g | Freq: Three times a day (TID) | INTRAVENOUS | Status: AC
  Administered 2019-07-21 – 2019-07-25 (×13): 3.375 g via INTRAVENOUS
  Filled 2019-07-21 (×12): qty 50

## 2019-07-21 MED ORDER — MORPHINE SULFATE (PF) 2 MG/ML IV SOLN: 1.0000 mg | INTRAVENOUS | Status: DC | PRN

## 2019-07-21 MED ORDER — SODIUM CHLORIDE 0.9 % IV BOLUS
500.0000 mL | Freq: Once | INTRAVENOUS | Status: AC
  Administered 2019-07-21: 500 mL via INTRAVENOUS

## 2019-07-21 MED ORDER — BOOST / RESOURCE BREEZE PO LIQD CUSTOM
1.0000 | Freq: Three times a day (TID) | ORAL | Status: DC
  Administered 2019-07-21 – 2019-07-27 (×11): 1 via ORAL

## 2019-07-21 NOTE — Progress Notes (Signed)
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  74 year old female who presented with hypotension after a ultrasound-guided liver biopsy by IR.  Patient does have the significant past medical history for hypertension, dyslipidemia, breast/parotid cancer and GERD.  Patient reported about 7 days of skin discoloration, associated with nausea, vomiting, decreased appetite and generalized weakness.  July 05, 2019 she was diagnosed with liver lesions per CT scan of the chest which was done prior to follow-up with cancer surveillance.  Patient was referred for IR guided liver biopsy.  Her preoperative studies showed hyponatremia, hypokalemia, hyperbilirubinemia and elevated liver enzymes.  After ultrasound-guided biopsy, her blood pressure was 66/36, with IV fluids improved to 101/55, heart rate 73, respiratory rate 16, temperature 97.9, oxygen saturation 99%, her lungs were clear to auscultation bilaterally, heart S1-S2 present rhythmic, her abdomen was soft and nontender, no lower extremity edema.  MRCP with widespread metastatic disease to the liver.  Dominant index lesion in the right hepatic lobe 4.0 x 3.0 cm.  Intrahepatic biliary dilatation with central truncation of the biliary tree.  Patient has been placed on intravenous fluids and close hemodynamic monitoring.  Plan for ERCP on 07/23/19, pathology still pending.  Assessment & Plan:   Principal Problem:   Obstructive jaundice Active Problems:   Malignant neoplasm of lower-inner quadrant of right breast of female, estrogen receptor positive (HCC)   Cancer of parotid gland (HCC)   Nodule on liver   Hypokalemia   Hyponatremia   Transaminitis   Hypotension   Elevated bilirubin   1. Obstructive jaundice (direct hyperbilirubinemia) with high risk  for cholangitis. Patient has been NPO, no further nausea or vomiting, abdominal pain has improved. Wbc 7.8, AST trending down to 929 from 1.054, ALT 320 from 378, T Bil 16,9 from 18,8. Pathology still pending from liver biopsy.   Will continue hydration with IV fluids, with isotonic saline, will add antibiotic therapy with IV Zosyn and will follow on cell count and temperature curve. Follow up with GI recommendations for ERCP on 05.07.21. Continue antiacids and as needed antiemetics and analgesics. Will give a trial of clears for now. Follow up liver function in am.   2. HTN/ dyslipidemia. Will continue to hold on antihypertensive agents due to risk of hypotension ( at home on telemisartan and hctz). Holding statin therapy for now, until improvement on liver enzymes.  3. AKI with Hyponatremia and hypokalemia. Hypovolemic hyponatremia, Na today 132, K 3,0 and cl at 99 with bicarb 23. Serum cr at 1,35 from 1.38. Documented urine output 650 ml.  Will continue hydration with isotonic saline at 75 ml per H, will correct K with Kcl 80 meq in 2 divided doses and will follow renal panel in am, with Mg.   4. Hx of breast cancer. Continue anastrazole. Follow with oncology recommendations.   5. GERD. Continue famotidine.    Status is: Inpatient  Remains inpatient appropriate because:IV treatments appropriate due to intensity of illness or inability to take PO   Dispo: The patient is from: Home              Anticipated d/c is to: Home              Anticipated d/c date is: > 3 days  Patient currently is not medically stable to d/c.        DVT prophylaxis: Enoxaparin   Code Status:    full  Family Communication:  .I spoke with patient's husband at the bedside, we talked in detail about patient's condition, plan of care and prognosis and all questions were addressed.      Consultants:   GI   Oncology   Procedures:     Antimicrobials:   Zosyn IV      Subjective: Patient with improved abdominal pain, no nausea or vomiting, continue to have icterus. Patient willing to advance her diet./   Objective: Vitals:   07/21/19 0328 07/21/19 0522 07/21/19 0530 07/21/19 0623  BP: (!) 86/51 (!) 89/53 98/70   Pulse: 63 63  63  Resp:    19  Temp:    97.8 F (36.6 C)  TempSrc:    Oral  SpO2:    98%  Weight:      Height:        Intake/Output Summary (Last 24 hours) at 07/21/2019 0940 Last data filed at 07/21/2019 0600 Gross per 24 hour  Intake 3321.39 ml  Output 650 ml  Net 2671.39 ml   Filed Weights   07/20/19 2051  Weight: 77.1 kg    Examination:   General: Not in pain or dyspnea, deconditioned  Neurology: Awake and alert, non focal  E ENT: no pallor, positive icterus, oral mucosa moist Cardiovascular: No JVD. S1-S2 present, rhythmic, no gallops, rubs, or murmurs. No lower extremity edema. Pulmonary: positive breath sounds bilaterally, adequate air movement, no wheezing, rhonchi or rales. Gastrointestinal. Abdomen mild distended but no organomegaly, non tender, no rebound or guarding Skin. No rashes Musculoskeletal: no joint deformities     Data Reviewed: I have personally reviewed following labs and imaging studies  CBC: Recent Labs  Lab 07/20/19 1130 07/20/19 1958  WBC 8.8 7.8  NEUTROABS 7.4  --   HGB 15.1* 13.8  HCT 42.6 39.3  MCV 89.3 89.1  PLT 207 [id number]   Basic Metabolic Panel: Recent Labs  Lab 07/20/19 1130 07/20/19 1958 07/21/19 0437  NA 129*  --  132*  K 2.6*  --  3.0*  CL 87*  --  99  CO2 28  --  23  GLUCOSE 135*  --  95  BUN 35*  --  37*  CREATININE 1.31* 1.38* 1.35*  CALCIUM 8.5*  --  7.3*  MG  --   --  2.5*   GFR: Estimated Creatinine Clearance: 36.9 mL/min (A) (by C-G formula based on SCr of 1.35 mg/dL (H)). Liver Function Tests: Recent Labs  Lab 07/20/19 1130 07/20/19 1511 07/21/19 0437  AST 1,193* 1,026* 929*  ALT 462* 378* 320*  ALKPHOS 1,251* 1,054* 978*  BILITOT 22.5* 18.8*  16.9*  PROT 6.7 5.7* 5.3*  ALBUMIN 2.8* 2.3* 2.4*   Recent Labs  Lab 07/20/19 1511  LIPASE 143*   No results for input(s): AMMONIA in the last 168 hours. Coagulation Profile: Recent Labs  Lab 07/20/19 1130 07/21/19 0437  INR 1.2 1.4*   Cardiac Enzymes: No results for input(s): CKTOTAL, CKMB, CKMBINDEX, TROPONINI in the last 168 hours. BNP (last 3 results) No results for input(s): PROBNP in the last 8760 hours. HbA1C: Recent Labs    07/20/19 1958  HGBA1C 5.9*   CBG: No results for input(s): GLUCAP in the last 168 hours. Lipid Profile: No results for input(s): CHOL, HDL, LDLCALC, TRIG, CHOLHDL, LDLDIRECT in the last 72 hours. Thyroid Function Tests: Recent Labs  07/20/19 1958  TSH 1.232   Anemia Panel: No results for input(s): VITAMINB12, FOLATE, FERRITIN, TIBC, IRON, RETICCTPCT in the last 72 hours.    Radiology Studies: I have reviewed all of the imaging during this hospital visit personally     Scheduled Meds: . anastrozole  1 mg Oral Daily  . diphenhydrAMINE  50 mg Oral Once   Or  . diphenhydrAMINE  50 mg Intravenous Once  . famotidine  20 mg Oral BID  . heparin  5,000 Units Subcutaneous Q8H   Continuous Infusions: . sodium chloride 75 mL/hr at 07/21/19 0522     LOS: 1 day        Janylah Belgrave Gerome Apley, MD

## 2019-07-21 NOTE — Evaluation (Signed)
Occupational Therapy Evaluation Patient Details Name: [name] MRN: [id number] DOB: Nov 25, 1945 Today's Date: 07/21/2019    History of Present Illness 74 y.o. female with medical history significant of essential hypertension, hyperlipidemia, history of breast/parotid cancer, GERD who presented to the ED from interventional radiology for concerns of electrolyte disturbance.   Clinical Impression   Patient is at baseline with functional mobility and self care tasks, no physical assist needed. Patient reports no concerns with managing daily routine at home. Will discontinue acute OT services at this time.    Follow Up Recommendations  No OT follow up    Equipment Recommendations  None recommended by OT       Precautions / Restrictions Precautions Precautions: None Restrictions Weight Bearing Restrictions: No      Mobility Bed Mobility Overal bed mobility: Modified Independent                Transfers Overall transfer level: Independent Equipment used: None                  Balance Overall balance assessment: No apparent balance deficits (not formally assessed)                                         ADL either performed or assessed with clinical judgement   ADL Overall ADL's : Independent;At baseline                                       General ADL Comments: patient ambulating to bathroom to brush her teeth without assist, patient demo toilet transfer and LB dressing without assist                   Pertinent Vitals/Pain Pain Assessment: No/denies pain     Hand Dominance Right   Extremity/Trunk Assessment Upper Extremity Assessment Upper Extremity Assessment: Overall WFL for tasks assessed   Lower Extremity Assessment Lower Extremity Assessment: Overall WFL for tasks assessed   Cervical / Trunk Assessment Cervical / Trunk Assessment: Normal   Communication Communication Communication: No  difficulties   Cognition Arousal/Alertness: Awake/alert Behavior During Therapy: WFL for tasks assessed/performed Overall Cognitive Status: Within Functional Limits for tasks assessed                                                Home Living Family/patient expects to be discharged to:: Private residence Living Arrangements: Spouse/significant other Available Help at Discharge: Family;Available 24 hours/day Type of Home: House Home Access: Stairs to enter [employer] of Steps: 4-5 Entrance Stairs-Rails: Left;Right;Can reach both Home Layout: One level     Bathroom Shower/Tub: Walk-in shower;Tub/shower Editor, commissioning: Standard     Home Equipment: Shower seat - built in          Prior Functioning/Environment Level of Independence: Independent                 OT Problem List: Decreased activity tolerance       AM-PAC OT "6 Clicks" Daily Activity     Outcome Measure Help from another person eating meals?: None Help from another person taking care of personal grooming?: None Help from another person  toileting, which includes using toliet, bedpan, or urinal?: None Help from another person bathing (including washing, rinsing, drying)?: None Help from another person to put on and taking off regular upper body clothing?: None Help from another person to put on and taking off regular lower body clothing?: None 6 Click Score: 24   End of Session    Activity Tolerance: Patient tolerated treatment well Patient left: in bed;with call bell/phone within reach;with family/visitor present  OT Visit Diagnosis: Other abnormalities of gait and mobility (R26.89)                Time: 1030-1045 OT Time Calculation (min): 15 min Charges:  OT General Charges $OT Visit: 1 Visit OT Evaluation $OT Eval Moderate Complexity: 1 Mod  Delbert Phenix OT Pager: [place] 07/21/2019, 12:58 PM

## 2019-07-21 NOTE — Progress Notes (Signed)
Patient blood pressure was 90/48 and MAP was 62. On call Sharlet Salina was notified. No new orders at the moment.

## 2019-07-21 NOTE — Consult Note (Addendum)
Referring Provider: Dr. Roslynn Amble  Primary Care Physician:  [name], MD Primary Gastroenterologist:  Previously Dr. Olevia Perches with [facility]  Reason for Consultation:  Elevated LFTs, liver mass   HPI: [name] is a 74 y.o. 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  S/P right hip replacement surgery 2017. She underwent a surveillance chest CT on 07/05/2019 due to her prior history of parotic cancer which showed multiple liver lesions concerning for metastatic disease. A liver biopsy was ordered by her oncologist Dr. Lanell Persons which was done at [facility] by IR on 07/20/2019. Pre procedure laboratory studies showed hyponatremia, hypokalemia and elevated T. Bili, AST and ALT levels. She became hypotensive following the liver biopsy. BP 62/36. She received 2L NS IV  Bolus.  She was transferred to the ED then admitted to the hospital for evaluation.  She complains of having digestive issues for the past 3 to 4 weeks.  She described feeling as if food is not moving down her digestive track normally. No dysphagia. She has chronic mild heartburn which has  been more noticeable.  She recently started taking Famotidine 20 mg once daily as prescribed by her PCP.  She vomited partially undigested food x 2 on 4/13 and 4/17. No hematemesis. No further vomiting since then. She developed constipation 3 weeks ago and she took 1 suppository which resulted in passing a large amount of yellow solid stool followed by diarrhea for the next few days.  Since that time, she is passing yellow solid stools daily.  No rectal bleeding or melena.  Her urine has been darker yellow in color for the past 4 weeks as well.  Appetite has decreased significantly over the past 2 weeks.  She is repulsed by food.  Coffee no longer is enjoyable.   She has lost 10 pounds in the past 2 weeks.  Fever, sweats or chills.  She drinks 2 glasses of wine weekly.  She takes Advil PM 1 tab every 2 weeks at bedtime.  No other NSAID use. She complains of fatigue. No pruritis or confusion. She underwent a colonoscopy by Dr. Olevia Perches in 2004 and in 2010 which showed diverticulosis, no colon polyps. Her mother died from liver cancer at age of 31.  Further details regarding her mother's history of liver cancer are unknown, mother without  history of alcohol abuse or viral/autoimmune hepatitis.  No family history of esophageal, gastric or colon cancer.  ED Course: Sodium 129.  Potassium 2.6.  Chloride 87.  CO2 28.  Glucose 135.  BUN 35.  Creatinine 1.31.  Calcium 8.5.  Alk phos 1251.  Albumin 2.8.  AST 1193.  ALT 462.  Total bilirubin 22.5. WBC 8.8.  Hemoglobin 15.1.  Hematocrit 42.6.  Platelet 207.  PT 14.3.  INR 1.2.  Lactic acid 1.7.  Influenza A and B negative.  Hepatitis A IgM nonreactive.  Hepatitis B surface antigen nonreactive.  Hepatitis B core IgM antibody nonreactive.  HCV antibody nonreactive.  Lipase 143.  Hemoglobin  A1c 5.9.  TSH 1.232.  Acetaminophen < 10.  SARS coronavirus 2 negative.  AFP pending.  Abdominal MRI/MRCP with contrast 07/19/2019: 1. Widespread metastatic disease throughout the liver. Dominant index lesion is in the right hepatic lobe measuring 4.0 by 3.0 cm. 2. Intrahepatic biliary dilatation with central truncation of the biliary tree. The appearance could be  due to extrinsic effect on the biliary tree or a central primary hepatobiliary malignancy such as cholangiocarcinoma. 3. Questionable adenopathy along the porta hepatis. 4. Apparent fullness in the ascending colon, possibly due to contraction although tumor is difficult to totally exclude given the appearance. 5. Trace right pleural effusion.  Upper abdominal ascites. 6. Despite efforts by the technologist and patient, motion artifact is present on today's exam and could not  be eliminated. This reduces exam sensitivity and specificity.   Chest CT 07/05/2019: 1. No acute cardiopulmonary abnormalities. No mass or adenopathy identified within the chest. 2. Interval development of multiple ill-defined low attenuation liver lesions, worrisome for metastatic disease. 3. Aortic atherosclerosis. Aortic Atherosclerosis   Colonoscopy 04/05/2008 by Dr. Olevia Perches: Mild diverticulosis otherwise normal colonoscopy. No colon polyps. recall colonoscopy 10 years  Colonoscopy 09/21/2002: Normal colonoscopy. No colon polyps.    Past Medical History:  Diagnosis Date  . Arthritis   . Cancer ([place]) 06/2017   right breast cancer  . GERD (gastroesophageal reflux disease)    mild- TUMS used sparingly  . History of radiation therapy 08/05/18- 09/22/18   Left Parotid 33 fractions for a total of 66 Gy.   Marland Kitchen History of radiation therapy 09/01/2017- 09/29/2017   Right Breast + seroma boost/ total of 50.06 Gy   . Hyperlipidemia   . Hypertension   . PONV (postoperative nausea and vomiting)    also "passed out with cyst removal from neck"superficial"  . Syncope    x2 episodes-evaluated and findings normal- "usually related to seeing blood or thought of it"  . Vaso vagal episode    easily  . Vertigo    She reports several episodes of vertigo. She tells me something black came out of her ear, and she has not had an episode since then.     Past Surgical History:  Procedure Laterality Date  . APPENDECTOMY    . BREAST LUMPECTOMY WITH RADIOACTIVE SEED AND SENTINEL LYMPH NODE BIOPSY Right 07/30/2017   Procedure: BREAST LUMPECTOMY WITH RADIOACTIVE SEED AND SENTINEL LYMPH NODE BIOPSY;  Surgeon: [name], MD;  Location: [facility];  Service: General;  Laterality: Right;  . BREAST SURGERY  08/27/11   right lumpectomy w/needle localization  . BUNIONECTOMY Bilateral 2000   bilateral -hardware remains  . CYST REMOVAL NECK     sebaceous  cyst from neck  . DILATION AND  CURETTAGE OF UTERUS    . INTRAUTERINE DEVICE INSERTION    . IUD REMOVAL    . LAPAROSCOPIC APPENDECTOMY N/A 03/27/2017   Procedure: APPENDECTOMY LAPAROSCOPIC;  Surgeon: Alphonsa Overall, MD;  Location: [facility];  Service: General;  Laterality: N/A;  . PAROTIDECTOMY Left 11/28/2017   SUPERFICIAL  . PAROTIDECTOMY Left 11/28/2017   Procedure: LEFT SUPEFICIAL PAROTIDECTOMY WITH OBSERVATION;  Surgeon: Jerrell Belfast, MD;  Location: [facility];  Service: ENT;  Laterality: Left;  . PAROTIDECTOMY Left 06/29/2018   Dr. Nicolette Bang at [facility] Deep parotidectomy  . Removal Right Straphenous vein  2011  . TONSILLECTOMY     child  . TOTAL HIP ARTHROPLASTY Right 11/29/2015   Procedure: RIGHT TOTAL HIP ARTHROPLASTY ANTERIOR APPROACH;  Surgeon: Gaynelle Arabian, MD;  Location: [facility];  Service: Orthopedics;  Laterality: Right;    Prior to Admission medications   Medication Sig Start Date End Date Taking? Authorizing Provider  anastrozole (ARIMIDEX) 1 MG tablet TAKE 1 TABLET(1 MG) BY MOUTH DAILY Patient taking differently: Take 1 mg by mouth daily. TAKE 1 TABLET(1 MG) BY MOUTH DAILY 10/09/18  Yes Truitt Merle, MD  atorvastatin (LIPITOR) 80 MG tablet Take 80 mg by mouth every evening.   Yes [provider]  Calcium-Phosphorus-Vitamin D (CITRACAL +D3 PO) Take 1 tablet by mouth daily.   Yes [provider]  Cholecalciferol (VITAMIN D3) 2000 units TABS Take 2,000 Units by mouth daily.   Yes [provider]  famotidine (PEPCID) 20 MG tablet Take 20 mg by mouth 2 (two) times daily. Morning and bedtime   Yes [provider]  FINACEA 15 % cream Apply 1 application topically daily as needed (rosacea). Applied to nose. 09/13/15  Yes [provider]  LUTEIN-ZEAXANTHIN PO Take 1 tablet by mouth daily.   Yes [provider]  MICARDIS HCT 40-12.5 MG per tablet Take 1 tablet by mouth daily.  06/26/11  Yes [provider]  ZETIA 10 MG tablet Take 10 mg by mouth  every evening.  09/16/11  Yes [provider]  diphenhydrAMINE (BENADRYL) 50 MG tablet Take 1 tablet at 1 hour before CT scans. Patient not taking: Reported on 07/06/2019 06/04/19   Eppie Gibson, MD  predniSONE (DELTASONE) 50 MG tablet Take 1 tablet at 13 hours, 7 hours, and 1 hour before CT scans. Patient not taking: Reported on 07/20/2019 06/04/19   Eppie Gibson, MD    Current Facility-Administered Medications  Medication Dose Route Frequency Provider Last Rate Last Admin  . 0.9 %  sodium chloride infusion   Intravenous Continuous [place] Eric J, DO 75 mL/hr at 07/21/19 0522 New Bag at 07/21/19 0522  . anastrozole (ARIMIDEX) tablet 1 mg  1 mg Oral Daily [place] Eric J, DO      . diphenhydrAMINE (BENADRYL) capsule 50 mg  50 mg Oral Once Lucrezia Starch, MD       Or  . diphenhydrAMINE (BENADRYL) injection 50 mg  50 mg Intravenous Once Lucrezia Starch, MD      . famotidine (PEPCID) tablet 20 mg  20 mg Oral BID [place] Eric J, DO   20 mg at 07/20/19 2046  . heparin injection 5,000 Units  5,000 Units Subcutaneous Q8H [place] Eric J, DO   5,000 Units at 07/21/19 0960  . ondansetron (ZOFRAN) tablet 4 mg  4 mg Oral Q6H PRN [place] Donnamarie Poag, DO       Or  . ondansetron [facility]) injection 4 mg  4 mg Intravenous Q6H PRN [place] Eric J, DO      . oxyCODONE (Oxy IR/ROXICODONE) immediate release tablet 5 mg  5 mg Oral Q4H PRN [place] Eric J, DO      . senna-docusate (Senokot-S) tablet 1 tablet  1 tablet Oral QHS PRN [place] Donnamarie Poag, DO        Allergies as of 07/20/2019 - Review Complete 07/20/2019  Allergen Reaction Noted  . Dermacinrx surgical pharmapak Dermatitis 08/27/2017  . Iohexol Hives 05/25/2018    Family History  Problem Relation Age of Onset  . Cancer Mother        benign brain tumor   . Cancer Paternal Aunt        unknown type     Social History   Socioeconomic History  . Marital status: Married    Spouse name: Not on file  . Number of children: Not on file  .  Years of education: Not on file  . Highest education level: Not on file  Occupational History  . Not on file  Tobacco Use  . Smoking status: Former Smoker    Packs/day: 0.50  Years: 10.00    Pack years: 5.00    Quit date: 11/21/1985    Years since quitting: 33.6  . Smokeless tobacco: Never Used  Substance and Sexual Activity  . Alcohol use: Yes    Comment: Socially  . Drug use: No  . Sexual activity: Yes  Other Topics Concern  . Not on file  Social History Narrative  . Not on file   Social Determinants of Health   Financial Resource Strain:   . Difficulty of Paying Living Expenses:   Food Insecurity:   . Worried About Charity fundraiser in the Last Year:   . Arboriculturist in the Last Year:   Transportation Needs: No Transportation Needs  . Lack of Transportation (Medical): No  . Lack of Transportation (Non-Medical): No  Physical Activity:   . Days of Exercise per Week:   . Minutes of Exercise per Session:   Stress:   . Feeling of Stress :   Social Connections:   . Frequency of Communication with Friends and Family:   . Frequency of Social Gatherings with Friends and Family:   . Attends Religious Services:   . Active Member of Clubs or Organizations:   . Attends Archivist Meetings:   Marland Kitchen Marital Status:   Intimate Partner Violence: Not At Risk  . Fear of Current or Ex-Partner: No  . Emotionally Abused: No  . Physically Abused: No  . Sexually Abused: No    Review of Systems: Gen: See HPI. CV: Denies chest pain, palpitations or edema. Resp: Denies cough, shortness of breath of hemoptysis.  GI: See HPI. GU : Urine is darker in color.  Denies urinary burning, blood in urine, increased urinary frequency or incontinence. MS: Denies joint pain, muscles aches or weakness. Derm: Denies rash, itchiness, skin lesions or unhealing ulcers. Psych: Denies depression, anxiety, memory loss, suicidal ideation or confusion. Heme: Denies easy bruising,  bleeding. Neuro:  Denies headaches, dizziness or paresthesias. Endo:  Denies any problems with DM, thyroid or adrenal function.  Physical Exam: Vital signs in last 24 hours: Temp:  [97.7 F (36.5 C)-98.1 F (36.7 C)] 98 F (36.7 C) (05/05 0130) Pulse Rate:  [63-85] 63 (05/05 0522) Resp:  [14-18] 18 (05/05 0130) BP: (62-114)/(36-70) 98/70 (05/05 0530) SpO2:  [95 %-100 %] 95 % (05/05 0130) Weight:  [77.1 kg] 77.1 kg (05/04 2051) Last BM Date: 07/20/19 General:  Alert, well-developed, well-nourished, pleasant and cooperative in NAD. Head:  Normocephalic and atraumatic. Eyes: Moderate scleral icterus present.  Conjunctiva pink. Ears:  Normal auditory acuity. Nose:  No deformity, discharge or lesions. Mouth:  Dentition intact. No ulcers or lesions.  Neck:  Supple. No lymphadenopathy or thyromegaly.  Lungs: Sounds clear throughout. Heart: Regular rate and rhythm, no murmurs. Abdomen: Soft, nondistended.  Nontender.  No obvious HSM.  Positive bowel sounds all 4 quadrants.  No palpable mass. Rectal: Deferred. Musculoskeletal:  Symmetrical without gross deformities.  Pulses:  Normal pulses noted. Extremities:  Without clubbing or edema. Neurologic:  Alert and  oriented x4. No focal deficits.  Skin: Jaundiced.  Intact without significant lesions or rashes. Psych:  Alert and cooperative. Normal mood and affect.  Intake/Output from previous day: 05/04 0701 - 05/05 0700 In: 2630.6 [P.O.:120; I.V.:1234.2; IV Piggyback:1276.4] Out: 650 [Urine:650] Intake/Output this shift: Total I/O In: 630.6 [P.O.:120; I.V.:234.2; IV Piggyback:276.4] Out: 650 [Urine:650]  Lab Results: Recent Labs    07/20/19 1130 07/20/19 1958  WBC 8.8 7.8  HGB 15.1* 13.8  HCT 42.6  39.3  PLT 207 185   BMET Recent Labs    07/20/19 1130 07/20/19 1958 07/21/19 0437  NA 129*  --  132*  K 2.6*  --  3.0*  CL 87*  --  99  CO2 28  --  23  GLUCOSE 135*  --  95  BUN 35*  --  37*  CREATININE 1.31* 1.38* 1.35*   CALCIUM 8.5*  --  7.3*   LFT Recent Labs    07/20/19 1511 07/20/19 1511 07/21/19 0437  PROT 5.7*   < > 5.3*  ALBUMIN 2.3*   < > 2.4*  AST 1,026*   < > 929*  ALT 378*   < > 320*  ALKPHOS 1,054*   < > 978*  BILITOT 18.8*   < > 16.9*  BILIDIR 12.3*  --   --   IBILI 6.5*  --   --    < > = values in this interval not displayed.   PT/INR Recent Labs    07/20/19 1130 07/21/19 0437  LABPROT 14.3 17.0*  INR 1.2 1.4*   Hepatitis Panel Recent Labs    07/20/19 1511  HEPBSAG NON REACTIVE  HCVAB NON REACTIVE  HEPAIGM NON REACTIVE  HEPBIGM NON REACTIVE      Studies/Results: [place] CORE BIOPSY (LIVER)  Result Date: 07/20/2019 INDICATION: 74 year old female with a past history of both breast and carotid carcinoma. She now has extensive multifocal liver lesions and presents for ultrasound-guided core biopsy of the same. EXAM: ULTRASOUND BIOPSY CORE LIVER MEDICATIONS: None. ANESTHESIA/SEDATION: Moderate (conscious) sedation was employed during this procedure. A total of Versed 2 mg and Fentanyl 100 mcg was administered intravenously. Moderate Sedation Time: 10 minutes. The patient's level of consciousness and vital signs were monitored continuously by radiology nursing throughout the procedure under my direct supervision. FLUOROSCOPY TIME:  None COMPLICATIONS: None immediate. PROCEDURE: Informed written consent was obtained from the patient after a thorough discussion of the procedural risks, benefits and alternatives. All questions were addressed. A timeout was performed prior to the initiation of the procedure. The liver was interrogated with ultrasound. There are numerous hypoechoic solid lesions scattered throughout the liver. Additionally, there is intrahepatic biliary duct dilatation involving both the right and left hepatic lobes. The common bile duct is mildly dilated at 8 mm. The gallbladder is contracted.  No stones are visualized. A suitable lesion was identified in segment 4 B of the  right liver. A skin entry site was selected and marked. The overlying skin was sterilely prepped and draped in the standard fashion using chlorhexidine skin prep. Local anesthesia was attained by infiltration with 1% lidocaine. A small dermatotomy was made. Under real-time sonographic guidance, a 17 gauge introducer needle was advanced into the margin of the mass. Multiple 18 gauge core biopsies were then coaxially obtained using the Bio Pince automated biopsy device. Biopsy specimens were placed in formalin and delivered to pathology for further analysis. As the introducer needle was removed, the biopsy tract was embolized with a Gel-Foam slurry. Post biopsy ultrasound imaging demonstrates no evidence of immediate complication. IMPRESSION: Ultrasound-guided core biopsy of hepatic lesion. Incidentally noted, there is significant intra and extrahepatic biliary ductal dilatation which is new/progressed compared to the incomplete CT imaging from July 05, 2019. PLAN: Laboratory and imaging findings suggesting malignant obstructed jaundice and associated hyperbilirubinemia were discussed with the patient's radiation oncologist, Dr. Isidore Moos. Doctor Edna office is ranging to have the patient direct admitted to the hospital by the hospitalist service. Electronically Signed   By:  Jacqulynn Cadet M.D.   On: 07/20/2019 15:24    IMPRESSION/PLAN:  8.  74 year old female with a complex medical history including breast cancer 2018 and parotid cancer 2019 with recurrence in 2020 found to have new liver lesions on surveillance chest CT 4/19 with obstructive jaundice. She underwent a sono guided core liver biopsy by IR on 07/20/2019, results pending.  She became hypotensive following the liver biopsy. Laboratory studies were consistent with acute cholestasis therefore she was admitted to the hospital for further evaluation.  An abdominal MRI with MRCP 5/4  identified widespread metastatic disease throughout the liver.  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  Diffuse gallbladder wall thickening. Upper abdominal ascites.  Fullness to the ascending colon was noted, a tumor could not be excluded. Alk phos 1054 -> 978. T. Bili 18.8 -> 16.9. AST 1026 -> 929. ALT 378 -> 320. AFP pending. INR 1.3 -> 1.4. WBC 7.8. She is afebrile. She is hemodynamically stable.  -ERCP to be completed preferably after liver biopsy results received as patient may need further tissue sampling/biliary brushing. ERCP most likely on Friday 5/7.  I will request rush pathology report.  ERCP benefits and risks discussed with the patient occluding risks with general anesthesia, risk of bleeding, perforation, infection and pancreatitis. -NPO -IV fluid per  the hospitalist. -Daily hepatic panel, BMP and PT/INR. IgG, ANA, SMA, AMA. -? Zosyn IV as patient is at risk for ascending cholangitis  -Abdominal sonogram to assess if enough ascites to tap -Ondansetron 36m IV PRN Q 6 hours N/V  2. GERD -Continue Famotidine 272mdaily   3. CTAP showed fullness to the ascending colon. Last colonoscopy in 2010 showed diverticulosis. No polyps.  -Eventual colonoscopy to be considered  4. Coagulopathy. INR 1.3 -> 1.4. secondary to # 1 -Repeat PT/INR in am  Further recommendations per Dr. BeTarri GlennADDENDUM 07/21/2019: I spoke with pathologist Dr. KiLyndon Codeinitial liver biopsy pathology consistent with malignancy, primary breast ca. Await final pathology report.  Patient has been scheduled for ERCP with Dr. MaRush Landmarkn Friday 07/23/2019.    CoPatrecia Pourennedy-Smith  07/21/2019, 9:03am

## 2019-07-21 NOTE — Progress Notes (Signed)
Initial Nutrition Assessment  DOCUMENTATION CODES:   Not applicable  INTERVENTION:  Advance diet as medically feasible Boost Breeze po TID, each supplement provides 250 kcal and 9 grams of protein  NUTRITION DIAGNOSIS:   Inadequate oral intake related to nausea, vomiting, decreased appetite as evidenced by per patient/family report(progressive over the past month).  GOAL:   Patient will meet greater than or equal to 90% of their needs    MONITOR:   Labs, I & O's, Skin, Supplement acceptance, Diet advancement, Weight trends, PO intake  REASON FOR ASSESSMENT:   Malnutrition Screening Tool    ASSESSMENT:   74 year old female with past medical history of HTN, HLD, history of breast/parotid cancer, GERD, admitted for obstructive juandice after presenting from IR (where she was scheduled for guided biopsy of liver lesion found on 4/19 CT chest during parotid cancer screening) with concerns of electrolyte disturbance and reports skin discoloration onset 7 days ago associated with some N/V, decreased appetite, dizziness, weakness, and fatigue that has been progressing over the last month.  Patient presented with hypotension after completion of US guided liver biopsy, labs showed hyponatremia, hypokalemia, hyperbilirubinemia and elevated liver enzymes. Patient is s/p abdominal MRI/MRCP on 5/03 that revealed widespread metastatic disease throughout the liver with central mass causing right and left sided biliary obstruction. Pathology results from biopsy pending. Plans for ERCP for biliary stent placement tentatively scheduled for 07/23/19.  Patient diet advanced to CLD at 50 today, will provide Boost Breeze supplement to aid with needs and continue to follow for diet advancement.   Current wt 169.92 lbs Per history, on 12/18/18 pt weighed 181.72 lbs, on 07/06/19 pt weighed 172.7 lbs indicating a 12 lb (6.7%) wt loss over the past 7 months and 3 lb (1.8%) wt loss in 2 weeks which is  insignificant, but concerning given metastatic disease revealed on recent MRI/MRCP.   Medications reviewed and include: Pepcid IVF: NaCl IVPB: Zosyn Labs: Na 132 (L), K 3 (L), Mg 2.5 (H)  NUTRITION - FOCUSED PHYSICAL EXAM: Deferred   Diet Order:   Diet Order            Diet NPO time specified  Diet effective midnight        Diet clear liquid Room service appropriate? Yes; Fluid consistency: Thin  Diet effective now              EDUCATION NEEDS:   No education needs have been identified at this time  Skin:  Skin Assessment: Reviewed RN Assessment(juandice)  Last BM:  5/5  Height:   Ht Readings from Last 1 Encounters:  07/20/19 5\' 5"  (1.651 m)    Weight:   Wt Readings from Last 1 Encounters:  07/20/19 77.1 kg    BMI:  Body mass index is 28.29 kg/m.  Estimated Nutritional Needs:   Kcal:  2000-2200  Protein:  100-110  Fluid:  > 1.9 L/day   Lajuan Lines, RD, LDN Clinical Nutrition After Hours/Weekend Pager # in [place]

## 2019-07-21 NOTE — Progress Notes (Signed)
PT Cancellation Note  Patient Details Name: [name] MRN: [id number] DOB: 06-03-1945   Cancelled Treatment:    Reason Eval/Treat Not Completed: PT screened, no needs identified, will sign off  OT reports pt up in room ad lib and does not appear appropriate for PT evaluation.  No PT needs identified.  PT to sign off.   Jerusalem Brownstein,KATHrine E 07/21/2019, 12:47 PM Jannette Spanner PT, DPT Acute Rehabilitation Services Office: [phone]

## 2019-07-21 NOTE — Progress Notes (Signed)
[name]   DOB:1945-09-29   [id number]   [id number]  Oncology follow up   Subjective: Patient is well-known to me, under my care for her history of breast cancer.  She is on anastrozole.  She also has a history of parotid gland adenoma.  She presents with worsening jaundice, fatigue, nausea and anorexia for the past 3 to 4 weeks.  Her work-up showed diffuse liver metastasis, and the liver biopsy was done yesterday.  GI is on board.   Objective:  Vitals:   07/21/19 1346 07/21/19 2132  BP: 123/60 (!) 102/59  Pulse: 70 70  Resp: 18 16  Temp: 98.5 F (36.9 C) 98.2 F (36.8 C)  SpO2: 100% 98%    Body mass index is 28.29 kg/m.  Intake/Output Summary (Last 24 hours) at 07/21/2019 2318 Last data filed at 07/21/2019 1813 Gross per 24 hour  Intake 2513.17 ml  Output 700 ml  Net 1813.17 ml     Sclerae icteric  Oropharynx clear  No peripheral adenopathy  Lungs clear -- no rales or rhonchi  Heart regular rate and rhythm  Abdomen soft nontender   MSK no focal spinal tenderness, no peripheral edema  Neuro nonfocal    CBG (last 3)  No results for input(s): GLUCAP in the last 72 hours.   Labs:  Urine Studies No results for input(s): UHGB, CRYS in the last 72 hours.  Invalid input(s): UACOL, UAPR, USPG, UPH, UTP, UGL, UKET, UBIL, UNIT, UROB, ULEU, UEPI, UWBC, URBC, UBAC, CAST, UCOM, [place]  Basic Metabolic Panel: Recent Labs  Lab 07/20/19 1130 07/20/19 1958 07/21/19 0437  NA 129*  --  132*  K 2.6*  --  3.0*  CL 87*  --  99  CO2 28  --  23  GLUCOSE 135*  --  95  BUN 35*  --  37*  CREATININE 1.31* 1.38* 1.35*  CALCIUM 8.5*  --  7.3*  MG  --   --  2.5*   GFR Estimated Creatinine Clearance: 36.9 mL/min (A) (by C-G formula based on SCr of 1.35 mg/dL (H)). Liver Function Tests: Recent Labs  Lab 07/20/19 1130 07/20/19 1511 07/21/19 0437  AST 1,193* 1,026* 929*  ALT 462* 378* 320*  ALKPHOS 1,251* 1,054* 978*  BILITOT 22.5* 18.8* 16.9*  PROT 6.7 5.7* 5.3*   ALBUMIN 2.8* 2.3* 2.4*   Recent Labs  Lab 07/20/19 1511  LIPASE 143*   No results for input(s): AMMONIA in the last 168 hours. Coagulation profile Recent Labs  Lab 07/20/19 1130 07/21/19 0437  INR 1.2 1.4*    CBC: Recent Labs  Lab 07/20/19 1130 07/20/19 1958  WBC 8.8 7.8  NEUTROABS 7.4  --   HGB 15.1* 13.8  HCT 42.6 39.3  MCV 89.3 89.1  PLT 207 185   Cardiac Enzymes: No results for input(s): CKTOTAL, CKMB, CKMBINDEX, TROPONINI in the last 168 hours. BNP: Invalid input(s): POCBNP CBG: No results for input(s): GLUCAP in the last 168 hours. D-Dimer No results for input(s): DDIMER in the last 72 hours. Hgb A1c Recent Labs    07/20/19 1958  HGBA1C 5.9*   Lipid Profile No results for input(s): CHOL, HDL, LDLCALC, TRIG, CHOLHDL, LDLDIRECT in the last 72 hours. Thyroid function studies Recent Labs    07/20/19 1958  TSH 1.232   Anemia work up No results for input(s): VITAMINB12, FOLATE, FERRITIN, TIBC, IRON, RETICCTPCT in the last 72 hours. Microbiology Recent Results (from the past 240 hour(s))  Respiratory Panel by RT PCR (Flu A&B, Covid) -  Nasopharyngeal Swab     Status: None   Collection Time: 07/20/19  3:21 PM   Specimen: Nasopharyngeal Swab  Result Value Ref Range Status   SARS Coronavirus 2 by RT PCR NEGATIVE NEGATIVE Final    Comment: (NOTE) SARS-CoV-2 target nucleic acids are NOT DETECTED. The SARS-CoV-2 RNA is generally detectable in upper respiratoy specimens during the acute phase of infection. The lowest concentration of SARS-CoV-2 viral copies this assay can detect is 131 copies/mL. A negative result does not preclude SARS-Cov-2 infection and should not be used as the sole basis for treatment or other patient management decisions. A negative result may occur with  improper specimen collection/handling, submission of specimen other than nasopharyngeal swab, presence of viral mutation(s) within the areas targeted by this assay, and inadequate  number of viral copies (<131 copies/mL). A negative result must be combined with clinical observations, patient history, and epidemiological information. The expected result is Negative. Fact Sheet for Patients:  PinkCheek.be Fact Sheet for Healthcare Providers:  GravelBags.it This test is not yet ap proved or cleared by the [place] FDA and  has been authorized for detection and/or diagnosis of SARS-CoV-2 by FDA under an Emergency Use Authorization (EUA). This EUA will remain  in effect (meaning this test can be used) for the duration of the COVID-19 declaration under Section 564(b)(1) of the Act, 21 U.S.C. section 360bbb-3(b)(1), unless the authorization is terminated or revoked sooner.    Influenza A by PCR NEGATIVE NEGATIVE Final   Influenza B by PCR NEGATIVE NEGATIVE Final    Comment: (NOTE) The Xpert Xpress SARS-CoV-2/FLU/RSV assay is intended as an aid in  the diagnosis of influenza from Nasopharyngeal swab specimens and  should not be used as a sole basis for treatment. Nasal washings and  aspirates are unacceptable for Xpert Xpress SARS-CoV-2/FLU/RSV  testing. Fact Sheet for Patients: PinkCheek.be Fact Sheet for Healthcare Providers: GravelBags.it This test is not yet approved or cleared by the [place] FDA and  has been authorized for detection and/or diagnosis of SARS-CoV-2 by  FDA under an Emergency Use Authorization (EUA). This EUA will remain  in effect (meaning this test can be used) for the duration of the  Covid-19 declaration under Section 564(b)(1) of the Act, 21  U.S.C. section 360bbb-3(b)(1), unless the authorization is  terminated or revoked. Performed at [facility], [place] [address] [place], [place] [address]       Studies:  MR 3D Recon At Scanner  Result Date: 07/21/2019 CLINICAL DATA:  Jaundice, biliary  obstruction. Liver lesion biopsy. History of breast and parotid cancer. EXAM: MRI ABDOMEN WITHOUT AND WITH CONTRAST (INCLUDING MRCP) TECHNIQUE: Multiplanar multisequence MR imaging of the abdomen was performed both before and after the administration of intravenous contrast. Heavily T2-weighted images of the biliary and pancreatic ducts were obtained, and three-dimensional MRCP images were rendered by post processing. CONTRAST:  7.81mL GADAVIST GADOBUTROL 1 MMOL/ML IV SOLN COMPARISON:  CT chest 07/05/2019. CT abdomen  03/27/2017 FINDINGS: Despite efforts by the technologist and patient, motion artifact is present on today's exam and could not be eliminated. This reduces exam sensitivity and specificity. Lower chest: Trace right pleural effusion. Hepatobiliary: Widespread metastatic lesions throughout the liver with associated restriction of diffusion. An index T1 hypointense lesion centrally in the right hepatic lobe measures 4.0 by 3.0 cm on image 30/8. All segments of the liver have metastatic lesions. Diffuse gallbladder wall thickening. Intrahepatic biliary dilatation is observed with central truncation of the biliary tree. Given the bulk of  tumor concentrated in this vicinity, the likelihood of central obstructing mass as a cause is high. The appearance could be from confluent metastatic lesions exerting extrinsic effect on the biliary tree, or a central primary hepatobiliary malignancy such as cholangiocarcinoma. The extrahepatic biliary tree distal to the central obstruction is not dilated. Portal vein remains patent. Pancreas:  Unremarkable Spleen:  Unremarkable Adrenals/Urinary Tract: Small nonenhancing right renal lesions favor cysts. Adrenal glands normal. Stomach/Bowel: On coronal images there is a sense of fullness in the ascending colon, possibly due to contraction although tumor is difficult to totally exclude given the appearance. Vascular/Lymphatic: Questionable adenopathy along the porta hepatis.  Aortoiliac atherosclerotic vascular disease. No portal vein thrombosis identified. Other:  Upper abdominal ascites. Musculoskeletal: Unremarkable IMPRESSION: 1. Widespread metastatic disease throughout the liver. Dominant index lesion is in the right hepatic lobe measuring 4.0 by 3.0 cm. 2. Intrahepatic biliary dilatation with central truncation of the biliary tree. The appearance could be due to extrinsic effect on the biliary tree or a central primary hepatobiliary malignancy such as cholangiocarcinoma. 3. Questionable adenopathy along the porta hepatis. 4. Apparent fullness in the ascending colon, possibly due to contraction although tumor is difficult to totally exclude given the appearance. 5. Trace right pleural effusion.  Upper abdominal ascites. 6. Despite efforts by the technologist and patient, motion artifact is present on today's exam and could not be eliminated. This reduces exam sensitivity and specificity. Electronically Signed   By: Van Clines M.D.   On: 07/21/2019 07:28   MR ABDOMEN WITH MRCP W CONTRAST  Result Date: 07/21/2019 CLINICAL DATA:  Jaundice, biliary obstruction. Liver lesion biopsy. History of breast and parotid cancer. EXAM: MRI ABDOMEN WITHOUT AND WITH CONTRAST (INCLUDING MRCP) TECHNIQUE: Multiplanar multisequence MR imaging of the abdomen was performed both before and after the administration of intravenous contrast. Heavily T2-weighted images of the biliary and pancreatic ducts were obtained, and three-dimensional MRCP images were rendered by post processing. CONTRAST:  7.36mL GADAVIST GADOBUTROL 1 MMOL/ML IV SOLN COMPARISON:  CT chest 07/05/2019. CT abdomen  03/27/2017 FINDINGS: Despite efforts by the technologist and patient, motion artifact is present on today's exam and could not be eliminated. This reduces exam sensitivity and specificity. Lower chest: Trace right pleural effusion. Hepatobiliary: Widespread metastatic lesions throughout the liver with associated  restriction of diffusion. An index T1 hypointense lesion centrally in the right hepatic lobe measures 4.0 by 3.0 cm on image 30/8. All segments of the liver have metastatic lesions. Diffuse gallbladder wall thickening. Intrahepatic biliary dilatation is observed with central truncation of the biliary tree. Given the bulk of tumor concentrated in this vicinity, the likelihood of central obstructing mass as a cause is high. The appearance could be from confluent metastatic lesions exerting extrinsic effect on the biliary tree, or a central primary hepatobiliary malignancy such as cholangiocarcinoma. The extrahepatic biliary tree distal to the central obstruction is not dilated. Portal vein remains patent. Pancreas:  Unremarkable Spleen:  Unremarkable Adrenals/Urinary Tract: Small nonenhancing right renal lesions favor cysts. Adrenal glands normal. Stomach/Bowel: On coronal images there is a sense of fullness in the ascending colon, possibly due to contraction although tumor is difficult to totally exclude given the appearance. Vascular/Lymphatic: Questionable adenopathy along the porta hepatis. Aortoiliac atherosclerotic vascular disease. No portal vein thrombosis identified. Other:  Upper abdominal ascites. Musculoskeletal: Unremarkable IMPRESSION: 1. Widespread metastatic disease throughout the liver. Dominant index lesion is in the right hepatic lobe measuring 4.0 by 3.0 cm. 2. Intrahepatic biliary dilatation with central truncation of the biliary tree.  The appearance could be due to extrinsic effect on the biliary tree or a central primary hepatobiliary malignancy such as cholangiocarcinoma. 3. Questionable adenopathy along the porta hepatis. 4. Apparent fullness in the ascending colon, possibly due to contraction although tumor is difficult to totally exclude given the appearance. 5. Trace right pleural effusion.  Upper abdominal ascites. 6. Despite efforts by the technologist and patient, motion artifact is  present on today's exam and could not be eliminated. This reduces exam sensitivity and specificity. Electronically Signed   By: Van Clines M.D.   On: 07/21/2019 07:28   [place] CORE BIOPSY (LIVER)  Result Date: 07/20/2019 INDICATION: 74 year old female with a past history of both breast and carotid carcinoma. She now has extensive multifocal liver lesions and presents for ultrasound-guided core biopsy of the same. EXAM: ULTRASOUND BIOPSY CORE LIVER MEDICATIONS: None. ANESTHESIA/SEDATION: Moderate (conscious) sedation was employed during this procedure. A total of Versed 2 mg and Fentanyl 100 mcg was administered intravenously. Moderate Sedation Time: 10 minutes. The patient's level of consciousness and vital signs were monitored continuously by radiology nursing throughout the procedure under my direct supervision. FLUOROSCOPY TIME:  None COMPLICATIONS: None immediate. PROCEDURE: Informed written consent was obtained from the patient after a thorough discussion of the procedural risks, benefits and alternatives. All questions were addressed. A timeout was performed prior to the initiation of the procedure. The liver was interrogated with ultrasound. There are numerous hypoechoic solid lesions scattered throughout the liver. Additionally, there is intrahepatic biliary duct dilatation involving both the right and left hepatic lobes. The common bile duct is mildly dilated at 8 mm. The gallbladder is contracted.  No stones are visualized. A suitable lesion was identified in segment 4 B of the right liver. A skin entry site was selected and marked. The overlying skin was sterilely prepped and draped in the standard fashion using chlorhexidine skin prep. Local anesthesia was attained by infiltration with 1% lidocaine. A small dermatotomy was made. Under real-time sonographic guidance, a 17 gauge introducer needle was advanced into the margin of the mass. Multiple 18 gauge core biopsies were then coaxially obtained  using the Bio Pince automated biopsy device. Biopsy specimens were placed in formalin and delivered to pathology for further analysis. As the introducer needle was removed, the biopsy tract was embolized with a Gel-Foam slurry. Post biopsy ultrasound imaging demonstrates no evidence of immediate complication. IMPRESSION: Ultrasound-guided core biopsy of hepatic lesion. Incidentally noted, there is significant intra and extrahepatic biliary ductal dilatation which is new/progressed compared to the incomplete CT imaging from July 05, 2019. PLAN: Laboratory and imaging findings suggesting malignant obstructed jaundice and associated hyperbilirubinemia were discussed with the patient's radiation oncologist, Dr. Isidore Moos. [facility] [place] office is ranging to have the patient direct admitted to the hospital by the hospitalist service. Electronically Signed   By: Jacqulynn Cadet M.D.   On: 07/20/2019 15:24   US Abdomen Limited RUQ  Result Date: 07/21/2019 CLINICAL DATA:  Ascites EXAM: LIMITED ABDOMEN ULTRASOUND FOR ASCITES TECHNIQUE: Limited ultrasound survey for ascites was performed in all four abdominal quadrants. COMPARISON:  MRI 07/20/2019 FINDINGS: Trace abdominopelvic ascites with the largest fluid pocket noted in the pelvis near midline. Heterogeneous appearance of the visualized portion of the liver, better characterized on recent MRI. IMPRESSION: Trace abdominopelvic ascites. Electronically Signed   By: Davina Poke D.O.   On: 07/21/2019 12:31    Assessment: 74 y.o. Caucasian female, with past medical history of breast cancer and parotid gland carcinoma  1. Diffuse liver metastasis  with obstructive jaundice 2.  AKI with hyponatremia and hypokalemia 3.  History of stage I right breast cancer in 2019, on adjuvant anastrozole 5.  History of parotid gland carcinoma in 2020, status post surgery and radiation 6. HTN   Plan:  -I have reviewed her MRI, which unfortunately showed a diffuse liver  metastasis -I spoke with pathologist Dr. Lemmie Evens this morning, her liver biopsy is consistent with carcinoma, likely from breast cancer, IHC studies are pending  -I discussed the preliminary biopsy results with patient, she was not surprised.  -I briefly discussed treatment option, which will likely be chemotherapy, due to her significant disease burden in liver and pending organ failure.  -appreciate GI input, she is scheduled to have ERCP and stent placement  -I will check tumor marker  -will f/u    Truitt Merle, MD 07/21/2019

## 2019-07-22 ENCOUNTER — Other Ambulatory Visit: Payer: Self-pay | Admitting: Hematology

## 2019-07-22 DIAGNOSIS — K831 Obstruction of bile duct: Secondary | ICD-10-CM | POA: Diagnosis not present

## 2019-07-22 LAB — CBC WITH DIFFERENTIAL/PLATELET
Abs Immature Granulocytes: 0.04 10*3/uL (ref 0.00–0.07)
Basophils Absolute: 0.1 10*3/uL (ref 0.0–0.1)
Basophils Relative: 1 %
Eosinophils Absolute: 0.1 10*3/uL (ref 0.0–0.5)
Eosinophils Relative: 1 %
HCT: 41.1 % (ref 36.0–46.0)
Hemoglobin: 14.5 g/dL (ref 12.0–15.0)
Immature Granulocytes: 1 %
Lymphocytes Relative: 8 %
Lymphs Abs: 0.6 10*3/uL — ABNORMAL LOW (ref 0.7–4.0)
MCH: 31.2 pg (ref 26.0–34.0)
MCHC: 35.3 g/dL (ref 30.0–36.0)
MCV: 88.4 fL (ref 80.0–100.0)
Monocytes Absolute: 0.9 10*3/uL (ref 0.1–1.0)
Monocytes Relative: 12 %
Neutro Abs: 5.9 10*3/uL (ref 1.7–7.7)
Neutrophils Relative %: 77 %
Platelets: 178 10*3/uL (ref 150–400)
RBC: 4.65 MIL/uL (ref 3.87–5.11)
RDW: 16.6 % — ABNORMAL HIGH (ref 11.5–15.5)
WBC: 7.6 10*3/uL (ref 4.0–10.5)
nRBC: 0 % (ref 0.0–0.2)

## 2019-07-22 LAB — COMPREHENSIVE METABOLIC PANEL
ALT: 331 U/L — ABNORMAL HIGH (ref 0–44)
AST: 972 U/L — ABNORMAL HIGH (ref 15–41)
Albumin: 2.2 g/dL — ABNORMAL LOW (ref 3.5–5.0)
Alkaline Phosphatase: 1108 U/L — ABNORMAL HIGH (ref 38–126)
Anion gap: 11 (ref 5–15)
BUN: 33 mg/dL — ABNORMAL HIGH (ref 8–23)
CO2: 22 mmol/L (ref 22–32)
Calcium: 7.7 mg/dL — ABNORMAL LOW (ref 8.9–10.3)
Chloride: 101 mmol/L (ref 98–111)
Creatinine, Ser: 1.44 mg/dL — ABNORMAL HIGH (ref 0.44–1.00)
GFR calc Af Amer: 41 mL/min — ABNORMAL LOW (ref >60)
GFR calc non Af Amer: 35 mL/min — ABNORMAL LOW (ref >60)
Glucose, Bld: 85 mg/dL (ref 70–99)
Potassium: 3.9 mmol/L (ref 3.5–5.1)
Sodium: 134 mmol/L — ABNORMAL LOW (ref 135–145)
Total Bilirubin: 19.3 mg/dL (ref 0.3–1.2)
Total Protein: 5.3 g/dL — ABNORMAL LOW (ref 6.5–8.1)

## 2019-07-22 LAB — AFP TUMOR MARKER: AFP, Serum, Tumor Marker: 2.3 ng/mL (ref 0.0–8.3)

## 2019-07-22 LAB — MAGNESIUM: Magnesium: 2.5 mg/dL — ABNORMAL HIGH (ref 1.7–2.4)

## 2019-07-22 MED ORDER — SODIUM CHLORIDE 0.9 % IV SOLN: INTRAVENOUS | Status: DC

## 2019-07-22 MED ORDER — LACTATED RINGERS IV SOLN: INTRAVENOUS | Status: DC

## 2019-07-22 NOTE — H&P (View-Only) (Signed)
° ° ° °[facility] Progress Note ° °CC: Obstructive jaundice, evaded LFTs, hepatic metastatic disease ° °Subjective: She had a little nausea this am, no vomiting. No abdominal pain. She passed a small soft yellow stool this am. No blood or melena. She is tolerating a clear liquid diet.  ° °Objective:  °Vital signs in last 24 hours: °Temp:  [97.6 °F (36.4 °C)-98.5 °F (36.9 °C)] 97.6 °F (36.4 °C) (05/06 0606) °Pulse Rate:  [65-70] 65 (05/06 0606) °Resp:  [16-20] 20 (05/06 0606) °BP: (101-123)/(59-60) 101/60 (05/06 0606) °SpO2:  [98 %-100 %] 98 % (05/06 0606) °Last BM Date: 07/21/19 °General:   Alert,  Well-developed,    in NAD °Eyes: Scleral icterus present.  Conjunctiva pink. °Heart: RRR, no murmur.  °Pulm:  Breath sounds clear throughout.  °Abdomen: Soft, nondistended. No mass. No HSM. + BS x 4 quads. °Extremities:  Without edema. °Neurologic:  Alert and  oriented x4;  grossly normal neurologically. °Psych:  Alert and cooperative. Normal mood and affect. °Skin: Jaundice. ° °Intake/Output from previous day: °05/05 0701 - 05/06 0700 °In: 1432.6 [P.O.:480; I.V.:901.4; IV Piggyback:51.3] °Out: 900 [Urine:900] °Intake/Output this shift: °Total I/O °In: 578.8 [P.O.:480; IV Piggyback:98.8] °Out: -  ° °Lab Results: °Recent Labs  °  07/20/19 °1130 07/20/19 °1958 07/22/19 °0451  °WBC 8.8 7.8 7.6  °HGB 15.1* 13.8 14.5  °HCT 42.6 39.3 41.1  °PLT 207 185 178  ° °BMET °Recent Labs  °  07/20/19 °1130 07/20/19 °1130 07/20/19 °1958 07/21/19 °0437 07/22/19 °0451  °NA 129*  --   --  132* 134*  °K 2.6*  --   --  3.0* 3.9  °CL 87*  --   --  99 101  °CO2 28  --   --  23 22  °GLUCOSE 135*  --   --  95 85  °BUN 35*  --   --  37* 33*  °CREATININE 1.31*   < > 1.38* 1.35* 1.44*  °CALCIUM 8.5*  --   --  7.3* 7.7*  ° < > = values in this interval not displayed.  ° °LFT °Recent Labs  °  07/20/19 °1511 07/21/19 °0437 07/22/19 °0451  °PROT 5.7*   < > 5.3*  °ALBUMIN 2.3*   < > 2.2*  °AST 1,026*   < > 972*  °ALT 378*   < > 331*   °ALKPHOS 1,054*   < > 1,108*  °BILITOT 18.8*   < > 19.3*  °BILIDIR 12.3*  --   --   °IBILI 6.5*  --   --   ° < > = values in this interval not displayed.  ° °PT/INR °Recent Labs  °  07/20/19 °1130 07/21/19 °0437  °LABPROT 14.3 17.0*  °INR 1.2 1.4*  ° °Hepatitis Panel °Recent Labs  °  07/20/19 °1511  °HEPBSAG NON REACTIVE  °HCVAB NON REACTIVE  °HEPAIGM NON REACTIVE  °HEPBIGM NON REACTIVE  ° ° °MR 3D Recon At Scanner ° °Result Date: 07/21/2019 °CLINICAL DATA:  Jaundice, biliary obstruction. Liver lesion biopsy. History of breast and parotid cancer. EXAM: MRI ABDOMEN WITHOUT AND WITH CONTRAST (INCLUDING MRCP) TECHNIQUE: Multiplanar multisequence MR imaging of the abdomen was performed both before and after the administration of intravenous contrast. Heavily T2-weighted images of the biliary and pancreatic ducts were obtained, and three-dimensional MRCP images were rendered by post processing. CONTRAST:  7.5mL GADAVIST GADOBUTROL 1 MMOL/ML IV SOLN COMPARISON:  CT chest 07/05/2019. CT abdomen  03/27/2017 FINDINGS: Despite efforts by the technologist and patient, motion artifact is present   on today's exam and could not be eliminated. This reduces exam sensitivity and specificity. Lower chest: Trace right pleural effusion. Hepatobiliary: Widespread metastatic lesions throughout the liver with associated restriction of diffusion. An index T1 hypointense lesion centrally in the right hepatic lobe measures 4.0 by 3.0 cm on image 30/8. All segments of the liver have metastatic lesions. Diffuse gallbladder wall thickening. Intrahepatic biliary dilatation is observed with central truncation of the biliary tree. Given the bulk of tumor concentrated in this vicinity, the likelihood of central obstructing mass as a cause is high. The appearance could be from confluent metastatic lesions exerting extrinsic effect on the biliary tree, or a central primary hepatobiliary malignancy such as cholangiocarcinoma. The extrahepatic biliary  tree distal to the central obstruction is not dilated. Portal vein remains patent. Pancreas:  Unremarkable Spleen:  Unremarkable Adrenals/Urinary Tract: Small nonenhancing right renal lesions favor cysts. Adrenal glands normal. Stomach/Bowel: On coronal images there is a sense of fullness in the ascending colon, possibly due to contraction although tumor is difficult to totally exclude given the appearance. Vascular/Lymphatic: Questionable adenopathy along the porta hepatis. Aortoiliac atherosclerotic vascular disease. No portal vein thrombosis identified. Other:  Upper abdominal ascites. Musculoskeletal: Unremarkable IMPRESSION: 1. Widespread metastatic disease throughout the liver. Dominant index lesion is in the right hepatic lobe measuring 4.0 by 3.0 cm. 2. Intrahepatic biliary dilatation with central truncation of the biliary tree. The appearance could be due to extrinsic effect on the biliary tree or a central primary hepatobiliary malignancy such as cholangiocarcinoma. 3. Questionable adenopathy along the porta hepatis. 4. Apparent fullness in the ascending colon, possibly due to contraction although tumor is difficult to totally exclude given the appearance. 5. Trace right pleural effusion.  Upper abdominal ascites. 6. Despite efforts by the technologist and patient, motion artifact is present on today's exam and could not be eliminated. This reduces exam sensitivity and specificity. Electronically Signed   By: Walter  Liebkemann M.D.   On: 07/21/2019 07:28  ° °MR ABDOMEN WITH MRCP W CONTRAST ° °Result Date: 07/21/2019 °CLINICAL DATA:  Jaundice, biliary obstruction. Liver lesion biopsy. History of breast and parotid cancer. EXAM: MRI ABDOMEN WITHOUT AND WITH CONTRAST (INCLUDING MRCP) TECHNIQUE: Multiplanar multisequence MR imaging of the abdomen was performed both before and after the administration of intravenous contrast. Heavily T2-weighted images of the biliary and pancreatic ducts were obtained, and  three-dimensional MRCP images were rendered by post processing. CONTRAST:  7.5mL GADAVIST GADOBUTROL 1 MMOL/ML IV SOLN COMPARISON:  CT chest 07/05/2019. CT abdomen  03/27/2017 FINDINGS: Despite efforts by the technologist and patient, motion artifact is present on today's exam and could not be eliminated. This reduces exam sensitivity and specificity. Lower chest: Trace right pleural effusion. Hepatobiliary: Widespread metastatic lesions throughout the liver with associated restriction of diffusion. An index T1 hypointense lesion centrally in the right hepatic lobe measures 4.0 by 3.0 cm on image 30/8. All segments of the liver have metastatic lesions. Diffuse gallbladder wall thickening. Intrahepatic biliary dilatation is observed with central truncation of the biliary tree. Given the bulk of tumor concentrated in this vicinity, the likelihood of central obstructing mass as a cause is high. The appearance could be from confluent metastatic lesions exerting extrinsic effect on the biliary tree, or a central primary hepatobiliary malignancy such as cholangiocarcinoma. The extrahepatic biliary tree distal to the central obstruction is not dilated. Portal vein remains patent. Pancreas:  Unremarkable Spleen:  Unremarkable Adrenals/Urinary Tract: Small nonenhancing right renal lesions favor cysts. Adrenal glands normal. Stomach/Bowel: On coronal images there   is a sense of fullness in the ascending colon, possibly due to contraction although tumor is difficult to totally exclude given the appearance. Vascular/Lymphatic: Questionable adenopathy along the porta hepatis. Aortoiliac atherosclerotic vascular disease. No portal vein thrombosis identified. Other:  Upper abdominal ascites. Musculoskeletal: Unremarkable IMPRESSION: 1. Widespread metastatic disease throughout the liver. Dominant index lesion is in the right hepatic lobe measuring 4.0 by 3.0 cm. 2. Intrahepatic biliary dilatation with central truncation of the  biliary tree. The appearance could be due to extrinsic effect on the biliary tree or a central primary hepatobiliary malignancy such as cholangiocarcinoma. 3. Questionable adenopathy along the porta hepatis. 4. Apparent fullness in the ascending colon, possibly due to contraction although tumor is difficult to totally exclude given the appearance. 5. Trace right pleural effusion.  Upper abdominal ascites. 6. Despite efforts by the technologist and patient, motion artifact is present on today's exam and could not be eliminated. This reduces exam sensitivity and specificity. Electronically Signed   By: Walter  Liebkemann M.D.   On: 07/21/2019 07:28  ° °US CORE BIOPSY (LIVER) ° °Result Date: 07/20/2019 °INDICATION: 74-year-old female with a past history of both breast and carotid carcinoma. She now has extensive multifocal liver lesions and presents for ultrasound-guided core biopsy of the same. EXAM: ULTRASOUND BIOPSY CORE LIVER MEDICATIONS: None. ANESTHESIA/SEDATION: Moderate (conscious) sedation was employed during this procedure. A total of Versed 2 mg and Fentanyl 100 mcg was administered intravenously. Moderate Sedation Time: 10 minutes. The patient's level of consciousness and vital signs were monitored continuously by radiology nursing throughout the procedure under my direct supervision. FLUOROSCOPY TIME:  None COMPLICATIONS: None immediate. PROCEDURE: Informed written consent was obtained from the patient after a thorough discussion of the procedural risks, benefits and alternatives. All questions were addressed. A timeout was performed prior to the initiation of the procedure. The liver was interrogated with ultrasound. There are numerous hypoechoic solid lesions scattered throughout the liver. Additionally, there is intrahepatic biliary duct dilatation involving both the right and left hepatic lobes. The common bile duct is mildly dilated at 8 mm. The gallbladder is contracted.  No stones are visualized. A  suitable lesion was identified in segment 4 B of the right liver. A skin entry site was selected and marked. The overlying skin was sterilely prepped and draped in the standard fashion using chlorhexidine skin prep. Local anesthesia was attained by infiltration with 1% lidocaine. A small dermatotomy was made. Under real-time sonographic guidance, a 17 gauge introducer needle was advanced into the margin of the mass. Multiple 18 gauge core biopsies were then coaxially obtained using the Bio Pince automated biopsy device. Biopsy specimens were placed in formalin and delivered to pathology for further analysis. As the introducer needle was removed, the biopsy tract was embolized with a Gel-Foam slurry. Post biopsy ultrasound imaging demonstrates no evidence of immediate complication. IMPRESSION: Ultrasound-guided core biopsy of hepatic lesion. Incidentally noted, there is significant intra and extrahepatic biliary ductal dilatation which is new/progressed compared to the incomplete CT imaging from July 05, 2019. PLAN: Laboratory and imaging findings suggesting malignant obstructed jaundice and associated hyperbilirubinemia were discussed with the patient's radiation oncologist, Dr. Squire. Doctor Squire's office is ranging to have the patient direct admitted to the hospital by the hospitalist service. Electronically Signed   By: Heath  McCullough M.D.   On: 07/20/2019 15:24  ° °US Abdomen Limited RUQ ° °Result Date: 07/21/2019 °CLINICAL DATA:  Ascites EXAM: LIMITED ABDOMEN ULTRASOUND FOR ASCITES TECHNIQUE: Limited ultrasound survey for ascites was performed in   all four abdominal quadrants. COMPARISON:  MRI 07/20/2019 FINDINGS: Trace abdominopelvic ascites with the largest fluid pocket noted in the pelvis near midline. Heterogeneous appearance of the visualized portion of the liver, better characterized on recent MRI. IMPRESSION: Trace abdominopelvic ascites. Electronically Signed   By: Nicholas  Plundo D.O.   On:  07/21/2019 12:31  ° ° °Assessment / Plan: ° °1.  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 She underwent a sono guided core liver biopsy by IR on 07/20/2019, results pending.  She became hypotensive following the liver biopsy. Laboratory studies were consistent with acute cholestasis therefore she was admitted to the hospital for further evaluation.  An abdominal MRI with MRCP 5/4  identified widespread metastatic disease throughout the liver.  A dominant lesion to the right hepatic lobe measured 4.0 x 3.0 cm, intrahepatic biliary dilatation with central truncation of the biliary tree concerning for primary hepatobiliary malignancy/cholangiocarcinoma.  Diffuse gallbladder wall thickening. Upper abdominal ascites.  Fullness to the ascending colon was noted, a tumor could not be excluded.   Liver biopsy results were consistent with metastatic breast carcinoma.  °Alk phos 1054 -> 978 ->1108. T. Bili 18.8 -> 16.9 -> 19.3. AST 1026 -> 929 -> 972. ALT 378 -> 320 -> 331. AFP. INR 1.3 -> 1.4. WBC 7.8. She is afebrile. She is hemodynamically stable.  °-BMP, hepatic panel and PT/INR in am  °-Proceed with ERCP on Friday 07/23/2019 with Dr. Jacobs as scheduled °-NPO after midnight  °-Continue Zosyn IV for now °-Ondansetron 4mg po or IV Q 6 hrs PRN nausea  ° °2. GERD °-Continue Famotidine 20mg daily  °  °3. CTAP showed fullness to the ascending colon. Last colonoscopy in 2010 showed diverticulosis. No polyps.  °-No plans for a colonoscopy at this time °  °4. Coagulopathy. INR 1.3 -> 1.4. secondary to # 1 °-Repeat PT/INR in am ° °Further recommendations per Dr. Beavers  ° °Principal Problem: °  Obstructive jaundice °Active Problems: °  Malignant neoplasm of lower-inner quadrant of right breast of female, estrogen receptor positive (HCC) °  Cancer of parotid  gland (HCC) °  Nodule on liver °  Hypokalemia °  Hyponatremia °  Transaminitis °  Hypotension °  Elevated bilirubin ° ° ° ° LOS: 2 days  ° °[name]  07/22/2019, 10:31 AM ° ° ° °

## 2019-07-22 NOTE — Progress Notes (Signed)
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  74 year old female who presented with hypotension after a ultrasound-guided liver biopsy by IR.  Patient does have the significant past medical history for hypertension, dyslipidemia, breast/parotid cancer and GERD.  Patient reported about 7 days of skin discoloration, associated with nausea, vomiting, decreased appetite and generalized weakness.  July 05, 2019 she was diagnosed with liver lesions per CT scan of the chest which was done prior to follow-up with cancer surveillance.  Patient was referred for IR guided liver biopsy.  Her preoperative studies showed hyponatremia, hypokalemia, hyperbilirubinemia and elevated liver enzymes.  After ultrasound-guided biopsy, her blood pressure was 66/36, with IV fluids improved to 101/55, heart rate 73, respiratory rate 16, temperature 97.9, oxygen saturation 99%, her lungs were clear to auscultation bilaterally, heart S1-S2 present rhythmic, her abdomen was soft and nontender, no lower extremity edema.  MRCP with widespread metastatic disease to the liver.  Dominant index lesion in the right hepatic lobe 4.0 x 3.0 cm.  Intrahepatic biliary dilatation with central truncation of the biliary tree.  Patient has been placed on intravenous fluids and close hemodynamic monitoring.  Plan for ERCP on 07/23/19, pathology still pending.   Assessment & Plan:   Principal Problem:   Obstructive jaundice Active Problems:   Malignant neoplasm of lower-inner quadrant of right breast of female, estrogen receptor positive (HCC)   Cancer of parotid gland (HCC)   Nodule on liver   Hypokalemia   Hyponatremia   Transaminitis   Hypotension   Elevated bilirubin    1. Obstructive jaundice (direct hyperbilirubinemia) with  high risk for cholangitis. Patient tolerating clears liquids with no nausea or vomiting, no significant abdominal pain. Worsening liver panel with ALKP 1,108, AST 972, ALT 331, T Bil 19,3. WBC stable at 7,6. Liver biopsy result positive for metastatic breast cancer.   Will continue hydration with IV fluids and IV antibiotic therapy with Zosyn. Supportive medical therapy with analgesics (morphine and oxycodone), antiemetics and antiacids. Plan for ERCP in am. NPO past midnight.   2. HTN/ dyslipidemia. (Holding telemisartan and hctz). Blood pressure 101/60 mmHg today.  Continue holding statin therapy for now, due to elevated liver enzymes.  3. AKI with Hyponatremia and hypokalemia. Worsening renal function with serum cr up to 1,44 with K at 3.9, Cl 101, Na 134 and serum bicarbonate at 22. Patient with poor oral intake.   Change IV fluids to balanced electrolyte solutions with LR and will increase rate at 100 ml per H. Follow up renal panel in am, avoid hypotension and nephrotoxic medications.   4. Hx of breast cancer. On anastrazole. Now with diffuse liver metastasis, will follow up with Dr. Burr Medico from oncology as outpatient.  5. GERD. On famotidine.    Status is: Inpatient  Remains inpatient appropriate because:IV treatments appropriate due to intensity of illness or inability to take PO   Dispo: The patient is from: Home              Anticipated d/c is to: Home              Anticipated d/c date is: > 3 days              Patient currently is not medically stable to d/c.        DVT prophylaxis:  Heparin   Code Status:   full  Family Communication:  No family at the bedside      Nutrition Status: Nutrition Problem: Inadequate oral intake Etiology: nausea, vomiting, decreased appetite Signs/Symptoms: per patient/family report(progressive over the past month) Interventions: Boost Breeze      Consultants:   GI   Oncology      Antimicrobials:   IV Zosyn      Subjective: Patient on clear liquid diet with no nausea or vomiting, no significant abdominal pain, no dyspnea or chest pain.   Objective: Vitals:   07/21/19 0623 07/21/19 1346 07/21/19 2132 07/22/19 0606  BP:  123/60 (!) 102/59 101/60  Pulse: 63 70 70 65  Resp: 19 18 16 20   Temp: 97.8 F (36.6 C) 98.5 F (36.9 C) 98.2 F (36.8 C) 97.6 F (36.4 C)  TempSrc: Oral Oral Oral Oral  SpO2: 98% 100% 98% 98%  Weight:      Height:        Intake/Output Summary (Last 24 hours) at 07/22/2019 1314 Last data filed at 07/22/2019 1000 Gross per 24 hour  Intake 2011.43 ml  Output 925 ml  Net 1086.43 ml   Filed Weights   07/20/19 2051  Weight: 77.1 kg    Examination:   General: Not in pain or dyspnea, deconditioned  Neurology: Awake and alert, non focal  E ENT: positive pallor, positive icterus, oral mucosa dry Cardiovascular: No JVD. S1-S2 present, rhythmic, no gallops, rubs, or murmurs. No lower extremity edema. Pulmonary: positive breath sounds bilaterally, adequate air movement, no wheezing, rhonchi or rales. Gastrointestinal. Abdomen with no organomegaly, non tender, no rebound or guarding Skin. No rashes Musculoskeletal: no joint deformities     Data Reviewed: I have personally reviewed following labs and imaging studies  CBC: Recent Labs  Lab 07/20/19 1130 07/20/19 1958 07/22/19 0451  WBC 8.8 7.8 7.6  NEUTROABS 7.4  --  5.9  HGB 15.1* 13.8 14.5  HCT 42.6 39.3 41.1  MCV 89.3 89.1 88.4  PLT 207 185 0000000   Basic Metabolic Panel: Recent Labs  Lab 07/20/19 1130 07/20/19 1958 07/21/19 0437 07/22/19 0451  NA 129*  --  132* 134*  K 2.6*  --  3.0* 3.9  CL 87*  --  99 101  CO2 28  --  23 22  GLUCOSE 135*  --  95 85  BUN 35*  --  37* 33*  CREATININE 1.31* 1.38* 1.35* 1.44*  CALCIUM 8.5*  --  7.3* 7.7*  MG  --   --  2.5* 2.5*   GFR: Estimated Creatinine Clearance: 34.6 mL/min (A) (by C-G formula based on SCr of 1.44 mg/dL (H)). Liver Function Tests: Recent  Labs  Lab 07/20/19 1130 07/20/19 1511 07/21/19 0437 07/22/19 0451  AST 1,193* 1,026* 929* 972*  ALT 462* 378* 320* 331*  ALKPHOS 1,251* 1,054* 978* 1,108*  BILITOT 22.5* 18.8* 16.9* 19.3*  PROT 6.7 5.7* 5.3* 5.3*  ALBUMIN 2.8* 2.3* 2.4* 2.2*   Recent Labs  Lab 07/20/19 1511  LIPASE 143*   No results for input(s): AMMONIA in the last 168 hours. Coagulation Profile: Recent Labs  Lab 07/20/19 1130 07/21/19 0437  INR 1.2 1.4*   Cardiac Enzymes: No results for input(s): CKTOTAL, CKMB, CKMBINDEX, TROPONINI in the last 168 hours. BNP (last 3 results) No results for input(s): PROBNP in the last 8760 hours. HbA1C: Recent Labs    07/20/19 1958  HGBA1C 5.9*   CBG: No results for input(s): GLUCAP in the last 168 hours. Lipid  Profile: No results for input(s): CHOL, HDL, LDLCALC, TRIG, CHOLHDL, LDLDIRECT in the last 72 hours. Thyroid Function Tests: Recent Labs    07/20/19 1958  TSH 1.232   Anemia Panel: No results for input(s): VITAMINB12, FOLATE, FERRITIN, TIBC, IRON, RETICCTPCT in the last 72 hours.    Radiology Studies: I have reviewed all of the imaging during this hospital visit personally     Scheduled Meds: . anastrozole  1 mg Oral Daily  . diphenhydrAMINE  50 mg Oral Once   Or  . diphenhydrAMINE  50 mg Intravenous Once  . famotidine  20 mg Oral BID  . feeding supplement  1 Container Oral TID BM  . heparin  5,000 Units Subcutaneous Q8H   Continuous Infusions: . sodium chloride 75 mL/hr at 07/22/19 1033  . piperacillin-tazobactam (ZOSYN)  IV 3.375 g (07/22/19 1041)     LOS: 2 days        [place] Kretz Gerome Apley, MD

## 2019-07-22 NOTE — Progress Notes (Signed)
[facility] Progress Note  CC: Obstructive jaundice, evaded LFTs, hepatic metastatic disease  Subjective: She had a little nausea this am, no vomiting. No abdominal pain. She passed a small soft yellow stool this am. No blood or melena. She is tolerating a clear liquid diet.   Objective:  Vital signs in last 24 hours: Temp:  [97.6 F (36.4 C)-98.5 F (36.9 C)] 97.6 F (36.4 C) (05/06 0606) Pulse Rate:  [65-70] 65 (05/06 0606) Resp:  [16-20] 20 (05/06 0606) BP: (101-123)/(59-60) 101/60 (05/06 0606) SpO2:  [98 %-100 %] 98 % (05/06 0606) Last BM Date: 07/21/19 General:   Alert,  Well-developed,    in NAD Eyes: Scleral icterus present.  Conjunctiva pink. Heart: RRR, no murmur.  Pulm:  Breath sounds clear throughout.  Abdomen: Soft, nondistended. No mass. No HSM. + BS x 4 quads. Extremities:  Without edema. Neurologic:  Alert and  oriented x4;  grossly normal neurologically. Psych:  Alert and cooperative. Normal mood and affect. Skin: Jaundice.  Intake/Output from previous day: 05/05 0701 - 05/06 0700 In: 1432.6 [P.O.:480; I.V.:901.4; IV Piggyback:51.3] Out: 900 [Urine:900] Intake/Output this shift: Total I/O In: 578.8 [P.O.:480; IV Piggyback:98.8] Out: -   Lab Results: Recent Labs    07/20/19 1130 07/20/19 1958 07/22/19 0451  WBC 8.8 7.8 7.6  HGB 15.1* 13.8 14.5  HCT 42.6 39.3 41.1  PLT 207 185 178   BMET Recent Labs    07/20/19 1130 07/20/19 1130 07/20/19 1958 07/21/19 0437 07/22/19 0451  NA 129*  --   --  132* 134*  K 2.6*  --   --  3.0* 3.9  CL 87*  --   --  99 101  CO2 28  --   --  23 22  GLUCOSE 135*  --   --  95 85  BUN 35*  --   --  37* 33*  CREATININE 1.31*   < > 1.38* 1.35* 1.44*  CALCIUM 8.5*  --   --  7.3* 7.7*   < > = values in this interval not displayed.   LFT Recent Labs    07/20/19 1511 07/21/19 0437 07/22/19 0451  PROT 5.7*   < > 5.3*  ALBUMIN 2.3*   < > 2.2*  AST 1,026*   < > 972*  ALT 378*   < > 331*   ALKPHOS 1,054*   < > 1,108*  BILITOT 18.8*   < > 19.3*  BILIDIR 12.3*  --   --   IBILI 6.5*  --   --    < > = values in this interval not displayed.   PT/INR Recent Labs    07/20/19 1130 07/21/19 0437  LABPROT 14.3 17.0*  INR 1.2 1.4*   Hepatitis Panel Recent Labs    07/20/19 1511  HEPBSAG NON REACTIVE  HCVAB NON REACTIVE  HEPAIGM NON REACTIVE  HEPBIGM NON REACTIVE    MR 3D Recon At Scanner  Result Date: 07/21/2019 CLINICAL DATA:  Jaundice, biliary obstruction. Liver lesion biopsy. History of breast and parotid cancer. EXAM: MRI ABDOMEN WITHOUT AND WITH CONTRAST (INCLUDING MRCP) TECHNIQUE: Multiplanar multisequence MR imaging of the abdomen was performed both before and after the administration of intravenous contrast. Heavily T2-weighted images of the biliary and pancreatic ducts were obtained, and three-dimensional MRCP images were rendered by post processing. CONTRAST:  7.90m GADAVIST GADOBUTROL 1 MMOL/ML IV SOLN COMPARISON:  CT chest 07/05/2019. CT abdomen  03/27/2017 FINDINGS: Despite efforts by the technologist and patient, motion artifact is  on today's exam and could not be eliminated. This reduces exam sensitivity and specificity. Lower chest: Trace right pleural effusion. Hepatobiliary: Widespread metastatic lesions throughout the liver with associated restriction of diffusion. An index T1 hypointense lesion centrally in the right hepatic lobe measures 4.0 by 3.0 cm on image 30/8. All segments of the liver have metastatic lesions. Diffuse gallbladder wall thickening. Intrahepatic biliary dilatation is observed with central truncation of the biliary tree. Given the bulk of tumor concentrated in this vicinity, the likelihood of central obstructing mass as a cause is high. The appearance could be from confluent metastatic lesions exerting extrinsic effect on the biliary tree, or a central primary hepatobiliary malignancy such as cholangiocarcinoma. The extrahepatic biliary  tree distal to the central obstruction is not dilated. Portal vein remains patent. Pancreas:  Unremarkable Spleen:  Unremarkable Adrenals/Urinary Tract: Small nonenhancing right renal lesions favor cysts. Adrenal glands normal. Stomach/Bowel: On coronal images there is a sense of fullness in the ascending colon, possibly due to contraction although tumor is difficult to totally exclude given the appearance. Vascular/Lymphatic: Questionable adenopathy along the porta hepatis. Aortoiliac atherosclerotic vascular disease. No portal vein thrombosis identified. Other:  Upper abdominal ascites. Musculoskeletal: Unremarkable IMPRESSION: 1. Widespread metastatic disease throughout the liver. Dominant index lesion is in the right hepatic lobe measuring 4.0 by 3.0 cm. 2. Intrahepatic biliary dilatation with central truncation of the biliary tree. The appearance could be due to extrinsic effect on the biliary tree or a central primary hepatobiliary malignancy such as cholangiocarcinoma. 3. Questionable adenopathy along the porta hepatis. 4. Apparent fullness in the ascending colon, possibly due to contraction although tumor is difficult to totally exclude given the appearance. 5. Trace right pleural effusion.  Upper abdominal ascites. 6. Despite efforts by the technologist and patient, motion artifact is present on today's exam and could not be eliminated. This reduces exam sensitivity and specificity. Electronically Signed   By: Walter  Liebkemann M.D.   On: 07/21/2019 07:28  ° °MR ABDOMEN WITH MRCP W CONTRAST ° °Result Date: 07/21/2019 °CLINICAL DATA:  Jaundice, biliary obstruction. Liver lesion biopsy. History of breast and parotid cancer. EXAM: MRI ABDOMEN WITHOUT AND WITH CONTRAST (INCLUDING MRCP) TECHNIQUE: Multiplanar multisequence MR imaging of the abdomen was performed both before and after the administration of intravenous contrast. Heavily T2-weighted images of the biliary and pancreatic ducts were obtained, and  three-dimensional MRCP images were rendered by post processing. CONTRAST:  7.5mL GADAVIST GADOBUTROL 1 MMOL/ML IV SOLN COMPARISON:  CT chest 07/05/2019. CT abdomen  03/27/2017 FINDINGS: Despite efforts by the technologist and patient, motion artifact is present on today's exam and could not be eliminated. This reduces exam sensitivity and specificity. Lower chest: Trace right pleural effusion. Hepatobiliary: Widespread metastatic lesions throughout the liver with associated restriction of diffusion. An index T1 hypointense lesion centrally in the right hepatic lobe measures 4.0 by 3.0 cm on image 30/8. All segments of the liver have metastatic lesions. Diffuse gallbladder wall thickening. Intrahepatic biliary dilatation is observed with central truncation of the biliary tree. Given the bulk of tumor concentrated in this vicinity, the likelihood of central obstructing mass as a cause is high. The appearance could be from confluent metastatic lesions exerting extrinsic effect on the biliary tree, or a central primary hepatobiliary malignancy such as cholangiocarcinoma. The extrahepatic biliary tree distal to the central obstruction is not dilated. Portal vein remains patent. Pancreas:  Unremarkable Spleen:  Unremarkable Adrenals/Urinary Tract: Small nonenhancing right renal lesions favor cysts. Adrenal glands normal. Stomach/Bowel: On coronal images there   is a sense of fullness in the ascending colon, possibly due to contraction although tumor is difficult to totally exclude given the appearance. Vascular/Lymphatic: Questionable adenopathy along the porta hepatis. Aortoiliac atherosclerotic vascular disease. No portal vein thrombosis identified. Other:  Upper abdominal ascites. Musculoskeletal: Unremarkable IMPRESSION: 1. Widespread metastatic disease throughout the liver. Dominant index lesion is in the right hepatic lobe measuring 4.0 by 3.0 cm. 2. Intrahepatic biliary dilatation with central truncation of the  biliary tree. The appearance could be due to extrinsic effect on the biliary tree or a central primary hepatobiliary malignancy such as cholangiocarcinoma. 3. Questionable adenopathy along the porta hepatis. 4. Apparent fullness in the ascending colon, possibly due to contraction although tumor is difficult to totally exclude given the appearance. 5. Trace right pleural effusion.  Upper abdominal ascites. 6. Despite efforts by the technologist and patient, motion artifact is present on today's exam and could not be eliminated. This reduces exam sensitivity and specificity. Electronically Signed   By: Walter  Liebkemann M.D.   On: 07/21/2019 07:28  ° °US CORE BIOPSY (LIVER) ° °Result Date: 07/20/2019 °INDICATION: 74-year-old female with a past history of both breast and carotid carcinoma. She now has extensive multifocal liver lesions and presents for ultrasound-guided core biopsy of the same. EXAM: ULTRASOUND BIOPSY CORE LIVER MEDICATIONS: None. ANESTHESIA/SEDATION: Moderate (conscious) sedation was employed during this procedure. A total of Versed 2 mg and Fentanyl 100 mcg was administered intravenously. Moderate Sedation Time: 10 minutes. The patient's level of consciousness and vital signs were monitored continuously by radiology nursing throughout the procedure under my direct supervision. FLUOROSCOPY TIME:  None COMPLICATIONS: None immediate. PROCEDURE: Informed written consent was obtained from the patient after a thorough discussion of the procedural risks, benefits and alternatives. All questions were addressed. A timeout was performed prior to the initiation of the procedure. The liver was interrogated with ultrasound. There are numerous hypoechoic solid lesions scattered throughout the liver. Additionally, there is intrahepatic biliary duct dilatation involving both the right and left hepatic lobes. The common bile duct is mildly dilated at 8 mm. The gallbladder is contracted.  No stones are visualized. A  suitable lesion was identified in segment 4 B of the right liver. A skin entry site was selected and marked. The overlying skin was sterilely prepped and draped in the standard fashion using chlorhexidine skin prep. Local anesthesia was attained by infiltration with 1% lidocaine. A small dermatotomy was made. Under real-time sonographic guidance, a 17 gauge introducer needle was advanced into the margin of the mass. Multiple 18 gauge core biopsies were then coaxially obtained using the Bio Pince automated biopsy device. Biopsy specimens were placed in formalin and delivered to pathology for further analysis. As the introducer needle was removed, the biopsy tract was embolized with a Gel-Foam slurry. Post biopsy ultrasound imaging demonstrates no evidence of immediate complication. IMPRESSION: Ultrasound-guided core biopsy of hepatic lesion. Incidentally noted, there is significant intra and extrahepatic biliary ductal dilatation which is new/progressed compared to the incomplete CT imaging from July 05, 2019. PLAN: Laboratory and imaging findings suggesting malignant obstructed jaundice and associated hyperbilirubinemia were discussed with the patient's radiation oncologist, Dr. Squire. Doctor Squire's office is ranging to have the patient direct admitted to the hospital by the hospitalist service. Electronically Signed   By: Heath  McCullough M.D.   On: 07/20/2019 15:24  ° °US Abdomen Limited RUQ ° °Result Date: 07/21/2019 °CLINICAL DATA:  Ascites EXAM: LIMITED ABDOMEN ULTRASOUND FOR ASCITES TECHNIQUE: Limited ultrasound survey for ascites was performed in   all four abdominal quadrants. COMPARISON:  MRI 07/20/2019 FINDINGS: Trace abdominopelvic ascites with the largest fluid pocket noted in the pelvis near midline. Heterogeneous appearance of the visualized portion of the liver, better characterized on recent MRI. IMPRESSION: Trace abdominopelvic ascites. Electronically Signed   By: Nicholas  Plundo D.O.   On:  07/21/2019 12:31  ° ° °Assessment / Plan: ° °1.  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 She underwent a sono guided core liver biopsy by IR on 07/20/2019, results pending.  She became hypotensive following the liver biopsy. Laboratory studies were consistent with acute cholestasis therefore she was admitted to the hospital for further evaluation.  An abdominal MRI with MRCP 5/4  identified widespread metastatic disease throughout the liver.  A dominant lesion to the right hepatic lobe measured 4.0 x 3.0 cm, intrahepatic biliary dilatation with central truncation of the biliary tree concerning for primary hepatobiliary malignancy/cholangiocarcinoma.  Diffuse gallbladder wall thickening. Upper abdominal ascites.  Fullness to the ascending colon was noted, a tumor could not be excluded.   Liver biopsy results were consistent with metastatic breast carcinoma.  °Alk phos 1054 -> 978 ->1108. T. Bili 18.8 -> 16.9 -> 19.3. AST 1026 -> 929 -> 972. ALT 378 -> 320 -> 331. AFP. INR 1.3 -> 1.4. WBC 7.8. She is afebrile. She is hemodynamically stable.  °-BMP, hepatic panel and PT/INR in am  °-Proceed with ERCP on Friday 07/23/2019 with Dr. Jacobs as scheduled °-NPO after midnight  °-Continue Zosyn IV for now °-Ondansetron 4mg po or IV Q 6 hrs PRN nausea  ° °2. GERD °-Continue Famotidine 20mg daily  °  °3. CTAP showed fullness to the ascending colon. Last colonoscopy in 2010 showed diverticulosis. No polyps.  °-No plans for a colonoscopy at this time °  °4. Coagulopathy. INR 1.3 -> 1.4. secondary to # 1 °-Repeat PT/INR in am ° °Further recommendations per Dr. Beavers  ° °Principal Problem: °  Obstructive jaundice °Active Problems: °  Malignant neoplasm of lower-inner quadrant of right breast of female, estrogen receptor positive (HCC) °  Cancer of parotid  gland (HCC) °  Nodule on liver °  Hypokalemia °  Hyponatremia °  Transaminitis °  Hypotension °  Elevated bilirubin ° ° ° ° LOS: 2 days  ° °Colleen M Kennedy-Smith  07/22/2019, 10:31 AM ° ° ° °

## 2019-07-22 NOTE — Progress Notes (Signed)
Critical tot bilirubin 19.3. On call Sharlet Salina notified via page.

## 2019-07-23 ENCOUNTER — Telehealth: Payer: Self-pay

## 2019-07-23 ENCOUNTER — Telehealth: Payer: Self-pay | Admitting: Hematology

## 2019-07-23 ENCOUNTER — Encounter (HOSPITAL_COMMUNITY): Payer: Self-pay | Admitting: Internal Medicine

## 2019-07-23 ENCOUNTER — Inpatient Hospital Stay (HOSPITAL_COMMUNITY): Payer: Medicare PPO

## 2019-07-23 ENCOUNTER — Inpatient Hospital Stay (HOSPITAL_COMMUNITY): Payer: Medicare PPO | Admitting: Certified Registered"

## 2019-07-23 ENCOUNTER — Encounter (HOSPITAL_COMMUNITY)
Admission: EM | Disposition: A | Discharge status: Skilled Nursing Facility | Payer: Self-pay | Source: Home / Self Care | Attending: Internal Medicine

## 2019-07-23 DIAGNOSIS — K831 Obstruction of bile duct: Secondary | ICD-10-CM | POA: Diagnosis not present

## 2019-07-23 DIAGNOSIS — Z9689 Presence of other specified functional implants: Secondary | ICD-10-CM

## 2019-07-23 HISTORY — PX: ERCP: SHX5425

## 2019-07-23 HISTORY — PX: BILIARY DILATION: SHX6850

## 2019-07-23 HISTORY — PX: SPHINCTEROTOMY: SHX5544

## 2019-07-23 HISTORY — PX: PANCREATIC STENT PLACEMENT: SHX5539

## 2019-07-23 HISTORY — PX: BILIARY STENT PLACEMENT: SHX5538

## 2019-07-23 LAB — CBC WITH DIFFERENTIAL/PLATELET
Abs Immature Granulocytes: 0.03 10*3/uL (ref 0.00–0.07)
Basophils Absolute: 0.1 10*3/uL (ref 0.0–0.1)
Basophils Relative: 1 %
Eosinophils Absolute: 0.1 10*3/uL (ref 0.0–0.5)
Eosinophils Relative: 2 %
HCT: 43 % (ref 36.0–46.0)
Hemoglobin: 14.7 g/dL (ref 12.0–15.0)
Immature Granulocytes: 0 %
Lymphocytes Relative: 6 %
Lymphs Abs: 0.5 10*3/uL — ABNORMAL LOW (ref 0.7–4.0)
MCH: 30.7 pg (ref 26.0–34.0)
MCHC: 34.2 g/dL (ref 30.0–36.0)
MCV: 89.8 fL (ref 80.0–100.0)
Monocytes Absolute: 0.9 10*3/uL (ref 0.1–1.0)
Monocytes Relative: 13 %
Neutro Abs: 5.7 10*3/uL (ref 1.7–7.7)
Neutrophils Relative %: 78 %
Platelets: 188 10*3/uL (ref 150–400)
RBC: 4.79 MIL/uL (ref 3.87–5.11)
RDW: 17 % — ABNORMAL HIGH (ref 11.5–15.5)
WBC: 7.3 10*3/uL (ref 4.0–10.5)
nRBC: 0 % (ref 0.0–0.2)

## 2019-07-23 LAB — BASIC METABOLIC PANEL
Anion gap: 10 (ref 5–15)
BUN: 24 mg/dL — ABNORMAL HIGH (ref 8–23)
CO2: 21 mmol/L — ABNORMAL LOW (ref 22–32)
Calcium: 8.2 mg/dL — ABNORMAL LOW (ref 8.9–10.3)
Chloride: 103 mmol/L (ref 98–111)
Creatinine, Ser: 1.16 mg/dL — ABNORMAL HIGH (ref 0.44–1.00)
GFR calc Af Amer: 53 mL/min — ABNORMAL LOW (ref >60)
GFR calc non Af Amer: 46 mL/min — ABNORMAL LOW (ref >60)
Glucose, Bld: 92 mg/dL (ref 70–99)
Potassium: 3.1 mmol/L — ABNORMAL LOW (ref 3.5–5.1)
Sodium: 134 mmol/L — ABNORMAL LOW (ref 135–145)

## 2019-07-23 LAB — HEPATIC FUNCTION PANEL
ALT: 320 U/L — ABNORMAL HIGH (ref 0–44)
AST: 841 U/L — ABNORMAL HIGH (ref 15–41)
Albumin: 2 g/dL — ABNORMAL LOW (ref 3.5–5.0)
Alkaline Phosphatase: 1064 U/L — ABNORMAL HIGH (ref 38–126)
Bilirubin, Direct: 22.6 mg/dL — ABNORMAL HIGH (ref 0.0–0.2)
Total Bilirubin: 20.2 mg/dL (ref 0.3–1.2)
Total Protein: 5 g/dL — ABNORMAL LOW (ref 6.5–8.1)

## 2019-07-23 LAB — CANCER ANTIGEN 27.29: CA 27.29: 144.3 U/mL — ABNORMAL HIGH (ref 0.0–38.6)

## 2019-07-23 LAB — PROTIME-INR
INR: 1.6 — ABNORMAL HIGH (ref 0.8–1.2)
Prothrombin Time: 18 seconds — ABNORMAL HIGH (ref 11.4–15.2)

## 2019-07-23 LAB — SURGICAL PATHOLOGY

## 2019-07-23 SURGERY — ERCP, WITH INTERVENTION IF INDICATED
Anesthesia: General

## 2019-07-23 MED ORDER — EPHEDRINE SULFATE-NACL 50-0.9 MG/10ML-% IV SOSY
PREFILLED_SYRINGE | INTRAVENOUS | Status: DC | PRN
  Administered 2019-07-23 (×2): 10 mg via INTRAVENOUS

## 2019-07-23 MED ORDER — PHENYLEPHRINE 40 MCG/ML (10ML) SYRINGE FOR IV PUSH (FOR BLOOD PRESSURE SUPPORT)
PREFILLED_SYRINGE | INTRAVENOUS | Status: DC | PRN
  Administered 2019-07-23: 160 ug via INTRAVENOUS
  Administered 2019-07-23 (×3): 120 ug via INTRAVENOUS

## 2019-07-23 MED ORDER — INDOMETHACIN 50 MG RE SUPP
RECTAL | Status: DC | PRN
  Administered 2019-07-23: 100 mg via RECTAL

## 2019-07-23 MED ORDER — FENTANYL CITRATE (PF) 100 MCG/2ML IJ SOLN
INTRAMUSCULAR | Status: AC
  Filled 2019-07-23: qty 2

## 2019-07-23 MED ORDER — PROPOFOL 10 MG/ML IV BOLUS
INTRAVENOUS | Status: AC
  Filled 2019-07-23: qty 20

## 2019-07-23 MED ORDER — METHYLPREDNISOLONE SODIUM SUCC 125 MG IJ SOLR
125.0000 mg | Freq: Once | INTRAMUSCULAR | Status: AC
  Administered 2019-07-23: 09:00:00 125 mg via INTRAVENOUS
  Filled 2019-07-23: qty 2

## 2019-07-23 MED ORDER — LIDOCAINE 2% (20 MG/ML) 5 ML SYRINGE
INTRAMUSCULAR | Status: DC | PRN
  Administered 2019-07-23: 50 mg via INTRAVENOUS

## 2019-07-23 MED ORDER — SODIUM CHLORIDE 0.9 % IV SOLN
INTRAVENOUS | Status: DC | PRN
  Administered 2019-07-23: 125 mL

## 2019-07-23 MED ORDER — ROCURONIUM BROMIDE 10 MG/ML (PF) SYRINGE
PREFILLED_SYRINGE | INTRAVENOUS | Status: DC | PRN
  Administered 2019-07-23: 10 mg via INTRAVENOUS
  Administered 2019-07-23: 70 mg via INTRAVENOUS

## 2019-07-23 MED ORDER — GLUCAGON HCL RDNA (DIAGNOSTIC) 1 MG IJ SOLR
INTRAMUSCULAR | Status: DC | PRN
Start: 2019-07-23 — End: 2019-07-23
  Administered 2019-07-23 (×6): .25 mg via INTRAVENOUS

## 2019-07-23 MED ORDER — PROPOFOL 10 MG/ML IV BOLUS
INTRAVENOUS | Status: DC | PRN
  Administered 2019-07-23: 130 mg via INTRAVENOUS

## 2019-07-23 MED ORDER — GLUCAGON HCL RDNA (DIAGNOSTIC) 1 MG IJ SOLR
INTRAMUSCULAR | Status: AC
  Filled 2019-07-23: qty 1

## 2019-07-23 MED ORDER — ONDANSETRON HCL 4 MG/2ML IJ SOLN
INTRAMUSCULAR | Status: DC | PRN
  Administered 2019-07-23: 4 mg via INTRAVENOUS

## 2019-07-23 MED ORDER — PHENYLEPHRINE HCL-NACL 10-0.9 MG/250ML-% IV SOLN
INTRAVENOUS | Status: DC | PRN
  Administered 2019-07-23: 50 ug/min via INTRAVENOUS

## 2019-07-23 MED ORDER — POTASSIUM CHLORIDE CRYS ER 20 MEQ PO TBCR: 40.0000 meq | EXTENDED_RELEASE_TABLET | ORAL | Status: AC

## 2019-07-23 MED ORDER — SUGAMMADEX SODIUM 200 MG/2ML IV SOLN
INTRAVENOUS | Status: DC | PRN
  Administered 2019-07-23: 175 mg via INTRAVENOUS

## 2019-07-23 MED ORDER — FENTANYL CITRATE (PF) 100 MCG/2ML IJ SOLN
INTRAMUSCULAR | Status: DC | PRN
  Administered 2019-07-23: 50 ug via INTRAVENOUS

## 2019-07-23 MED ORDER — INDOMETHACIN 50 MG RE SUPP
RECTAL | Status: AC
  Filled 2019-07-23: qty 2

## 2019-07-23 NOTE — Progress Notes (Signed)
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  74 year old female who presented with hypotension after a ultrasound-guided liver biopsy by IR. Patient does have the significant past medical history for hypertension, dyslipidemia, breast/parotid cancer and GERD. Patient reported about 7 days of skin discoloration, associated with nausea, vomiting, decreased appetite and generalized weakness. July 05, 2019 she was diagnosed with liver lesions per CT scan of the chest which was done prior to follow-up with cancer surveillance. Patient was referred for IR guided liver biopsy.Her preoperative studies showed hyponatremia, hypokalemia, hyperbilirubinemia and elevated liver enzymes. After ultrasound-guided biopsy,her blood pressure was 66/36, with IV fluids improved to 101/55, heart rate 73, respiratory rate 16, temperature 97.9, oxygen saturation 99%,her lungs were clear to auscultation bilaterally, heart S1-S2 present rhythmic, her abdomen was soft and nontender, no lower extremity edema. MRCP with widespread metastatic disease to the liver. Dominant index lesion in the right hepatic lobe 4.0 x 3.0 cm. Intrahepatic biliary dilatation with central truncation of the biliary tree.  Patient has been placed on intravenous fluids and close hemodynamic monitoring.   Liver biopsy positive for metastatic breast cancer.   ERCP with severe biliary stricturing, at the middle and upper third of the main bile duct, dilation of the right and left intrahepatic ducts. Now sp biliary sphincterectomy/ sphincterectoplasty. Plastic biliary stents were placed at the right, left hepatic ducts and at the ventral pancreatic duct.   Assessment & Plan:   Principal Problem:   Obstructive jaundice Active Problems:    Malignant neoplasm of lower-inner quadrant of right breast of female, estrogen receptor positive (HCC)   Cancer of parotid gland (HCC)   Nodule on liver   Hypokalemia   Hyponatremia   Transaminitis   Hypotension   Elevated bilirubin   1. Obstructive jaundice (direct hyperbilirubinemia) with high risk for cholangitis.  SP sphincterectomy and sphincteroplasty, stents at the right and left hepatic ducts, stent at the ventral pancreatic duct. Thid am AST 841, ALT 320, AlK P 1,064, T Bil 20.1, direct bilirubin 22.6.   Diet advanced to clears post procedure, continue with IV antibiotic therapy with Zosyn will need 5 more days of antibiotic therapy. Monitor signs of post procedure pancreatitis. Continue as needed analgesics (morphine and oxycodone) and antiacids. Hold on enoxaparin for 24 H post procedure due to risk of bleeding.   Note patient had one dose of 125 mg Methylprednisolone.   Case discussed with Dr. Rush Landmark, from GI.   2. HTN/ dyslipidemia. (Holding telemisartan and hctz/ statin therapy). Blood pressure 132/72, will continue close monitoring.   Will hold on IV fluids for now.   3. AKI with Hyponatremia and hypokalemia. Non gap metabolic acidosis. Renal function with serum cr down to 1,16 with K at 3,1 and serum bicarbonate at 21.   Positive hand edema and bilateral lower extremities trace edema. Will hold on IV fluids and continue K correction with Kcl, check renal function in am, with electrolytes and Mg.   4. Hx of breast cancer with diffuse liver metastasis. Continue with anastrazole. Continue to follow up with Dr. Burr Medico from oncology as outpatient.  5. GERD. Continue with famotidine.   Status is: Inpatient  Remains inpatient appropriate because:IV treatments appropriate due to intensity of illness or inability to take PO   Dispo: The patient is from: Home  Anticipated d/c is to: Home              Anticipated d/c date is: 2 days               Patient currently is not medically stable to d/c.        DVT prophylaxis: scd   Code Status:   full  Family Communication:  No family at the bedside      Nutrition Status: Nutrition Problem: Inadequate oral intake Etiology: nausea, vomiting, decreased appetite Signs/Symptoms: per patient/family report(progressive over the past month) Interventions: Boost Breeze    Consultants:   GI   Procedures:   ERCP, stent placement bilateral hepatic ducts and pancreatic duct 05/07.   Antimicrobials:   IV Zosyn     Subjective: Patient post procedure, no nausea or vomiting, she has been NPO. No dyspnea or chest pain. Very weak and deconditioned.   Objective: Vitals:   07/23/19 1050 07/23/19 1100 07/23/19 1110 07/23/19 1120  BP: 122/68 111/78 132/73 132/72  Pulse: 80 86 93 89  Resp: 19 (!) 22 (!) 21 (!) 22  Temp:      TempSrc:      SpO2: 100% 99% 91% 95%  Weight:      Height:        Intake/Output Summary (Last 24 hours) at 07/23/2019 1140 Last data filed at 07/23/2019 1042 Gross per 24 hour  Intake 4057.13 ml  Output 1700 ml  Net 2357.13 ml   Filed Weights   07/20/19 2051 07/23/19 0653  Weight: 77.1 kg 77.1 kg    Examination:   General: Not in pain or dyspnea, deconditioned  Neurology: Awake and alert, non focal  E ENT: positive pallor and icterus, oral mucosa moist Cardiovascular: No JVD. S1-S2 present, rhythmic, no gallops, rubs, or murmurs. No lower extremity edema. Pulmonary: positive breath sounds bilaterally, adequate air movement, no wheezing, rhonchi or rales. Gastrointestinal. Abdomen with no organomegaly, non tender, no rebound or guarding Skin. No rashes Musculoskeletal: no joint deformities     Data Reviewed: I have personally reviewed following labs and imaging studies  CBC: Recent Labs  Lab 07/20/19 1130 07/20/19 1958 07/22/19 0451 07/23/19 0423  WBC 8.8 7.8 7.6 7.3  NEUTROABS 7.4  --  5.9 5.7  HGB 15.1* 13.8 14.5 14.7  HCT 42.6 39.3  41.1 43.0  MCV 89.3 89.1 88.4 89.8  PLT 207 185 178 810   Basic Metabolic Panel: Recent Labs  Lab 07/20/19 1130 07/20/19 1958 07/21/19 0437 07/22/19 0451 07/23/19 0423  NA 129*  --  132* 134* 134*  K 2.6*  --  3.0* 3.9 3.1*  CL 87*  --  99 101 103  CO2 28  --  23 22 21*  GLUCOSE 135*  --  95 85 92  BUN 35*  --  37* 33* 24*  CREATININE 1.31* 1.38* 1.35* 1.44* 1.16*  CALCIUM 8.5*  --  7.3* 7.7* 8.2*  MG  --   --  2.5* 2.5*  --    GFR: Estimated Creatinine Clearance: 43 mL/min (A) (by C-G formula based on SCr of 1.16 mg/dL (H)). Liver Function Tests: Recent Labs  Lab 07/20/19 1130 07/20/19 1511 07/21/19 0437 07/22/19 0451 07/23/19 0423  AST 1,193* 1,026* 929* 972* 841*  ALT 462* 378* 320* 331* 320*  ALKPHOS 1,251* 1,054* 978* 1,108* 1,064*  BILITOT 22.5* 18.8* 16.9* 19.3* 20.2*  PROT 6.7 5.7* 5.3* 5.3* 5.0*  ALBUMIN 2.8* 2.3* 2.4* 2.2* 2.0*   Recent Labs  Lab 07/20/19 1511  LIPASE 143*   No results for input(s): AMMONIA in the last 168 hours. Coagulation Profile: Recent Labs  Lab 07/20/19 1130 07/21/19 0437 07/23/19 0423  INR 1.2 1.4* 1.6*   Cardiac Enzymes: No results for input(s): CKTOTAL, CKMB, CKMBINDEX, TROPONINI in the last 168 hours. BNP (last 3 results) No results for input(s): PROBNP in the last 8760 hours. HbA1C: Recent Labs    07/20/19 1958  HGBA1C 5.9*   CBG: No results for input(s): GLUCAP in the last 168 hours. Lipid Profile: No results for input(s): CHOL, HDL, LDLCALC, TRIG, CHOLHDL, LDLDIRECT in the last 72 hours. Thyroid Function Tests: Recent Labs    07/20/19 1958  TSH 1.232   Anemia Panel: No results for input(s): VITAMINB12, FOLATE, FERRITIN, TIBC, IRON, RETICCTPCT in the last 72 hours.    Radiology Studies: I have reviewed all of the imaging during this hospital visit personally     Scheduled Meds: . anastrozole  1 mg Oral Daily  . famotidine  20 mg Oral BID  . feeding supplement  1 Container Oral TID BM    Continuous Infusions: . lactated ringers 100 mL/hr at 07/22/19 1501  . piperacillin-tazobactam (ZOSYN)  IV 3.375 g (07/23/19 0227)     LOS: 3 days        Gonzalo Waymire Gerome Apley, MD

## 2019-07-23 NOTE — Anesthesia Postprocedure Evaluation (Signed)
Anesthesia Post Note  Patient: [name]  Procedure(s) Performed: ENDOSCOPIC RETROGRADE CHOLANGIOPANCREATOGRAPHY (ERCP) (N/A ) SPHINCTEROTOMY BILIARY DILATION BILIARY STENT PLACEMENT (N/A ) PANCREATIC STENT PLACEMENT     Patient location during evaluation: PACU Anesthesia Type: General Level of consciousness: sedated and patient cooperative Pain management: pain level controlled Vital Signs Assessment: post-procedure vital signs reviewed and stable Respiratory status: spontaneous breathing Cardiovascular status: stable Anesthetic complications: no    Last Vitals:  Vitals:   07/23/19 1120 07/23/19 1358  BP: 132/72 136/73  Pulse: 89 85  Resp: (!) 22 18  Temp:  36.4 C  SpO2: 95% 96%    Last Pain:  Vitals:   07/23/19 1358  TempSrc: Oral  PainSc:                  Nolon Nations

## 2019-07-23 NOTE — Telephone Encounter (Signed)
Scheduled apt per 5/7 sch message - unable to reach pt - left message with appt date and time

## 2019-07-23 NOTE — Telephone Encounter (Signed)
-----   Message from Irving Copas., MD sent at 07/23/2019 11:38 AM EDT ----- Chong Sicilian, Patient needs KUB in 2-weeks for pancreatic stent followup. LFTs in 2-weeks OK as well. Thanks. GM

## 2019-07-23 NOTE — Interval H&P Note (Signed)
As patient procedure to begin this AM after intubation being performed. We proceeded with our final timeout checkpoint. Patient has a listed allergy to IV Contrast and she has had hives but no ananphylaxis. She did not get Premedication for her procedure today. We will plan to give patient 125 mg Solumedrol and 25 mg IV Benadryl in effort of decreasing risk of potential for anaphylaxis prior to starting her procedure.  Justice Britain, MD [facility] # [id number]

## 2019-07-23 NOTE — Progress Notes (Signed)
NT was assisting patient to bathroom with use of walker, when coming back to bed, patient became weak, "legs gave out" and was assisted to the floor by NT.  No injuries noted, patient assisted back to bed x 3 assist, MD informed

## 2019-07-23 NOTE — Transfer of Care (Signed)
Immediate Anesthesia Transfer of Care Note  Patient: [name]  Procedure(s) Performed: ENDOSCOPIC RETROGRADE CHOLANGIOPANCREATOGRAPHY (ERCP) (N/A )  Patient Location: PACU  Anesthesia Type:General  Level of Consciousness: awake  Airway & Oxygen Therapy: Patient Spontanous Breathing and Patient connected to face mask oxygen  Post-op Assessment: Report given to RN, Post -op Vital signs reviewed and stable and Patient moving all extremities X 4  Post vital signs: Reviewed and stable  Last Vitals:  Vitals Value Taken Time  BP 104/65 07/23/19 1040  Temp    Pulse 87 07/23/19 1041  Resp 18 07/23/19 1041  SpO2 100 % 07/23/19 1041  Vitals shown include unvalidated device data.  Last Pain:  Vitals:   07/23/19 0653  TempSrc: Oral  PainSc: 0-No pain         Complications: No apparent anesthesia complications

## 2019-07-23 NOTE — Progress Notes (Addendum)
[name]   DOB:1945-12-04   KW#:[id number]   JME#:[id number]  Oncology follow up   Subjective: The patient just returned from her ERCP.  Tolerated her procedure well.  She reports feeling a little sleepy.  She has no abdominal pain, nausea, vomiting.  Still jaundiced.  Husband is at the bedside.  Objective:  Vitals:   07/23/19 1110 07/23/19 1120  BP: 132/73 132/72  Pulse: 93 89  Resp: (!) 21 (!) 22  Temp:    SpO2: 91% 95%    Body mass index is 28.29 kg/m.  Intake/Output Summary (Last 24 hours) at 07/23/2019 1252 Last data filed at 07/23/2019 1042 Gross per 24 hour  Intake 4057.13 ml  Output 1700 ml  Net 2357.13 ml     Sclerae icteric  Oropharynx clear  No peripheral adenopathy  Lungs clear -- no rales or rhonchi  Heart regular rate and rhythm  Abdomen soft nontender   MSK no focal spinal tenderness, no peripheral edema  Neuro nonfocal    CBG (last 3)  No results for input(s): GLUCAP in the last 72 hours.   Labs:  Urine Studies No results for input(s): UHGB, CRYS in the last 72 hours.  Invalid input(s): UACOL, UAPR, USPG, UPH, UTP, UGL, UKET, UBIL, UNIT, UROB, ULEU, UEPI, UWBC, URBC, UBAC, CAST, [place], [place]  Basic Metabolic Panel: Recent Labs  Lab 07/20/19 1130 07/20/19 1130 07/20/19 1958 07/21/19 0437 07/21/19 0437 07/22/19 0451 07/23/19 0423  NA 129*  --   --  132*  --  134* 134*  K 2.6*   < >  --  3.0*   < > 3.9 3.1*  CL 87*  --   --  99  --  101 103  CO2 28  --   --  23  --  22 21*  GLUCOSE 135*  --   --  95  --  85 92  BUN 35*  --   --  37*  --  33* 24*  CREATININE 1.31*  --  1.38* 1.35*  --  1.44* 1.16*  CALCIUM 8.5*  --   --  7.3*  --  7.7* 8.2*  MG  --   --   --  2.5*  --  2.5*  --    < > = values in this interval not displayed.   GFR Estimated Creatinine Clearance: 43 mL/min (A) (by C-G formula based on SCr of 1.16 mg/dL (H)). Liver Function Tests: Recent Labs  Lab 07/20/19 1130 07/20/19 1511 07/21/19 0437 07/22/19 0451 07/23/19 0423   AST 1,193* 1,026* 929* 972* 841*  ALT 462* 378* 320* 331* 320*  ALKPHOS 1,251* 1,054* 978* 1,108* 1,064*  BILITOT 22.5* 18.8* 16.9* 19.3* 20.2*  PROT 6.7 5.7* 5.3* 5.3* 5.0*  ALBUMIN 2.8* 2.3* 2.4* 2.2* 2.0*   Recent Labs  Lab 07/20/19 1511  LIPASE 143*   No results for input(s): AMMONIA in the last 168 hours. Coagulation profile Recent Labs  Lab 07/20/19 1130 07/21/19 0437 07/23/19 0423  INR 1.2 1.4* 1.6*    CBC: Recent Labs  Lab 07/20/19 1130 07/20/19 1958 07/22/19 0451 07/23/19 0423  WBC 8.8 7.8 7.6 7.3  NEUTROABS 7.4  --  5.9 5.7  HGB 15.1* 13.8 14.5 14.7  HCT 42.6 39.3 41.1 43.0  MCV 89.3 89.1 88.4 89.8  PLT 207 185 178 188   Cardiac Enzymes: No results for input(s): CKTOTAL, CKMB, CKMBINDEX, TROPONINI in the last 168 hours. BNP: Invalid input(s): POCBNP CBG: No results for input(s): GLUCAP in the  last 168 hours. D-Dimer No results for input(s): DDIMER in the last 72 hours. Hgb A1c Recent Labs    07/20/19 1958  HGBA1C 5.9*   Lipid Profile No results for input(s): CHOL, HDL, LDLCALC, TRIG, CHOLHDL, LDLDIRECT in the last 72 hours. Thyroid function studies Recent Labs    07/20/19 1958  TSH 1.232   Anemia work up No results for input(s): VITAMINB12, FOLATE, FERRITIN, TIBC, IRON, RETICCTPCT in the last 72 hours. Microbiology Recent Results (from the past 240 hour(s))  Respiratory Panel by RT PCR (Flu A&B, Covid) - Nasopharyngeal Swab     Status: None   Collection Time: 07/20/19  3:21 PM   Specimen: Nasopharyngeal Swab  Result Value Ref Range Status   SARS Coronavirus 2 by RT PCR NEGATIVE NEGATIVE Final    Comment: (NOTE) SARS-CoV-2 target nucleic acids are NOT DETECTED. The SARS-CoV-2 RNA is generally detectable in upper respiratoy specimens during the acute phase of infection. The lowest concentration of SARS-CoV-2 viral copies this assay can detect is 131 copies/mL. A negative result does not preclude SARS-Cov-2 infection and should not be  used as the sole basis for treatment or other patient management decisions. A negative result may occur with  improper specimen collection/handling, submission of specimen other than nasopharyngeal swab, presence of viral mutation(s) within the areas targeted by this assay, and inadequate number of viral copies (<131 copies/mL). A negative result must be combined with clinical observations, patient history, and epidemiological information. The expected result is Negative. Fact Sheet for Patients:  PinkCheek.be Fact Sheet for Healthcare Providers:  GravelBags.it This test is not yet ap proved or cleared by the [place] FDA and  has been authorized for detection and/or diagnosis of SARS-CoV-2 by FDA under an Emergency Use Authorization (EUA). This EUA will remain  in effect (meaning this test can be used) for the duration of the COVID-19 declaration under Section 564(b)(1) of the Act, 21 U.S.C. section 360bbb-3(b)(1), unless the authorization is terminated or revoked sooner.    Influenza A by PCR NEGATIVE NEGATIVE Final   Influenza B by PCR NEGATIVE NEGATIVE Final    Comment: (NOTE) The Xpert Xpress SARS-CoV-2/FLU/RSV assay is intended as an aid in  the diagnosis of influenza from Nasopharyngeal swab specimens and  should not be used as a sole basis for treatment. Nasal washings and  aspirates are unacceptable for Xpert Xpress SARS-CoV-2/FLU/RSV  testing. Fact Sheet for Patients: PinkCheek.be Fact Sheet for Healthcare Providers: GravelBags.it This test is not yet approved or cleared by the [place] FDA and  has been authorized for detection and/or diagnosis of SARS-CoV-2 by  FDA under an Emergency Use Authorization (EUA). This EUA will remain  in effect (meaning this test can be used) for the duration of the  Covid-19 declaration under Section 564(b)(1) of the  Act, 21  U.S.C. section 360bbb-3(b)(1), unless the authorization is  terminated or revoked. Performed at [facility], [place] [address] [place], [place] [address]       Studies:  DG ERCP BILIARY & PANCREATIC DUCTS  Result Date: 07/23/2019 CLINICAL DATA:  74 year old female with a history biliary abnormality EXAM: ERCP TECHNIQUE: Multiple spot images obtained with the fluoroscopic device and submitted for interpretation post-procedure. FLUOROSCOPY TIME:  Fluoroscopy Time: 15 minutes 39 seconds COMPARISON:  MR 07/20/2019 FINDINGS: Limited intraoperative fluoroscopic spot images during ERCP. Initial image demonstrates endoscope projecting over the upper abdomen. Subsequently there is cannulation of the ampulla of the extra attic biliary system and retrograde infusion of contrast partially  opacifying the extrahepatic biliary system. Deployment of a balloon catheter. Final image demonstrates parallel plastic biliary stents. IMPRESSION: Limited images of ERCP, with evidence of balloon angioplasty and parallel plastic biliary stenting. Please refer to the dictated operative report for full details of intraoperative findings and procedure. Electronically Signed   By: Corrie Mckusick D.O.   On: 07/23/2019 11:23    Assessment: 74 y.o. Caucasian female, with past medical history of breast cancer and parotid gland carcinoma  1. 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  AKI with hyponatremia and hypokalemia 3.  History of stage I right breast cancer in 2019, on adjuvant anastrozole 4.  History of parotid gland carcinoma in 2020, status post surgery and radiation 5. HTN   Plan:  -Biopsy results have been discussed with the patient and her husband.  They are aware that we are awaiting additional information including the breast prognostic profile.  Once we have this information, we can discuss treatment options. -Status post ERCP with  stent placement x3. -Monitor liver function closely. -We will arrange for outpatient follow-up to discuss treatment options.  Mikey Bussing, NP 07/23/2019    Addendum  I have seen the patient, examined her. I agree with the assessment and and plan and have edited the notes.   [name] underwent ERCP and stent placement this morning, she is little drowsy but able to answer questions appropriately.  Her husband [name] was at the bedside.  I discussed her liver biopsy results with patient and her husband, which is consistent with metastatic breast cancer.  ER was weakly positive at this time, HER-2 still pending.  Clinically her cancer is more aggressive, behaves like a ER negative disease.  I discussed the overall poor prognosis, and the incurable nature of her metastatic cancer.  I discussed treatment option, likely will be chemotherapy.  I will also check PD-L1 expression in her tumor, to see if she is a candidate for immunotherapy.  We also discussed that she needs to be little stronger to be able to take chemo, we discussed the option of supportive care alone if she does not recover well enough to take chemo.  I plan to see her back in my office in 10 to 14 days for repeat lab and make a decision about chemotherapy.  Truitt Merle  07/23/2019

## 2019-07-23 NOTE — Anesthesia Procedure Notes (Signed)
Procedure Name: Intubation Date/Time: 07/23/2019 7:58 AM Performed by: Niel Hummer, CRNA Pre-anesthesia Checklist: Patient identified, Emergency Drugs available, Suction available and Patient being monitored Patient Re-evaluated:Patient Re-evaluated prior to induction Oxygen Delivery Method: Circle system utilized Preoxygenation: Pre-oxygenation with 100% oxygen Induction Type: IV induction Ventilation: Mask ventilation without difficulty Laryngoscope Size: Mac and 4 Grade View: Grade II Tube type: Oral Tube size: 7.0 mm Number of attempts: 1 Airway Equipment and Method: Stylet Placement Confirmation: ETT inserted through vocal cords under direct vision,  positive ETCO2 and breath sounds checked- equal and bilateral Secured at: 22 cm Tube secured with: Tape Dental Injury: Teeth and Oropharynx as per pre-operative assessment

## 2019-07-23 NOTE — Interval H&P Note (Signed)
History and Physical Interval Note:  07/23/2019 7:48 AM  [name]  has presented today for surgery, with the diagnosis of biliary obstruction, jaundice.  The various methods of treatment have been discussed with the patient and family. After consideration of risks, benefits and other options for treatment, the patient has consented to  Procedure(s): ENDOSCOPIC RETROGRADE CHOLANGIOPANCREATOGRAPHY (ERCP) (N/A) as a surgical intervention.  The patient's history has been reviewed, patient examined, no change in status, stable for surgery.  I have reviewed the patient's chart and labs.  Questions were answered to the patient's satisfaction.    The risks of an ERCP were discussed at length, including but not limited to the risk of perforation, bleeding, abdominal pain, post-ERCP pancreatitis (while usually mild can be severe and even life threatening).   [employer]

## 2019-07-23 NOTE — Op Note (Signed)
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 Estimated Blood Loss:     Estimated blood loss was minimal. Procedure:                Pre-Anesthesia Assessment:                           - Prior to the procedure, a History and Physical                            was performed, and patient medications and                            allergies were reviewed. The patient's tolerance of                            previous anesthesia was also reviewed. The risks                            and benefits of the procedure and the sedation  options and risks were discussed with the patient.                            All questions were answered, and informed consent                            was obtained. Prior Anticoagulants: The patient has                            taken heparin, last dose was 1 day prior to                            procedure. ASA Grade Assessment: III - A patient                            with severe  systemic disease. After reviewing the                            risks and benefits, the patient was deemed in                            satisfactory condition to undergo the procedure.                           After obtaining informed consent, the scope was                            passed under direct vision. Throughout the                            procedure, the patient's blood pressure, pulse, and                            oxygen saturations were monitored continuously. The                            TJF-Q180V ([id number]) Olympus Duodenoscope was                            introduced through the mouth, and used to inject                            contrast into and used to inject contrast into the                            bile duct and ventral pancreatic duct. The ERCP was                            extremely difficult due to challenging cannulation.                            Successful completion of the procedure was aided by  performing the maneuvers documented (below) in this                            report. The patient tolerated the procedure. Scope In: Scope Out: Findings:      The scout film was normal.      The esophagus was successfully intubated under direct vision without       detailed examination of the pharynx, larynx, and associated structures,       and upper GI tract. The major papilla was located entirely within a       diverticulum and due to overlying folds in the region, could not be       visualized completely without pulling the ampulla out with the tome and       then seeing it go back inside.      The bile duct could not be cannulated with the short-nosed traction       sphincterotome on initial attempt in what was felt to be the true       biliary access. This led to placement of the wire within the presumed       pancreatic duct. Decision was made to pursue a double-wire approach. The       wire was left within the duct. A  second short 0.035 inch Soft Jagwire       was passed after a few attempts into what looked to be more likely the       pancreatic duct since it was in a more angled position. In essence, I       had already been in the biliary duct initially. I left the wire in the       pancreatic duct.      I then switched back to the biliary wire which was on the left biliary       tree. The short-nosed traction sphincterotome was passed over the       guidewire and the bile duct was then deeply cannulated. Contrast was       injected. I personally interpreted the bile duct images. There was       appropriate flow of contrast through the ducts. Image quality was       adequate. Contrast extended to the hepatic ducts. Opacification of the       entire biliary tree except for the cystic duct and gallbladder was       successful. The middle third of the main bile duct and upper third of       the main bile duct contained a single severe stenosis spanning the       length of these region at least 50 mm in length. The left and right       hepatic ducts with secondary or tertiary branches of the intrahepatic       ducts (Bismuth IV) contained severe stenoses 20 mm in length. The right       and left intrahepatic branches (but not the right or left hepatic ducts)       was moderately dilated, secondary to aforementioned Bismuth stricturing.       I didn't have great views to do a true sphincterotomy, but pursued a 4       mm biliary sphincterotomy as best I could visualize since the orifice       was completely in the diverticulum with the monofilament short nose  sphincterotome using ERBE electrocautery. There was no       post-sphincterotomy bleeding. A short Revolution 0.025 inch Jagwire was       passed into the right biliary tree. Dilation of the common bile duct       orifice distally as a sphincteroplasty with a 6 mm Hurricane balloon       dilator was successful. Dilation of the entire main bile  duct, the left       main hepatic duct and the right main hepatic duct with a 4 mm balloon       and then a 6 mm balloon dilator was successful in effort of improving       chance at biliary stenting. Due to the most significant dilation being       on the left system, I started here, one 7 Fr by 15 cm transpapillary       plastic biliary stent with a single external flap and a single internal       flap was placed into the left hepatic duct. Bile flowed through the       stent. The stent was in good position. Then I placed one 7 Fr by 12 cm       transpapillary plastic biliary stent with a single external flap and a       single internal flap was placed into the right hepatic duct. Bile flowed       through the stent. The stent was in good position.      Even though I had only been in the pancreatic duct on 2 occasions, I       wanted to decrease risk of PEP. One 4 Fr by 7 cm temporary plastic       pancreatic stent with a single external pigtail was placed into the       ventral pancreatic duct. The stent was in good position. A pancreatogram       was not performed.      The duodenoscope was withdrawn from the patient. Impression:               - The major papilla was located entirely within a                            diverticulum.                           - Severe biliary stricturing was noted in the                            middle third of the main bile duct and upper third                            of the main bile duct and at the birfucation as a                            Bismuth IV alignment.                           - The right and left intrahepatic branches (but not  the right or left hepatic ducts) was moderately                            dilated, secondary this stricturing disease.                           - A small biliary sphincterotomy was performed and                            then a sphincteroplasty as I could not visualize                             the entire ampulla as it was hidden within the                            diverticulum.                           - Stricturing disease was dilated.                           - One plastic biliary stent was placed into the                            left hepatic duct.                           - One plastic biliary stent was placed into the                            right hepatic duct.                           - One temporary plastic pancreatic stent was placed                            into the ventral pancreatic duct. Moderate Sedation:      Not Applicable - Patient had care per Anesthesia. Recommendation:           - The patient will be observed post-procedure,                            until all discharge criteria are met.                           - Return patient to hospital ward for ongoing care.                           - Advance diet as tolerated.                           - Check liver enzymes (AST, ALT, alkaline                            phosphatase, bilirubin) in the morning.                           -  Watch for pancreatitis, bleeding, perforation,                            and cholangitis.                           - Observe patient's clinical course.                           - If fails this too soon, may need PBD placement                            bilaterally.                           - Continue scheduled antibiotics for at least a                            total of 5-days since hilar work was performed                            today.                           - Repeat ERCP in 3 months to exchange stents if                            patient does well to potentially permanent stenting.                           - The findings and recommendations were discussed                            with the patient.                           - The findings and recommendations were discussed                            with the patient's family.                            - The findings and recommendations were discussed                            with the referring physician. Procedure Code(s):        --- Professional ---                           [phone], Endoscopic retrograde                            cholangiopancreatography (ERCP); with placement of                            endoscopic stent into biliary or pancreatic duct,  including pre- and post-dilation and guide wire                            passage, when performed, including sphincterotomy,                            when performed, each stent                           [id number], 30, Endoscopic retrograde                            cholangiopancreatography (ERCP); with placement of                            endoscopic stent into biliary or pancreatic duct,                            including pre- and post-dilation and guide wire                            passage, when performed, including sphincterotomy,                            when performed, each stent                           [id number], 34, Endoscopic retrograde                            cholangiopancreatography (ERCP); with placement of                            endoscopic stent into biliary or pancreatic duct,                            including pre- and post-dilation and guide wire                            passage, when performed, including sphincterotomy,                            when performed, each stent Diagnosis Code(s):        --- Professional ---                           K83.1, Obstruction of bile duct                           R17, Unspecified jaundice                           R94.5, Abnormal results of liver function studies                           R93.2, Abnormal findings on diagnostic imaging of  liver and biliary tract CPT copyright 2019 [employer] Association. All rights reserved. The codes documented in this report are preliminary and upon  coder review may  be revised to meet current compliance requirements. Justice Britain, MD 07/23/2019 10:56:51 AM Number of Addenda: 0

## 2019-07-23 NOTE — Anesthesia Preprocedure Evaluation (Addendum)
Anesthesia Evaluation  Patient identified by MRN, date of birth, ID band Patient awake    Reviewed: Allergy & Precautions, NPO status , Patient's Chart, lab work & pertinent test results  History of Anesthesia Complications (+) PONV  Airway Mallampati: III  TM Distance: >3 FB Neck ROM: Full    Dental  (+) Teeth Intact, Dental Advisory Given   Pulmonary neg pulmonary ROS, former smoker,    Pulmonary exam normal breath sounds clear to auscultation       Cardiovascular hypertension, Pt. on medications Normal cardiovascular exam Rhythm:Regular Rate:Normal  HLD   Neuro/Psych negative neurological ROS  negative psych ROS   GI/Hepatic Neg liver ROS, GERD  Medicated and Controlled,  Endo/Other  negative endocrine ROS  Renal/GU Renal InsufficiencyRenal diseaseCr 1.16, K 3.1  negative genitourinary   Musculoskeletal  (+) Arthritis ,   Abdominal   Peds  Hematology negative hematology ROS (+)   Anesthesia Other Findings Liver lesions, elevated LFTs, obstructive jaundice c/f metastases in setting of h/o breast/parotid CA  Reproductive/Obstetrics                           Anesthesia Physical Anesthesia Plan  ASA: III  Anesthesia Plan: General   Post-op Pain Management:    Induction: Intravenous  PONV Risk Score and Plan: 4 or greater and Midazolam, Dexamethasone, Ondansetron and Treatment may vary due to age or medical condition  Airway Management Planned: Oral ETT  Additional Equipment:   Intra-op Plan:   Post-operative Plan: Extubation in OR  Informed Consent: I have reviewed the patients History and Physical, chart, labs and discussed the procedure including the risks, benefits and alternatives for the proposed anesthesia with the patient or authorized representative who has indicated his/her understanding and acceptance.     Dental advisory given  Plan Discussed with:  CRNA  Anesthesia Plan Comments:         Anesthesia Quick Evaluation

## 2019-07-23 NOTE — Discharge Instructions (Signed)
YOU HAD AN ENDOSCOPIC PROCEDURE TODAY: Refer to the procedure report and other information in the discharge instructions given to you for any specific questions about what was found during the examination. If this information does not answer your questions, please call [place] office at [phone] to clarify.   YOU SHOULD EXPECT: Some feelings of bloating in the abdomen. Passage of more gas than usual. Walking can help get rid of the air that was put into your GI tract during the procedure and reduce the bloating. If you had a lower endoscopy (such as a colonoscopy or flexible sigmoidoscopy) you may notice spotting of blood in your stool or on the toilet paper. Some abdominal soreness may be present for a day or two, also.  DIET: Your first meal following the procedure should be a light meal and then it is ok to progress to your normal diet. A half-sandwich or bowl of soup is an example of a good first meal. Heavy or fried foods are harder to digest and may make you feel nauseous or bloated. Drink plenty of fluids but you should avoid alcoholic beverages for 24 hours. If you had a esophageal dilation, please see attached instructions for diet.    ACTIVITY: Your care partner should take you home directly after the procedure. You should plan to take it easy, moving slowly for the rest of the day. You can resume normal activity the day after the procedure however YOU SHOULD NOT DRIVE, use power tools, machinery or perform tasks that involve climbing or major physical exertion for 24 hours (because of the sedation medicines used during the test).   SYMPTOMS TO REPORT IMMEDIATELY: A gastroenterologist can be reached at any hour. Please call [phone]  for any of the following symptoms:   Following upper endoscopy (EGD, EUS, ERCP, esophageal dilation) Vomiting of blood or coffee ground material  New, significant abdominal pain  New, significant chest pain or pain under the shoulder blades  Painful or  persistently difficult swallowing  New shortness of breath  Black, tarry-looking or red, bloody stools  FOLLOW UP:  If any biopsies were taken you will be contacted by phone or by letter within the next 1-3 weeks. Call [phone]  if you have not heard about the biopsies in 3 weeks.  Please also call with any specific questions about appointments or follow up tests.  

## 2019-07-24 DIAGNOSIS — K831 Obstruction of bile duct: Secondary | ICD-10-CM | POA: Diagnosis not present

## 2019-07-24 LAB — BASIC METABOLIC PANEL
Anion gap: 10 (ref 5–15)
BUN: 33 mg/dL — ABNORMAL HIGH (ref 8–23)
CO2: 21 mmol/L — ABNORMAL LOW (ref 22–32)
Calcium: 8.2 mg/dL — ABNORMAL LOW (ref 8.9–10.3)
Chloride: 104 mmol/L (ref 98–111)
Creatinine, Ser: 1.5 mg/dL — ABNORMAL HIGH (ref 0.44–1.00)
GFR calc Af Amer: 39 mL/min — ABNORMAL LOW (ref >60)
GFR calc non Af Amer: 34 mL/min — ABNORMAL LOW (ref >60)
Glucose, Bld: 140 mg/dL — ABNORMAL HIGH (ref 70–99)
Potassium: 3.9 mmol/L (ref 3.5–5.1)
Sodium: 135 mmol/L (ref 135–145)

## 2019-07-24 LAB — HEPATIC FUNCTION PANEL
ALT: 274 U/L — ABNORMAL HIGH (ref 0–44)
AST: 704 U/L — ABNORMAL HIGH (ref 15–41)
Albumin: 2 g/dL — ABNORMAL LOW (ref 3.5–5.0)
Alkaline Phosphatase: 981 U/L — ABNORMAL HIGH (ref 38–126)
Bilirubin, Direct: 12.1 mg/dL — ABNORMAL HIGH (ref 0.0–0.2)
Indirect Bilirubin: 7.8 mg/dL — ABNORMAL HIGH (ref 0.3–0.9)
Total Bilirubin: 18.4 mg/dL (ref 0.3–1.2)
Total Protein: 4.7 g/dL — ABNORMAL LOW (ref 6.5–8.1)

## 2019-07-24 LAB — CBC WITH DIFFERENTIAL/PLATELET
Abs Immature Granulocytes: 0.05 10*3/uL (ref 0.00–0.07)
Basophils Absolute: 0 10*3/uL (ref 0.0–0.1)
Basophils Relative: 0 %
Eosinophils Absolute: 0 10*3/uL (ref 0.0–0.5)
Eosinophils Relative: 0 %
HCT: 39.4 % (ref 36.0–46.0)
Hemoglobin: 13.6 g/dL (ref 12.0–15.0)
Immature Granulocytes: 1 %
Lymphocytes Relative: 4 %
Lymphs Abs: 0.3 10*3/uL — ABNORMAL LOW (ref 0.7–4.0)
MCH: 31.3 pg (ref 26.0–34.0)
MCHC: 34.5 g/dL (ref 30.0–36.0)
MCV: 90.8 fL (ref 80.0–100.0)
Monocytes Absolute: 0.5 10*3/uL (ref 0.1–1.0)
Monocytes Relative: 5 %
Neutro Abs: 8.1 10*3/uL — ABNORMAL HIGH (ref 1.7–7.7)
Neutrophils Relative %: 90 %
Platelets: 183 10*3/uL (ref 150–400)
RBC: 4.34 MIL/uL (ref 3.87–5.11)
RDW: 17.2 % — ABNORMAL HIGH (ref 11.5–15.5)
WBC: 8.9 10*3/uL (ref 4.0–10.5)
nRBC: 0 % (ref 0.0–0.2)

## 2019-07-24 LAB — LIPASE, BLOOD: Lipase: 105 U/L — ABNORMAL HIGH (ref 11–51)

## 2019-07-24 LAB — MAGNESIUM: Magnesium: 2.5 mg/dL — ABNORMAL HIGH (ref 1.7–2.4)

## 2019-07-24 MED ORDER — ENOXAPARIN SODIUM 40 MG/0.4ML ~~LOC~~ SOLN
40.0000 mg | SUBCUTANEOUS | Status: DC
  Administered 2019-07-25: 40 mg via SUBCUTANEOUS
  Filled 2019-07-24: qty 0.4

## 2019-07-24 MED ORDER — LACTATED RINGERS IV SOLN: INTRAVENOUS | Status: DC

## 2019-07-24 NOTE — Plan of Care (Signed)

## 2019-07-24 NOTE — Progress Notes (Signed)
[facility] Progress Note  CC: Obstructive jaundice, evaded LFTs, hepatic metastatic disease  Subjective: No nausea or vomiting following her ERCP. Tolerating diet advance, although she is intentionally cautious not to over do it. Thought that her jaundice may be slightly improved. No new complaints or concerns today. Continues to feel weak. Husband present at the bedside.   Objective:  Vital signs in last 24 hours: Temp:  [97.5 F (36.4 C)-98.7 F (37.1 C)] 97.7 F (36.5 C) (05/08 0930) Pulse Rate:  [82-109] 82 (05/08 0930) Resp:  [16-24] 16 (05/08 0930) BP: (111-136)/(63-87) 121/71 (05/08 0930) SpO2:  [91 %-99 %] 95 % (05/08 0930) Last BM Date: 07/21/19 General:   Alert,  Well-developed,    in NAD Eyes: Scleral icterus persists.  Conjunctiva pink. Abdomen: Soft, nondistended. No mass. No HSM. + BS x 4 quads. Neurologic:  Alert and  oriented x4;  grossly normal neurologically. Psych:  Alert and cooperative. Normal mood and affect. Skin: Jaundice.  Intake/Output from previous day: 05/07 0701 - 05/08 0700 In: 2168.5 [P.O.:360; I.V.:1650; IV Piggyback:158.5] Out: 700 [Urine:700] Intake/Output this shift: Total I/O In: 120 [P.O.:120] Out: 300 [Urine:300]  Lab Results: Recent Labs    07/22/19 0451 07/23/19 0423 07/24/19 0527  WBC 7.6 7.3 8.9  HGB 14.5 14.7 13.6  HCT 41.1 43.0 39.4  PLT 178 188 183   BMET Recent Labs    07/22/19 0451 07/23/19 0423 07/24/19 0527  NA 134* 134* 135  K 3.9 3.1* 3.9  CL 101 103 104  CO2 22 21* 21*  GLUCOSE 85 92 140*  BUN 33* 24* 33*  CREATININE 1.44* 1.16* 1.50*  CALCIUM 7.7* 8.2* 8.2*   LFT Recent Labs    07/24/19 0527  PROT 4.7*  ALBUMIN 2.0*  AST 704*  ALT 274*  ALKPHOS 981*  BILITOT 18.4*  BILIDIR PENDING  IBILI 7.8*   PT/INR Recent Labs    07/23/19 0423  LABPROT 18.0*  INR 1.6*   ERCP 07/24/19 Findings: - The major papilla was located entirely within a diverticulum. - Severe biliary  stricturing was noted in the middle third of the main bile duct and upper third of the main bile duct and at the birfucation as a Bismuth IV alignment. - The right and left intrahepatic branches (but not the right or left hepatic ducts) was moderately dilated, secondary this stricturing disease. - A small biliary sphincterotomy was performed and then a sphincteroplasty as I could not visualize the entire ampulla as it was hidden within the diverticulum. - Stricturing disease was dilated. - One plastic biliary stent was placed into the left hepatic duct. - One plastic biliary stent was placed into the right hepatic duct. - One temporary plastic pancreatic stent was placed into the ventral pancreatic duct. - The patient will be observed post-procedure, until all discharge criteria are met. Recommendations:  - Advance diet as tolerated. - Check liver enzymes (AST, ALT, alkaline phosphatase, bilirubin) in the morning. - Continue scheduled antibiotics for at least a total of 5-days since hilar work was performed today. - Repeat ERCP in 3 months to exchange stents if patient does well to potentially permanent stenting.    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 ERCP with small biliary sphincterotomy with stents placed for biliary strictures 07/23/19. Asymptomatic today. Liver enzymes have started to improve, including bilirubin. WBC remains normal.   Recommendations:    - Advance  diet as tolerated    - Complete 5 days of antibiotics    - KUB in 2 weeks for pancreatic stent follow-up ([facility] will arrange)    - Check LFTs in 2 weeks (patient may stop in [place] lab at [facility])    - Repeat ERCP in 3 months ([facility] will arrange)     - Famotidine 20 mg daily on discharge given recent reflux    - Antiemetics if needed  GI will move to stand-by. Please call the on-call gastroenterologist with any  additional questions or concerns during this hospitalization.      LOS: 4 days   Thornton Park  07/24/2019, 10:50 AM

## 2019-07-24 NOTE — Evaluation (Addendum)
Physical Therapy Evaluation Patient Details Name: [name] MRN: [id number] DOB: Sep 28, 1945 Today's Date: 07/24/2019   History of Present Illness  74 yo female admitted with diffuse liver mets, obstructive jaundice with high risk for cholangitis, AKI. S/P ERCP with biliary stent placement 5/7. New onset of weakness, proximal LE weakness > distal LE weakness. Hx of met breast cancer.  Clinical Impression  On eval today, pt required Mod assist +2 for safety/equipment for mobility. This is a significant change from just 3 days prior when pt was screened by PT (pt was ambulating in her room without assistance). Pt exhibits acute weakness of LEs, proximal weakness>distal weakness on MMT and with functional mobility. She was able to perform some pre-gait tasks at bedside on today with use of a RW for support. Pt presents with weakness, decreased activity tolerance, and impaired gait and balance. At baseline, pt was independent with mobility and ADLs. At this time, recommendation is for CIR consult to assess appropriateness for rehab stay. Will continue to follow, assess, and progress activity as tolerated. If CIR is not an option, may need to consider SNF placement unless pt's functional mobility and safety improve considerably.   Addendum: Pt may benefit from a neurology consult if hospitalist feels it is warranted.     Follow Up Recommendations CIR    Equipment Recommendations  Rolling walker with 5" wheels;Wheelchair (measurements PT);3in1 (PT)    Recommendations for Other Services Rehab consult;OT consult     Precautions / Restrictions Precautions Precautions: Fall Restrictions Weight Bearing Restrictions: No      Mobility  Bed Mobility Overal bed mobility: Needs Assistance Bed Mobility: Supine to Sit;Sit to Supine     Supine to sit: Min guard;HOB elevated Sit to supine: Min assist;HOB elevated   General bed mobility comments: Increased time and effort. Moderate reliance on  bedrail. Assist for bil LEs onto bed.  Transfers Overall transfer level: Needs assistance Equipment used: Rolling walker (2 wheeled) Transfers: Sit to/from Stand Sit to Stand: Mod assist;From elevated surface         General transfer comment: x 3. Assist to power up, stabilize, control descent. Pt was able to stand for ~15-20 seconds each time. Fatigues quickly. Cues for safety, hand placement.  Ambulation/Gait Ambulation/Gait assistance: Mod assist;+2 safety/equipment           General Gait Details: Pre-gait tasks: Marching in place 10 reps x 2. Forwards then backwards steps x 3. Seated rest break between tasks. Cues for safety, technique, posture. Pt exhibited difficulty clearing feet from floor.  Stairs            Wheelchair Mobility    Modified Rankin (Stroke Patients Only)       Balance Overall balance assessment: Needs assistance Sitting-balance support: Feet supported;Bilateral upper extremity supported Sitting balance-Leahy Scale: Good Sitting balance - Comments: Pt has some dificulty maintaining balance with ADL tasks (donning/doffing shoes) and MMT of LEs.   Standing balance support: Bilateral upper extremity supported Standing balance-Leahy Scale: Poor Standing balance comment: High fall risk                             Pertinent Vitals/Pain Pain Assessment: No/denies pain    Home Living Family/patient expects to be discharged to:: Private residence Living Arrangements: Spouse/significant other(husband with recent shoulder surgery) Available Help at Discharge: Family;Available 24 hours/day Type of Home: House Home Access: Stairs to enter Entrance Stairs-Rails: Left;Right;Can reach both Entrance Stairs-Number of Steps: 4-5  Home Layout: One level Home Equipment: Shower seat - built in      Prior Function Level of Independence: Independent               Hand Dominance        Extremity/Trunk Assessment   Upper Extremity  Assessment Upper Extremity Assessment: Defer to OT evaluation    Lower Extremity Assessment Lower Extremity Assessment: RLE deficits/detail;LLE deficits/detail RLE Deficits / Details: hip 3-/5, knee 4-/5, DF/PF 5/5. Edema. LLE Deficits / Details: hip 3-/5, knee 4-/5, DF/PF 5/5. Edema.    Cervical / Trunk Assessment Cervical / Trunk Assessment: Normal  Communication   Communication: No difficulties  Cognition Arousal/Alertness: Awake/alert Behavior During Therapy: WFL for tasks assessed/performed Overall Cognitive Status: Within Functional Limits for tasks assessed                                        General Comments      Exercises Other Exercises Other Exercises: Sit to Stand x 1 and 1/2. Min assist. Unable to get to full standing on 2nd rep 2* weakness/fatigue.   Assessment/Plan    PT Assessment Patient needs continued PT services  PT Problem List Decreased strength;Decreased mobility;Decreased activity tolerance;Decreased balance;Decreased knowledge of use of DME       PT Treatment Interventions DME instruction;Gait training;Therapeutic activities;Therapeutic exercise;Patient/family education;Balance training;Functional mobility training;Wheelchair mobility training    PT Goals (Current goals can be found in the Care Plan section)  Acute Rehab PT Goals Patient Stated Goal: to get strength back PT Goal Formulation: With patient/family Time For Goal Achievement: 08/07/19 Potential to Achieve Goals: Good    Frequency Min 3X/week   Barriers to discharge Decreased caregiver support(husband is recovering from shoulder surgery)      Co-evaluation               AM-PAC PT "6 Clicks" Mobility  Outcome Measure Help needed turning from your back to your side while in a flat bed without using bedrails?: A Little Help needed moving from lying on your back to sitting on the side of a flat bed without using bedrails?: A Little Help needed moving to  and from a bed to a chair (including a wheelchair)?: A Lot Help needed standing up from a chair using your arms (e.g., wheelchair or bedside chair)?: A Lot Help needed to walk in hospital room?: A Lot Help needed climbing 3-5 steps with a railing? : Total 6 Click Score: 13    End of Session Equipment Utilized During Treatment: Gait belt Activity Tolerance: Patient tolerated treatment well;Patient limited by fatigue Patient left: in bed;with call bell/phone within reach;with bed alarm set;with family/visitor present   PT Visit Diagnosis: Difficulty in walking, not elsewhere classified (R26.2);Muscle weakness (generalized) (M62.81)    Time: 6945-0388 PT Time Calculation (min) (ACUTE ONLY): 31 min   Charges:   PT Evaluation $PT Eval Moderate Complexity: 1 Mod PT Treatments $Gait Training: 8-22 mins           Doreatha Massed, PT Acute Rehabilitation

## 2019-07-24 NOTE — Progress Notes (Signed)
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  74 year old female who presented with hypotension after a ultrasound-guided liver biopsy by IR. Patient does have the significant past medical history for hypertension, dyslipidemia, breast/parotid cancer and GERD. Patient reported about 7 days of skin discoloration, associated with nausea, vomiting, decreased appetite and generalized weakness. July 05, 2019 she was diagnosed with liver lesions per CT scan of the chest which was done prior to follow-up with cancer surveillance. Patient was referred for IR guided liver biopsy.Her preoperative studies showed hyponatremia, hypokalemia, hyperbilirubinemia and elevated liver enzymes. After ultrasound-guided biopsy,her blood pressure was 66/36, with IV fluids improved to 101/55, heart rate 73, respiratory rate 16, temperature 97.9, oxygen saturation 99%,her lungs were clear to auscultation bilaterally, heart S1-S2 present rhythmic, her abdomen was soft and nontender, no lower extremity edema. MRCP with widespread metastatic disease to the liver. Dominant index lesion in the right hepatic lobe 4.0 x 3.0 cm. Intrahepatic biliary dilatation with central truncation of the biliary tree.  Patient has been placed on intravenous fluids and close hemodynamic monitoring.   Liver biopsy positive for metastatic breast cancer.   ERCP with severe biliary stricturing, at the middle and upper third of the main bile duct, dilation of the right and left intrahepatic ducts. Now sp biliary sphincterectomy/ sphincterectoplasty. Plastic biliary stents were placed at the right, left hepatic ducts and at the ventral pancreatic duct.    Assessment & Plan:   Principal Problem:   Obstructive jaundice Active  Problems:   Malignant neoplasm of lower-inner quadrant of right breast of female, estrogen receptor positive (HCC)   Cancer of parotid gland (HCC)   Nodule on liver   Hypokalemia   Hyponatremia   Transaminitis   Hypotension   Elevated bilirubin    1. Obstructive jaundice/ malignant Bismuth type 2 stricture (involving the confluence of the right and left hepatic ducts) with high risk for cholangitis. SP sphincterectomy and sphincteroplasty, stents at the right and left hepatic ducts, stent at the ventral pancreatic duct 05/07. (Note patient had one dose of 125 mg Methylprednisolone).   Liver panel this am with trending down liver enzymes and bilirubins:  ALK phos 981, AST 704, ALT 274, Total Bilirubin 18,4.   Now on a regular heart healthy diet, plan to continue withIV antibiotic therapy withZosyn for a total of 5 days post procedure. Continue with as needed analgesics (morphine and oxycodone) and scheduled antiacids.   Resume enoxaparin in am.  Patient will need radiographic follow up as outpatient. Patient this am very weak and deconditioned, will follow with PT and OT recommendations. Continue with nutritional supplements. Out of bed to chair tid with meals.   2. HTN/ dyslipidemia.(Holdingtelemisartan and hctz/ statin therapy). Blood pressure 132/72, will continue close monitoring.   3. AKI with Hyponatremia and hypokalemia. Non gap metabolic acidosis.This am with worsening renal function with serum cr up to 1,50 with K at 3,9 and serum bicarbonate at 21, Mg 2,5.   Will resume IV fluids with LR at 75 ml per H, will follow up with renal function in am. Avoid hypotension and nephrotoxic medications.   Patient continue with high risk of worsening AKI.   4. Hx of breast cancer with diffuse liver metastasis.Onanastrazole. Patient will follow up with Dr. Burr Medico from oncology as outpatient.  5. GERD.Onfamotidine  Status is: Inpatient  Remains inpatient appropriate  because:IV treatments appropriate due to intensity of illness or inability to take PO   Dispo: The patient is from: Home              Anticipated d/c is to: Home              Anticipated d/c date is: 2 days              Patient currently is not medically stable to d/c.        DVT prophylaxis: Enoxaparin and scd   Code Status:   full  Family Communication:  No family at the bedside      Nutrition Status: Nutrition Problem: Inadequate oral intake Etiology: nausea, vomiting, decreased appetite Signs/Symptoms: per patient/family report(progressive over the past month) Interventions: Boost Breeze      Consultants:   GI   Procedures:   ERCP with bilateral hepatic and pancreatic duct stents.   Subjective: Patient is feeling very weak and deconditioned, improved abdominal pain, no nausea or vomiting. Continue with severe icterus.   Objective: Vitals:   07/23/19 2153 07/24/19 0203 07/24/19 0549 07/24/19 0930  BP: 111/65 113/63 118/87 121/71  Pulse: 87 86 84 82  Resp: '19 18 18 16  ' Temp: 97.6 F (36.4 C) 98.7 F (37.1 C) (!) 97.5 F (36.4 C) 97.7 F (36.5 C)  TempSrc: Oral Oral    SpO2: 98%  93% 95%  Weight:      Height:        Intake/Output Summary (Last 24 hours) at 07/24/2019 1104 Last data filed at 07/24/2019 0950 Gross per 24 hour  Intake 638.45 ml  Output 1000 ml  Net -361.55 ml   Filed Weights   07/20/19 2051 07/23/19 0653  Weight: 77.1 kg 77.1 kg    Examination:   General: Not in pain or dyspnea, deconditioned  Neurology: Awake and alert, non focal  E ENT: positive pallor, and icterus, oral mucosa moist Cardiovascular: No JVD. S1-S2 present, rhythmic, no gallops, rubs, or murmurs. No lower extremity edema. Pulmonary: positive breath sounds bilaterally, adequate air movement, no wheezing, rhonchi or rales. Gastrointestinal. Abdomen soft with no organomegaly, non tender, no rebound or guarding Skin. No rashes Musculoskeletal: no joint  deformities     Data Reviewed: I have personally reviewed following labs and imaging studies  CBC: Recent Labs  Lab 07/20/19 1130 07/20/19 1958 07/22/19 0451 07/23/19 0423 07/24/19 0527  WBC 8.8 7.8 7.6 7.3 8.9  NEUTROABS 7.4  --  5.9 5.7 8.1*  HGB 15.1* 13.8 14.5 14.7 13.6  HCT 42.6 39.3 41.1 43.0 39.4  MCV 89.3 89.1 88.4 89.8 90.8  PLT 207 185 178 188 027   Basic Metabolic Panel: Recent Labs  Lab 07/20/19 1130 07/20/19 1130 07/20/19 1958 07/21/19 0437 07/22/19 0451 07/23/19 0423 07/24/19 0527  NA 129*  --   --  132* 134* 134* 135  K 2.6*  --   --  3.0* 3.9 3.1* 3.9  CL 87*  --   --  99 101 103 104  CO2 28  --   --  23 22 21* 21*  GLUCOSE 135*  --   --  95 85 92 140*  BUN 35*  --   --  37* 33* 24* 33*  CREATININE 1.31*   < > 1.38* 1.35* 1.44* 1.16* 1.50*  CALCIUM 8.5*  --   --  7.3* 7.7* 8.2* 8.2*  MG  --   --   --  2.5* 2.5*  --  2.5*   < > = values in this interval not displayed.   GFR: Estimated Creatinine Clearance: 33.3 mL/min (A) (by C-G formula based on SCr of 1.5 mg/dL (H)). Liver Function Tests: Recent Labs  Lab 07/20/19 1511 07/21/19 0437 07/22/19 0451 07/23/19 0423 07/24/19 0527  AST 1,026* 929* 972* 841* 704*  ALT 378* 320* 331* 320* 274*  ALKPHOS 1,054* 978* 1,108* 1,064* 981*  BILITOT 18.8* 16.9* 19.3* 20.2* 18.4*  PROT 5.7* 5.3* 5.3* 5.0* 4.7*  ALBUMIN 2.3* 2.4* 2.2* 2.0* 2.0*   Recent Labs  Lab 07/20/19 1511 07/24/19 0527  LIPASE 143* 105*   No results for input(s): AMMONIA in the last 168 hours. Coagulation Profile: Recent Labs  Lab 07/20/19 1130 07/21/19 0437 07/23/19 0423  INR 1.2 1.4* 1.6*   Cardiac Enzymes: No results for input(s): CKTOTAL, CKMB, CKMBINDEX, TROPONINI in the last 168 hours. BNP (last 3 results) No results for input(s): PROBNP in the last 8760 hours. HbA1C: No results for input(s): HGBA1C in the last 72 hours. CBG: No results for input(s): GLUCAP in the last 168 hours. Lipid Profile: No results  for input(s): CHOL, HDL, LDLCALC, TRIG, CHOLHDL, LDLDIRECT in the last 72 hours. Thyroid Function Tests: No results for input(s): TSH, T4TOTAL, FREET4, T3FREE, THYROIDAB in the last 72 hours. Anemia Panel: No results for input(s): VITAMINB12, FOLATE, FERRITIN, TIBC, IRON, RETICCTPCT in the last 72 hours.    Radiology Studies: I have reviewed all of the imaging during this hospital visit personally     Scheduled Meds: . anastrozole  1 mg Oral Daily  . famotidine  20 mg Oral BID  . feeding supplement  1 Container Oral TID BM   Continuous Infusions: . piperacillin-tazobactam (ZOSYN)  IV 3.375 g (07/24/19 1021)     LOS: 4 days        Nylee Barbuto Gerome Apley, MD

## 2019-07-25 ENCOUNTER — Inpatient Hospital Stay (HOSPITAL_COMMUNITY): Payer: Medicare PPO

## 2019-07-25 DIAGNOSIS — N179 Acute kidney failure, unspecified: Secondary | ICD-10-CM

## 2019-07-25 LAB — CBC WITH DIFFERENTIAL/PLATELET
Abs Immature Granulocytes: 0.09 10*3/uL — ABNORMAL HIGH (ref 0.00–0.07)
Basophils Absolute: 0 10*3/uL (ref 0.0–0.1)
Basophils Relative: 0 %
Eosinophils Absolute: 0 10*3/uL (ref 0.0–0.5)
Eosinophils Relative: 0 %
HCT: 36.4 % (ref 36.0–46.0)
Hemoglobin: 12.5 g/dL (ref 12.0–15.0)
Immature Granulocytes: 1 %
Lymphocytes Relative: 2 %
Lymphs Abs: 0.3 10*3/uL — ABNORMAL LOW (ref 0.7–4.0)
MCH: 31.3 pg (ref 26.0–34.0)
MCHC: 34.3 g/dL (ref 30.0–36.0)
MCV: 91 fL (ref 80.0–100.0)
Monocytes Absolute: 1.1 10*3/uL — ABNORMAL HIGH (ref 0.1–1.0)
Monocytes Relative: 9 %
Neutro Abs: 11.2 10*3/uL — ABNORMAL HIGH (ref 1.7–7.7)
Neutrophils Relative %: 88 %
Platelets: 202 10*3/uL (ref 150–400)
RBC: 4 MIL/uL (ref 3.87–5.11)
RDW: 17.2 % — ABNORMAL HIGH (ref 11.5–15.5)
WBC: 12.6 10*3/uL — ABNORMAL HIGH (ref 4.0–10.5)
nRBC: 0.2 % (ref 0.0–0.2)

## 2019-07-25 LAB — COMPREHENSIVE METABOLIC PANEL
ALT: 266 U/L — ABNORMAL HIGH (ref 0–44)
AST: 613 U/L — ABNORMAL HIGH (ref 15–41)
Albumin: 1.7 g/dL — ABNORMAL LOW (ref 3.5–5.0)
Alkaline Phosphatase: 824 U/L — ABNORMAL HIGH (ref 38–126)
Anion gap: 12 (ref 5–15)
BUN: 55 mg/dL — ABNORMAL HIGH (ref 8–23)
CO2: 19 mmol/L — ABNORMAL LOW (ref 22–32)
Calcium: 7.8 mg/dL — ABNORMAL LOW (ref 8.9–10.3)
Chloride: 102 mmol/L (ref 98–111)
Creatinine, Ser: 2.23 mg/dL — ABNORMAL HIGH (ref 0.44–1.00)
GFR calc Af Amer: 24 mL/min — ABNORMAL LOW (ref >60)
GFR calc non Af Amer: 21 mL/min — ABNORMAL LOW (ref >60)
Glucose, Bld: 138 mg/dL — ABNORMAL HIGH (ref 70–99)
Potassium: 3.4 mmol/L — ABNORMAL LOW (ref 3.5–5.1)
Sodium: 133 mmol/L — ABNORMAL LOW (ref 135–145)
Total Bilirubin: 16.6 mg/dL — ABNORMAL HIGH (ref 0.3–1.2)
Total Protein: 4.6 g/dL — ABNORMAL LOW (ref 6.5–8.1)

## 2019-07-25 LAB — NA AND K (SODIUM & POTASSIUM), RAND UR
Potassium Urine: 38 mmol/L
Sodium, Ur: <10 mmol/L

## 2019-07-25 LAB — CREATININE, URINE, RANDOM: Creatinine, Urine: 93.22 mg/dL

## 2019-07-25 MED ORDER — CHLORHEXIDINE GLUCONATE CLOTH 2 % EX PADS
6.0000 | MEDICATED_PAD | Freq: Every day | CUTANEOUS | Status: DC
  Administered 2019-07-26 – 2019-08-06 (×9): 6 via TOPICAL

## 2019-07-25 MED ORDER — FAMOTIDINE 20 MG PO TABS
20.0000 mg | ORAL_TABLET | Freq: Every day | ORAL | Status: DC
  Administered 2019-07-26 – 2019-08-06 (×12): 20 mg via ORAL
  Filled 2019-07-25 (×12): qty 1

## 2019-07-25 MED ORDER — ENOXAPARIN SODIUM 30 MG/0.3ML ~~LOC~~ SOLN
30.0000 mg | Freq: Every day | SUBCUTANEOUS | Status: DC
  Administered 2019-07-26 – 2019-07-31 (×6): 30 mg via SUBCUTANEOUS
  Filled 2019-07-25 (×6): qty 0.3

## 2019-07-25 NOTE — Plan of Care (Signed)

## 2019-07-25 NOTE — Progress Notes (Signed)
Physical Therapy Treatment Patient Details Name: [name] MRN: [id number] DOB: 16-Jul-1945 Today's Date: 07/25/2019    History of Present Illness 74 yo female admitted with diffuse liver mets, obstructive jaundice with high risk for cholangitis, AKI. S/P ERCP with biliary stent placement 5/7. New onset of weakness, proximal LE weakness > distal LE weakness. Hx of met breast cancer.    PT Comments    Pt remains eager to participate with therapies. She required increased assistance for bed mobility and standing on today. Also, hip musculature appears weaker today than yesterday-hip strength ~2/5 on today. Knee and ankle strength remains the same. Worked on dynamic sitting balance tasks, standing, and bed level exercises (APs, QSs, Heel Slides, bridging) on today.  Continue to recommend neurology consult if hospitalist agrees/feels it is warranted (secure chat with Dr Cathlean Sauer about my concerns). Will continue to follow.    Follow Up Recommendations  CIR     Equipment Recommendations  Rolling walker with 5" wheels;Wheelchair (measurements PT);3in1 (PT)    Recommendations for Other Services Rehab consult;OT consult     Precautions / Restrictions Precautions Precautions: Fall Precaution Comments: high fall risk Restrictions Weight Bearing Restrictions: No    Mobility  Bed Mobility Overal bed mobility: Needs Assistance Bed Mobility: Supine to Sit;Sit to Supine     Supine to sit: Mod assist;HOB elevated Sit to supine: Mod assist;HOB elevated   General bed mobility comments: Assist for trunk and bil LEs. Pt relied heavily on bedrail. Increased time and effort for pt to complete task. More difficulty today compared to yesterday.  Transfers Overall transfer level: Needs assistance Equipment used: Rolling walker (2 wheeled) Transfers: Sit to/from Stand;Lateral/Scoot Transfers Sit to Stand: Mod assist;+2 physical assistance;+2 safety/equipment;From elevated surface         Lateral/Scoot Transfers: Min assist General transfer comment: 4 sit to stand attempts. Pt was able to complete 2/4 sucessfully. She was able to stand for ~10 seconds each time. Flexed trunk and knee posture and wide BOS. Assist to power up, stabilize, control descent. Poor eccentric control during stand to sit. Min assist for later scoot towards [place].  Ambulation/Gait             General Gait Details: NT-unable on today   Stairs             Wheelchair Mobility    Modified Rankin (Stroke Patients Only)       Balance   Sitting-balance support: Feet supported;Bilateral upper extremity supported Sitting balance-Leahy Scale: Fair Sitting balance - Comments: Loss of balance posteriorly while scooting to the edge - needing to hang onto rail to come forward. Once in feet supported position balance improved.   Standing balance support: Bilateral upper extremity supported Standing balance-Leahy Scale: Poor Standing balance comment: High fall risk                            Cognition Arousal/Alertness: Awake/alert Behavior During Therapy: WFL for tasks assessed/performed Overall Cognitive Status: Within Functional Limits for tasks assessed                                        Exercises General Exercises - Lower Extremity Ankle Circles/Pumps: AROM;Both;5 reps Quad Sets: AROM;Both;5 reps Heel Slides: AROM;Both;5 reps(limited range 2* weakness) Other Exercises Other Exercises: Bridging x 5: partial range 2* weakness.    General Comments  Pertinent Vitals/Pain Pain Assessment: No/denies pain    Home Living Family/patient expects to be discharged to:: Private residence Living Arrangements: Spouse/significant other(husband with recent shoulder surgery) Available Help at Discharge: Family;Available 24 hours/day Type of Home: House Home Access: Stairs to enter Entrance Stairs-Rails: Left;Right;Can reach both Home Layout: One  level Home Equipment: Shower seat - built in      Prior Function Level of Independence: Independent          PT Goals (current goals can now be found in the care plan section) Acute Rehab PT Goals Patient Stated Goal: To improve strength in legs Progress towards PT goals: Progressing toward goals    Frequency    Min 3X/week      PT Plan Current plan remains appropriate    Co-evaluation              AM-PAC PT "6 Clicks" Mobility   Outcome Measure  Help needed turning from your back to your side while in a flat bed without using bedrails?: A Little Help needed moving from lying on your back to sitting on the side of a flat bed without using bedrails?: A Lot Help needed moving to and from a bed to a chair (including a wheelchair)?: A Lot Help needed standing up from a chair using your arms (e.g., wheelchair or bedside chair)?: A Lot Help needed to walk in hospital room?: Total Help needed climbing 3-5 steps with a railing? : Total 6 Click Score: 11    End of Session Equipment Utilized During Treatment: Gait belt Activity Tolerance: Patient tolerated treatment well;Patient limited by fatigue Patient left: in bed;with call bell/phone within reach;with bed alarm set;with family/visitor present   PT Visit Diagnosis: Difficulty in walking, not elsewhere classified (R26.2);Muscle weakness (generalized) (M62.81)     Time: 4383-8184 PT Time Calculation (min) (ACUTE ONLY): 23 min  Charges:  $Therapeutic Exercise: 8-22 mins $Therapeutic Activity: 8-22 mins                         Doreatha Massed, PT Acute Rehabilitation

## 2019-07-25 NOTE — Progress Notes (Signed)
Inpatient Rehab Admissions Coordinator Note:   Per OT/PT recommendations, pt was screened for CIR candidacy by Gayland Curry, MS, CCC-SLP.  At this time we are recommending Inpatient Rehab consult.  AC will contact MD to place consult order.  Please contact me with questions.   Gayland Curry, [place], [place] Admissions Coordinator [phone]

## 2019-07-25 NOTE — Progress Notes (Signed)
Dr. Cathlean Sauer notified of pt poor urinary output. Bladder scan showed 241 and pt could not urinate in bedpan. Urine sample needed.

## 2019-07-25 NOTE — Progress Notes (Signed)
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  74 year old female who presented with hypotension after a ultrasound-guided liver biopsy by IR. Patient does have the significant past medical history for hypertension, dyslipidemia, breast/parotid cancer and GERD. Patient reported about 7 days of skin discoloration, associated with nausea, vomiting, decreased appetite and generalized weakness. July 05, 2019 she was diagnosed with liver lesions per CT scan of the chest which was done prior to follow-up with cancer surveillance. Patient was referred for IR guided liver biopsy.Her preoperative studies showed hyponatremia, hypokalemia, hyperbilirubinemia and elevated liver enzymes. After ultrasound-guided biopsy,her blood pressure was 66/36, with IV fluids improved to 101/55, heart rate 73, respiratory rate 16, temperature 97.9, oxygen saturation 99%,her lungs were clear to auscultation bilaterally, heart S1-S2 present rhythmic, her abdomen was soft and nontender, no lower extremity edema. MRCP with widespread metastatic disease to the liver. Dominant index lesion in the right hepatic lobe 4.0 x 3.0 cm. Intrahepatic biliary dilatation with central truncation of the biliary tree.  Patient has been placed on intravenous fluids and close hemodynamic monitoring.   Liver biopsy positive for metastatic breast cancer.   ERCP with severe biliary stricturing, at the middle and upper third of the main bile duct, dilation of the right and left intrahepatic ducts. Now sp biliary sphincterectomy/ sphincterectoplasty. Plastic biliary stents were placed at the right, left hepatic ducts and at the ventral pancreatic duct.   Patient will need radiographic follow up as outpatient. Patient very weak and  deconditioned, PT/OT recommendation for CIR.   Assessment & Plan:   Principal Problem:   Obstructive jaundice Active Problems:   Malignant neoplasm of lower-inner quadrant of right breast of female, estrogen receptor positive (HCC)   Cancer of parotid gland (HCC)   Nodule on liver   Hypokalemia   Hyponatremia   Transaminitis   Hypotension   Elevated bilirubin   1. 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 (Note patient had one dose of 125 mg Methylprednisolone).   Liver panel this am with trending down liver enzymes and bilirubins:  ALK phos 824, AST 613, ALT 266, Total Bilirubin 16.6.   Tolerating well regular heart healthy diet, continue antibiotic therapy withZosynfor a total of 5 days post procedure.On as needed analgesics (morphine and oxycodone) plus scheduled antiacids.   Will continue with nutritional supplements, out of bed to chair tid with meals. Consult inpatient rehab for CIR evaluation. This am ordered head CT to rule out acute CVA.   2. HTN/ dyslipidemia.(continue holdingtelemisartan and hctz/ statin therapy).Blood pressure has been stable.  3. AKI with Hyponatremia and hypokalemia. Non gap metabolic acidosis.Continue worsening renal function with serum cr up to 2,23 with K at 3,4 and serum bicarbonate down to 19 with Cl 102, anion gap is 12. Urine output 1,125 ml over last 24 H.   Will continue close monitoring of renal function and electrolytes, continue hydration with balanced electrolyte solutions, will check urine electrolytes, creatinine and osmolality. Continue to avoid nephrotoxic medications and hypotension.   Patient continue with high risk of worsening AKI.   4. Hx of breast cancerwith diffuse liver  metastasis.Continue with anastrazole.Dr.  Feng from oncology will follow patient in the office.  5. GERD.Continue with famotidinefamotidine   Status is: Inpatient  Remains inpatient appropriate because:IV treatments appropriate due to intensity of illness or inability to take PO   Dispo: The patient is from: Home              Anticipated d/c is to: CIR              Anticipated d/c date is: 3 days              Patient currently is not medically stable to d/c.        DVT prophylaxis: Enoxaparin   Code Status:   full  Family Communication:  I spoke with patient's husband at the bedside, we talked in detail about patient's condition, plan of care and prognosis and all questions were addressed.      Nutrition Status: Nutrition Problem: Inadequate oral intake Etiology: nausea, vomiting, decreased appetite Signs/Symptoms: per patient/family report(progressive over the past month) Interventions: Boost Breeze   Consultants:   GI   Procedures:   ERCP with bilateral hepatic and pancreatic duct stents.   Antimicrobials:   Zosyn IV     Subjective: Patient is feeling better, but very weak and deconditioned, has difficulty ambulating, no nausea or vomiting, no chest pain or dyspnea.   Objective: Vitals:   07/24/19 1352 07/24/19 1739 07/24/19 2138 07/25/19 0559  BP: 124/70 119/66 114/69 (!) 104/53  Pulse: 89 91 87 63  Resp: '18 16 18 18  ' Temp: 97.9 F (36.6 C) 97.9 F (36.6 C) 98.3 F (36.8 C) 97.6 F (36.4 C)  TempSrc: Oral Oral Oral Oral  SpO2: 96% 96% 95% 96%  Weight:      Height:        Intake/Output Summary (Last 24 hours) at 07/25/2019 1308 Last data filed at 07/25/2019 0957 Gross per 24 hour  Intake 2933.98 ml  Output 425 ml  Net 2508.98 ml   Filed Weights   07/20/19 2051 07/23/19 0653  Weight: 77.1 kg 77.1 kg    Examination:   General: Not in pain or dyspnea, deconditioned  Neurology: Awake and alert, non focal. Strength assessed while in  bed, upper and lower extremities 4/5 all four symmetric.  E ENT: mild pallor, positive icterus, oral mucosa moist Cardiovascular: No JVD. S1-S2 present, rhythmic, no gallops, rubs, or murmurs. No lower extremity edema. Pulmonary: positive breath sounds bilaterally, adequate air movement, no wheezing, rhonchi or rales. Gastrointestinal. Abdomen with no organomegaly, non tender, no rebound or guarding Skin. No rashes Musculoskeletal: no joint deformities     Data Reviewed: I have personally reviewed following labs and imaging studies  CBC: Recent Labs  Lab 07/20/19 1130 07/20/19 1130 07/20/19 1958 07/22/19 0451 07/23/19 0423 07/24/19 0527 07/25/19 0414  WBC 8.8   < > 7.8 7.6 7.3 8.9 12.6*  NEUTROABS 7.4  --   --  5.9 5.7 8.1* 11.2*  HGB 15.1*   < > 13.8 14.5 14.7 13.6 12.5  HCT 42.6   < > 39.3 41.1 43.0 39.4 36.4  MCV 89.3   < > 89.1 88.4 89.8 90.8 91.0  PLT 207   < > 185 178 188 183 202   < > = values in this interval not displayed.   Basic Metabolic Panel: Recent Labs  Lab 07/21/19 0437 07/22/19 0451 07/23/19 0423 07/24/19 0527 07/25/19 0414  NA 132* 134* 134* 135 133*  K 3.0* 3.9 3.1* 3.9 3.4*  CL 99 101 103 104  102  CO2 23 22 21* 21* 19*  GLUCOSE 95 85 92 140* 138*  BUN 37* 33* 24* 33* 55*  CREATININE 1.35* 1.44* 1.16* 1.50* 2.23*  CALCIUM 7.3* 7.7* 8.2* 8.2* 7.8*  MG 2.5* 2.5*  --  2.5*  --    GFR: Estimated Creatinine Clearance: 22.4 mL/min (A) (by C-G formula based on SCr of 2.23 mg/dL (H)). Liver Function Tests: Recent Labs  Lab 07/21/19 0437 07/22/19 0451 07/23/19 0423 07/24/19 0527 07/25/19 0414  AST 929* 972* 841* 704* 613*  ALT 320* 331* 320* 274* 266*  ALKPHOS 978* 1,108* 1,064* 981* 824*  BILITOT 16.9* 19.3* 20.2* 18.4* 16.6*  PROT 5.3* 5.3* 5.0* 4.7* 4.6*  ALBUMIN 2.4* 2.2* 2.0* 2.0* 1.7*   Recent Labs  Lab 07/20/19 1511 07/24/19 0527  LIPASE 143* 105*   No results for input(s): AMMONIA in the last 168 hours. Coagulation  Profile: Recent Labs  Lab 07/20/19 1130 07/21/19 0437 07/23/19 0423  INR 1.2 1.4* 1.6*   Cardiac Enzymes: No results for input(s): CKTOTAL, CKMB, CKMBINDEX, TROPONINI in the last 168 hours. BNP (last 3 results) No results for input(s): PROBNP in the last 8760 hours. HbA1C: No results for input(s): HGBA1C in the last 72 hours. CBG: No results for input(s): GLUCAP in the last 168 hours. Lipid Profile: No results for input(s): CHOL, HDL, LDLCALC, TRIG, CHOLHDL, LDLDIRECT in the last 72 hours. Thyroid Function Tests: No results for input(s): TSH, T4TOTAL, FREET4, T3FREE, THYROIDAB in the last 72 hours. Anemia Panel: No results for input(s): VITAMINB12, FOLATE, FERRITIN, TIBC, IRON, RETICCTPCT in the last 72 hours.    Radiology Studies: I have reviewed all of the imaging during this hospital visit personally     Scheduled Meds: . anastrozole  1 mg Oral Daily  . [START ON 07/26/2019] enoxaparin (LOVENOX) injection  30 mg Subcutaneous Daily  . [START ON 07/26/2019] famotidine  20 mg Oral Daily  . feeding supplement  1 Container Oral TID BM   Continuous Infusions: . lactated ringers 75 mL/hr at 07/25/19 0751  . piperacillin-tazobactam (ZOSYN)  IV 3.375 g (07/25/19 0909)     LOS: 5 days        Harris Kistler Gerome Apley, MD

## 2019-07-25 NOTE — Progress Notes (Signed)
Foley catheter ordered and inserted by nursing staff. Foley cath ordered by Dr. Cathlean Sauer after rn called and notified of md of concerns for increased leg swelling and decreased urine output. Md agreed this was a concern. Rn will continue to monitor. Bladder scan showed 241 and pt could not urinate in bedpan after trying. She did not feel like she had to urinate.

## 2019-07-25 NOTE — Progress Notes (Signed)
Pt stable overall though very weak. Pt unable to stand this am with two person assist. Family in room concerned with pt overall progression and weakness. Rn will continue to monitor.

## 2019-07-25 NOTE — Evaluation (Addendum)
Occupational Therapy Re-Evaluation Patient Details Name: [name] MRN: [id number] DOB: [birth year] Today's Date: 07/25/2019    History of Present Illness 74 yo female admitted with diffuse liver mets, obstructive jaundice with high risk for cholangitis, AKI. S/P ERCP with biliary stent placement 5/7. New onset of weakness, proximal LE weakness > distal LE weakness. Hx of met breast cancer.   Clinical Impression   Mrs. [name] is a 74 year old woman with complicated medical history who reports full independence on April 22nd, two weeks of weakness and now new onset of lower extremity weakness. On evaluation patient presents with functional ROM and strength of upper extremities, significant weakness in bilateral lower extremities, decreased activity tolerance and impaired sitting and standing balance resulting in impaired ability to ambulate, transfer and perform lower body ADLs. Patient unable to lift legs off of the bed, able to stand for 10 seconds with a walker but unable to move feet to step and then unable to perform another stand requiring scooting to recliner. Patient will benefit from skilled OT services to improve deficits and return to PLOF. Due to patient's prior level of independence such a short time ago patient would benefit from aggressive therapy to improve impairments. Short term rehab recommended before return home.    Follow Up Recommendations  CIR   Equipment Recommendations  (To be determined depending on DC venue.)    Recommendations for Other Services Rehab consult     Precautions / Restrictions Precautions Precautions: Fall Restrictions Weight Bearing Restrictions: No      Mobility Bed Mobility Overal bed mobility: Needs Assistance Bed Mobility: Supine to Sit     Supine to sit: Mod assist     General bed mobility comments: Increased time and effort. Moderate reliance on bedrail. Assist for bil LEs to slide off of bed. Patient unable to lift legs.  Patient able to use upper extremities to scoot.  Transfers Overall transfer level: Needs assistance Equipment used: Rolling walker (2 wheeled) Transfers: Sit to/from Stand Sit to Stand: Mod assist;From elevated surface         General transfer comment: Able to stand x 1 with RW for approx 10 seconds. Unable to perform second stand. Unable to lift feet for step. Patient able to scoot to the right into recliner flush to the bed.    Balance   Sitting-balance support: Feet supported;Bilateral upper extremity supported Sitting balance-Leahy Scale: Fair Sitting balance - Comments: Loss of balance posteriorly while scooting to the edge - needing to hang onto rail to come forward. Once in feet supported position balance improved.   Standing balance support: Bilateral upper extremity supported Standing balance-Leahy Scale: Poor Standing balance comment: High fall risk                           ADL either performed or assessed with clinical judgement   ADL Overall ADL's : Needs assistance/impaired Eating/Feeding: Independent   Grooming: Set up   Upper Body Bathing: Set up   Lower Body Bathing: +2 for physical assistance;Maximal assistance;Sit to/from stand   Upper Body Dressing : Set up;Sitting   Lower Body Dressing: +2 for physical assistance;Sit to/from stand;Total assistance   Toilet Transfer: +2 for physical assistance;BSC   Toileting- Clothing Manipulation and Hygiene: +2 for physical assistance;Sit to/from stand   Tub/ Shower Transfer: +2 for physical assistance;Shower seat;Stand-pivot   Functional mobility during ADLs: +2 for physical assistance;Rolling walker       Vision Baseline  Vision/History: No visual deficits       Perception     Praxis      Pertinent Vitals/Pain Pain Assessment: No/denies pain     Hand Dominance Right   Extremity/Trunk Assessment Upper Extremity Assessment Upper Extremity Assessment: Overall WFL for tasks assessed(4/5  strength for upper extremities. Functional strength for ADLs, bed transfers, but not for sustained use with RW to compensate for weaker lower extremities.)   Lower Extremity Assessment Lower Extremity Assessment: Defer to PT evaluation   Cervical / Trunk Assessment Cervical / Trunk Assessment: Normal   Communication Communication Communication: No difficulties   Cognition Arousal/Alertness: Awake/alert Behavior During Therapy: WFL for tasks assessed/performed Overall Cognitive Status: Within Functional Limits for tasks assessed                                     General Comments       Exercises  Patient educated to perform seated marching while in recliner to work on improving hip strength needed for sit to stands. Patient performed with therapist in the room. Patient educated on therapeutic process and encouraged out of bed as much as possible to improve strength and improve LE strength.   Shoulder Instructions      Home Living Family/patient expects to be discharged to:: Private residence Living Arrangements: Spouse/significant other(husband with recent shoulder surgery) Available Help at Discharge: Family;Available 24 hours/day Type of Home: House Home Access: Stairs to enter [employer] of Steps: 5 Entrance Stairs-Rails: Left;Right;Can reach both Home Layout: One level     Bathroom Shower/Tub: Walk-in shower;Tub/shower unit   [employer]: Standard     Home Equipment: Shower seat - built in          Prior Functioning/Environment Level of Independence: Independent                 OT Problem List: Decreased strength;Decreased activity tolerance;Impaired balance (sitting and/or standing);Decreased knowledge of use of DME or AE;Decreased range of motion      OT Treatment/Interventions:      OT Goals(Current goals can be found in the care plan section) Acute Rehab OT Goals Patient Stated Goal: To improve strength in legs OT  Goal Formulation: With patient/family Time For Goal Achievement: 08/08/19 Potential to Achieve Goals: Fair ADL Goals Pt Will Perform Lower Body Bathing: with mod assist Pt Will Perform Lower Body Dressing: with adaptive equipment;with mod assist Pt Will Transfer to Toilet: with min assist;bedside commode;stand pivot transfer Pt Will Perform Toileting - Clothing Manipulation and hygiene: with mod assist;sit to/from stand Pt Will Perform Tub/Shower Transfer: with min assist;shower seat;grab bars Pt/caregiver will Perform Home Exercise Program: Increased strength;With theraband Additional ADL Goal #1: Patient will perform sit to stand with min assist and RW.  OT Frequency:     Barriers to D/C:            Co-evaluation              AM-PAC OT "6 Clicks" Daily Activity     Outcome Measure Help from another person eating meals?: None Help from another person taking care of personal grooming?: A Little Help from another person toileting, which includes using toliet, bedpan, or urinal?: Total Help from another person bathing (including washing, rinsing, drying)?: A Lot Help from another person to put on and taking off regular upper body clothing?: A Little Help from another person to put on and taking off regular  lower body clothing?: Total 6 Click Score: 14   End of Session Equipment Utilized During Treatment: Gait belt;Rolling walker Nurse Communication: Mobility status  Activity Tolerance: Patient tolerated treatment well Patient left: in chair;with call bell/phone within reach;with chair alarm set;with family/visitor present  OT Visit Diagnosis: Other abnormalities of gait and mobility (R26.89);Muscle weakness (generalized) (M62.81);Repeated falls (R29.6)                Time: 3200-9417 OT Time Calculation (min): 30 min Charges:  OT General Charges $OT Visit: 1 Visit OT Evaluation $OT Re-eval: 1 Re-eval OT Treatments $Therapeutic Activity: 8-22 mins  Derl Barrow, OTR/L Acute  Care Rehab Services  Office [phone]   Lenward Chancellor 07/25/2019, 12:42 PM

## 2019-07-26 ENCOUNTER — Inpatient Hospital Stay (HOSPITAL_COMMUNITY): Payer: Medicare PPO

## 2019-07-26 ENCOUNTER — Encounter: Payer: Self-pay | Admitting: *Deleted

## 2019-07-26 LAB — COMPREHENSIVE METABOLIC PANEL
ALT: 269 U/L — ABNORMAL HIGH (ref 0–44)
AST: 626 U/L — ABNORMAL HIGH (ref 15–41)
Albumin: 1.8 g/dL — ABNORMAL LOW (ref 3.5–5.0)
Alkaline Phosphatase: 727 U/L — ABNORMAL HIGH (ref 38–126)
Anion gap: 13 (ref 5–15)
BUN: 76 mg/dL — ABNORMAL HIGH (ref 8–23)
CO2: 19 mmol/L — ABNORMAL LOW (ref 22–32)
Calcium: 7.9 mg/dL — ABNORMAL LOW (ref 8.9–10.3)
Chloride: 102 mmol/L (ref 98–111)
Creatinine, Ser: 2.33 mg/dL — ABNORMAL HIGH (ref 0.44–1.00)
GFR calc Af Amer: 23 mL/min — ABNORMAL LOW (ref >60)
GFR calc non Af Amer: 20 mL/min — ABNORMAL LOW (ref >60)
Glucose, Bld: 123 mg/dL — ABNORMAL HIGH (ref 70–99)
Potassium: 3.5 mmol/L (ref 3.5–5.1)
Sodium: 134 mmol/L — ABNORMAL LOW (ref 135–145)
Total Bilirubin: 16.5 mg/dL — ABNORMAL HIGH (ref 0.3–1.2)
Total Protein: 4.4 g/dL — ABNORMAL LOW (ref 6.5–8.1)

## 2019-07-26 LAB — PROTEIN / CREATININE RATIO, URINE
Creatinine, Urine: 67.58 mg/dL
Protein Creatinine Ratio: 1.32 mg/mg{Cre} — ABNORMAL HIGH (ref 0.00–0.15)
Total Protein, Urine: 89 mg/dL

## 2019-07-26 LAB — OSMOLALITY, URINE: Osmolality, Ur: 445 mOsm/kg (ref 300–900)

## 2019-07-26 MED ORDER — SODIUM BICARBONATE 650 MG PO TABS
650.0000 mg | ORAL_TABLET | Freq: Two times a day (BID) | ORAL | Status: DC
  Administered 2019-07-26 – 2019-07-27 (×2): 650 mg via ORAL
  Filled 2019-07-26 (×2): qty 1

## 2019-07-26 MED ORDER — PIPERACILLIN-TAZOBACTAM 3.375 G IVPB
3.3750 g | Freq: Three times a day (TID) | INTRAVENOUS | Status: DC
  Administered 2019-07-26: 3.375 g via INTRAVENOUS
  Filled 2019-07-26 (×2): qty 50

## 2019-07-26 NOTE — Progress Notes (Signed)
PROGRESS NOTE    [name]  MIW:[id number]  DOB: Dec 03, 1945  PCP: [name], MD Admit date:07/20/2019 74 year old female with  history for hypertension, dyslipidemia, breast/parotid cancer and GERD who was recently noted to have new  liver lesions on surveillance CT chest  presented with hypotension after a ultrasound-guided liver biopsy by IR on the day of admission.Patient reported about 7 days of skin discoloration, associated with nausea, vomiting, decreased appetite and generalized weakness. Her preoperative studies showed hyponatremia, hypokalemia, hyperbilirubinemia and elevated liver enzymes.  ED Course: Afebrile. After ultrasound-guided biopsy,her blood pressure was 66/36, with IV fluids improved to 101/55, heart rate 73, respiratory rate 16, temperature 97.9, oxygen saturation 99%.WBC count 8.8, hemoglobin 15.1, platelet 207.  Sodium 129, potassium 2.6, chloride 87, CO2 28, BUN 35, creatinine 1.31, glucose 135.  Total bilirubin 22.5, AST 1193, ALT 462.  Patient was given 2 L NS bolus, Benadryl.  Hospital course: Patient admitted to [facility] for further evaluation and management,placed on intravenous fluids, close hemodynamic monitoring and [facility] GI consult placed. MRCP 5/4 with widespread metastatic disease to the liver. Dominant index lesion in the right hepatic lobe 4.0 x 3.0 cm. Intrahepatic biliary dilatation with central truncation of the biliary tree.Liver biopsy positive for metastatic breast cancer. ERCP on 5/7 revealed severe biliary stricturing, at the middle and upper third of the main bile duct, dilation of the right and left intrahepatic ducts. Now s/p biliary sphincterectomy/ sphincterectoplasty. Plastic biliary stents were placed at the right, left hepatic ducts and at the ventral pancreatic duct.Patient will need radiographic follow up as outpatient. Patient very weak and deconditioned, PT/OT recommendation for CIR.   Subjective: Patient complaining of swelling in legs and  inability to bear weight due to weakness.  Husband at bedside and also reports swelling mostly in the thighs.  Denies any dyspnea but reports generalized weakness.  Remains on IV fluids.  Patient also noted to have indwelling Foley catheter-states was placed yesterday for urinary retention.   Objective: Vitals:   07/25/19 0559 07/25/19 1415 07/25/19 2008 07/26/19 0649  BP: (!) 104/53 109/68 (!) 120/55 (!) 112/56  Pulse: 63 73 77 78  Resp: '18 16 16 17  ' Temp: 97.6 F (36.4 C) 97.8 F (36.6 C) 97.6 F (36.4 C) 98 F (36.7 C)  TempSrc: Oral Oral Oral Oral  SpO2: 96% 96% 96% 97%  Weight:      Height:        Intake/Output Summary (Last 24 hours) at 07/26/2019 0751 Last data filed at 07/26/2019 2500 Gross per 24 hour  Intake 3145.72 ml  Output 830 ml  Net 2315.72 ml   Filed Weights   07/20/19 2051 07/23/19 0653  Weight: 77.1 kg 77.1 kg    Physical Examination:  General exam: Appears weak and tired in general.  No acute distress noted Respiratory system: Clear to auscultation. Respiratory effort normal. Cardiovascular system: S1 & S2 heard, RRR. No JVD, murmurs, rubs, gallops or clicks. No pedal edema. Gastrointestinal system: Abdomen is nondistended, soft and nontender. Normal bowel sounds heard. Central nervous system: Alert and oriented. No new focal neurological deficits but he appears to have generalized weakness Extremities: No contractures or joint deformities.  No significant pitting leg edema but has mild swelling in thighs compared to baseline per patient/family Skin: No rashes, lesions or ulcers Psychiatry: Judgement and insight appear normal. Mood & affect appropriate.   Data Reviewed: I have personally reviewed following labs and imaging studies  CBC: Recent Labs  Lab 07/20/19 1130 07/20/19 1130 07/20/19  1958 07/22/19 0451 07/23/19 0423 07/24/19 0527 07/25/19 0414  WBC 8.8   < > 7.8 7.6 7.3 8.9 12.6*  NEUTROABS 7.4  --   --  5.9 5.7 8.1* 11.2*  HGB 15.1*    < > 13.8 14.5 14.7 13.6 12.5  HCT 42.6   < > 39.3 41.1 43.0 39.4 36.4  MCV 89.3   < > 89.1 88.4 89.8 90.8 91.0  PLT 207   < > 185 178 188 183 202   < > = values in this interval not displayed.   Basic Metabolic Panel: Recent Labs  Lab 07/21/19 0437 07/21/19 0437 07/22/19 0451 07/23/19 0423 07/24/19 0527 07/25/19 0414 07/26/19 0327  NA 132*   < > 134* 134* 135 133* 134*  K 3.0*   < > 3.9 3.1* 3.9 3.4* 3.5  CL 99   < > 101 103 104 102 102  CO2 23   < > 22 21* 21* 19* 19*  GLUCOSE 95   < > 85 92 140* 138* 123*  BUN 37*   < > 33* 24* 33* 55* 76*  CREATININE 1.35*   < > 1.44* 1.16* 1.50* 2.23* 2.33*  CALCIUM 7.3*   < > 7.7* 8.2* 8.2* 7.8* 7.9*  MG 2.5*  --  2.5*  --  2.5*  --   --    < > = values in this interval not displayed.   GFR: Estimated Creatinine Clearance: 21.4 mL/min (A) (by C-G formula based on SCr of 2.33 mg/dL (H)). Liver Function Tests: Recent Labs  Lab 07/22/19 0451 07/23/19 0423 07/24/19 0527 07/25/19 0414 07/26/19 0327  AST 972* 841* 704* 613* 626*  ALT 331* 320* 274* 266* 269*  ALKPHOS 1,108* 1,064* 981* 824* 727*  BILITOT 19.3* 20.2* 18.4* 16.6* 16.5*  PROT 5.3* 5.0* 4.7* 4.6* 4.4*  ALBUMIN 2.2* 2.0* 2.0* 1.7* 1.8*   Recent Labs  Lab 07/20/19 1511 07/24/19 0527  LIPASE 143* 105*   No results for input(s): AMMONIA in the last 168 hours. Coagulation Profile: Recent Labs  Lab 07/20/19 1130 07/21/19 0437 07/23/19 0423  INR 1.2 1.4* 1.6*   Cardiac Enzymes: No results for input(s): CKTOTAL, CKMB, CKMBINDEX, TROPONINI in the last 168 hours. BNP (last 3 results) No results for input(s): PROBNP in the last 8760 hours. HbA1C: No results for input(s): HGBA1C in the last 72 hours. CBG: No results for input(s): GLUCAP in the last 168 hours. Lipid Profile: No results for input(s): CHOL, HDL, LDLCALC, TRIG, CHOLHDL, LDLDIRECT in the last 72 hours. Thyroid Function Tests: No results for input(s): TSH, T4TOTAL, FREET4, T3FREE, THYROIDAB in the last  72 hours. Anemia Panel: No results for input(s): VITAMINB12, FOLATE, FERRITIN, TIBC, IRON, RETICCTPCT in the last 72 hours. Sepsis Labs: Recent Labs  Lab 07/20/19 1511  LATICACIDVEN 1.6    Recent Results (from the past 240 hour(s))  Respiratory Panel by RT PCR (Flu A&B, Covid) - Nasopharyngeal Swab     Status: None   Collection Time: 07/20/19  3:21 PM   Specimen: Nasopharyngeal Swab  Result Value Ref Range Status   SARS Coronavirus 2 by RT PCR NEGATIVE NEGATIVE Final    Comment: (NOTE) SARS-CoV-2 target nucleic acids are NOT DETECTED. The SARS-CoV-2 RNA is generally detectable in upper respiratoy specimens during the acute phase of infection. The lowest concentration of SARS-CoV-2 viral copies this assay can detect is 131 copies/mL. A negative result does not preclude SARS-Cov-2 infection and should not be used as the sole basis for treatment or other patient  management decisions. A negative result may occur with  improper specimen collection/handling, submission of specimen other than nasopharyngeal swab, presence of viral mutation(s) within the areas targeted by this assay, and inadequate number of viral copies (<131 copies/mL). A negative result must be combined with clinical observations, patient history, and epidemiological information. The expected result is Negative. Fact Sheet for Patients:  PinkCheek.be Fact Sheet for Healthcare Providers:  GravelBags.it This test is not yet ap proved or cleared by the [place] FDA and  has been authorized for detection and/or diagnosis of SARS-CoV-2 by FDA under an Emergency Use Authorization (EUA). This EUA will remain  in effect (meaning this test can be used) for the duration of the COVID-19 declaration under Section 564(b)(1) of the Act, 21 U.S.C. section 360bbb-3(b)(1), unless the authorization is terminated or revoked sooner.    Influenza A by PCR NEGATIVE NEGATIVE  Final   Influenza B by PCR NEGATIVE NEGATIVE Final    Comment: (NOTE) The Xpert Xpress SARS-CoV-2/FLU/RSV assay is intended as an aid in  the diagnosis of influenza from Nasopharyngeal swab specimens and  should not be used as a sole basis for treatment. Nasal washings and  aspirates are unacceptable for Xpert Xpress SARS-CoV-2/FLU/RSV  testing. Fact Sheet for Patients: PinkCheek.be Fact Sheet for Healthcare Providers: GravelBags.it This test is not yet approved or cleared by the [place] FDA and  has been authorized for detection and/or diagnosis of SARS-CoV-2 by  FDA under an Emergency Use Authorization (EUA). This EUA will remain  in effect (meaning this test can be used) for the duration of the  Covid-19 declaration under Section 564(b)(1) of the Act, 21  U.S.C. section 360bbb-3(b)(1), unless the authorization is  terminated or revoked. Performed at [facility], [place] [address], [place], [place] [address]       Radiology Studies: CT HEAD WO CONTRAST  Result Date: 07/25/2019 CLINICAL DATA:  Left leg weakness. Focal neurologic deficit greater than 6 hours. EXAM: CT HEAD WITHOUT CONTRAST TECHNIQUE: Contiguous axial images were obtained from the base of the skull through the vertex without intravenous contrast. COMPARISON:  Overlapping portions of CT neck from 07/05/2019 FINDINGS: Brain: Small remote lacunar infarct or focus of chronic ischemic microvascular white matter disease along the anterior limb of the left internal capsule on image 14/2. Periventricular white matter and corona radiata hypodensities favor chronic ischemic microvascular white matter disease. Otherwise, the brainstem, cerebellum, cerebral peduncles, thalamus, basal ganglia, basilar cisterns, and ventricular system appear within normal limits. No intracranial hemorrhage, mass lesion, or acute CVA. Vascular: There is atherosclerotic  calcification of the cavernous carotid arteries bilaterally. Skull: Unremarkable Sinuses/Orbits: Unremarkable Other: No supplemental non-categorized findings. IMPRESSION: 1. No acute intracranial findings. 2. Periventricular white matter and corona radiata hypodensities favor chronic ischemic microvascular white matter disease. 3. Small remote lacunar infarct or focus of chronic ischemic microvascular white matter disease along the anterior limb of the left internal capsule. 4. Atherosclerosis. Electronically Signed   By: Van Clines M.D.   On: 07/25/2019 13:48        Scheduled Meds: . anastrozole  1 mg Oral Daily  . Chlorhexidine Gluconate Cloth  6 each Topical Daily  . enoxaparin (LOVENOX) injection  30 mg Subcutaneous Daily  . famotidine  20 mg Oral Daily  . feeding supplement  1 Container Oral TID BM   Continuous Infusions: . lactated ringers 100 mL/hr at 07/26/19 0649     Assessment/Plan:  1. 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 On Zosyn since 5/8, GI recommended a total of 5 days post procedure (renewed).Liver panel this am with trending down liver enzymes and bilirubins (ALK phos 1064-->727, AST 1193--> 613, ALT 462-->266, Total Bilirubin 20.2-->16.5).  Albumin at 1.8 and INR 1.6. On asneeded analgesics (morphine and oxycodone) plus scheduledantiacids.Now tolerating regular diet.Will continue with nutritional supplements, out of bed to chair tid with meals. Per GI , patient will need KUB in 2 weeks for pancreatic stent follow-up ([facility] will arrange).Check LFTs in 2 weeks (patient may stop in [place] lab at [place]). Repeat ERCP in 3 months ([facility] will arrange)    2. AKI with Non anionic gap metabolic acidosis, mild hyponatremia. AKI could be related to initial hypotension/ATN versus prerenal in the setting of dehydration/diuretic use. HCTZ and Telmisartan on hold.  She did have urinary retention 5/9 and Foley catheter placed.  However noted to have continued worsening of renal function with BUN up to 70s and serum Cr up to 2.3.  Serum bicarbonate down to 19 with Cl 102, anion gap is 12. Urine output okay.ACEI/ARB/diuretics on hold.Continue IVF for now although patient and family concerned regarding leg swellings (could not appreciate significant edema in supine position but does have low albumin). Avoid hypotension and nephrotoxic medications.  Uremia could be contributing to overall weakness.  Will consult nephrology.  3. HTN/ dyslipidemia. Home medsTelemisartan and hctz/ statin therapy on hold since admission.Blood pressure improved since admission, low normal without meds.  4. Hx of Parotid/Breast cancer, nowwith diffuse liver metastasis.Continue with anastrazole.Dr. Burr Medico from oncology will follow patient in the office. CT head with old infarcts, no mets. Patient's oncologist, Dr. Lanell Persons referred her for a guided biopsy of the liver lesions after CT chest for interval screening of history of parotid cancer resulted abnormal.  5. Hyponatremia/Hypokalemia:  Etiology likely secondary to hypovolemic hyponatremia in the setting of poor oral intake and decreased appetite over the past week/month.  Sodium 129 and potassium 2.6  on admission.Now resolved with IVF/electrolyte replacement  6. GERD.Continue with famotidinefamotidine  7. Moderate malnutrition: in the setting of advanced malignancy. Continue protein supplements, Boost  DVT prophylaxis: Lovenox Code Status: Full code Family / Patient Communication: Discussed with patient and with husband at bedside in detail, all questions answered to satisfaction Disposition Plan:   Status is:  Inpatient  Remains inpatient appropriate because:Inpatient level of care appropriate due to severity of illness   Dispo: The patient is from: Home              Anticipated d/c is to: CIR              Anticipated d/c date is: 2 days              Patient currently is not medically stable to d/c. Need renal function to stabilize/improve and transition to oral abx.               Voiding trial prior to discharge     LOS: 6 days    Time spent: 35 minutes    Guilford Shi, MD [facility] Pager in [place]  If 7PM-7AM, please contact night-coverage [email or web address] 07/26/2019, 7:51 AM

## 2019-07-26 NOTE — Consult Note (Addendum)
[facility] Renal Consultation Note  Requesting [id number], Neelima, MD Indication for Consultation:  AKI   Chief complaint: outpatient labs with elevated LFT's and bili  HPI: [name] is a 74 y.o. female with a history of breast cancer, hypertension, and vertigo who presented to the hospital with after outpatient labs demonstrated transaminitis with elevated bili, hyponatremia, and hypokalemia.  States she has also been progressively weaker for about a week.  Total bili of 22.5 earlier in presentation and sodium was 129 on presentation here 5/4.  Note that she is on Micardis and HCTZ at home.  She has been off of the micardis and HCTZ here and was hydrated with normal saline and LR.  Cr trend as below on 5/4 was 1.31.  Rose to 2.33 today on consult.  Received indomethacin once on 5/7.  Contrast for ERCP only on 5/7.  She had a renal ultrasound this admission with right kidney 11.8 cm and left kidney 13.8 cm.  The renal cortex appeared partially echogenic.  No hydronephrosis or renal lesions were noted.  There was incidental note of bilateral pleural fluid with a small amount of ascites visible in the right abdomen.  ERCP with biliary stent placement on 5/7.  Baseline cr 1.03 on 07/05/19.  She states that she had noticed a yellow hue to her skin and that does appear to be improving.  Her urine had become orange and she thinks that is improving as well.  She states that she had a Foley catheter placed yesterday for retention and states that this was also in place because she was having weakness to the point of not able to walk.  Bladder scan with 241 mL on 5/9.  She reports infrequent nonsteroidal use; did use a couple of years ago perioperatively for hip surgery but stopped soon.  Occasional Advil PM.  Creatinine  Date/Time Value Ref Range Status  07/05/2019 07:53 AM 1.03 (H) 0.44 - 1.00 mg/dL Final  12/17/2018 11:50 AM 0.83 0.44 - 1.00 mg/dL Final  08/26/2018 09:35 AM 0.83 0.44 -  1.00 mg/dL Final  04/27/2018 12:25 PM 0.85 0.44 - 1.00 mg/dL Final  12/24/2017 01:16 PM 0.82 0.44 - 1.00 mg/dL Final  07/23/2017 12:11 PM 0.86 0.60 - 1.10 mg/dL Final   Creatinine, Ser  Date/Time Value Ref Range Status  07/26/2019 03:27 AM 2.33 (H) 0.44 - 1.00 mg/dL Final  07/25/2019 04:14 AM 2.23 (H) 0.44 - 1.00 mg/dL Final  07/24/2019 05:27 AM 1.50 (H) 0.44 - 1.00 mg/dL Final  07/23/2019 04:23 AM 1.16 (H) 0.44 - 1.00 mg/dL Final  07/22/2019 04:51 AM 1.44 (H) 0.44 - 1.00 mg/dL Final  07/21/2019 04:37 AM 1.35 (H) 0.44 - 1.00 mg/dL Final  07/20/2019 07:58 PM 1.38 (H) 0.44 - 1.00 mg/dL Final  07/20/2019 11:30 AM 1.31 (H) 0.44 - 1.00 mg/dL Final  11/18/2017 09:11 AM 0.85 0.44 - 1.00 mg/dL Final  03/29/2017 05:34 AM 0.78 0.44 - 1.00 mg/dL Final  03/27/2017 04:55 PM 0.91 0.44 - 1.00 mg/dL Final  12/01/2015 04:16 AM 0.74 0.44 - 1.00 mg/dL Final  11/30/2015 04:02 AM 0.72 0.44 - 1.00 mg/dL Final  11/22/2015 10:10 AM 0.83 0.44 - 1.00 mg/dL Final  08/26/2011 01:55 PM 0.68 0.50 - 1.10 mg/dL Final     PMHx:   Past Medical History:  Diagnosis Date  . Arthritis   . Cancer ([place]) 06/2017   right breast cancer  . GERD (gastroesophageal reflux disease)    mild- TUMS used sparingly  . History of radiation  therapy 08/05/18- 09/22/18   Left Parotid 33 fractions for a total of 66 Gy.   Marland Kitchen History of radiation therapy 09/01/2017- 09/29/2017   Right Breast + seroma boost/ total of 50.06 Gy   . Hyperlipidemia   . Hypertension   . PONV (postoperative nausea and vomiting)    also "passed out with cyst removal from neck"superficial"  . Syncope    x2 episodes-evaluated and findings normal- "usually related to seeing blood or thought of it"  . Vaso vagal episode    easily  . Vertigo    She reports several episodes of vertigo. She tells me something black came out of her ear, and she has not had an episode since then.     Past Surgical History:  Procedure Laterality Date  . APPENDECTOMY    . BILIARY  DILATION  07/23/2019   Procedure: BILIARY DILATION;  Surgeon: Rush Landmark Telford Nab., MD;  Location: [facility];  Service: Gastroenterology;;  . BILIARY STENT PLACEMENT N/A 07/23/2019   Procedure: BILIARY STENT PLACEMENT;  Surgeon: Irving Copas., MD;  Location: [facility];  Service: Gastroenterology;  Laterality: N/A;  . BREAST LUMPECTOMY WITH RADIOACTIVE SEED AND SENTINEL LYMPH NODE BIOPSY Right 07/30/2017   Procedure: BREAST LUMPECTOMY WITH RADIOACTIVE SEED AND SENTINEL LYMPH NODE BIOPSY;  Surgeon: [name], MD;  Location: [facility];  Service: General;  Laterality: Right;  . BREAST SURGERY  08/27/11   right lumpectomy w/needle localization  . BUNIONECTOMY Bilateral 2000   bilateral -hardware remains  . CYST REMOVAL NECK     sebaceous  cyst from neck  . DILATION AND CURETTAGE OF UTERUS    . ERCP N/A 07/23/2019   Procedure: ENDOSCOPIC RETROGRADE CHOLANGIOPANCREATOGRAPHY (ERCP);  Surgeon: Irving Copas., MD;  Location: [facility];  Service: Gastroenterology;  Laterality: N/A;  . INTRAUTERINE DEVICE INSERTION    . IUD REMOVAL    . LAPAROSCOPIC APPENDECTOMY N/A 03/27/2017   Procedure: APPENDECTOMY LAPAROSCOPIC;  Surgeon: Alphonsa Overall, MD;  Location: [facility];  Service: General;  Laterality: N/A;  . PANCREATIC STENT PLACEMENT  07/23/2019   Procedure: PANCREATIC STENT PLACEMENT;  Surgeon: Irving Copas., MD;  Location: [facility];  Service: Gastroenterology;;  . Lindell Spar Left 11/28/2017   SUPERFICIAL  . PAROTIDECTOMY Left 11/28/2017   Procedure: LEFT SUPEFICIAL PAROTIDECTOMY WITH OBSERVATION;  Surgeon: Jerrell Belfast, MD;  Location: [facility];  Service: ENT;  Laterality: Left;  . PAROTIDECTOMY Left 06/29/2018   Dr. [facility]icolette [facility] at [facility]. Deep parotidectomy  . Removal Right Straphenous vein  2011  . SPHINCTEROTOMY  07/23/2019   Procedure: SPHINCTEROTOMY;  Surgeon: Mansouraty, Telford Nab., MD;  Location: [facility];  Service:  Gastroenterology;;  . TONSILLECTOMY     child  . TOTAL HIP ARTHROPLASTY Right 11/29/2015   Procedure: RIGHT TOTAL HIP ARTHROPLASTY ANTERIOR APPROACH;  Surgeon: Gaynelle Arabian, MD;  Location: [facility];  Service: Orthopedics;  Laterality: Right;    Family Hx:  Mother with nonalcoholic cirrhosis and deceased at young age 48/2 same. Family History  Problem Relation Age of Onset  . Cancer Mother        benign brain tumor   . Cancer Paternal Aunt        unknown type     Social History:  reports that she quit smoking about 33 years ago. She has a 5.00 pack-year smoking history. She has never used smokeless tobacco. She reports current alcohol use. She reports that she does not use drugs.  Allergies:  Allergies  Allergen Reactions  .  Dermacinrx [facility] [place] Dermatitis    Dermabond caused contact dermatitis   . Iohexol Hives    Patient broke out in hives, needs pre meds if given contrast in the future per Dr. Carlis Abbott // rls     Medications: Prior to Admission medications   Medication Sig Start Date End Date Taking? Authorizing Provider  anastrozole (ARIMIDEX) 1 MG tablet TAKE 1 TABLET(1 MG) BY MOUTH DAILY Patient taking differently: Take 1 mg by mouth daily. TAKE 1 TABLET(1 MG) BY MOUTH DAILY 10/09/18  Yes Truitt Merle, MD  atorvastatin (LIPITOR) 80 MG tablet Take 80 mg by mouth every evening.   Yes [provider]  Calcium-Phosphorus-Vitamin D (CITRACAL +D3 PO) Take 1 tablet by mouth daily.   Yes [provider]  Cholecalciferol (VITAMIN D3) 2000 units TABS Take 2,000 Units by mouth daily.   Yes [provider]  famotidine (PEPCID) 20 MG tablet Take 20 mg by mouth 2 (two) times daily. Morning and bedtime   Yes [provider]  FINACEA 15 % cream Apply 1 application topically daily as needed (rosacea). Applied to nose. 09/13/15  Yes [provider]  LUTEIN-ZEAXANTHIN PO Take 1 tablet by mouth daily.   Yes [provider]  MICARDIS HCT  40-12.5 MG per tablet Take 1 tablet by mouth daily.  06/26/11  Yes [provider]  ZETIA 10 MG tablet Take 10 mg by mouth every evening.  09/16/11  Yes [provider]  diphenhydrAMINE (BENADRYL) 50 MG tablet Take 1 tablet at 1 hour before CT scans. Patient not taking: Reported on 07/06/2019 06/04/19   Eppie Gibson, MD  predniSONE (DELTASONE) 50 MG tablet Take 1 tablet at 13 hours, 7 hours, and 1 hour before CT scans. Patient not taking: Reported on 07/20/2019 06/04/19   Eppie Gibson, MD    I have reviewed the patient's current and reported prior to admission medications.  Labs:  BMP Latest Ref Rng & Units 07/26/2019 07/25/2019 07/24/2019  Glucose 70 - 99 mg/dL 123(H) 138(H) 140(H)  BUN 8 - 23 mg/dL 76(H) 55(H) 33(H)  Creatinine 0.44 - 1.00 mg/dL 2.33(H) 2.23(H) 1.50(H)  Sodium 135 - 145 mmol/L 134(L) 133(L) 135  Potassium 3.5 - 5.1 mmol/L 3.5 3.4(L) 3.9  Chloride 98 - 111 mmol/L 102 102 104  CO2 22 - 32 mmol/L 19(L) 19(L) 21(L)  Calcium 8.9 - 10.3 mg/dL 7.9(L) 7.8(L) 8.2(L)    Urinalysis    Component Value Date/Time   COLORURINE YELLOW 11/22/2015 0905   APPEARANCEUR CLEAR 11/22/2015 0905   LABSPEC 1.017 11/22/2015 0905   PHURINE 6.0 11/22/2015 0905   GLUCOSEU NEGATIVE 11/22/2015 0905   HGBUR MODERATE (A) 11/22/2015 0905   BILIRUBINUR NEGATIVE 11/22/2015 0905   KETONESUR NEGATIVE 11/22/2015 0905   PROTEINUR NEGATIVE 11/22/2015 0905   NITRITE NEGATIVE 11/22/2015 0905   LEUKOCYTESUR SMALL (A) 11/22/2015 0905     ROS:  Pertinent items noted in HPI and remainder of comprehensive ROS otherwise negative.   Physical Exam: Vitals:   07/26/19 0649 07/26/19 1314  BP: (!) 112/56 117/60  Pulse: 78 83  Resp: 17 16  Temp: 98 F (36.7 C) 97.8 F (36.6 C)  SpO2: 97% 96%     General: adult female in bed in NAD at rest  HEENT: Normocephalic atraumatic Eyes: Extraocular movements are intact slight scleral icterus Neck: Supple trachea midline Heart: S1-S2 no rub  appreciated Lungs: Clear to auscultation and unlabored on room air Abdomen: Soft nontender nondistended Extremities: Trace if any edema appreciated no cyanosis or clubbing  Skin: Skin is jaundiced but improved per patient assessment Neuro: Alert and oriented x3 provides a history and follows commands Psych normal mood and affect Genitourinary Foley in place with dark, tea-colored urine  Assessment/Plan:  # AKI  -May be secondary to ischemic and prerenal insults and hopeful for  potential plateau.  On micardis HCTZ at home and hypotensive off meds here on occasion.  Note also recent history of Zosyn.  No hydro and some echogenicity noted on renal ultrasound  - UA and up/cr ratio are ordered  -Would remain off of ARB and HCTZ - Strict ins/outs - Discontinue fluids  - Continue foley for now - Continue supportive care - Please transition off of morphine for pain given her AKI (metabolites accumulate)  # HTN - Hypotension noted here - remain off of micardis-HCTZ   # Hyponatremia - improved She would not need to resume her HCTZ as hyponatremia is a contraindication  # Obstructive jaundice  - s/p ERCP and stent placement on 5/7 - Per GI  # Breast cancer with metastasis  - Per primary team and oncology   # metabolic acidosis - noted; has been on LR  - start sodium bicarbonate 650 mg BID for now and follow trends for taper up/down  Claudia Desanctis 07/26/2019, 4:01 PM

## 2019-07-26 NOTE — TOC Initial Note (Signed)
Transition of Care [facility]) - Initial/Assessment Note    Patient Details  Name: [name] MRN: [id number] Date of Birth: 09/15/1945  [facility] CM/SW Contact:    Lennart Pall, LCSW Phone Number: 07/26/2019, 11:00 AM  Clinical Narrative:   Met with pt to review potential d/c needs.  Pt aware that referral has been place to CIR and we await MD decision.  She admits frustration with her loss of mobility "I just can't believe I can't even walk now."  Reports good support from her spouse and adult children, however, spouse with recent shoulder surgery and children all "have families and are busy."  She notes that her 1st choice is most definitely CIR and, if this is not an option, would need short term SNF.  Continue to follow.                Expected Discharge Plan: IP Rehab Facility(vs SNF) Barriers to Discharge: Continued Medical Work up   Patient Goals and CMS Choice Patient states their goals for this hospitalization and ongoing recovery are:: Hopes to be able to get into CIR      Expected Discharge Plan and Services Expected Discharge Plan: IP Rehab Facility(vs SNF) In-house Referral: Clinical Social Work     Living arrangements for the past 2 months: Single Family Home                                      Prior Living Arrangements/Services Living arrangements for the past 2 months: Single Family Home Lives with:: Spouse Patient language and need for interpreter reviewed:: Yes Do you feel safe going back to the place where you live?: Yes(after completion of rehab)      Need for Family Participation in Patient Care: Yes (Comment) Care giver support system in place?: Yes (comment)   Criminal Activity/Legal Involvement Pertinent to Current Situation/Hospitalization: No - Comment as needed  Activities of Daily Living Home Assistive Devices/Equipment: Eyeglasses(reading glasses) ADL Screening (condition at time of admission) Patient's cognitive ability  adequate to safely complete daily activities?: Yes Is the patient deaf or have difficulty hearing?: No Does the patient have difficulty seeing, even when wearing glasses/contacts?: No Does the patient have difficulty concentrating, remembering, or making decisions?: No Patient able to express need for assistance with ADLs?: Yes Does the patient have difficulty dressing or bathing?: No Independently performs ADLs?: Yes (appropriate for developmental age) Does the patient have difficulty walking or climbing stairs?: Yes Weakness of Legs: Both Weakness of Arms/Hands: Both  Permission Sought/Granted Permission sought to share information with : Family Supports, Chartered certified accountant granted to share information with : Yes, Verbal Permission Granted  Share Information with NAME: Tabitha Riggins     Permission granted to share info w Relationship: spouse  Permission granted to share info w Contact Information: [phone]  Emotional Assessment Appearance:: Appears stated age Attitude/Demeanor/Rapport: Engaged, Gracious Affect (typically observed): Stable, Pleasant Orientation: : Oriented to Self, Oriented to Place, Oriented to  Time, Oriented to Situation Alcohol / Substance Use: Not Applicable Psych Involvement: No (comment)  Admission diagnosis:  Hyperbilirubinemia [E80.6] Bilirubinemia [E80.6] Liver mass [R16.0] Obstructive jaundice [K83.1] Patient Active Problem List   Diagnosis Date Noted  . 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 Nodule on liver 07/20/2019  . Hypokalemia 07/20/2019  . Hyponatremia 07/20/2019  . Transaminitis 07/20/2019  . Hypotension 07/20/2019  . Elevated bilirubin 07/20/2019  . 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 Parotid mass 11/28/2017  . Malignant neoplasm of lower-inner quadrant of right breast of female, estrogen receptor positive ([place]) 07/22/2017  . Appendicitis with perforation  03/27/2017  . OA (osteoarthritis) of hip 11/29/2015   PCP:  [name], MD Pharmacy:   [facility] [address] [place] - [place] AT [place] [address] [place] [address] Phone: [phone] Fax: [phone]     Social Determinants of Health (SDOH) Interventions    Readmission Risk Interventions No flowsheet data found.

## 2019-07-26 NOTE — Care Management Important Message (Signed)
Important Message  Patient Details IM Letter given to [facility] Case Manager to present to the Patient Name: [name] MRN: [id number] Date of Birth: [birth year]   Medicare Important Message Given:  Yes     Kerin Salen 07/26/2019, 1:16 PM

## 2019-07-26 NOTE — Progress Notes (Addendum)
[name]   DOB:1945-10-29   IR#:[id number]   JKK#:[id number]  Oncology follow up   Subjective: Still reports being weak. States she has not been able to walk. Denies abdominal pain, nausea, vomiting.   Objective:  Vitals:   07/25/19 2008 07/26/19 0649  BP: (!) 120/55 (!) 112/56  Pulse: 77 78  Resp: 16 17  Temp: 97.6 F (36.4 C) 98 F (36.7 C)  SpO2: 96% 97%    Body mass index is 28.29 kg/m.  Intake/Output Summary (Last 24 hours) at 07/26/2019 0935 Last data filed at 07/26/2019 0649 Gross per 24 hour  Intake 3145.72 ml  Output 830 ml  Net 2315.72 ml     Sclerae icteric  Oropharynx clear  No peripheral adenopathy  Lungs clear -- no rales or rhonchi  Heart regular rate and rhythm  Abdomen soft nontender   MSK no focal spinal tenderness, no peripheral edema  Neuro nonfocal    CBG (last 3)  No results for input(s): GLUCAP in the last 72 hours.   Labs:  Urine Studies No results for input(s): UHGB, CRYS in the last 72 hours.  Invalid input(s): UACOL, UAPR, USPG, UPH, UTP, UGL, UKET, UBIL, UNIT, UROB, ULEU, UEPI, UWBC, URBC, UBAC, CAST, [place], [place]  Basic Metabolic Panel: Recent Labs  Lab 07/21/19 [phone] 07/21/19 6789 07/22/19 0451 07/22/19 0451 07/23/19 0423 07/23/19 0423 07/24/19 0527 07/24/19 0527 07/25/19 0414 07/26/19 0327  NA 132*   < > 134*  --  134*  --  135  --  133* 134*  K 3.0*   < > 3.9   < > 3.1*   < > 3.9   < > 3.4* 3.5  CL 99   < > 101  --  103  --  104  --  102 102  CO2 23   < > 22  --  21*  --  21*  --  19* 19*  GLUCOSE 95   < > 85  --  92  --  140*  --  138* 123*  BUN 37*   < > 33*  --  24*  --  33*  --  55* 76*  CREATININE 1.35*   < > 1.44*  --  1.16*  --  1.50*  --  2.23* 2.33*  CALCIUM 7.3*   < > 7.7*  --  8.2*  --  8.2*  --  7.8* 7.9*  MG 2.5*  --  2.5*  --   --   --  2.5*  --   --   --    < > = values in this interval not displayed.   GFR Estimated Creatinine Clearance: 21.4 mL/min (A) (by C-G formula based on SCr of 2.33 mg/dL  (H)). Liver Function Tests: Recent Labs  Lab 07/22/19 0451 07/23/19 0423 07/24/19 0527 07/25/19 0414 07/26/19 0327  AST 972* 841* 704* 613* 626*  ALT 331* 320* 274* 266* 269*  ALKPHOS 1,108* 1,064* 981* 824* 727*  BILITOT 19.3* 20.2* 18.4* 16.6* 16.5*  PROT 5.3* 5.0* 4.7* 4.6* 4.4*  ALBUMIN 2.2* 2.0* 2.0* 1.7* 1.8*   Recent Labs  Lab 07/20/19 1511 07/24/19 0527  LIPASE 143* 105*   No results for input(s): AMMONIA in the last 168 hours. Coagulation profile Recent Labs  Lab 07/20/19 1130 07/21/19 0437 07/23/19 0423  INR 1.2 1.4* 1.6*    CBC: Recent Labs  Lab 07/20/19 1130 07/20/19 1130 07/20/19 1958 07/22/19 0451 07/23/19 0423 07/24/19 0527 07/25/19 0414  WBC 8.8   < >  7.8 7.6 7.3 8.9 12.6*  NEUTROABS 7.4  --   --  5.9 5.7 8.1* 11.2*  HGB 15.1*   < > 13.8 14.5 14.7 13.6 12.5  HCT 42.6   < > 39.3 41.1 43.0 39.4 36.4  MCV 89.3   < > 89.1 88.4 89.8 90.8 91.0  PLT 207   < > 185 178 188 183 202   < > = values in this interval not displayed.   Cardiac Enzymes: No results for input(s): CKTOTAL, CKMB, CKMBINDEX, TROPONINI in the last 168 hours. BNP: Invalid input(s): POCBNP CBG: No results for input(s): GLUCAP in the last 168 hours. D-Dimer No results for input(s): DDIMER in the last 72 hours. Hgb A1c No results for input(s): HGBA1C in the last 72 hours. Lipid Profile No results for input(s): CHOL, HDL, LDLCALC, TRIG, CHOLHDL, LDLDIRECT in the last 72 hours. Thyroid function studies No results for input(s): TSH, T4TOTAL, T3FREE, THYROIDAB in the last 72 hours.  Invalid input(s): FREET3 Anemia work up No results for input(s): VITAMINB12, FOLATE, FERRITIN, TIBC, IRON, RETICCTPCT in the last 72 hours. Microbiology Recent Results (from the past 240 hour(s))  Respiratory Panel by RT PCR (Flu A&B, Covid) - Nasopharyngeal Swab     Status: None   Collection Time: 07/20/19  3:21 PM   Specimen: Nasopharyngeal Swab  Result Value Ref Range Status   SARS Coronavirus  2 by RT PCR NEGATIVE NEGATIVE Final    Comment: (NOTE) SARS-CoV-2 target nucleic acids are NOT DETECTED. The SARS-CoV-2 RNA is generally detectable in upper respiratoy specimens during the acute phase of infection. The lowest concentration of SARS-CoV-2 viral copies this assay can detect is 131 copies/mL. A negative result does not preclude SARS-Cov-2 infection and should not be used as the sole basis for treatment or other patient management decisions. A negative result may occur with  improper specimen collection/handling, submission of specimen other than nasopharyngeal swab, presence of viral mutation(s) within the areas targeted by this assay, and inadequate number of viral copies (<131 copies/mL). A negative result must be combined with clinical observations, patient history, and epidemiological information. The expected result is Negative. Fact Sheet for Patients:  PinkCheek.be Fact Sheet for Healthcare Providers:  GravelBags.it This test is not yet ap proved or cleared by the [place] FDA and  has been authorized for detection and/or diagnosis of SARS-CoV-2 by FDA under an Emergency Use Authorization (EUA). This EUA will remain  in effect (meaning this test can be used) for the duration of the COVID-19 declaration under Section 564(b)(1) of the Act, 21 U.S.C. section 360bbb-3(b)(1), unless the authorization is terminated or revoked sooner.    Influenza A by PCR NEGATIVE NEGATIVE Final   Influenza B by PCR NEGATIVE NEGATIVE Final    Comment: (NOTE) The Xpert Xpress SARS-CoV-2/FLU/RSV assay is intended as an aid in  the diagnosis of influenza from Nasopharyngeal swab specimens and  should not be used as a sole basis for treatment. Nasal washings and  aspirates are unacceptable for Xpert Xpress SARS-CoV-2/FLU/RSV  testing. Fact Sheet for Patients: PinkCheek.be Fact Sheet for Healthcare  Providers: GravelBags.it This test is not yet approved or cleared by the [place] FDA and  has been authorized for detection and/or diagnosis of SARS-CoV-2 by  FDA under an Emergency Use Authorization (EUA). This EUA will remain  in effect (meaning this test can be used) for the duration of the  Covid-19 declaration under Section 564(b)(1) of the Act, 21  U.S.C. section 360bbb-3(b)(1), unless the authorization is  terminated or revoked. Performed at [facility], [place] [address] [place], [place] [address]       Studies:  CT HEAD WO CONTRAST  Result Date: 07/25/2019 CLINICAL DATA:  Left leg weakness. Focal neurologic deficit greater than 6 hours. EXAM: CT HEAD WITHOUT CONTRAST TECHNIQUE: Contiguous axial images were obtained from the base of the skull through the vertex without intravenous contrast. COMPARISON:  Overlapping portions of CT neck from 07/05/2019 FINDINGS: Brain: Small remote lacunar infarct or focus of chronic ischemic microvascular white matter disease along the anterior limb of the left internal capsule on image 14/2. Periventricular white matter and corona radiata hypodensities favor chronic ischemic microvascular white matter disease. Otherwise, the brainstem, cerebellum, cerebral peduncles, thalamus, basal ganglia, basilar cisterns, and ventricular system appear within normal limits. No intracranial hemorrhage, mass lesion, or acute CVA. Vascular: There is atherosclerotic calcification of the cavernous carotid arteries bilaterally. Skull: Unremarkable Sinuses/Orbits: Unremarkable Other: No supplemental non-categorized findings. IMPRESSION: 1. No acute intracranial findings. 2. Periventricular white matter and corona radiata hypodensities favor chronic ischemic microvascular white matter disease. 3. Small remote lacunar infarct or focus of chronic ischemic microvascular white matter disease along the anterior limb of the left  internal capsule. 4. Atherosclerosis. Electronically Signed   By: Van Clines M.D.   On: 07/25/2019 13:48    Assessment: 74 y.o. Caucasian female, with past medical history of breast cancer and parotid gland carcinoma  1. 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  AKI with hyponatremia and hypokalemia 3.  History of stage I right breast cancer in 2019, on adjuvant anastrozole 4.  History of parotid gland carcinoma in 2020, status post surgery and radiation 5. HTN  6.  Deconditioning  Plan:  -Discussed prognostic profile with the patient.  We will consider her for Taxol, Perjeta, Herceptin as an outpatient. -Liver function stable, monitor closely -Renal function slightly worsened, continue to monitor renal function closely, continue hydration, avoid nephrotoxic medications. -Discussed inpatient rehab versus SNF with the patient to improve her functional status.  She is thinking about this and is interested in pursuing rehab. -Outpatient follow-up has been scheduled for the patient on 08/05/2019.  Mikey Bussing, NP 07/26/2019    Addendum  I have seen the patient, examined her. I agree with the assessment and and plan and have edited the notes.   I discussed her final biopsy path result, including ER/PR/HER2, which is different from her initial breast cancer, if sure if she has a new breast primary or her recurrent breast cancer has changed the biopsy of the tumor. I discussed the treatment option, which will include chemo and antiher2 antibodies which we have many options. Unfortunately she has both liver and renal dysfunction now, and her PS is very low. I again encouraged her to focus on diet and exercise, hopefully she will improve, so we can consider systemic therapy for her. I will f/u her as needed before her discharge and arrange her outpt appointments.   Truitt Merle  07/26/2019

## 2019-07-26 NOTE — PMR Pre-admission (Incomplete)
PMR Admission Coordinator Pre-Admission Assessment ? ?Patient: [name] is an 74 y.o., female ?MRN: [id number] ?DOB: 06-Apr-1945 ?Height: 5\' 5"  (165.1 cm) ?Weight: 77.1 kg ? ?Insurance Information ?HMO: ***    PPO: ***     PCP:      IPA:      80/20:      OTHER:  ?PRIMARY: [employer]      Policy#: [id number]      Subscriber: pt ?CM Name: ***      Phone#: [phone] (***)     Fax#: [phone] ?Pre-Cert#: ***      Employer:  ?Benefits:  Phone #: [phone]     Name: n/a ?Eff. Date: ***     Deduct: ***      Out of Pocket Max: ***      Life Max: n/a ?CIR: ***      SNF: 20 full days ?Outpatient:      Co-Pay: *** ?Home Health: 100%      Co-Pay:  ?DME: 80%     Co-Pay: 20% ?Providers:  ?SECONDARY:Tricare for Life       Policy#: [id number]     Phone#: Tinsleigh Larance ? ?Financial Counselor:       Phone#:  ? ?The ?Data Collection Information Summary? for patients in Inpatient Rehabilitation Facilities with attached ?Privacy Act [place] Records? was provided and verbally reviewed with: Patient and Family ? ?Emergency Contact Information ?Contact Information   ? Name Relation Home Work Mobile  ? Annia, Soloff [id number]  [phone]  ?  ? ? ?Current Medical History  ?Patient Admitting Diagnosis: debility due to breast cancer with obstructive mets to liver ? ? History of Present Illness: Pt is a 74 y/o female with history of breast cancer, hypertension, and vertigo and noted to have new liver lesions on surveillance CT chest, who [facility] after outpatient labs demonstrated transaminitis with elevated bili, hyponatremia, and hypokalemia.  C/o progressive weakness x1 week after US guided liver biopsy.  Pt endorses 7 day history of skin discoloration, nausea, vomiting, and decreased appetite.  Pt was gently hydrated and has had close hemodynamic monitoring while admitted.  GI consult placed and MRCP on 5/4 showed widespread metastatic disease to the liver.  ERCP on 5/7 revealed severe biliary strictuing and pt now s

## 2019-07-26 NOTE — Plan of Care (Signed)

## 2019-07-27 ENCOUNTER — Inpatient Hospital Stay (HOSPITAL_COMMUNITY): Payer: Medicare PPO

## 2019-07-27 DIAGNOSIS — G8220 Paraplegia, unspecified: Secondary | ICD-10-CM

## 2019-07-27 LAB — URINALYSIS, COMPLETE (UACMP) WITH MICROSCOPIC
Bacteria, UA: NONE SEEN
RBC / HPF: >50 RBC/hpf — ABNORMAL HIGH (ref 0–5)

## 2019-07-27 LAB — COMPREHENSIVE METABOLIC PANEL
ALT: 273 U/L — ABNORMAL HIGH (ref 0–44)
AST: 652 U/L — ABNORMAL HIGH (ref 15–41)
Albumin: 1.9 g/dL — ABNORMAL LOW (ref 3.5–5.0)
Alkaline Phosphatase: 679 U/L — ABNORMAL HIGH (ref 38–126)
Anion gap: 13 (ref 5–15)
BUN: 93 mg/dL — ABNORMAL HIGH (ref 8–23)
CO2: 19 mmol/L — ABNORMAL LOW (ref 22–32)
Calcium: 8.2 mg/dL — ABNORMAL LOW (ref 8.9–10.3)
Chloride: 99 mmol/L (ref 98–111)
Creatinine, Ser: 2.36 mg/dL — ABNORMAL HIGH (ref 0.44–1.00)
GFR calc Af Amer: 23 mL/min — ABNORMAL LOW (ref >60)
GFR calc non Af Amer: 19 mL/min — ABNORMAL LOW (ref >60)
Glucose, Bld: 99 mg/dL (ref 70–99)
Potassium: 4 mmol/L (ref 3.5–5.1)
Sodium: 131 mmol/L — ABNORMAL LOW (ref 135–145)
Total Bilirubin: 17.3 mg/dL — ABNORMAL HIGH (ref 0.3–1.2)
Total Protein: 5.1 g/dL — ABNORMAL LOW (ref 6.5–8.1)

## 2019-07-27 MED ORDER — BOOST / RESOURCE BREEZE PO LIQD CUSTOM
1.0000 | Freq: Two times a day (BID) | ORAL | Status: DC
  Administered 2019-07-27 – 2019-08-06 (×19): 1 via ORAL

## 2019-07-27 MED ORDER — SODIUM BICARBONATE 650 MG PO TABS
650.0000 mg | ORAL_TABLET | Freq: Three times a day (TID) | ORAL | Status: DC
  Administered 2019-07-27 – 2019-08-06 (×31): 650 mg via ORAL
  Filled 2019-07-27 (×32): qty 1

## 2019-07-27 MED ORDER — SODIUM CHLORIDE 0.9 % IV SOLN: INTRAVENOUS | Status: AC

## 2019-07-27 NOTE — Progress Notes (Signed)
Inpatient Rehab Admissions:  Inpatient Rehab Consult received.  I spoke with pt and her husband over the phone for rehabilitation assessment and to discuss goals and expectations of an inpatient rehab admission.  They are both interested in CIR; pt would need to be supervision level or better to safely discharge home with her spouse.  I will open insurance request for authorization and continue to follow for possible admission.   Signed: Shann Medal, PT, DPT Admissions Coordinator [phone] 07/27/19  11:15 AM

## 2019-07-27 NOTE — NC FL2 (Signed)
[place] LEVEL OF CARE SCREENING TOOL     IDENTIFICATION  Patient Name: [name] Birthdate: 07-04-1945 Sex: female Admission Date (Current Location): 07/20/2019  [facility] and [place] Number:  Herbalist and Address:  [facility],  [place] [address] [place]      Provider Number: [id number]  Attending Physician Name and Address:  Guilford Shi, MD  Relative Name and Phone Number:  Lorilee Waldie @ [phone]    Current Level of Care: Hospital Recommended Level of Care: [place] Prior Approval Number:    Date Approved/Denied:   PASRR Number:    Discharge Plan: SNF    Current Diagnoses: Patient Active Problem List   Diagnosis Date Noted  . 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 Nodule on liver 07/20/2019  . Hypokalemia 07/20/2019  . Hyponatremia 07/20/2019  . Transaminitis 07/20/2019  . Hypotension 07/20/2019  . Elevated bilirubin 07/20/2019  . Cancer of parotid gland ([place]) 07/24/2018  . 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 Parotid mass 11/28/2017  . Malignant neoplasm of lower-inner quadrant of right breast of female, estrogen receptor positive ([place]) 07/22/2017  . Appendicitis with perforation 03/27/2017  . OA (osteoarthritis) of hip 11/29/2015    Orientation RESPIRATION BLADDER Height & Weight     Self, Time, Situation, Place  Normal Continent, External catheter(purewick catheter due to weakness) Weight: 170 lb (77.1 kg) Height:  5\' 5"  (165.1 cm)  BEHAVIORAL SYMPTOMS/MOOD NEUROLOGICAL BOWEL NUTRITION STATUS      Continent    AMBULATORY STATUS COMMUNICATION OF NEEDS Skin   Extensive Assist Verbally Normal                       Personal Care Assistance Level of Assistance  Bathing, Dressing Bathing Assistance: Limited assistance   Dressing Assistance: Limited assistance     Functional Limitations Info             SPECIAL CARE FACTORS FREQUENCY  PT (By licensed PT), OT  (By licensed OT)     PT Frequency: 5x/wk OT Frequency: 5x/wk            Contractures Contractures Info: Not present    Additional Factors Info  Code Status, Allergies Code Status Info: Full Allergies Info: see MAR           Current Medications (07/27/2019):  This is the current hospital active medication list Current Facility-Administered Medications  Medication Dose Route Frequency Provider Last Rate Last Admin  . anastrozole (ARIMIDEX) tablet 1 mg  1 mg Oral Daily [place] Donnamarie Poag, DO   1 mg at 07/27/19 [id number]  . Chlorhexidine Gluconate Cloth 2 % PADS 6 each  6 each Topical Daily Arrien, Jimmy Picket, MD   6 each at 07/27/19 [phone]  . enoxaparin (LOVENOX) injection 30 mg  30 mg Subcutaneous Daily Arrien, Jimmy Picket, MD   30 mg at 07/27/19 0920  . famotidine (PEPCID) tablet 20 mg  20 mg Oral Daily Arrien, Jimmy Picket, MD   20 mg at 07/27/19 0921  . feeding supplement (BOOST / RESOURCE BREEZE) liquid 1 Container  1 Container Oral BID BM Guilford Shi, MD      . ondansetron (ZOFRAN) tablet 4 mg  4 mg Oral Q6H PRN [place] Eric J, DO       Or  . ondansetron [facility]) injection 4 mg  4 mg Intravenous Q6H PRN [place] Eric J, DO      . oxyCODONE (Oxy IR/ROXICODONE) immediate release tablet  5 mg  5 mg Oral Q4H PRN [place] Eric J, DO      . senna-docusate (Senokot-S) tablet 1 tablet  1 tablet Oral QHS PRN [place] Donnamarie Poag, DO      . sodium bicarbonate tablet 650 mg  650 mg Oral BID Claudia Desanctis, MD   650 mg at 07/27/19 [id number]     Discharge Medications: Please see discharge summary for a list of discharge medications.  Relevant Imaging Results:  Relevant Lab Results:   Additional Information    Arcenio Mullaly, LCSW

## 2019-07-27 NOTE — Progress Notes (Signed)
Nutrition Follow-up  DOCUMENTATION CODES:   Not applicable  INTERVENTION:  - continue Boost Breeze but will decrease from TID to BID.    NUTRITION DIAGNOSIS:   Increased nutrient needs related to acute illness as evidenced by estimated needs. -revised  GOAL:   Patient will meet greater than or equal to 90% of their needs -met on average  MONITOR:   PO intake, Supplement acceptance, Labs, Weight trends  ASSESSMENT:   74 year old female with past medical history of HTN, HLD, history of breast/parotid cancer, GERD, admitted for obstructive juandice after presenting from IR (where she was scheduled for guided biopsy of liver lesion found on 4/19 CT chest during parotid cancer screening) with concerns of electrolyte disturbance and reports skin discoloration onset 7 days ago associated with some N/V, decreased appetite, dizziness, weakness, and fatigue that has been progressing over the last month.  Diet advanced from CLD to Heart Healthy on 5/8 at 0605 and patient has been eating 50-100% of meals since that time. She has been accepting Boost Breeze 50-75% of the time offered. She has not been weighed since admission on 5/4.  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 Medications reviewed; 20 mg oral pepcid/day, 650 mg sodium bicarb BID.    Diet Order:   Diet Order            Diet Heart Room service appropriate? Yes; Fluid consistency: Thin  Diet effective now              EDUCATION NEEDS:   No education needs have been identified at this time  Skin:  Skin Assessment: Reviewed RN Assessment(juandice)  Last BM:  5/7  Height:   Ht  Readings from Last 1 Encounters:  07/23/19 _0  (1.651 m)    Weight:   Wt Readings from Last 1 Encounters:  07/23/19 77.1 kg    Ideal Body Weight:     BMI:  Body mass index is 28.29 kg/m.  Estimated Nutritional Needs:   Kcal:  2000-2200  Protein:  100-110  Fluid:  > 1.9 L/day     Jarome Matin, MS, RD, LDN, CNSC Inpatient Clinical Dietitian RD pager # available in [facility]  After hours/weekend pager # available in [facility]

## 2019-07-27 NOTE — Progress Notes (Signed)
PROGRESS NOTE    [name]  VZC:[id number]  DOB: Jan 19, 1946  PCP: [name], MD Admit date:07/20/2019 74 year old female with  history for hypertension, dyslipidemia, breast/parotid cancer and GERD who was recently noted to have new  liver lesions on surveillance CT chest  presented with hypotension after a ultrasound-guided liver biopsy by IR on the day of admission.Patient reported about 7 days of skin discoloration, associated with nausea, vomiting, decreased appetite and generalized weakness. Her preoperative studies showed hyponatremia, hypokalemia, hyperbilirubinemia and elevated liver enzymes.  ED Course: Afebrile. After ultrasound-guided biopsy,her blood pressure was 66/36, with IV fluids improved to 101/55, heart rate 73, respiratory rate 16, temperature 97.9, oxygen saturation 99%.WBC count 8.8, hemoglobin 15.1, platelet 207.  Sodium 129, potassium 2.6, chloride 87, CO2 28, BUN 35, creatinine 1.31, glucose 135.  Total bilirubin 22.5, AST 1193, ALT 462.  Patient was given 2 L NS bolus, Benadryl.  Hospital course: Patient admitted to [facility] for further evaluation and management,placed on intravenous fluids, close hemodynamic monitoring and LB GI consult placed. MRCP 5/4 with widespread metastatic disease to the liver. Dominant index lesion in the right hepatic lobe 4.0 x 3.0 cm. Intrahepatic biliary dilatation with central truncation of the biliary tree.Liver biopsy positive for metastatic breast cancer. ERCP on 5/7 revealed severe biliary stricturing, at the middle and upper third of the main bile duct, dilation of the right and left intrahepatic ducts. Now s/p biliary sphincterectomy/ sphincterectoplasty. Plastic biliary stents were placed at the right, left hepatic ducts and at the ventral pancreatic duct.Patient will need radiographic follow up as outpatient. Patient very weak and deconditioned, PT/OT recommendation for CIR.   Subjective: Patient reports worsening lower extremity  weakness/heaviness.  Seen by renal yesterday and now off IV fluids.  Remains on Foley catheter--wants to continue until she is more mobile.  Objective: Vitals:   07/26/19 1314 07/26/19 2125 07/27/19 0517 07/27/19 1411  BP: 117/60 (!) 112/57 (!) 104/59 (!) 106/54  Pulse: 83 92 83 83  Resp: '16 16 17 17  ' Temp: 97.8 F (36.6 C) 98.4 F (36.9 C) (!) 97.5 F (36.4 C) 97.7 F (36.5 C)  TempSrc:  Oral Oral   SpO2: 96% 95% 94% 98%  Weight:      Height:        Intake/Output Summary (Last 24 hours) at 07/27/2019 1446 Last data filed at 07/27/2019 0930 Gross per 24 hour  Intake 591.44 ml  Output 1400 ml  Net -808.56 ml   Filed Weights   07/20/19 2051 07/23/19 0653  Weight: 77.1 kg 77.1 kg    Physical Examination:  General exam: Appears weak and tired, icteric.  No acute distress noted Respiratory system: Clear to auscultation. Respiratory effort normal. Cardiovascular system: S1 & S2 heard, RRR. No JVD, murmurs, rubs, gallops or clicks. No pedal edema. Gastrointestinal system: Abdomen is nondistended, soft and nontender. Normal bowel sounds heard. Central nervous system: Alert and oriented.  Patient able to move her toes and move her legs along the bed but unable to lift against gravity (2/5).  Able to move upper extremities well.  Sensations to crude touch intact in all extremities.  No facial droop.  No clonus. Extremities: No significant edema or contractures but lower extremity weakness as noted above Skin: No rashes, lesions or ulcers Psychiatry: Judgement and insight appear normal. Mood & affect appropriate.   Data Reviewed: I have personally reviewed following labs and imaging studies  CBC: Recent Labs  Lab 07/20/19 1958 07/22/19 0451 07/23/19 0423 07/24/19 1287 07/25/19 8676  WBC 7.8 7.6 7.3 8.9 12.6*  NEUTROABS  --  5.9 5.7 8.1* 11.2*  HGB 13.8 14.5 14.7 13.6 12.5  HCT 39.3 41.1 43.0 39.4 36.4  MCV 89.1 88.4 89.8 90.8 91.0  PLT 185 178 188 183 161   Basic  Metabolic Panel: Recent Labs  Lab 07/21/19 0437 07/21/19 0437 07/22/19 0451 07/22/19 0451 07/23/19 0423 07/24/19 0527 07/25/19 0414 07/26/19 0327 07/27/19 0942  NA 132*   < > 134*   < > 134* 135 133* 134* 131*  K 3.0*   < > 3.9   < > 3.1* 3.9 3.4* 3.5 4.0  CL 99   < > 101   < > 103 104 102 102 99  CO2 23   < > 22   < > 21* 21* 19* 19* 19*  GLUCOSE 95   < > 85   < > 92 140* 138* 123* 99  BUN 37*   < > 33*   < > 24* 33* 55* 76* 93*  CREATININE 1.35*   < > 1.44*   < > 1.16* 1.50* 2.23* 2.33* 2.36*  CALCIUM 7.3*   < > 7.7*   < > 8.2* 8.2* 7.8* 7.9* 8.2*  MG 2.5*  --  2.5*  --   --  2.5*  --   --   --    < > = values in this interval not displayed.   GFR: Estimated Creatinine Clearance: 21.1 mL/min (A) (by C-G formula based on SCr of 2.36 mg/dL (H)). Liver Function Tests: Recent Labs  Lab 07/23/19 0423 07/24/19 0527 07/25/19 0414 07/26/19 0327 07/27/19 0942  AST 841* 704* 613* 626* 652*  ALT 320* 274* 266* 269* 273*  ALKPHOS 1,064* 981* 824* 727* 679*  BILITOT 20.2* 18.4* 16.6* 16.5* 17.3*  PROT 5.0* 4.7* 4.6* 4.4* 5.1*  ALBUMIN 2.0* 2.0* 1.7* 1.8* 1.9*   Recent Labs  Lab 07/20/19 1511 07/24/19 0527  LIPASE 143* 105*   No results for input(s): AMMONIA in the last 168 hours. Coagulation Profile: Recent Labs  Lab 07/21/19 0437 07/23/19 0423  INR 1.4* 1.6*   Cardiac Enzymes: No results for input(s): CKTOTAL, CKMB, CKMBINDEX, TROPONINI in the last 168 hours. BNP (last 3 results) No results for input(s): PROBNP in the last 8760 hours. HbA1C: No results for input(s): HGBA1C in the last 72 hours. CBG: No results for input(s): GLUCAP in the last 168 hours. Lipid Profile: No results for input(s): CHOL, HDL, LDLCALC, TRIG, CHOLHDL, LDLDIRECT in the last 72 hours. Thyroid Function Tests: No results for input(s): TSH, T4TOTAL, FREET4, T3FREE, THYROIDAB in the last 72 hours. Anemia Panel: No results for input(s): VITAMINB12, FOLATE, FERRITIN, TIBC, IRON, RETICCTPCT in  the last 72 hours. Sepsis Labs: Recent Labs  Lab 07/20/19 1511  LATICACIDVEN 1.6    Recent Results (from the past 240 hour(s))  Respiratory Panel by RT PCR (Flu A&B, Covid) - Nasopharyngeal Swab     Status: None   Collection Time: 07/20/19  3:21 PM   Specimen: Nasopharyngeal Swab  Result Value Ref Range Status   SARS Coronavirus 2 by RT PCR NEGATIVE NEGATIVE Final    Comment: (NOTE) SARS-CoV-2 target nucleic acids are NOT DETECTED. The SARS-CoV-2 RNA is generally detectable in upper respiratoy specimens during the acute phase of infection. The lowest concentration of SARS-CoV-2 viral copies this assay can detect is 131 copies/mL. A negative result does not preclude SARS-Cov-2 infection and should not be used as the sole basis for treatment or other patient management decisions. A  negative result may occur with  improper specimen collection/handling, submission of specimen other than nasopharyngeal swab, presence of viral mutation(s) within the areas targeted by this assay, and inadequate number of viral copies (<131 copies/mL). A negative result must be combined with clinical observations, patient history, and epidemiological information. The expected result is Negative. Fact Sheet for Patients:  PinkCheek.be Fact Sheet for Healthcare Providers:  GravelBags.it This test is not yet ap proved or cleared by the [place] FDA and  has been authorized for detection and/or diagnosis of SARS-CoV-2 by FDA under an Emergency Use Authorization (EUA). This EUA will remain  in effect (meaning this test can be used) for the duration of the COVID-19 declaration under Section 564(b)(1) of the Act, 21 U.S.C. section 360bbb-3(b)(1), unless the authorization is terminated or revoked sooner.    Influenza A by PCR NEGATIVE NEGATIVE Final   Influenza B by PCR NEGATIVE NEGATIVE Final    Comment: (NOTE) The Xpert Xpress  SARS-CoV-2/FLU/RSV assay is intended as an aid in  the diagnosis of influenza from Nasopharyngeal swab specimens and  should not be used as a sole basis for treatment. Nasal washings and  aspirates are unacceptable for Xpert Xpress SARS-CoV-2/FLU/RSV  testing. Fact Sheet for Patients: PinkCheek.be Fact Sheet for Healthcare Providers: GravelBags.it This test is not yet approved or cleared by the [place] FDA and  has been authorized for detection and/or diagnosis of SARS-CoV-2 by  FDA under an Emergency Use Authorization (EUA). This EUA will remain  in effect (meaning this test can be used) for the duration of the  Covid-19 declaration under Section 564(b)(1) of the Act, 21  U.S.C. section 360bbb-3(b)(1), unless the authorization is  terminated or revoked. Performed at [facility], [place] [address] [place], [place] [address]       Radiology Studies: US RENAL  Result Date: 07/26/2019 CLINICAL DATA:  74 year old female with acute renal insufficiency. Recent biliary stent placement. EXAM: RENAL / URINARY TRACT ULTRASOUND COMPLETE COMPARISON:  Abdomen MRI 07/20/2019. FINDINGS: Right Kidney: Renal measurements: 11.8 x 4.9 x 5.5 cm = volume: 166 mL. Echogenic right renal cortex compared to liver (image 5). But no right hydronephrosis or right renal lesion. Left Kidney: Renal measurements: 13.8 x 6.3 x 5.3 cm = volume: 240 mL. The left renal cortex also appears partially echogenic (image 27). No left hydronephrosis or left renal lesion. Bladder: Fairly decompressed with a Foley catheter balloon visible on image 44. Other: Bilateral pleural fluid is evident. A small volume of ascites is visible in the right abdomen. IMPRESSION: No acute renal findings.  Evidence of chronic medical renal disease. Electronically Signed   By: Genevie Ann M.D.   On: 07/26/2019 14:58        Scheduled Meds: . anastrozole  1 mg Oral Daily   . Chlorhexidine Gluconate Cloth  6 each Topical Daily  . enoxaparin (LOVENOX) injection  30 mg Subcutaneous Daily  . famotidine  20 mg Oral Daily  . feeding supplement  1 Container Oral BID BM  . sodium bicarbonate  650 mg Oral TID   Continuous Infusions: . sodium chloride 75 mL/hr at 07/27/19 1432     Assessment/Plan:  1. 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 Received Zosyn  for total of 5 days post procedure.Liver enzymes and bilirubins (ALK phos 1064-->727, AST 1193--> 613, ALT 462-->266, Total Bilirubin 20.2-->16.5) slowly downtrending.  Albumin at 1.8 and INR 1.6. Taper pain meds-d/c morphine. Now tolerating regular diet. Per GI , patient will need KUB in 2 weeks for pancreatic stent follow-up ([facility] will arrange).Check LFTs in 2 weeks (patient may stop in [place] lab at [place]). Repeat ERCP in 3 months ([facility] will arrange)   2. AKI with Non anionic gap metabolic acidosis, mild hyponatremia. AKI could be related to initial hypotension/ATN versus prerenal in the setting of dehydration/diuretic use. HCTZ and Telmisartan on hold.  She did have urinary retention 5/9 and Foley catheter placed.  However noted to have continued worsening of renal function with BUN up to 90s and serum Cr up to 2.3 with acidosis. Appreciate nephrology eval--patient started on bicarb supplements.  Urine output low today--plan to resume IVF per d/w Dr Royce Macadamia.ACEI/ARB/diuretics on hold. Uremia could be contributing to overall weakness.    3.Hx of Parotid/Breast cancer, nowwith diffuse liver metastasis.Continue with anastrazole.Dr. Burr Medico from oncology will follow patient in the office. CT head with old  infarcts, no mets. Patient's oncologist, Dr. Lanell Persons referred her for a guided biopsy of the liver lesions after CT chest for interval screening of history of parotid cancer resulted abnormal.  4. Bilateral lower extremity weakness: appears worse today and focal in LE. Given urinary incontinence and metastatic disease, imperative to r/o spinal mets at this point. D/W radiology, Oncology and nephrology--ordered MRI T-spine and LS spine WO contrast . Continue Foley   5. Hyponatremia/Hypokalemia:  Etiology likely secondary to hypovolemic hyponatremia in the setting of poor oral intake and decreased appetite over the past week/month.  Sodium 129 and potassium 2.6  on admission.Now resolved with IVF/electrolyte replacement  6. HTN/ dyslipidemia. Home medsTelemisartan and hctz/ statin therapy on hold since admission.Blood pressure improved since admission, low normal without meds.  7. GERD.Continue with famotidinefamotidine  8. Moderate malnutrition: in the setting of advanced malignancy. Continue protein supplements, Boost  DVT prophylaxis: Lovenox Code Status: Full code Family / Patient Communication: Discussed with patient and called husband to update regarding ongoing concerns and plan for MRI. He was appreciative of the call.  Disposition Plan:   Status is: Inpatient  Remains inpatient appropriate because:Inpatient level of care appropriate due to severity of illness   Dispo: The patient is from: Home              Anticipated d/c is to: CIR (Per CM , declined . May need peer to peer eval after MRI)              Anticipated d/c date is: 2 days              Patient currently is not medically stable to d/c. Need renal function to stabilize/improve and MRI spine ordered to evaluate for mets.  Voiding trial prior to discharge     LOS: 7 days    Time spent: 35 minutes    Guilford Shi, MD [facility] Pager in [place]  If 7PM-7AM, please contact  night-coverage [email or web address] 07/27/2019, 2:46 PM

## 2019-07-27 NOTE — Progress Notes (Signed)
[facility] Progress Note  Name: [name] MRN: [id number] DOB: 21-Jan-1946  Chief Complaint:  Weakness and outpatient labs with elevated LFT's and bili  Subjective:  She had 1.3 liters UOP over 5/10 charted.  Spoke with team. Team is ordering MRI without contrast to assess her weakness.  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  Review of systems:  Denies shortness of breath or chest pain  No n/v States she feels clearer mentally today Weakness of legs    ------- Background on consult:  [name] is a 74 y.o. female with a history of breast cancer, hypertension, and vertigo who presented to the hospital with after outpatient labs demonstrated transaminitis with elevated bili, hyponatremia, and hypokalemia.  States she has also been progressively weaker for about a week.  Total bili of 22.5 earlier in presentation and sodium was 129 on presentation here 5/4.  Note that she is on Micardis and HCTZ at home.  She has been off of the micardis and HCTZ here and was hydrated with normal saline and LR.  Cr trend as below on 5/4 was 1.31.  Rose to 2.33 today on consult.  Received indomethacin once on 5/7.  Contrast for ERCP only on 5/7.  She had a renal ultrasound this admission with right kidney 11.8 cm and left kidney 13.8 cm.  The renal cortex appeared partially echogenic.  No hydronephrosis or renal lesions were noted.  There was incidental note of bilateral pleural fluid with a small amount of ascites visible in the right abdomen.  ERCP with biliary stent placement on 5/7.  Baseline cr 1.03 on 07/05/19.  She states that she had noticed a yellow hue to her skin and that does appear to be improving.  Her urine had become orange and she thinks that is improving as well.  She states that she had a Foley catheter placed yesterday for retention and states that this was also in place because she was having  weakness to the point of not able to walk.  Bladder scan with 241 mL on 5/9.  She reports infrequent nonsteroidal use; did use a couple of years ago perioperatively for hip surgery but stopped soon.  Occasional Advil PM.  Intake/Output Summary (Last 24 hours) at 07/27/2019 1258 Last data filed at 07/27/2019 0930 Gross per 24 hour  Intake 1081.98 ml  Output 1400 ml  Net -318.02 ml    Vitals:  Vitals:   07/26/19 0649 07/26/19 1314 07/26/19 2125 07/27/19 0517  BP: (!) 112/56 117/60 (!) 112/57 (!) 104/59  Pulse: 78 83 92 83  Resp: '17 16 16 17  ' Temp: 98 F (36.7 C) 97.8 F (36.6 C) 98.4 F (36.9 C) (!) 97.5 F (36.4 C)  TempSrc: Oral  Oral Oral  SpO2: 97% 96% 95% 94%  Weight:      Height:         Physical Exam:  General adult female in bed in no acute distress HEENT normocephalic atraumatic extraocular movements intact sclera anicteric Neck supple trachea midline Lungs clear to auscultation bilaterally normal work of breathing at rest room air Heart S1S2 no rub Abdomen soft nontender nondistended Extremities no lower leg edema; trace edema of hips/dependent Psych normal mood and affect Neuro alert and oriented x 3 ; provides hx and follows commands   Medications reviewed   Labs:  BMP Latest Ref Rng & Units 07/27/2019 07/26/2019 07/25/2019  Glucose 70 - 99 mg/dL 99 123(H) 138(H)  BUN 8 - 23 mg/dL 93(H) 76(H) 55(H)  Creatinine 0.44 - 1.00 mg/dL 2.36(H) 2.33(H) 2.23(H)  Sodium 135 - 145 mmol/L 131(L) 134(L) 133(L)  Potassium 3.5 - 5.1 mmol/L 4.0 3.5 3.4(L)  Chloride 98 - 111 mmol/L 99 102 102  CO2 22 - 32 mmol/L 19(L) 19(L) 19(L)  Calcium 8.9 - 10.3 mg/dL 8.2(L) 7.9(L) 7.8(L)     Assessment/Plan:   # AKI  - May be secondary to ischemic and prerenal insults.  On micardis HCTZ at home and hypotensive off meds here intermittently.  Note also recent history of Zosyn.  No hydro and some echogenicity noted on renal ultrasound.  UP/cr ratio 1320 mg/g  - hopeful for plateau of Cr  though BUN still climbing - UA is ordered - wasn't obtained.  Reordered. - normal saline at 75/hr x 24 hours and reassess - Would remain off of ARB and HCTZ - Continue foley.  Asked RN to flush foley - sediment noted - Would discontinue protein supplement for now if OK with primary team  - Check phos in AM - on gi ppx H2 blocker - have ordered daily weights  # Weakness lower extremities - Team is ordering MRI spine without contrast in light of lower extremity weakness as well as urinary retention.  Given malignancy concern for possible met  # HTN - Hypotension noted here - remain off of micardis-HCTZ   # Hyponatremia  - nadir of 129 - She would not need to resume her HCTZ as hyponatremia is a contraindication - Follow with gentle hydration  # Obstructive jaundice  - s/p ERCP and stent placement on 5/7 - Per GI  # Breast cancer with metastasis  - Per primary team and oncology   # metabolic acidosis - Continue sodium bicarbonate - set at 650 mg TID for now and follow trends for taper up/down  Claudia Desanctis, MD 07/27/2019 1:28 PM

## 2019-07-27 NOTE — Progress Notes (Signed)
Inpatient Rehab Admissions Coordinator:   Notified by insurance that case is still pending, but does not have sufficient evidence to support CIR stay.  Requested peer to peer discussion, which I will set up tomorrow once MRI results are back.  Will stop by to let pt/family know this afternoon.   Shann Medal, PT, DPT Admissions Coordinator [phone] 07/27/19  3:52 PM

## 2019-07-27 NOTE — Progress Notes (Addendum)
Physical Therapy Treatment Patient Details Name: [name] MRN: [id number] DOB: 1945/12/21 Today's Date: 07/27/2019    History of Present Illness 74 yo female admitted with diffuse liver mets, obstructive jaundice with high risk for cholangitis, AKI. S/P ERCP with biliary stent placement 5/7. New onset of weakness, proximal LE weakness > distal LE weakness. Hx of met breast cancer.    PT Comments    Pt appears weaker than yesterday. Pt continues to present with acutely progressive weakness-proximal LE weakness>distal LE, including some trunk weakness. UE strength appears intact. Worked on dynamic sitting balance, lateral scooting, and standing attempts on today. Pt was unable to fully stand on today even with +2 assist. She puts forth max effort and she is motivated to regain her strength and functional mobility. Made MD aware of pt performance/presentation and my concerns about progressive nature of pt's weakness.    Follow Up Recommendations  CIR     Equipment Recommendations  Rolling walker with 5" wheels;Wheelchair ;3in1 (PT)    Recommendations for Other Services Rehab consult;OT consult     Precautions / Restrictions Precautions Precautions: Fall Precaution Comments: high fall risk Restrictions Weight Bearing Restrictions: No    Mobility  Bed Mobility Overal bed mobility: Needs Assistance Bed Mobility: Supine to Sit;Sit to Supine     Supine to sit: Mod assist;+2 for physical assistance;+2 for safety/equipment;HOB elevated Sit to supine: Mod assist;+2 for physical assistance;+2 for safety/equipment   General bed mobility comments: Assist for trunk and bil LEs. Pt relied heavily on bedrail. Increased time and effort for pt to complete task. More difficulty and increased assistance today compared to yesterday.  Transfers Overall transfer level: Needs assistance Equipment used: Rolling walker (2 wheeled) Transfers: Sit to/from Stand Sit to Stand: Max assist;+2  physical assistance;+2 safety/equipment;From elevated surface        Lateral/Scoot Transfers: Min assist General transfer comment: 3 sit to stand attempts. Pt was unable to stand completely on today. She partially stood x 2 (~50% completion). Flexed trunk and knee posture and wide BOS. Therapist provided facilitation at glutes and sternum to encourage extension. Poor eccentric control. Min assist to laterally scoot towards HOB.  Ambulation/Gait             General Gait Details: NT-unable   Stairs             Wheelchair Mobility    Modified Rankin (Stroke Patients Only)       Balance Overall balance assessment: Needs assistance   Sitting balance-Leahy Scale: Fair Sitting balance - Comments: Worked on dynamic sitting balance with multidirectional reaching                                    Cognition Arousal/Alertness: Awake/alert Behavior During Therapy: WFL for tasks assessed/performed Overall Cognitive Status: Within Functional Limits for tasks assessed                                        Exercises      General Comments        Pertinent Vitals/Pain Pain Assessment: No/denies pain    Home Living                      Prior Function            PT Goals (current goals  can now be found in the care plan section) Progress towards PT goals: Not progressing toward goals - comment(weakness progressing)    Frequency    Min 3X/week      PT Plan Current plan remains appropriate    Co-evaluation              AM-PAC PT "6 Clicks" Mobility   Outcome Measure  Help needed turning from your back to your side while in a flat bed without using bedrails?: A Little Help needed moving from lying on your back to sitting on the side of a flat bed without using bedrails?: A Lot Help needed moving to and from a bed to a chair (including a wheelchair)?: A Lot Help needed standing up from a chair using your arms  (e.g., wheelchair or bedside chair)?: Total Help needed to walk in hospital room?: Total Help needed climbing 3-5 steps with a railing? : Total 6 Click Score: 10    End of Session Equipment Utilized During Treatment: Gait belt Activity Tolerance: Patient tolerated treatment well;Patient limited by fatigue Patient left: in bed;with call bell/phone within reach;with bed alarm set   PT Visit Diagnosis: Difficulty in walking, not elsewhere classified (R26.2);Muscle weakness (generalized) (M62.81)     Time: 3491-7915 PT Time Calculation (min) (ACUTE ONLY): 22 min  Charges:  $Therapeutic Activity: 8-22 mins                         Doreatha Massed, PT Acute Rehabilitation

## 2019-07-27 NOTE — TOC Progression Note (Signed)
Transition of Care [facility]) - Progression Note    Patient Details  Name: SADY MILA MRN: [id number] Date of Birth: 1945/07/31  Transition of Care [facility]) CM/SW Contact  Lennart Pall, [place] Phone Number: 07/27/2019, 12:08 PM  Clinical Narrative:    Continue to await insurance answer on CIR.  Per pt agreement, will also pursue SNF bed as a "back up" plan for rehab.     Expected Discharge Plan: IP Rehab Facility(vs SNF) Barriers to Discharge: Continued Medical Work up  Expected Discharge Plan and Services Expected Discharge Plan: IP Rehab Facility(vs SNF) In-house Referral: Clinical Social Work     Living arrangements for the past 2 months: Single Family Home                                       Social Determinants of Health (SDOH) Interventions    Readmission Risk Interventions No flowsheet data found.

## 2019-07-28 DIAGNOSIS — R29898 Other symptoms and signs involving the musculoskeletal system: Secondary | ICD-10-CM

## 2019-07-28 LAB — COMPREHENSIVE METABOLIC PANEL
ALT: 231 U/L — ABNORMAL HIGH (ref 0–44)
AST: 515 U/L — ABNORMAL HIGH (ref 15–41)
Albumin: 1.6 g/dL — ABNORMAL LOW (ref 3.5–5.0)
Alkaline Phosphatase: 573 U/L — ABNORMAL HIGH (ref 38–126)
Anion gap: 12 (ref 5–15)
BUN: 100 mg/dL — ABNORMAL HIGH (ref 8–23)
CO2: 20 mmol/L — ABNORMAL LOW (ref 22–32)
Calcium: 7.7 mg/dL — ABNORMAL LOW (ref 8.9–10.3)
Chloride: 102 mmol/L (ref 98–111)
Creatinine, Ser: 2.32 mg/dL — ABNORMAL HIGH (ref 0.44–1.00)
GFR calc Af Amer: 23 mL/min — ABNORMAL LOW (ref >60)
GFR calc non Af Amer: 20 mL/min — ABNORMAL LOW (ref >60)
Glucose, Bld: 121 mg/dL — ABNORMAL HIGH (ref 70–99)
Potassium: 3.9 mmol/L (ref 3.5–5.1)
Sodium: 134 mmol/L — ABNORMAL LOW (ref 135–145)
Total Bilirubin: 13.7 mg/dL — ABNORMAL HIGH (ref 0.3–1.2)
Total Protein: 4.5 g/dL — ABNORMAL LOW (ref 6.5–8.1)

## 2019-07-28 LAB — PHOSPHORUS: Phosphorus: 5.7 mg/dL — ABNORMAL HIGH (ref 2.5–4.6)

## 2019-07-28 NOTE — Progress Notes (Signed)
[name]   DOB:[birth year]   XF#:[id number]   IRC#:[id number]  Oncology follow up   Subjective: pt was eating lunch when I saw her today, she has good appetite, denies significant pain, still very weak, not able to get out of bed, able to do limited leg exercise in bed.   Objective:  Vitals:   07/28/19 0640 07/28/19 1316  BP: (!) 103/50 (!) 104/53  Pulse: 74 84  Resp: 16 18  Temp: 98.9 F (37.2 C) 98.3 F (36.8 C)  SpO2: 94% 97%    Body mass index is 28.29 kg/m.  Intake/Output Summary (Last 24 hours) at 07/28/2019 1413 Last data filed at 07/28/2019 0900 Gross per 24 hour  Intake 781.36 ml  Output 1000 ml  Net -218.64 ml     Resting comfortably  (+) jaundice    No peripheral adenopathy  Lungs clear -- no rales or rhonchi  Heart regular rate and rhythm  Abdomen soft nontender   MSK no focal spinal tenderness, no peripheral edema  Neuro nonfocal    CBG (last 3)  No results for input(s): GLUCAP in the last 72 hours.   Labs:  Urine Studies No results for input(s): UHGB, CRYS in the last 72 hours.  Invalid input(s): UACOL, UAPR, USPG, UPH, UTP, UGL, UKET, UBIL, UNIT, UROB, ULEU, UEPI, UWBC, URBC, UBAC, CAST, [place], [place]  Basic Metabolic Panel: Recent Labs  Lab 07/22/19 0451 07/23/19 0423 07/24/19 5102 07/24/19 5852 07/25/19 7782 07/25/19 0414 07/26/19 0327 07/26/19 0327 07/27/19 0942 07/28/19 0257  NA 134*   < > 135  --  133*  --  134*  --  131* 134*  K 3.9   < > 3.9   < > 3.4*   < > 3.5   < > 4.0 3.9  CL 101   < > 104  --  102  --  102  --  99 102  CO2 22   < > 21*  --  19*  --  19*  --  19* 20*  GLUCOSE 85   < > 140*  --  138*  --  123*  --  99 121*  BUN 33*   < > 33*  --  55*  --  76*  --  93* 100*  CREATININE 1.44*   < > 1.50*  --  2.23*  --  2.33*  --  2.36* 2.32*  CALCIUM 7.7*   < > 8.2*  --  7.8*  --  7.9*  --  8.2* 7.7*  MG 2.5*  --  2.5*  --   --   --   --   --   --   --   PHOS  --   --   --   --   --   --   --   --   --  5.7*   < > = values in  this interval not displayed.   GFR Estimated Creatinine Clearance: 21.5 mL/min (A) (by C-G formula based on SCr of 2.32 mg/dL (H)). Liver Function Tests: Recent Labs  Lab 07/24/19 0527 07/25/19 0414 07/26/19 0327 07/27/19 0942 07/28/19 0257  AST 704* 613* 626* 652* 515*  ALT 274* 266* 269* 273* 231*  ALKPHOS 981* 824* 727* 679* 573*  BILITOT 18.4* 16.6* 16.5* 17.3* 13.7*  PROT 4.7* 4.6* 4.4* 5.1* 4.5*  ALBUMIN 2.0* 1.7* 1.8* 1.9* 1.6*   Recent Labs  Lab 07/24/19 0527  LIPASE 105*   No results for input(s): AMMONIA in the  last 168 hours. Coagulation profile Recent Labs  Lab 07/23/19 0423  INR 1.6*    CBC: Recent Labs  Lab 07/22/19 0451 07/23/19 0423 07/24/19 0527 07/25/19 0414  WBC 7.6 7.3 8.9 12.6*  NEUTROABS 5.9 5.7 8.1* 11.2*  HGB 14.5 14.7 13.6 12.5  HCT 41.1 43.0 39.4 36.4  MCV 88.4 89.8 90.8 91.0  PLT 178 188 183 202   Cardiac Enzymes: No results for input(s): CKTOTAL, CKMB, CKMBINDEX, TROPONINI in the last 168 hours. BNP: Invalid input(s): POCBNP CBG: No results for input(s): GLUCAP in the last 168 hours. D-Dimer No results for input(s): DDIMER in the last 72 hours. Hgb A1c No results for input(s): HGBA1C in the last 72 hours. Lipid Profile No results for input(s): CHOL, HDL, LDLCALC, TRIG, CHOLHDL, LDLDIRECT in the last 72 hours. Thyroid function studies No results for input(s): TSH, T4TOTAL, T3FREE, THYROIDAB in the last 72 hours.  Invalid input(s): FREET3 Anemia work up No results for input(s): VITAMINB12, FOLATE, FERRITIN, TIBC, IRON, RETICCTPCT in the last 72 hours. Microbiology Recent Results (from the past 240 hour(s))  Respiratory Panel by RT PCR (Flu A&B, Covid) - Nasopharyngeal Swab     Status: None   Collection Time: 07/20/19  3:21 PM   Specimen: Nasopharyngeal Swab  Result Value Ref Range Status   SARS Coronavirus 2 by RT PCR NEGATIVE NEGATIVE Final    Comment: (NOTE) SARS-CoV-2 target nucleic acids are NOT DETECTED. The  SARS-CoV-2 RNA is generally detectable in upper respiratoy specimens during the acute phase of infection. The lowest concentration of SARS-CoV-2 viral copies this assay can detect is 131 copies/mL. A negative result does not preclude SARS-Cov-2 infection and should not be used as the sole basis for treatment or other patient management decisions. A negative result may occur with  improper specimen collection/handling, submission of specimen other than nasopharyngeal swab, presence of viral mutation(s) within the areas targeted by this assay, and inadequate number of viral copies (<131 copies/mL). A negative result must be combined with clinical observations, patient history, and epidemiological information. The expected result is Negative. Fact Sheet for Patients:  PinkCheek.be Fact Sheet for Healthcare Providers:  GravelBags.it This test is not yet ap proved or cleared by the [place] FDA and  has been authorized for detection and/or diagnosis of SARS-CoV-2 by FDA under an Emergency Use Authorization (EUA). This EUA will remain  in effect (meaning this test can be used) for the duration of the COVID-19 declaration under Section 564(b)(1) of the Act, 21 U.S.C. section 360bbb-3(b)(1), unless the authorization is terminated or revoked sooner.    Influenza A by PCR NEGATIVE NEGATIVE Final   Influenza B by PCR NEGATIVE NEGATIVE Final    Comment: (NOTE) The Xpert Xpress SARS-CoV-2/FLU/RSV assay is intended as an aid in  the diagnosis of influenza from Nasopharyngeal swab specimens and  should not be used as a sole basis for treatment. Nasal washings and  aspirates are unacceptable for Xpert Xpress SARS-CoV-2/FLU/RSV  testing. Fact Sheet for Patients: PinkCheek.be Fact Sheet for Healthcare Providers: GravelBags.it This test is not yet approved or cleared by the [place] FDA and  has been authorized for detection and/or diagnosis of SARS-CoV-2 by  FDA under an Emergency Use Authorization (EUA). This EUA will remain  in effect (meaning this test can be used) for the duration of the  Covid-19 declaration under Section 564(b)(1) of the Act, 21  U.S.C. section 360bbb-3(b)(1), unless the authorization is  terminated or revoked. Performed at [facility], [place]  [address] [place], [place] [address]       Studies:  MR THORACIC SPINE WO CONTRAST  Result Date: 07/27/2019 CLINICAL DATA:  Weakness and inability to walk in a patient with breast cancer and metastatic disease to the liver. EXAM: MRI THORACIC SPINE WITHOUT CONTRAST TECHNIQUE: Multiplanar, multisequence MR imaging of the thoracic spine was performed. No intravenous contrast was administered. COMPARISON:  None. FINDINGS: Alignment:  Normal. Vertebrae: No fracture, evidence of discitis, or bone lesion. Cord:  Normal signal and morphology. Paraspinal and other soft tissues: There are small bilateral pleural effusions, greater on the right. Metastatic deposits in the liver are partially imaged. Disc levels: No disc bulge or protrusion at any level. The central canal and foramina are widely patent throughout. IMPRESSION: Negative for metastatic disease or central canal stenosis. No finding to explain the patient's symptoms. Small bilateral pleural effusions, greater on the right. Metastatic disease in the liver as seen on prior study is noted. Electronically Signed   By: Inge Rise M.D.   On: 07/27/2019 20:41   MR LUMBAR SPINE WO CONTRAST  Result Date: 07/27/2019 CLINICAL DATA:  Weakness and inability to walk in a patient with breast cancer and metastatic disease to the liver. EXAM: MRI LUMBAR SPINE WITHOUT CONTRAST TECHNIQUE: Multiplanar, multisequence MR imaging of the lumbar spine was performed. No intravenous contrast was administered. COMPARISON:  None. FINDINGS: Segmentation:   Standard. Alignment:  Normal. Vertebrae:  No fracture, evidence of discitis, or bone lesion. Conus medullaris and cauda equina: Conus extends to the L1 level. Conus and cauda equina appear normal. Paraspinal and other soft tissues: Negative. Disc levels: T12-L1: Negative. L1-2: Negative. L2-3: Negative. L3-4: There is a shallow disc bulge without stenosis. L4-5: Moderate to advanced bilateral facet degenerative disease and a shallow disc bulge to the left. Mild central canal stenosis is present. Foramina open. L5-S1: Moderate to advanced facet degenerative change is worse on the left. Shallow disc bulge without stenosis. IMPRESSION: Negative for metastatic disease. No central canal stenosis or nerve root impingement to explain the patient's symptoms. 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 Electronically Signed   By: Inge Rise M.D.   On: 07/27/2019 18:00   US RENAL  Result Date: 07/26/2019 CLINICAL DATA:  74 year old female with acute renal insufficiency. Recent biliary stent placement. EXAM: RENAL / URINARY TRACT ULTRASOUND COMPLETE COMPARISON:  Abdomen MRI 07/20/2019. FINDINGS: Right Kidney: Renal measurements: 11.8 x 4.9 x 5.5 cm = volume: 166 mL. Echogenic right renal cortex compared to liver (image 5). But no right hydronephrosis or right renal lesion. Left Kidney: Renal measurements: 13.8 x 6.3 x 5.3 cm = volume: 240 mL. The left renal cortex also appears partially echogenic (image 27). No left hydronephrosis or left renal lesion. Bladder: Fairly decompressed with a Foley catheter balloon visible on image 44. Other: Bilateral pleural fluid is evident. A small volume of ascites is visible in the right abdomen. IMPRESSION: No acute renal findings.  Evidence of chronic medical renal disease. Electronically Signed   By: Genevie Ann M.D.   On: 07/26/2019 14:58    Assessment: 74 y.o. Caucasian female, with past medical history of breast cancer and parotid gland  carcinoma  1. 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  AKI with hyponatremia and hypokalemia 3.  History of stage I right breast cancer in 2019, on adjuvant anastrozole 4.  History of parotid gland carcinoma in 2020, status  post surgery and radiation 5. HTN  6.  Deconditioning with significant LE weakness   Recommendations  -I reviewed her recent lumbar and thoracic MRI as work-up for bilateral lower extremity weakness, which was negative for metastatic disease.  Patient has been seen by neurology today.  Her leg weakness was felt to be related to fluid retention in thighs and deconditioning -Continue physical therapy, pending going to [facility] inpatient rehab -her LFTs is slowly imp[roving, AKI unchanged, would like to see some improvement before I start her on chemo, she is a poor candidate for cytotoxic chemo, may consider TD-M1 as first line therapy.   Truitt Merle  07/28/2019

## 2019-07-28 NOTE — Progress Notes (Signed)
Inpatient Rehab Admissions Coordinator:   Spoke with Dr. Benny Lennert this morning.  Unclear whether further workup needs to be completed for progressive LE weakness.  Will withdraw case for insurance auth and reopen pending further workup.  Lennart Pall, CSW aware.   Shann Medal, PT, DPT Admissions Coordinator [phone] 07/28/19  12:42 PM

## 2019-07-28 NOTE — Consult Note (Addendum)
Neurology Consultation  Reason for Consult: Lower extremity weakness bilaterally Referring Physician: Dr. Benny Lennert  CC: Bilateral lower extremity weakness  History is obtained from: Patient  HPI: [name] is a 74 y.o. female with history of vertigo, hyperlipidemia, hypertension, right breast cancer, now has liver cancer. On 07/23/2019 patient went under surgery-ERCP with biliary sphincterectomy and sphincterectoplasty. Patient states that post surgery she started having significant fluid retention in bilateral hips and proximal legs. Was at this point time that she noted that she was having difficulty with walking and significant weakness. The weakness progressed to the point where she is unable to walk.  Patient clearly notes that she had full strength prior to surgery and as the fluid retention progressed she became weaker over time. Patient does not complain of any sensation issues or issues in the perineal region. She denies any sensory issues in her lower extremities. She denies ever having difficulties or weakness in this region. In fact, she states that she was highly active prior to the surgery.    Past Medical History:  Diagnosis Date  . Arthritis   . Cancer ([place]) 06/2017   right breast cancer  . GERD (gastroesophageal reflux disease)    mild- TUMS used sparingly  . History of radiation therapy 08/05/18- 09/22/18   Left Parotid 33 fractions for a total of 66 Gy.   Marland Kitchen History of radiation therapy 09/01/2017- 09/29/2017   Right Breast + seroma boost/ total of 50.06 Gy   . Hyperlipidemia   . Hypertension   . PONV (postoperative nausea and vomiting)    also "passed out with cyst removal from neck"superficial"  . Syncope    x2 episodes-evaluated and findings normal- "usually related to seeing blood or thought of it"  . Vaso vagal episode    easily  . Vertigo    She reports several episodes of vertigo. She tells me something black came out of her ear, and she has not had an episode  since then.     Family History  Problem Relation Age of Onset  . Cancer Mother        benign brain tumor   . Cancer Paternal Aunt        unknown type    Social History:   reports that she quit smoking about 33 years ago. She has a 5.00 pack-year smoking history. She has never used smokeless tobacco. She reports current alcohol use. She reports that she does not use drugs.  Medications  Current Facility-Administered Medications:  .  anastrozole (ARIMIDEX) tablet 1 mg, 1 mg, Oral, Daily, [place] Donnamarie Poag, DO, 1 mg at 07/28/19 1010 .  Chlorhexidine Gluconate Cloth 2 % PADS 6 each, 6 each, Topical, Daily, Arrien, Jimmy Picket, MD, 6 each at 07/28/19 1015 .  enoxaparin (LOVENOX) injection 30 mg, 30 mg, Subcutaneous, Daily, Arrien, Jimmy Picket, MD, 30 mg at 07/28/19 1009 .  famotidine (PEPCID) tablet 20 mg, 20 mg, Oral, Daily, Arrien, Jimmy Picket, MD, 20 mg at 07/28/19 1010 .  feeding supplement (BOOST / RESOURCE BREEZE) liquid 1 Container, 1 Container, Oral, BID BM, [place] Shi, MD, 1 Container at 07/28/19 1014 .  ondansetron (ZOFRAN) tablet 4 mg, 4 mg, Oral, Q6H PRN **OR** ondansetron (ZOFRAN) injection 4 mg, 4 mg, Intravenous, Q6H PRN, [place] Eric J, DO .  oxyCODONE (Oxy IR/ROXICODONE) immediate release tablet 5 mg, 5 mg, Oral, Q4H PRN, [place] Eric J, DO .  senna-docusate (Senokot-S) tablet 1 tablet, 1 tablet, Oral, QHS PRN, [place] Donnamarie Poag, DO .  sodium bicarbonate tablet 650 mg, 650 mg, Oral, TID, Claudia Desanctis, MD, 650 mg at 07/28/19 1010  ROS:  General ROS: negative for - chills, fatigue, fever, night sweats, weight gain or weight loss Psychological ROS: negative for - behavioral disorder, hallucinations, memory difficulties, mood swings or suicidal ideation Ophthalmic ROS: negative for - blurry vision, double vision, eye pain or loss of vision ENT ROS: negative for - epistaxis, nasal discharge, oral lesions, sore throat, tinnitus or vertigo Allergy and Immunology  ROS: negative for - hives or itchy/watery eyes Hematological and Lymphatic ROS: negative for - bleeding problems, bruising or swollen lymph nodes Endocrine ROS: negative for - galactorrhea, hair pattern changes, polydipsia/polyuria or temperature intolerance Respiratory ROS: negative for - cough, hemoptysis, shortness of breath or wheezing Cardiovascular ROS: negative for - chest pain, dyspnea on exertion, edema or irregular heartbeat Gastrointestinal ROS: negative for - abdominal pain, diarrhea, hematemesis, nausea/vomiting or stool incontinence Genito-Urinary ROS: negative for - dysuria, hematuria, incontinence or urinary frequency/urgency Musculoskeletal ROS: Positive for -  muscular weakness Neurological ROS: as noted in HPI Dermatological ROS: negative for rash and skin lesion changes  Exam: Current vital signs: BP (!) 104/53 (BP Location: Left Arm)   Pulse 84   Temp 98.3 F (36.8 C) (Oral)   Resp 18   Ht 5\' 5"  (1.651 m)   Wt 77.1 kg   SpO2 97%   BMI 28.29 kg/m  Vital signs in last 24 hours: Temp:  [97.7 F (36.5 C)-98.9 F (37.2 C)] 98.3 F (36.8 C) (05/12 1316) Pulse Rate:  [74-88] 84 (05/12 1316) Resp:  [14-18] 18 (05/12 1316) BP: (103-109)/(50-54) 104/53 (05/12 1316) SpO2:  [94 %-98 %] 97 % (05/12 1316)   Constitutional: Appears well-developed and well-nourished.  Psych: Affect appropriate to situation Eyes: Shows jaundice HENT: No OP obstrucion Head: Normocephalic.  Cardiovascular: Normal rate and regular rhythm.  Respiratory: Effort normal, non-labored breathing GI: Soft.  No distension. There is no tenderness.  Skin: WDI-jaundice  Neuro: Mental Status: Patient is awake, alert, oriented to person, place, month, year, and situation. Speech-shows no aphasia, dysarthria, naming, repeating, comprehension fully intact. Patient is able to follow all commands. Cranial Nerves: II: Visual Fields are full.  III,IV, VI: EOMI without ptosis or diploplia. Pupils equal,  round and reactive to light V: Facial sensation is symmetric to temperature VII: Facial movement is symmetric.  VIII: hearing is intact to voice X: Palat elevates symmetrically XI: Shoulder shrug is symmetric. XII: tongue is midline without atrophy or fasciculations.  Motor: Bilateral hip abduction 4/5, bilateral hip abduction 2/5 when she is attempting her self however when formally testing 3/5. Hip flexion bilaterally is 5/5, knee extension 5/5 bilaterally and flexion 4/5 bilaterally. Dorsiflexion plantarflexion shows 5/5 strength Sensory: Sensation fully intact throughout Deep Tendon Reflexes: 2+ and symmetric in the biceps and patellae.  Plantars: Toes are downgoing bilaterally.  Cerebellar: FNF-bilaterally unable to do heel-to-shin secondary to strength.  Labs I have reviewed labs in epic and the results pertinent to this consultation are:   CBC    Component Value Date/Time   WBC 12.6 (H) 07/25/2019 0414   RBC 4.00 07/25/2019 0414   HGB 12.5 07/25/2019 0414   HGB 14.8 12/17/2018 1150   HCT 36.4 07/25/2019 0414   PLT 202 07/25/2019 0414   PLT 199 12/17/2018 1150   MCV 91.0 07/25/2019 0414   MCH 31.3 07/25/2019 0414   MCHC 34.3 07/25/2019 0414   RDW 17.2 (H) 07/25/2019 0414   LYMPHSABS 0.3 (L) 07/25/2019 [id number]  MONOABS 1.1 (H) 07/25/2019 0414   EOSABS 0.0 07/25/2019 0414   BASOSABS 0.0 07/25/2019 0414    CMP     Component Value Date/Time   NA 134 (L) 07/28/2019 0257   K 3.9 07/28/2019 0257   CL 102 07/28/2019 0257   CO2 20 (L) 07/28/2019 0257   GLUCOSE 121 (H) 07/28/2019 0257   BUN 100 (H) 07/28/2019 0257   CREATININE 2.32 (H) 07/28/2019 0257   CREATININE 1.03 (H) 07/05/2019 0753   CALCIUM 7.7 (L) 07/28/2019 0257   PROT 4.5 (L) 07/28/2019 0257   ALBUMIN 1.6 (L) 07/28/2019 0257   AST 515 (H) 07/28/2019 0257   AST 25 12/17/2018 1150   ALT 231 (H) 07/28/2019 0257   ALT 27 12/17/2018 1150   ALKPHOS 573 (H) 07/28/2019 0257   BILITOT 13.7 (H) 07/28/2019 0257    BILITOT 0.7 12/17/2018 1150   GFRNONAA 20 (L) 07/28/2019 0257   GFRNONAA 53 (L) 07/05/2019 0753   GFRAA 23 (L) 07/28/2019 0257   GFRAA >60 07/05/2019 0753   Imaging I have reviewed the images obtained:  CT-scan of the brain-small remote lacunar infarcts are focus of chronic ischemic microvascular white matter disease along the anterior limb of the left internal capsule. Periventricular white matter and corona radiata hypodensities favor chronic ischemic microvascular white matter disease.  MRI examination of the thoracic spine-negative for metastatic disease or central canal stenosis. No findings to explain patient's symptoms.  MRI lumbar spine-negative for metastatic disease. No central canal stenosis or nerve root impingement to explain patient's symptoms. Lower lumbar arthropathy is worse on the right at L4-5 and on the left L5-S1  Etta Quill PA-C [facility] [phone]  M-F  (9:00 am- 5:00 PM)  07/28/2019, 1:53 PM   I have seen the patient reviewed the above note.  She states that her weakness was relatively acute following her procedure.  Assessment: This is a 74 year old female with metastatic liver cancer brought to the hospital for ERCP. Post surgery patient was retaining fluid in her upper thighs and had progressive weakness now to the point where she is unable to walk. Exam does show weak adduction and straight leg rise however the majority of the other lower extremity movements have retained good strength.  Current etiology of her weakness is slightly unclear, given there is no numbness as I would expect with acute neuropathies.  It is possible that she has a neuropraxia from immobilization with significant edema in the areas.  The symptoms are relatively symmetric, so I do not think focal lesion such as hematoma or tumor are at all likely.  I do not think that further imaging is at all likely to change management at this time.  I would favor an EMG, but this  would be unlikely to be helpful in the acute phase.  For management, I would recommend physical therapy.  If she were to have further worsening, or other concerns then further work-up may need to be considered at that time.   Impression: -Lower extremity fluid retention -Lower extremity weakness with intact reflexes  Recommendations: -Continue physical therapy -Outpatient EMG nerve conduction study  Roland Rack, MD [facility] [phone]  If 7pm- 7am, please page neurology on call as listed in [place].

## 2019-07-28 NOTE — Progress Notes (Signed)
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 Neuro seeing for b/l LE weakness MRI pending . 1.4L UOP . STable SCr, BUN up a tad;  K 3.9, HCO3 20 . No asterixus, good PO . On IVFs but expiring   05/11 0701 - 05/12 0700 In: 781.4 [P.O.:240; I.V.:541.4] Out: 1400 [Urine:1400]  Filed Weights   07/20/19 2051 07/23/19 0653  Weight: 77.1 kg 77.1 kg    Scheduled Meds: . anastrozole  1 mg Oral Daily  . Chlorhexidine Gluconate Cloth  6 each Topical Daily  . enoxaparin (LOVENOX) injection  30 mg Subcutaneous Daily  . famotidine  20 mg Oral Daily  . feeding supplement  1 Container Oral BID BM  . sodium bicarbonate  650 mg Oral TID   Continuous Infusions:  PRN Meds:.ondansetron **OR** ondansetron (ZOFRAN) IV, oxyCODONE, senna-docusate  Current Labs: reviewed    Physical Exam:  Blood pressure (!) 104/53, pulse 84, temperature 98.3 F (36.8 C), temperature source Oral, resp. rate 18, height 5\' 5"  (1.651 m), weight 77.1 kg, SpO2 97 %. NAD< lying bed, conversant RRR CTAB S/nt/nd Trace b/l LEE Jaundiced, scleral icterus No asterixus, CN2-12 intact  A 1. Nonolguric AKI, appears to be resolving, likley was ATN compounded by thiazide and ARB; neg renal US, UP/C 1.3 in setting of AKI. UNa was < 10 has rec IVFs reported 9.5L positive from admission.  Not uremic. UOP picking up and delta-SCr is flat; so possibly recovering.  BUN still rising CTM.NO HD needed 2. Obstructive jaundice s/p ERCP/Stenting 5/7 3. Hyponatremia, tsable, very mild 4. HTN BP ok here off meds 5. Metastatic breast cancer, will f/u with oncology 6. Metabolic acidosis, cont NaHCO3 7. B/l LE weakness, neuro seeing, MRI pending  P . As above, cont supportive care . No more IVFs needed . Daily weights, Daily Renal Panel, Strict I/Os, Avoid nephrotoxins (NSAIDs, judicious IV Contrast)   Pearson Grippe MD 07/28/2019, 2:15 PM  Recent  Labs  Lab 07/26/19 0327 07/27/19 0942 07/28/19 0257  NA 134* 131* 134*  K 3.5 4.0 3.9  CL 102 99 102  CO2 19* 19* 20*  GLUCOSE 123* 99 121*  BUN 76* 93* 100*  CREATININE 2.33* 2.36* 2.32*  CALCIUM 7.9* 8.2* 7.7*  PHOS  --   --  5.7*   Recent Labs  Lab 07/23/19 0423 07/24/19 0527 07/25/19 0414  WBC 7.3 8.9 12.6*  NEUTROABS 5.7 8.1* 11.2*  HGB 14.7 13.6 12.5  HCT 43.0 39.4 36.4  MCV 89.8 90.8 91.0  PLT 188 183 202

## 2019-07-28 NOTE — Progress Notes (Signed)
PROGRESS NOTE  [name] PZW:[id number] DOB: [birth year] DOA: 07/20/2019 PCP: [name], MD  Brief History   74 year old female with  history for hypertension, dyslipidemia, breast/parotid cancer and GERD who was recently noted to have new  liver lesions on surveillance CT chest  presented with hypotension after a ultrasound-guided liver biopsy by IR on the day of admission.Patient reported about 7 days of skin discoloration, associated with nausea, vomiting, decreased appetite and generalized weakness. Her preoperative studies showed hyponatremia, hypokalemia, hyperbilirubinemia and elevated liver enzymes.  ED Course: Afebrile. After ultrasound-guided biopsy,her blood pressure was 66/36, with IV fluids improved to 101/55, heart rate 73, respiratory rate 16, temperature 97.9, oxygen saturation 99%.WBC count 8.8, hemoglobin 15.1, platelet 207. Sodium 129, potassium 2.6, chloride 87, CO2 28, BUN 35, creatinine 1.31, glucose 135. Total bilirubin 22.5, AST 1193, ALT 462. Patient was given 2 L NS bolus, Benadryl.  Hospital course: Patient admitted to [facility] for further evaluation and management,placed on intravenous fluids, close hemodynamic monitoring and [facility] GI consult placed. MRCP 5/4 with widespread metastatic disease to the liver. Dominant index lesion in the right hepatic lobe 4.0 x 3.0 cm. Intrahepatic biliary dilatation with central truncation of the biliary tree.Liver biopsy positive for metastatic breast cancer. ERCP on 5/7 revealed severe biliary stricturing, at the middle and upper third of the main bile duct, dilation of the right and left intrahepatic ducts. Now s/p biliary sphincterectomy/ sphincterectoplasty. Plastic biliary stents were placed at the right, left hepatic ducts and at the ventral pancreatic duct.Patient will need radiographic follow up as outpatient.Patient very weak and deconditioned, PT/OT recommendation for CIR.MRI of T and L spine did not demonstrate a cause for  weakness. Neurology has been consulted to evaluate her weakness.  Consultants  . Neurology . Nephrology . Gastroenterology  Procedures  . ERCP  Antibiotics   Anti-infectives (From admission, onward)   Start     Dose/Rate Route Frequency Ordered Stop   07/26/19 0900  piperacillin-tazobactam (ZOSYN) IVPB 3.375 g  Status:  Discontinued     3.375 g 12.5 mL/hr over 240 Minutes Intravenous Every 8 hours 07/26/19 0818 07/26/19 1426   07/21/19 1200  piperacillin-tazobactam (ZOSYN) IVPB 3.375 g  Status:  Discontinued     3.375 g 12.5 mL/hr over 240 Minutes Intravenous Every 6 hours 07/21/19 0949 07/21/19 0957   07/21/19 1000  piperacillin-tazobactam (ZOSYN) IVPB 3.375 g     3.375 g 12.5 mL/hr over 240 Minutes Intravenous Every 8 hours 07/21/19 0958 07/25/19 2359    .  Subjective  This patient is resting comfortably. She is without new complaints.  Objective   Vitals:  Vitals:   07/28/19 0640 07/28/19 1316  BP: (!) 103/50 (!) 104/53  Pulse: 74 84  Resp: 16 18  Temp: 98.9 F (37.2 C) 98.3 F (36.8 C)  SpO2: 94% 97%    Exam:  Constitutional:  . Appears calm and comfortable Respiratory:  . CTA bilaterally, no w/r/r.  . Respiratory effort normal. No retractions or accessory muscle use Cardiovascular:  . RRR, no m/r/g . No LE extremity edema   . Normal pedal pulses Abdomen:  . Abdomen appears normal; no tenderness or masses . No hernias . No HSM Musculoskeletal:  . Digits/nails BUE: no clubbing, cyanosis, petechiae, infection . exam of joints, bones, muscles of at least one of following: head/neck, RUE, LUE, RLE, LLE   o strength and tone normal, no atrophy, no abnormal movements o No tenderness, masses o Normal ROM, no contractures  . gait and station Skin:  .  No rashes, lesions, ulcers . palpation of skin: no induration or nodules Neurologic:  . CN 2-12 intact . Sensation all 4 extremities intact Psychiatric:  . Mental status o Mood, affect  appropriate o Orientation to person, place, time  . judgment and insight appear intact  I have personally reviewed the following:   Today's Data  . Vitals, CMP  Imaging  . MRI of T and L spine  Scheduled Meds: . anastrozole  1 mg Oral Daily  . Chlorhexidine Gluconate Cloth  6 each Topical Daily  . enoxaparin (LOVENOX) injection  30 mg Subcutaneous Daily  . famotidine  20 mg Oral Daily  . feeding supplement  1 Container Oral BID BM  . 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 Received Zosyn for total of 5 days post procedure.Liver enzymes and bilirubins (ALK [id number]-->727, AST 1193-->613, ALT 462-->266, Total Bilirubin 20.2-->16.5) slowly downtrending.  Albumin at 1.8 and INR 1.6. Taper pain meds-d/c morphine. Now tolerating regular diet. Per GI , patient will need KUB in 2 weeks for pancreatic stent follow-up ([facility] will arrange).Check LFTs in 2 weeks (patient may stop in [place] lab at [place]). Repeat ERCP in 3 months ([place] will arrange).  AKI with Non anionic gap metabolic acidosis, mild hyponatremia: AKI could be related to initial hypotension/ATN versus prerenal in the setting of dehydration/diuretic use. HCTZ and Telmisartan on hold.  She did have urinary retention 5/9 and Foley  catheter placed.  However noted to have continued worseningof renal function with BUN up to 90s and serum Cr up to2.3 with acidosis. Appreciate nephrology eval--patient started on bicarb supplements.  Urine output low today--plan to resume IVF per d/w Dr Royce Macadamia.ACEI/ARB/diuretics on hold. Uremia could be contributing to overall weakness.    Hx of Parotid/Breast cancer, nowwith diffuse liver metastasis:Continue withanastrazole.Dr. Burr Medico from oncologywill follow patient in the office. 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 Lanell Persons referred her for a guided biopsy of the liver lesions after CT chest for interval screening of history of parotid cancer resulted abnormal.  Bilateral lower extremity weakness: appears worse today and focal in LE. Given urinary incontinence and metastatic disease, imperative to r/o spinal mets at this point. D/W radiology, Oncology and nephrology. MRI of the T and L spine have not demosntrate a cause for bilateral lower extremity weakness. Neurology has been consulted.Continue Foley   Hyponatremia/Hypokalemia:  Etiology likely secondary to hypovolemic hyponatremia in the setting of poor oral intake and decreased appetite over the past week/month. Sodium 129 and potassium 2.6  on admission.Now resolved with IVF/electrolyte replacement  HTN/ dyslipidemia: Home medsTelemisartan and hctz/ statin therapy on hold since admission.Blood pressure improved since admission, low normal without meds.  GERD:Continue with famotidinefamotidine  Moderate malnutrition: in the setting of advanced malignancy. Continue protein supplements, Boost  I  have seen and examined this patient myself. I have spent 32 minutes on her evaluation and care.  DVT prophylaxis: Lovenox Code Status: Full code Family Communication None available Disposition Plan:  The patient is from home. Anticipate discharge to rehab. This has been delayed due to patient's complaints of  lower extremity weakness. Barriers to discharge completion of work up of lower extremity weakness.  Johnnisha Forton, DO [facility] Direct contact: see [email or web address]  7PM-7AM contact night coverage as above 07/28/2019, 3:48 PM  LOS: 8 days

## 2019-07-29 DIAGNOSIS — E871 Hypo-osmolality and hyponatremia: Secondary | ICD-10-CM

## 2019-07-29 DIAGNOSIS — R627 Adult failure to thrive: Secondary | ICD-10-CM

## 2019-07-29 DIAGNOSIS — E876 Hypokalemia: Secondary | ICD-10-CM

## 2019-07-29 DIAGNOSIS — C50919 Malignant neoplasm of unspecified site of unspecified female breast: Secondary | ICD-10-CM

## 2019-07-29 LAB — COMPREHENSIVE METABOLIC PANEL
ALT: 253 U/L — ABNORMAL HIGH (ref 0–44)
AST: 510 U/L — ABNORMAL HIGH (ref 15–41)
Albumin: 1.5 g/dL — ABNORMAL LOW (ref 3.5–5.0)
Alkaline Phosphatase: 644 U/L — ABNORMAL HIGH (ref 38–126)
Anion gap: 14 (ref 5–15)
BUN: 105 mg/dL — ABNORMAL HIGH (ref 8–23)
CO2: 19 mmol/L — ABNORMAL LOW (ref 22–32)
Calcium: 7.7 mg/dL — ABNORMAL LOW (ref 8.9–10.3)
Chloride: 100 mmol/L (ref 98–111)
Creatinine, Ser: 2.17 mg/dL — ABNORMAL HIGH (ref 0.44–1.00)
GFR calc Af Amer: 25 mL/min — ABNORMAL LOW (ref >60)
GFR calc non Af Amer: 22 mL/min — ABNORMAL LOW (ref >60)
Glucose, Bld: 109 mg/dL — ABNORMAL HIGH (ref 70–99)
Potassium: 3.8 mmol/L (ref 3.5–5.1)
Sodium: 133 mmol/L — ABNORMAL LOW (ref 135–145)
Total Bilirubin: 13.4 mg/dL — ABNORMAL HIGH (ref 0.3–1.2)
Total Protein: 4.8 g/dL — ABNORMAL LOW (ref 6.5–8.1)

## 2019-07-29 LAB — CBC WITH DIFFERENTIAL/PLATELET
Abs Immature Granulocytes: 0.37 10*3/uL — ABNORMAL HIGH (ref 0.00–0.07)
Basophils Absolute: 0.1 10*3/uL (ref 0.0–0.1)
Basophils Relative: 1 %
Eosinophils Absolute: 0.2 10*3/uL (ref 0.0–0.5)
Eosinophils Relative: 1 %
HCT: 32.5 % — ABNORMAL LOW (ref 36.0–46.0)
Hemoglobin: 11.1 g/dL — ABNORMAL LOW (ref 12.0–15.0)
Immature Granulocytes: 2 %
Lymphocytes Relative: 2 %
Lymphs Abs: 0.3 10*3/uL — ABNORMAL LOW (ref 0.7–4.0)
MCH: 31.4 pg (ref 26.0–34.0)
MCHC: 34.2 g/dL (ref 30.0–36.0)
MCV: 92.1 fL (ref 80.0–100.0)
Monocytes Absolute: 1.4 10*3/uL — ABNORMAL HIGH (ref 0.1–1.0)
Monocytes Relative: 8 %
Neutro Abs: 15.2 10*3/uL — ABNORMAL HIGH (ref 1.7–7.7)
Neutrophils Relative %: 86 %
Platelets: 198 10*3/uL (ref 150–400)
RBC: 3.53 MIL/uL — ABNORMAL LOW (ref 3.87–5.11)
RDW: 19.1 % — ABNORMAL HIGH (ref 11.5–15.5)
WBC: 17.6 10*3/uL — ABNORMAL HIGH (ref 4.0–10.5)
nRBC: 0.2 % (ref 0.0–0.2)

## 2019-07-29 NOTE — Care Management Important Message (Signed)
Important Message  Patient Details IM Letter given to [place] Case Manager to present to the Patient Name: [name] MRN: [id number] Date of Birth: Jul 16, 1945   Medicare Important Message Given:  Yes     Kerin Salen 07/29/2019, 10:40 AM

## 2019-07-29 NOTE — Progress Notes (Signed)
PROGRESS NOTE    [name]  [id number] DOB: 1945-10-06 DOA: 07/20/2019 PCP: [name], MD    Chief Complaint  Patient presents with  . Abnormal Labs  . Hypotension    Brief Narrative:  74 year old female with history for hypertension, dyslipidemia, breast/parotid cancer and GERD who was recently noted to have new liver lesions on surveillance CT chest presented with hypotension after a ultrasound-guided liver biopsy by IR on the day of admission.Patient reported about 7 days of skin discoloration, associated with nausea, vomiting, decreased appetite and generalized weakness. Her preoperative studies showed hyponatremia, hypokalemia, hyperbilirubinemia and elevated liver enzymes.  ED Course: Afebrile. After ultrasound-guided biopsy,her blood pressure was 66/36, with IV fluids improved to 101/55, heart rate 73, respiratory rate 16, temperature 97.9, oxygen saturation 99%.WBC count 8.8, hemoglobin 15.1, platelet 207. Sodium 129, potassium 2.6, chloride 87, CO2 28, BUN 35, creatinine 1.31, glucose 135. Total bilirubin 22.5, AST 1193, ALT 462. Patient was given 2 L NS bolus, Benadryl.  Hospital course: Patient admitted to [facility] for further evaluation and management,placed on intravenous fluids, close hemodynamic monitoring and [facility] GI consult placed. MRCP 5/4 with widespread metastatic disease to the liver. Dominant index lesion in the right hepatic lobe 4.0 x 3.0 cm. Intrahepatic biliary dilatation with central truncation of the biliary tree.Liver biopsy positive for metastatic breast cancer. ERCP on 5/7 revealed severe biliary stricturing, at the middle and upper third of the main bile duct, dilation of the right and left intrahepatic ducts. Now s/p biliary sphincterectomy/ sphincterectoplasty. Plastic biliary stents were placed at the right, left hepatic ducts and at the ventral pancreatic duct.Patient will need radiographic follow up as outpatient.Patient very weak and  deconditioned, PT/OT recommendation for CIR.MRI of T and L spine did not demonstrate a cause for weakness.   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  Leukocytosis WBC trending up, no fever She does has poor oral intake Repeat cbc in am, may need to start some hydration   AKI in the setting of obstructive jaundice from malignant stricture Metabolic acidosis on bicarb supplement BUN appears up, cr trending down, hope for improvement in the next 24-48hrs Nephrology input appreciated  Urinary incontinence currently with Foley placement  Hyponatremia/hypokalemia Improving  History of parotid gland carcinoma in 2020, status post surgery and radiation  Deconditioning with significant bilateral lower extremity weakness MRI of the T and L spine have not demosntrate a cause for bilateral lower extremity weakness. Neurology consulted, per Dr. Leonel Ramsay from may 12 "Current etiology of her weakness is slightly unclear, given there is no numbness  as I would expect with acute neuropathies.  It is  possible that she has a neuropraxia from immobilization with significant edema in the areas.  The symptoms are relatively symmetric, so I do not think focal lesion such as hematoma or tumor are at all likely.  I do not think that further imaging is at all likely to change management at this time.  I would favor an EMG, but this would be unlikely to be helpful in the acute phase.  For management, I would recommend physical therapy. If she were to have further worsening, or other concerns then further work-up may need to be considered at that time."  DVT prophylaxis: Lovenox Code Status: Full Family Communication: Patient Disposition:   Status is: Inpatient   Dispo: The patient is from: Home              Anticipated d/c is to: CIR versus SNF              Anticipated d/c date is: 2 to 3 days              Patient currently having AKI, leukocytosis, not medically ready to discharge  Consultants:   Nephrology  GI  Neurology  Procedures:   ERCP with stenting  Antimicrobials:   Zosyn     Objective: Vitals:   07/28/19 0640 07/28/19 1316 07/28/19 2109 07/29/19 0513  BP: (!) 103/50 (!) 104/53 (!) 114/55 (!) 105/52  Pulse: 74 84 85 79  Resp: 16 18 17 19   Temp: 98.9 F (37.2 C) 98.3 F (36.8 C) 98.1 F (36.7 C) 98.6 F (37 C)  TempSrc:  Oral Oral Oral  SpO2: 94% 97% 100% 95%  Weight:      Height:        Intake/Output Summary (Last 24 hours) at 07/29/2019 0942 Last data filed at 07/29/2019 0200 Gross per 24 hour  Intake 638.53 ml  Output 1300 ml  Net -661.47 ml   Filed Weights   07/20/19 2051 07/23/19 0653  Weight: 77.1 kg 77.1 kg    Examination:  General exam: Weak , jaundice , aaox3 Respiratory system: Clear to auscultation. Respiratory effort normal. Cardiovascular system: S1 & S2 heard, RRR. No JVD, no murmur, No pedal edema. Gastrointestinal system: Abdomen is nondistended, soft and nontender. No organomegaly  or masses felt. Normal bowel sounds heard. Central nervous system: Alert and oriented. No focal neurological deficits. Extremities: Symmetric , generalized weakness Skin: No rashes, lesions or ulcers.  Positive jaundice Psychiatry: Judgement and insight appear normal. Mood & affect appropriate.     Data Reviewed: I have personally reviewed following labs and imaging studies  CBC: Recent Labs  Lab 07/23/19 0423 07/24/19 0527 07/25/19 0414  WBC 7.3 8.9 12.6*  NEUTROABS 5.7 8.1* 11.2*  HGB 14.7 13.6 12.5  HCT 43.0 39.4 36.4  MCV 89.8 90.8 91.0  PLT 188 183 [id number]    Basic Metabolic Panel: Recent Labs  Lab 07/24/19 0527 07/24/19 0527 07/25/19 0414 07/26/19 0327 07/27/19 0942 07/28/19 0257 07/29/19 0252  NA 135   < > 133* 134* 131* 134* 133*  K 3.9   < > 3.4* 3.5 4.0 3.9 3.8  CL 104   < > 102 102 99 102 100  CO2 21*   < > 19* 19* 19* 20* 19*  GLUCOSE 140*   < > 138* 123* 99 121* 109*  BUN 33*   < > 55* 76* 93* 100* 105*  CREATININE 1.50*   < > 2.23* 2.33* 2.36* 2.32* 2.17*  CALCIUM 8.2*   < > 7.8* 7.9* 8.2*  7.7* 7.7*  MG 2.5*  --   --   --   --   --   --   PHOS  --   --   --   --   --  5.7*  --    < > = values in this interval not displayed.    GFR: Estimated Creatinine Clearance: 23 mL/min (A) (by C-G formula based on SCr of 2.17 mg/dL (H)).  Liver Function Tests: Recent Labs  Lab 07/25/19 0414 07/26/19 0327 07/27/19 0942 07/28/19 0257 07/29/19 0252  AST 613* 626* 652* 515* 510*  ALT 266* 269* 273* 231* 253*  ALKPHOS 824* 727* 679* 573* 644*  BILITOT 16.6* 16.5* 17.3* 13.7* 13.4*  PROT 4.6* 4.4* 5.1* 4.5* 4.8*  ALBUMIN 1.7* 1.8* 1.9* 1.6* 1.5*    CBG: No results for input(s): GLUCAP in the last 168 hours.   Recent Results (from the past 240 hour(s))  Respiratory Panel by RT PCR (Flu A&B, Covid) - Nasopharyngeal Swab     Status: None   Collection Time: 07/20/19  3:21 PM   Specimen: Nasopharyngeal Swab  Result Value Ref Range Status   SARS Coronavirus  2 by RT PCR NEGATIVE NEGATIVE Final    Comment: (NOTE) SARS-CoV-2 target nucleic acids are NOT DETECTED. The SARS-CoV-2 RNA is generally detectable in upper respiratoy specimens during the acute phase of infection. The lowest concentration of SARS-CoV-2 viral copies this assay can detect is 131 copies/mL. A negative result does not preclude SARS-Cov-2 infection and should not be used as the sole basis for treatment or other patient management decisions. A negative result may occur with  improper specimen collection/handling, submission of specimen other than nasopharyngeal swab, presence of viral mutation(s) within the areas targeted by this assay, and inadequate number of viral copies (<131 copies/mL). A negative result must be combined with clinical observations, patient history, and epidemiological information. The expected result is Negative. Fact Sheet for Patients:  PinkCheek.be Fact Sheet for Healthcare Providers:  GravelBags.it This test is not yet ap proved or cleared by the [place] FDA and  has been authorized for detection and/or diagnosis of SARS-CoV-2 by FDA under an Emergency Use Authorization (EUA). This EUA will remain  in effect (meaning this test can be used) for the duration of the COVID-19 declaration under Section 564(b)(1) of the Act, 21 U.S.C. section 360bbb-3(b)(1), unless the authorization is terminated or revoked sooner.    Influenza A by PCR NEGATIVE NEGATIVE Final   Influenza B by PCR NEGATIVE NEGATIVE Final    Comment: (NOTE) The Xpert Xpress SARS-CoV-2/FLU/RSV assay is intended as an aid in  the diagnosis of influenza from Nasopharyngeal swab specimens and  should not be used as a sole basis for treatment. Nasal washings and  aspirates are unacceptable for Xpert Xpress SARS-CoV-2/FLU/RSV  testing. Fact Sheet for Patients: PinkCheek.be Fact Sheet for Healthcare  Providers: GravelBags.it This test is not yet approved or cleared by the [place] FDA and  has been authorized for detection and/or diagnosis of SARS-CoV-2 by  FDA under an Emergency Use Authorization (EUA). This EUA will remain  in effect (meaning this test can be used) for the duration of the  Covid-19 declaration under Section 564(b)(1) of the Act, 21  U.S.C. section 360bbb-3(b)(1), unless the authorization is  terminated or revoked. Performed at [facility], [place] [address] [place], [place] [address]          Radiology Studies: MR THORACIC SPINE WO CONTRAST  Result Date: 07/27/2019 CLINICAL  DATA:  Weakness and inability to walk in a patient with breast cancer and metastatic disease to the liver. EXAM: MRI THORACIC SPINE WITHOUT CONTRAST TECHNIQUE: Multiplanar, multisequence MR imaging of the thoracic spine was performed. No intravenous contrast was administered. COMPARISON:  None. FINDINGS: Alignment:  Normal. Vertebrae: No fracture, evidence of discitis, or bone lesion. Cord:  Normal signal and morphology. Paraspinal and other soft tissues: There are small bilateral pleural effusions, greater on the right. Metastatic deposits in the liver are partially imaged. Disc levels: No disc bulge or protrusion at any level. The central canal and foramina are widely patent throughout. IMPRESSION: Negative for metastatic disease or central canal stenosis. No finding to explain the patient's symptoms. Small bilateral pleural effusions, greater on the right. Metastatic disease in the liver as seen on prior study is noted. Electronically Signed   By: Inge Rise M.D.   On: 07/27/2019 20:41   MR LUMBAR SPINE WO CONTRAST  Result Date: 07/27/2019 CLINICAL DATA:  Weakness and inability to walk in a patient with breast cancer and metastatic disease to the liver. EXAM: MRI LUMBAR SPINE WITHOUT CONTRAST TECHNIQUE: Multiplanar, multisequence MR imaging  of the lumbar spine was performed. No intravenous contrast was administered. COMPARISON:  None. FINDINGS: Segmentation:  Standard. Alignment:  Normal. Vertebrae:  No fracture, evidence of discitis, or bone lesion. Conus medullaris and cauda equina: Conus extends to the L1 level. Conus and cauda equina appear normal. Paraspinal and other soft tissues: Negative. Disc levels: T12-L1: Negative. L1-2: Negative. L2-3: Negative. L3-4: There is a shallow disc bulge without stenosis. L4-5: Moderate to advanced bilateral facet degenerative disease and a shallow disc bulge to the left. Mild central canal stenosis is present. Foramina open. L5-S1: Moderate to advanced facet degenerative change is worse on the left. Shallow disc bulge without stenosis. IMPRESSION: Negative for metastatic disease. No central canal stenosis or nerve root impingement to explain the patient's symptoms. 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 Electronically Signed   By: Inge Rise M.D.   On: 07/27/2019 18:00        Scheduled Meds: . anastrozole  1 mg Oral Daily  . Chlorhexidine Gluconate Cloth  6 each Topical Daily  . enoxaparin (LOVENOX) injection  30 mg Subcutaneous Daily  . famotidine  20 mg Oral Daily  . feeding supplement  1 Container Oral BID BM  . sodium bicarbonate  650 mg Oral TID   Continuous Infusions:   LOS: 9 days     Time spent: 37mins I have personally reviewed and interpreted on  07/29/2019 daily labs,  imagings as discussed above under date review session and assessment and plans.  I reviewed all nursing notes, pharmacy notes, consultant notes,  vitals, pertinent old records  I have discussed plan of care as described above with RN , patient and family on 07/29/2019  Voice Recognition /Dragon dictation system was used to create this note, attempts have been made to correct errors. Please contact the author with questions and/or clarifications.   Florencia Reasons, MD  PhD FACP [facility]  Available via Epic secure chat 7am-7pm for nonurgent issues Please page for urgent issues To page the attending provider between 7A-7P or the covering provider during after hours 7P-7A, please log into the web site [email or web address] and access using universal [place] password for that web site. If you do not have the password, please call the hospital operator.    07/29/2019, 9:42 AM

## 2019-07-29 NOTE — Progress Notes (Signed)
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 UOP good . CR downtrending, BUN very slightly higher  05/11 0701 - 05/12 0700 In: 781.4 [P.O.:240; I.V.:541.4] Out: 1400 [Urine:1400]  Filed Weights   07/20/19 2051 07/23/19 0653  Weight: 77.1 kg 77.1 kg    Scheduled Meds: . anastrozole  1 mg Oral Daily  . Chlorhexidine Gluconate Cloth  6 each Topical Daily  . enoxaparin (LOVENOX) injection  30 mg Subcutaneous Daily  . famotidine  20 mg Oral Daily  . feeding supplement  1 Container Oral BID BM  . sodium bicarbonate  650 mg Oral TID   Continuous Infusions:  PRN Meds:.ondansetron **OR** ondansetron (ZOFRAN) IV, oxyCODONE, senna-docusate  Current Labs: reviewed    Physical Exam:  Blood pressure (!) 104/53, pulse 84, temperature 98.3 F (36.8 C), temperature source Oral, resp. rate 18, height 5\' 5"  (1.651 m), weight 77.1 kg, SpO2 97 %. NAD< lying bed, conversant RRR CTAB S/nt/nd Trace b/l LEE Jaundiced, scleral icterus No asterixus, CN2-12 intact  A/P 1. Nonolguric AKI, appears to be resolving, likley was ATN compounded by thiazide and ARB; neg renal US, UP/C 1.3 in setting of AKI. UNa was < 10 has rec IVFs reported 9.5L positive from admission.  Not uremic. Cr finally downtrending, anticipate BUN to follow in the next couple of days or so.  No indication for HD. 2. Obstructive jaundice s/p ERCP/Stenting 5/7 3. Hyponatremia, stable, very mild 4. HTN BP ok here off meds 5. Metastatic breast cancer, will f/u with oncology 6. Metabolic acidosis, cont NaHCO3 7. B/l LE weakness, neuro seeing, MRI neg for metastatic disease   Madelon Lips MD 07/28/2019, 2:15 PM  Recent Labs  Lab 07/26/19 0327 07/27/19 0942 07/28/19 0257  NA 134* 131* 134*  K 3.5 4.0 3.9  CL 102 99 102  CO2 19* 19* 20*  GLUCOSE 123* 99 121*  BUN 76* 93* 100*  CREATININE 2.33* 2.36* 2.32*   CALCIUM 7.9* 8.2* 7.7*  PHOS  --   --  5.7*   Recent Labs  Lab 07/23/19 0423 07/24/19 0527 07/25/19 0414  WBC 7.3 8.9 12.6*  NEUTROABS 5.7 8.1* 11.2*  HGB 14.7 13.6 12.5  HCT 43.0 39.4 36.4  MCV 89.8 90.8 91.0  PLT 188 183 202

## 2019-07-29 NOTE — Progress Notes (Signed)
Physical Therapy Treatment Patient Details Name: [name] MRN: [id number] DOB: [birth year] Today's Date: 07/29/2019    History of Present Illness 74 yo female admitted with diffuse liver mets, obstructive jaundice with high risk for cholangitis, AKI. S/P ERCP with biliary stent placement 5/7. New onset of weakness, proximal LE weakness > distal LE weakness. Hx of met breast cancer.    PT Comments    Pt assisted with rolling to improve mobility and strengthening.  Pt also able to perform some lower body exercises.  Pt briefly able to maintain sitting without UE support however fatigues quickly.  Continue to recommend CIR upon d/c.  If CIR not an option, then pt will need SNF.  Pt very pleasant and eager to participate and progress.    Follow Up Recommendations  CIR(if not CIR then SNF)     Equipment Recommendations  Rolling walker with 5" wheels;Wheelchair (measurements PT);3in1 (PT)    Recommendations for Other Services       Precautions / Restrictions Precautions Precautions: Fall Precaution Comments: high fall risk    Mobility  Bed Mobility Overal bed mobility: Needs Assistance Bed Mobility: Rolling;Sidelying to Sit;Sit to Sidelying Rolling: Mod assist Sidelying to sit: Mod assist     Sit to sidelying: Mod assist General bed mobility comments: pt performed rolling to each side x2, requiring mod assits to left side and only min to right side, performed sidelying to sit on left for strengthening, pt requiring assist for hips/pelvis positioning  Transfers                    Ambulation/Gait                 Stairs             Wheelchair Mobility    Modified Rankin (Stroke Patients Only)       Balance Overall balance assessment: Needs assistance Sitting-balance support: Feet supported;Bilateral upper extremity supported Sitting balance-Leahy Scale: Fair Sitting balance - Comments: pt able to sit upright without UE support for one minute,  otherwise required bil UE support, posterior lean with fatigue however able to correct with cues, sat EOB for approx 4 minutes and pt fatigued                                    Cognition Arousal/Alertness: Awake/alert Behavior During Therapy: WFL for tasks assessed/performed Overall Cognitive Status: Within Functional Limits for tasks assessed                                        Exercises General Exercises - Lower Extremity Ankle Circles/Pumps: AROM;Both;10 reps Heel Slides: AROM;AAROM;10 reps;Both(AAROM for right LE) Hip ABduction/ADduction: AROM;AAROM;Both;10 reps(AAROM for right LE) Low Level/ICU Exercises Stabilized Bridging: AROM;Both;Supine;10 reps    General Comments        Pertinent Vitals/Pain Pain Assessment: No/denies pain    Home Living                      Prior Function            PT Goals (current goals can now be found in the care plan section) Progress towards PT goals: Progressing toward goals    Frequency    Min 3X/week      PT Plan Current plan remains appropriate  Co-evaluation              AM-PAC PT "6 Clicks" Mobility   Outcome Measure  Help needed turning from your back to your side while in a flat bed without using bedrails?: A Lot Help needed moving from lying on your back to sitting on the side of a flat bed without using bedrails?: A Lot Help needed moving to and from a bed to a chair (including a wheelchair)?: A Lot Help needed standing up from a chair using your arms ([name].g., wheelchair or bedside chair)?: Total Help needed to walk in hospital room?: Total Help needed climbing 3-5 steps with a railing? : Total 6 Click Score: 9    End of Session   Activity Tolerance: Patient tolerated treatment well;Patient limited by fatigue Patient left: in bed;with call bell/phone within reach;with bed alarm set Nurse Communication: Mobility status PT Visit Diagnosis: Difficulty in walking,  not elsewhere classified (R26.2);Muscle weakness (generalized) (M62.81)     Time: 6244-6950 PT Time Calculation (min) (ACUTE ONLY): 26 min  Charges:  $Therapeutic Exercise: 8-22 mins $Therapeutic Activity: 8-22 mins                    Arlyce Dice, DPT Acute Rehabilitation Services Office: [phone]   [name],[name] 07/29/2019, 2:25 PM

## 2019-07-29 NOTE — TOC Progression Note (Signed)
Transition of Care [facility]) - Progression Note    Patient Details  Name: [name] MRN: [id number] Date of Birth: [birth year]  [facility] CM/SW Contact  Lennart Pall,  Phone Number: 07/29/2019, 1:52 PM  Clinical Narrative:   Met with patient who continues with decreased mobility.  Renal service following. Not yet medically ready to transition for rehab.  Pt first choice for rehab still CIR, however, will likely require peer-to-peer with MD for approval.  She is agreeable with SNF as "back up" plan.  Continue to follow.    Expected Discharge Plan: IP Rehab Facility(vs SNF) Barriers to Discharge: Continued Medical Work up  Expected Discharge Plan and Services Expected Discharge Plan: IP Rehab Facility(vs SNF) In-house Referral: Clinical Social Work     Living arrangements for the past 2 months: Single Family Home                                       Social Determinants of Health (SDOH) Interventions    Readmission Risk Interventions No flowsheet data found.

## 2019-07-29 NOTE — Progress Notes (Signed)
Occupational Therapy Treatment Patient Details Name: [name] MRN: [id number] DOB: 01-Nov-1945 Today's Date: 07/29/2019    History of present illness 74 yo female admitted with diffuse liver mets, obstructive jaundice with high risk for cholangitis, AKI. S/P ERCP with biliary stent placement 5/7. New onset of weakness, proximal LE weakness > distal LE weakness. Hx of met breast cancer.   OT comments  [name] continues to present with significant weakness of lower extremities and poor activity tolerance.Treatment focused on strengthening, improving activity tolerance and functional mobility Patient performed upper extremity exercises with theraband (5-10 reps) sitting at side of bed and fatigued quickly. Patient able to initiate scooting up to head of bed but required assistance to complete. Attempts x 2 to come into partial standing by pulling up using recliner but unable but limited by uncontrolled BM. Patient required total assist for pericare at bed level but able to assist with rolling in bed (min-mod assist). Therapist instructed exercises patient could perform at bed level and provided patient with theraband for upper and lower extremities. Patient and spouse verbalized understanding.   Follow Up Recommendations  CIR    Equipment Recommendations       Recommendations for Other Services Rehab consult    Precautions / Restrictions Precautions Precautions: Fall Precaution Comments: high fall risk Restrictions Weight Bearing Restrictions: No       Mobility Bed Mobility Overal bed mobility: Needs Assistance Bed Mobility: Rolling;Supine to Sit;Sit to Supine Rolling: Min assist;Mod assist Sidelying to sit: Mod assist Supine to sit: Max assist Sit to supine: Mod assist Sit to sidelying: Mod assist General bed mobility comments: Patient needed assistance for lower extremities and trunk lift off to transfer into sitting. Patient able to scoot self to edge of bed. With return  to supine patient required mod assist for lower extremities. With pericare at bed level patient able to assist with rolilng - min assist to roll to the left and mod assist to roll to the right.  Transfers                 General transfer comment: Patient scooted self towards head of bed several inches though fatigued quickly. Patient requiered min-mod assist for tactile cues to assist with leaning forward and lifting butt off bed to finish scooting. Attempted x 2 to stand by using recliner to pull up with. Patient able to bear weight through legs and pull buttocks off the bed aprox an inch but unable to come into standing. Patient had bowel movement with attempts at standing and returned to supine for self care task.    Balance       Sitting balance - Comments: Initial imbalance sitting at side of bed but with feet firmly on floor improved.                                   ADL either performed or assessed with clinical judgement   ADL                               Toileting- Clothing Manipulation and Hygiene: Bed level;Total assistance Toileting - Clothing Manipulation Details (indicate cue type and reason): Patient had bowel movement sitting at side of bed. Patient required total assist in bed to perform pericare.             Vision  Perception     Praxis      Cognition Arousal/Alertness: Awake/alert Behavior During Therapy: WFL for tasks assessed/performed Overall Cognitive Status: Within Functional Limits for tasks assessed                                          Exercises Shoulder Exercises Shoulder Flexion: AROM;5 reps;Theraband;Both Theraband Level (Shoulder Flexion): Level 2 (Red) Shoulder Extension: Strengthening;AROM;Both;Theraband Theraband Level (Shoulder Extension): Level 2 (Red) Shoulder ABduction: AROM;5 reps;Both;Strengthening;Theraband Elbow Flexion: Both;10 reps;Theraband Theraband Level (Elbow  Flexion): Level 2 (Red) Elbow Extension: AROM;Both;Theraband;5 reps Theraband Level (Elbow Extension): Level 2 (Red) Other Exercises Other Exercises: Showed patient how to perform hip flexion and knee extension strengthening in bed with orange theraband. Patient performed 5 reps. Encouraged patient to perform 5-10 reps each hour for strengthening. Patient and spouse verbalized understanding. Other Exercises: Encouarged patient to perform ankle pumps while in bed. Patient demonstrated ability.   Shoulder Instructions       General Comments      Pertinent Vitals/ Pain       Pain Assessment: No/denies pain  Home Living                                          Prior Functioning/Environment              Frequency  Min 2X/week        Progress Toward Goals  OT Goals(current goals can now be found in the care plan section)        Plan      Co-evaluation                 AM-PAC OT "6 Clicks" Daily Activity     Outcome Measure                    End of Session    OT Visit Diagnosis: Other abnormalities of gait and mobility (R26.89);Muscle weakness (generalized) (M62.81);Repeated falls (R29.6)   Activity Tolerance Patient tolerated treatment well   Patient Left in bed;with call bell/phone within reach;with bed alarm set;with family/visitor present   Nurse Communication          Time: 1400-1436 OT Time Calculation (min): 36 min  Charges: OT General Charges $OT Visit: 1 Visit OT Treatments $Self Care/Home Management : 23-37 mins  Derl Barrow, OTR/L [place]  Office [phone]    [name] 07/29/2019, 5:07 PM

## 2019-07-30 LAB — CBC WITH DIFFERENTIAL/PLATELET
Abs Immature Granulocytes: 0.29 10*3/uL — ABNORMAL HIGH (ref 0.00–0.07)
Abs Immature Granulocytes: 0.28 10*3/uL — ABNORMAL HIGH (ref 0.00–0.07)
Basophils Absolute: 0.1 10*3/uL (ref 0.0–0.1)
Basophils Absolute: 0.1 10*3/uL (ref 0.0–0.1)
Basophils Relative: 1 %
Basophils Relative: 0 %
Eosinophils Absolute: 0.2 10*3/uL (ref 0.0–0.5)
Eosinophils Absolute: 0.1 10*3/uL (ref 0.0–0.5)
Eosinophils Relative: 1 %
Eosinophils Relative: 1 %
HCT: 29.5 % — ABNORMAL LOW (ref 36.0–46.0)
HCT: 31.5 % — ABNORMAL LOW (ref 36.0–46.0)
Hemoglobin: 10.3 g/dL — ABNORMAL LOW (ref 12.0–15.0)
Hemoglobin: 10.8 g/dL — ABNORMAL LOW (ref 12.0–15.0)
Immature Granulocytes: 2 %
Immature Granulocytes: 1 %
Lymphocytes Relative: 2 %
Lymphocytes Relative: 2 %
Lymphs Abs: 0.4 10*3/uL — ABNORMAL LOW (ref 0.7–4.0)
Lymphs Abs: 0.4 10*3/uL — ABNORMAL LOW (ref 0.7–4.0)
MCH: 31.9 pg (ref 26.0–34.0)
MCH: 32 pg (ref 26.0–34.0)
MCHC: 34.9 g/dL (ref 30.0–36.0)
MCHC: 34.3 g/dL (ref 30.0–36.0)
MCV: 91.3 fL (ref 80.0–100.0)
MCV: 93.2 fL (ref 80.0–100.0)
Monocytes Absolute: 1.4 10*3/uL — ABNORMAL HIGH (ref 0.1–1.0)
Monocytes Absolute: 1.2 10*3/uL — ABNORMAL HIGH (ref 0.1–1.0)
Monocytes Relative: 8 %
Monocytes Relative: 6 %
Neutro Abs: 16.1 10*3/uL — ABNORMAL HIGH (ref 1.7–7.7)
Neutro Abs: 18.6 10*3/uL — ABNORMAL HIGH (ref 1.7–7.7)
Neutrophils Relative %: 86 %
Neutrophils Relative %: 90 %
Platelets: 189 10*3/uL (ref 150–400)
Platelets: 214 10*3/uL (ref 150–400)
RBC: 3.23 MIL/uL — ABNORMAL LOW (ref 3.87–5.11)
RBC: 3.38 MIL/uL — ABNORMAL LOW (ref 3.87–5.11)
RDW: 19.1 % — ABNORMAL HIGH (ref 11.5–15.5)
RDW: 19.9 % — ABNORMAL HIGH (ref 11.5–15.5)
WBC: 18.4 10*3/uL — ABNORMAL HIGH (ref 4.0–10.5)
WBC: 20.7 10*3/uL — ABNORMAL HIGH (ref 4.0–10.5)
nRBC: 0.2 % (ref 0.0–0.2)
nRBC: 0.1 % (ref 0.0–0.2)

## 2019-07-30 LAB — BASIC METABOLIC PANEL
Anion gap: 11 (ref 5–15)
BUN: 93 mg/dL — ABNORMAL HIGH (ref 8–23)
CO2: 19 mmol/L — ABNORMAL LOW (ref 22–32)
Calcium: 7.4 mg/dL — ABNORMAL LOW (ref 8.9–10.3)
Chloride: 100 mmol/L (ref 98–111)
Creatinine, Ser: 1.92 mg/dL — ABNORMAL HIGH (ref 0.44–1.00)
GFR calc Af Amer: 29 mL/min — ABNORMAL LOW (ref >60)
GFR calc non Af Amer: 25 mL/min — ABNORMAL LOW (ref >60)
Glucose, Bld: 121 mg/dL — ABNORMAL HIGH (ref 70–99)
Potassium: 3.3 mmol/L — ABNORMAL LOW (ref 3.5–5.1)
Sodium: 130 mmol/L — ABNORMAL LOW (ref 135–145)

## 2019-07-30 LAB — LACTIC ACID, PLASMA
Lactic Acid, Venous: 2 mmol/L (ref 0.5–1.9)
Lactic Acid, Venous: 2.4 mmol/L (ref 0.5–1.9)

## 2019-07-30 MED ORDER — BISACODYL 10 MG RE SUPP
10.0000 mg | Freq: Once | RECTAL | Status: AC
  Administered 2019-07-30: 10 mg via RECTAL
  Filled 2019-07-30: qty 1

## 2019-07-30 MED ORDER — SODIUM CHLORIDE 0.9 % IV BOLUS
500.0000 mL | Freq: Once | INTRAVENOUS | Status: AC
  Administered 2019-07-30: 500 mL via INTRAVENOUS

## 2019-07-30 MED ORDER — SENNOSIDES-DOCUSATE SODIUM 8.6-50 MG PO TABS
1.0000 | ORAL_TABLET | Freq: Two times a day (BID) | ORAL | Status: DC
  Administered 2019-07-30 – 2019-08-06 (×14): 1 via ORAL
  Filled 2019-07-30 (×15): qty 1

## 2019-07-30 MED ORDER — SODIUM CHLORIDE 0.9 % IV SOLN: INTRAVENOUS | Status: DC

## 2019-07-30 MED ORDER — POTASSIUM CHLORIDE CRYS ER 20 MEQ PO TBCR
40.0000 meq | EXTENDED_RELEASE_TABLET | Freq: Once | ORAL | Status: AC
  Administered 2019-07-30: 40 meq via ORAL
  Filled 2019-07-30: qty 2

## 2019-07-30 NOTE — Progress Notes (Addendum)
[name]   DOB:1946/02/24   ZO#:[id number]   JWJ#:[id number]  Oncology follow up   Subjective: Husband is at the bedside time my visit this morning.  She remains very weak and not really able to stand or walk.  States that she sat on the side the bed yesterday with physical therapy. Husband is trying to help her with exercises on her legs while she is in the bed.  She continues to work with PT/OT.  Denies abdominal pain, nausea, vomiting.  Objective:  Vitals:   07/29/19 2124 07/30/19 0511  BP: (!) 108/53 (!) 99/51  Pulse: 90 75  Resp: 18 18  Temp: 99.1 F (37.3 C) 98.4 F (36.9 C)  SpO2: 96% 97%    Body mass index is 34.63 kg/m.  Intake/Output Summary (Last 24 hours) at 07/30/2019 1128 Last data filed at 07/30/2019 0900 Gross per 24 hour  Intake 520 ml  Output 1400 ml  Net -880 ml     Resting comfortably  (+) jaundice    No peripheral adenopathy  Lungs clear -- no rales or rhonchi  Heart regular rate and rhythm  Abdomen soft nontender   MSK no focal spinal tenderness, no peripheral edema  Neuro nonfocal    CBG (last 3)  No results for input(s): GLUCAP in the last 72 hours.   Labs:  Urine Studies No results for input(s): UHGB, CRYS in the last 72 hours.  Invalid input(s): UACOL, UAPR, USPG, UPH, UTP, UGL, UKET, UBIL, UNIT, UROB, ULEU, UEPI, UWBC, URBC, UBAC, CAST, [place], [place]  Basic Metabolic Panel: Recent Labs  Lab 07/24/19 [phone] 07/25/19 0414 07/26/19 0327 07/26/19 0327 07/27/19 0865 07/27/19 7846 07/28/19 0257 07/28/19 0257 07/29/19 0252 07/30/19 0302  NA 135   < > 134*  --  131*  --  134*  --  133* 130*  K 3.9   < > 3.5   < > 4.0   < > 3.9   < > 3.8 3.3*  CL 104   < > 102  --  99  --  102  --  100 100  CO2 21*   < > 19*  --  19*  --  20*  --  19* 19*  GLUCOSE 140*   < > 123*  --  99  --  121*  --  109* 121*  BUN 33*   < > 76*  --  93*  --  100*  --  105* 93*  CREATININE 1.50*   < > 2.33*  --  2.36*  --  2.32*  --  2.17* 1.92*  CALCIUM 8.2*   < >  7.9*  --  8.2*  --  7.7*  --  7.7* 7.4*  MG 2.5*  --   --   --   --   --   --   --   --   --   PHOS  --   --   --   --   --   --  5.7*  --   --   --    < > = values in this interval not displayed.   GFR Estimated Creatinine Clearance: 28.8 mL/min (A) (by C-G formula based on SCr of 1.92 mg/dL (H)). Liver Function Tests: Recent Labs  Lab 07/25/19 0414 07/26/19 0327 07/27/19 0942 07/28/19 0257 07/29/19 0252  AST 613* 626* 652* 515* 510*  ALT 266* 269* 273* 231* 253*  ALKPHOS 824* 727* 679* 573* 644*  BILITOT 16.6* 16.5* 17.3* 13.7* 13.4*  PROT 4.6* 4.4* 5.1* 4.5* 4.8*  ALBUMIN 1.7* 1.8* 1.9* 1.6* 1.5*   Recent Labs  Lab 07/24/19 0527  LIPASE 105*   No results for input(s): AMMONIA in the last 168 hours. Coagulation profile No results for input(s): INR, PROTIME in the last 168 hours.  CBC: Recent Labs  Lab 07/24/19 0527 07/25/19 0414 07/29/19 1005 07/30/19 0302  WBC 8.9 12.6* 17.6* 18.4*  NEUTROABS 8.1* 11.2* 15.2* 16.1*  HGB 13.6 12.5 11.1* 10.3*  HCT 39.4 36.4 32.5* 29.5*  MCV 90.8 91.0 92.1 91.3  PLT 183 202 198 189   Cardiac Enzymes: No results for input(s): CKTOTAL, CKMB, CKMBINDEX, TROPONINI in the last 168 hours. BNP: Invalid input(s): POCBNP CBG: No results for input(s): GLUCAP in the last 168 hours. D-Dimer No results for input(s): DDIMER in the last 72 hours. Hgb A1c No results for input(s): HGBA1C in the last 72 hours. Lipid Profile No results for input(s): CHOL, HDL, LDLCALC, TRIG, CHOLHDL, LDLDIRECT in the last 72 hours. Thyroid function studies No results for input(s): TSH, T4TOTAL, T3FREE, THYROIDAB in the last 72 hours.  Invalid input(s): FREET3 Anemia work up No results for input(s): VITAMINB12, FOLATE, FERRITIN, TIBC, IRON, RETICCTPCT in the last 72 hours. Microbiology Recent Results (from the past 240 hour(s))  Respiratory Panel by RT PCR (Flu A&B, Covid) - Nasopharyngeal Swab     Status: None   Collection Time: 07/20/19  3:21 PM    Specimen: Nasopharyngeal Swab  Result Value Ref Range Status   SARS Coronavirus 2 by RT PCR NEGATIVE NEGATIVE Final    Comment: (NOTE) SARS-CoV-2 target nucleic acids are NOT DETECTED. The SARS-CoV-2 RNA is generally detectable in upper respiratoy specimens during the acute phase of infection. The lowest concentration of SARS-CoV-2 viral copies this assay can detect is 131 copies/mL. A negative result does not preclude SARS-Cov-2 infection and should not be used as the sole basis for treatment or other patient management decisions. A negative result may occur with  improper specimen collection/handling, submission of specimen other than nasopharyngeal swab, presence of viral mutation(s) within the areas targeted by this assay, and inadequate number of viral copies (<131 copies/mL). A negative result must be combined with clinical observations, patient history, and epidemiological information. The expected result is Negative. Fact Sheet for Patients:  PinkCheek.be Fact Sheet for Healthcare Providers:  GravelBags.it This test is not yet ap proved or cleared by the [place] FDA and  has been authorized for detection and/or diagnosis of SARS-CoV-2 by FDA under an Emergency Use Authorization (EUA). This EUA will remain  in effect (meaning this test can be used) for the duration of the COVID-19 declaration under Section 564(b)(1) of the Act, 21 U.S.C. section 360bbb-3(b)(1), unless the authorization is terminated or revoked sooner.    Influenza A by PCR NEGATIVE NEGATIVE Final   Influenza B by PCR NEGATIVE NEGATIVE Final    Comment: (NOTE) The Xpert Xpress SARS-CoV-2/FLU/RSV assay is intended as an aid in  the diagnosis of influenza from Nasopharyngeal swab specimens and  should not be used as a sole basis for treatment. Nasal washings and  aspirates are unacceptable for Xpert Xpress SARS-CoV-2/FLU/RSV  testing. Fact Sheet  for Patients: PinkCheek.be Fact Sheet for Healthcare Providers: GravelBags.it This test is not yet approved or cleared by the [place] FDA and  has been authorized for detection and/or diagnosis of SARS-CoV-2 by  FDA under an Emergency Use Authorization (EUA). This EUA will remain  in effect (meaning this test can be used) for the  duration of the  Covid-19 declaration under Section 564(b)(1) of the Act, 21  U.S.C. section 360bbb-3(b)(1), unless the authorization is  terminated or revoked. Performed at [facility], [place] [address], [place], [place] [address]       Studies:  No results found.  Assessment: 74 y.o. Caucasian female, with past medical history of breast cancer and parotid gland carcinoma  1. 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  AKI with hyponatremia and hypokalemia 3.  History of stage I right breast cancer in 2019, on adjuvant anastrozole 4.  History of parotid gland carcinoma in 2020, status post surgery and radiation 5. HTN  6.  Deconditioning with significant LE weakness   Recommendations  -Discussed with patient and her husband that lower extremity weakness is thought to be related to edema.  Elevation of legs encouraged.  SCDs in place. -I recommended for her to continue to work with PT/OT.  Disposition plan pending but may go to CIR versus SNF. -Liver function not checked this morning, but yesterday bilirubin was trending downward.  Renal function slowly improving.  She is a poor candidate for cytotoxic chemo, may consider TD-M1 as first line therapy.   Mikey Bussing  07/30/2019   Addendum  I have seen the patient, examined her. I agree with the assessment and and plan and have edited the notes.   Pt has developed leukocytosis and mild elevated lactic acid, no fever, blood in urine  culture was obtained.  Renal function slightly improved, her Foley was removed today.  She did physical therapy in bed with PT today, but is still not able to get out of bed.  I encouraged her to continue physical therapy, her placement is pending, she would benefit from rehab, also I doubt she can tolerate intensive physical therapy.  We will continue follow-up when she is in the hospital. Appreciate the excellent care from her hospitalist team.   Truitt Merle  07/30/2019

## 2019-07-30 NOTE — Progress Notes (Signed)
PROGRESS NOTE    [name]  [id number] DOB: 08/08/1945 DOA: 07/20/2019 PCP: [name], MD    Chief Complaint  Patient presents with   Abnormal Labs   Hypotension    Brief Narrative:  74 year old female with history for hypertension, dyslipidemia, breast/parotid cancer and GERD who was recently noted to have new liver lesions on surveillance CT chest presented with hypotension after a ultrasound-guided liver biopsy by IR on the day of admission.Patient reported about 7 days of skin discoloration, associated with nausea, vomiting, decreased appetite and generalized weakness. Her preoperative studies showed hyponatremia, hypokalemia, hyperbilirubinemia and elevated liver enzymes.  ED Course: Afebrile. After ultrasound-guided biopsy,her blood pressure was 66/36, with IV fluids improved to 101/55, heart rate 73, respiratory rate 16, temperature 97.9, oxygen saturation 99%.WBC count 8.8, hemoglobin 15.1, platelet 207. Sodium 129, potassium 2.6, chloride 87, CO2 28, BUN 35, creatinine 1.31, glucose 135. Total bilirubin 22.5, AST 1193, ALT 462. Patient was given 2 L NS bolus, Benadryl.  Hospital course: Patient admitted to [facility] for further evaluation and management,placed on intravenous fluids, close hemodynamic monitoring and [facility] GI consult placed. MRCP 5/4 with widespread metastatic disease to the liver. Dominant index lesion in the right hepatic lobe 4.0 x 3.0 cm. Intrahepatic biliary dilatation with central truncation of the biliary tree.Liver biopsy positive for metastatic breast cancer. ERCP on 5/7 revealed severe biliary stricturing, at the middle and upper third of the main bile duct, dilation of the right and left intrahepatic ducts. Now s/p biliary sphincterectomy/ sphincterectoplasty. Plastic biliary stents were placed at the right, left hepatic ducts and at the ventral pancreatic duct.Patient will need radiographic follow up as outpatient.Patient very weak and  deconditioned, PT/OT recommendation for CIR.MRI of T and L spine did not demonstrate a cause for weakness.   Subjective:  1.7 L urine output documented last 24 hours, BUN / creatinine trending down   She report feeling tired and remain weak  She denies pain, no sob, no fever Reports no bm for a week Reports poor oral intake Husband at bedside   Assessment & Plan:   Principal Problem:   Obstructive jaundice Active Problems:   Malignant neoplasm of lower-inner quadrant of right breast of female, estrogen receptor positive (HCC)   Cancer of parotid gland (HCC)   Nodule on liver   Hypokalemia   Hyponatremia   Transaminitis   Hypotension   Elevated bilirubin  Diffuse liver metastasis with persistent elevated LFT/obstructive jaundice, metastatic breast cancer Obstructive jaundice due to 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 She currently denies abdominal pain, no fever, but wbc trending up, will order blood culture, ua. Continue to trend lft   AKI in the setting of obstructive jaundice from malignant stricture Metabolic acidosis on bicarb supplement BUN /cr trending down, hope for continued  improvement  Voiding trial today Nephrology input appreciated  Bilateral lower extremity edema Will order venous US to rule out DVT  Urinary incontinence , voiding trial today  Hyponatremia, she does has edema, also has poor oral intake, hopefully sodium will improve as renal function Improves, repeat bmp in am  hypokalemia Remain low, continue replace k  History of parotid gland carcinoma in 2020, status post surgery and  radiation  Deconditioning with significant bilateral lower extremity weakness MRI of the T and L spine have not demosntrate a cause for bilateral lower extremity weakness. Neurology consulted, per Dr. Leonel Ramsay from may 12 "Current etiology of her weakness is slightly unclear, given there is no numbness as I would expect with acute neuropathies.  It is possible that she has a neuropraxia from immobilization with significant edema in the areas.  The symptoms are relatively symmetric, so I do not think focal lesion such as hematoma or tumor are at all likely.  I do not think that further imaging is at all likely to change management at this time.  I would favor an EMG, but this would be unlikely to be helpful in the acute phase.  For management, I would recommend physical therapy. If she were to have further worsening, or other concerns then further work-up may need to be considered at that time."  She remains very weak despite bun /cr /lft improvement, Will order ck  Constipation, start stool softener  DVT prophylaxis: Lovenox Code Status: Full Family Communication: husband at bedside  Disposition:   Status is: Inpatient   Dispo: The patient is from: Home              Anticipated d/c is to: CIR versus SNF              Anticipated d/c date is: 2 to 3 days              Patient currently having AKI, leukocytosis, not medically ready to discharge  Consultants:   Nephrology  GI  Neurology  Procedures:   ERCP with stenting  Antimicrobials:   Zosyn     Objective: Vitals:   07/29/19 2124 07/30/19 0511 07/30/19 0642 07/30/19 1406  BP: (!) 108/53 (!) 99/51  (!) 94/47  Pulse: 90 75  78  Resp: 18 18  16   Temp: 99.1 F (37.3 C) 98.4 F (36.9 C)  98 F (36.7 C)  TempSrc: Oral Oral  Oral  SpO2: 96% 97%  96%  Weight:   94.4 kg   Height:        Intake/Output Summary (Last 24 hours) at 07/30/2019 1524 Last data filed at 07/30/2019 1230 Gross per 24 hour  Intake 760 ml   Output 1400 ml  Net -640 ml   Filed Weights   07/20/19 2051 07/23/19 0653 07/30/19 0642  Weight: 77.1 kg 77.1 kg 94.4 kg    Examination:  General exam: Weak , jaundice , aaox3 Respiratory system: Clear to auscultation. Respiratory effort normal. Cardiovascular system: S1 & S2 heard, RRR. No JVD, no murmur, No pedal edema. Gastrointestinal system: Abdomen is nondistended, soft and nontender. No organomegaly or masses felt. Normal bowel sounds heard. Central nervous system: Alert and oriented. No focal neurological deficits. Extremities: bilateral pitting edema , generalized weakness Skin: No rashes, lesions or ulcers.  Positive jaundice Psychiatry: Judgement and insight appear normal. Mood & affect appropriate.     Data Reviewed: I have personally reviewed following labs and imaging studies  CBC: Recent Labs  Lab 07/24/19 0527 07/25/19 0414 07/29/19 1005 07/30/19 0302  WBC 8.9 12.6* 17.6* 18.4*  NEUTROABS 8.1* 11.2* 15.2* 16.1*  HGB 13.6 12.5 11.1* 10.3*  HCT 39.4 36.4 32.5* 29.5*  MCV 90.8 91.0 92.1 91.3  PLT 183 202 198 [id number]    Basic Metabolic Panel: Recent Labs  Lab 07/24/19 0527 07/25/19 0414 07/26/19 0327 07/27/19 0942 07/28/19 0257 07/29/19 0252 07/30/19 0302  NA 135   < >  134* 131* 134* 133* 130*  K 3.9   < > 3.5 4.0 3.9 3.8 3.3*  CL 104   < > 102 99 102 100 100  CO2 21*   < > 19* 19* 20* 19* 19*  GLUCOSE 140*   < > 123* 99 121* 109* 121*  BUN 33*   < > 76* 93* 100* 105* 93*  CREATININE 1.50*   < > 2.33* 2.36* 2.32* 2.17* 1.92*  CALCIUM 8.2*   < > 7.9* 8.2* 7.7* 7.7* 7.4*  MG 2.5*  --   --   --   --   --   --   PHOS  --   --   --   --  5.7*  --   --    < > = values in this interval not displayed.    GFR: Estimated Creatinine Clearance: 28.8 mL/min (A) (by C-G formula based on SCr of 1.92 mg/dL (H)).  Liver Function Tests: Recent Labs  Lab 07/25/19 0414 07/26/19 0327 07/27/19 0942 07/28/19 0257 07/29/19 0252  AST 613* 626* 652* 515* 510*   ALT 266* 269* 273* 231* 253*  ALKPHOS 824* 727* 679* 573* 644*  BILITOT 16.6* 16.5* 17.3* 13.7* 13.4*  PROT 4.6* 4.4* 5.1* 4.5* 4.8*  ALBUMIN 1.7* 1.8* 1.9* 1.6* 1.5*    CBG: No results for input(s): GLUCAP in the last 168 hours.   No results found for this or any previous visit (from the past 240 hour(s)).       Radiology Studies: No results found.      Scheduled Meds:  anastrozole  1 mg Oral Daily   bisacodyl  10 mg Rectal Once   Chlorhexidine Gluconate Cloth  6 each Topical Daily   enoxaparin (LOVENOX) injection  30 mg Subcutaneous Daily   famotidine  20 mg Oral Daily   feeding supplement  1 Container Oral BID BM   senna-docusate  1 tablet Oral BID   sodium bicarbonate  650 mg Oral TID   Continuous Infusions:   LOS: 10 days     Time spent: 75mins I have personally reviewed and interpreted on  07/30/2019 daily labs,  imagings as discussed above under date review session and assessment and plans.  I reviewed all nursing notes, pharmacy notes, consultant notes,  vitals, pertinent old records  I have discussed plan of care as described above with RN , patient and family on 07/30/2019  Voice Recognition /Dragon dictation system was used to create this note, attempts have been made to correct errors. Please contact the author with questions and/or clarifications.   Florencia Reasons, MD PhD FACP [facility]  Available via Epic secure chat 7am-7pm for nonurgent issues Please page for urgent issues To page the attending provider between 7A-7P or the covering provider during after hours 7P-7A, please log into the web site [email or web address] and access using universal [place] password for that web site. If you do not have the password, please call the hospital operator.    07/30/2019, 3:24 PM

## 2019-07-30 NOTE — Progress Notes (Signed)
Admit: 07/20/2019 LOS: 8  [name] AKI in setting of obstructive jaundice from malignant stricture req ERCP with stenting   Subjective:  . BUN/Cr downtrending . Still weak . IVFs restarted early this AM  05/11 0701 - 05/12 0700 In: 781.4 [P.O.:240; I.V.:541.4] Out: 1400 [Urine:1400]  Filed Weights   07/20/19 2051 07/23/19 0653  Weight: 77.1 kg 77.1 kg    Scheduled Meds: . anastrozole  1 mg Oral Daily  . Chlorhexidine Gluconate Cloth  6 each Topical Daily  . enoxaparin (LOVENOX) injection  30 mg Subcutaneous Daily  . famotidine  20 mg Oral Daily  . feeding supplement  1 Container Oral BID BM  . sodium bicarbonate  650 mg Oral TID   Continuous Infusions:  PRN Meds:.ondansetron **OR** ondansetron (ZOFRAN) IV, oxyCODONE, senna-docusate  Current Labs: reviewed    Physical Exam:  Blood pressure (!) 104/53, pulse 84, temperature 98.3 F (36.8 C), temperature source Oral, resp. rate 18, height 5\' 5"  (1.651 m), weight 77.1 kg, SpO2 97 %. NAD< lying bed, conversant RRR CTAB S/nt/nd Trace b/l LEE Jaundiced, scleral icterus No asterixus, CN2-12 intact  A/P 1. Nonolguric AKI, appears to be resolving, likley was ATN compounded by thiazide and ARB; neg renal US, UP/C 1.3 in setting of AKI. UNa was < 10 has rec IVFs reported 9.5L positive from admission.  Not uremic. BUN/Cr downtrending.  Nothing further to add.  Don't feel strongly about IVFs--> would stop if she's eating and drinking well.  Will sign off.  Call for questions. 2. Obstructive jaundice s/p ERCP/Stenting 5/7 3. Hyponatremia, stable, very mild 4. HTN BP ok here off meds 5. Metastatic breast cancer, will f/u with oncology 6. Metabolic acidosis, cont NaHCO3 7. B/l LE weakness, neuro seeing, MRI neg for metastatic disease   Christina Lips MD 07/28/2019, 2:15 PM  Recent Labs  Lab 07/26/19 0327 07/27/19 0942 07/28/19 0257  NA 134* 131* 134*  K 3.5 4.0 3.9  CL 102 99 102  CO2 19* 19* 20*  GLUCOSE 123* 99 121*  BUN  76* 93* 100*  CREATININE 2.33* 2.36* 2.32*  CALCIUM 7.9* 8.2* 7.7*  PHOS  --   --  5.7*   Recent Labs  Lab 07/23/19 0423 07/24/19 0527 07/25/19 0414  WBC 7.3 8.9 12.6*  NEUTROABS 5.7 8.1* 11.2*  HGB 14.7 13.6 12.5  HCT 43.0 39.4 36.4  MCV 89.8 90.8 91.0  PLT 188 183 202

## 2019-07-30 NOTE — Progress Notes (Signed)
CRITICAL VALUE ALERT  Critical Value: Lactic acid: 2.0  Date & Time Notied: [id number] on 07/30/19  Provider Notified: Rufina Falco, NP at [phone] on 07/30/19  Orders Received/Actions taken:  0.9% Sodium Chloride infusion @ 75 mL/hr, continuous Sodium Chloride 0.9% Bolus 500 mL @ 999 mL/hr Lactic acid, Plasma: Once-timed

## 2019-07-31 ENCOUNTER — Inpatient Hospital Stay (HOSPITAL_COMMUNITY): Payer: Medicare PPO

## 2019-07-31 DIAGNOSIS — R609 Edema, unspecified: Secondary | ICD-10-CM

## 2019-07-31 LAB — URINALYSIS, ROUTINE W REFLEX MICROSCOPIC
Glucose, UA: NEGATIVE mg/dL
Ketones, ur: NEGATIVE mg/dL
Leukocytes,Ua: NEGATIVE
Nitrite: NEGATIVE
Protein, ur: NEGATIVE mg/dL
Specific Gravity, Urine: 1.012 (ref 1.005–1.030)
pH: 5 (ref 5.0–8.0)

## 2019-07-31 LAB — COMPREHENSIVE METABOLIC PANEL
ALT: 246 U/L — ABNORMAL HIGH (ref 0–44)
AST: 455 U/L — ABNORMAL HIGH (ref 15–41)
Albumin: 1.3 g/dL — ABNORMAL LOW (ref 3.5–5.0)
Alkaline Phosphatase: 616 U/L — ABNORMAL HIGH (ref 38–126)
Anion gap: 10 (ref 5–15)
BUN: 89 mg/dL — ABNORMAL HIGH (ref 8–23)
CO2: 18 mmol/L — ABNORMAL LOW (ref 22–32)
Calcium: 7.4 mg/dL — ABNORMAL LOW (ref 8.9–10.3)
Chloride: 103 mmol/L (ref 98–111)
Creatinine, Ser: 1.48 mg/dL — ABNORMAL HIGH (ref 0.44–1.00)
GFR calc Af Amer: 40 mL/min — ABNORMAL LOW (ref >60)
GFR calc non Af Amer: 34 mL/min — ABNORMAL LOW (ref >60)
Glucose, Bld: 113 mg/dL — ABNORMAL HIGH (ref 70–99)
Potassium: 3.5 mmol/L (ref 3.5–5.1)
Sodium: 131 mmol/L — ABNORMAL LOW (ref 135–145)
Total Bilirubin: 11.1 mg/dL — ABNORMAL HIGH (ref 0.3–1.2)
Total Protein: 4.3 g/dL — ABNORMAL LOW (ref 6.5–8.1)

## 2019-07-31 LAB — LACTIC ACID, PLASMA: Lactic Acid, Venous: 2.3 mmol/L (ref 0.5–1.9)

## 2019-07-31 LAB — MAGNESIUM: Magnesium: 2.5 mg/dL — ABNORMAL HIGH (ref 1.7–2.4)

## 2019-07-31 LAB — CK: Total CK: 1487 U/L — ABNORMAL HIGH (ref 38–234)

## 2019-07-31 MED ORDER — ENOXAPARIN SODIUM 40 MG/0.4ML ~~LOC~~ SOLN
40.0000 mg | Freq: Every day | SUBCUTANEOUS | Status: DC
  Administered 2019-08-01 – 2019-08-03 (×3): 40 mg via SUBCUTANEOUS
  Filled 2019-07-31 (×3): qty 0.4

## 2019-07-31 MED ORDER — POTASSIUM CHLORIDE CRYS ER 20 MEQ PO TBCR
40.0000 meq | EXTENDED_RELEASE_TABLET | Freq: Once | ORAL | Status: AC
  Administered 2019-07-31: 40 meq via ORAL
  Filled 2019-07-31: qty 2

## 2019-07-31 MED ORDER — TAMSULOSIN HCL 0.4 MG PO CAPS
0.4000 mg | ORAL_CAPSULE | Freq: Every day | ORAL | Status: DC
  Administered 2019-07-31 – 2019-08-04 (×5): 0.4 mg via ORAL
  Filled 2019-07-31 (×5): qty 1

## 2019-07-31 NOTE — Progress Notes (Signed)
Occupational Therapy Treatment Patient Details Name: [name] MRN: [id number] DOB: January 19, 1946 Today's Date: 07/31/2019    History of present illness 74 yo female admitted with diffuse liver mets, obstructive jaundice with high risk for cholangitis, AKI. S/P ERCP with biliary stent placement 5/7. New onset of weakness, proximal LE weakness > distal LE weakness. Hx of met breast cancer.   OT comments  Pt continues to be highly motivated to work with therapies, but stating she is anxious with mobility that she will fall or faint. Keeping eyes closed much of session, but reporting that she felt ok. Pt stood with sara stedy with +2 moderate assistance. Practiced partial sit<>stands while in stedy. Completed grooming activities once seated in chair and agreed to remain up until after lunch. Continue to recommend intensive rehab in CIR.  Follow Up Recommendations  CIR    Equipment Recommendations  Other (comment)(defer to next venue)    Recommendations for Other Services      Precautions / Restrictions Precautions Precautions: Fall Precaution Comments: pt anxious she will faint       Mobility Bed Mobility Overal bed mobility: Needs Assistance Bed Mobility: Supine to Sit     Supine to sit: +2 for physical assistance;Mod assist     General bed mobility comments: cues for technique, increased time and effort, min assist for LEs over EOB, pt pulled up on therapist's hand and used rail to raise trunk  Transfers Overall transfer level: Needs assistance Equipment used: Ambulation equipment used Transfers: Sit to/from Stand Sit to Stand: +2 physical assistance;Mod assist         General transfer comment: assisted with gait belt and pad under hips to stand from elevated bed with +2 mod assist, able to stand from platform of stedy with +2 min guard assist    Balance Overall balance assessment: Needs assistance Sitting-balance support: No upper extremity supported;Feet  supported Sitting balance-Leahy Scale: Fair Sitting balance - Comments: pt initially propping on B UEs, but when requested, able to sit without support statically   Standing balance support: Bilateral upper extremity supported Standing balance-Leahy Scale: Poor Standing balance comment: able to stand hold cross bar of sara stedy with +2 min guard assist                           ADL either performed or assessed with clinical judgement   ADL Overall ADL's : Needs assistance/impaired     Grooming: Oral care;Sitting;Set up;Brushing hair;Moderate assistance               Lower Body Dressing: Total assistance;Bed level                 General ADL Comments: used sara stedy to transfer to chair for ADL     Vision       Perception     Praxis      Cognition Arousal/Alertness: Awake/alert Behavior During Therapy: Anxious Overall Cognitive Status: Within Functional Limits for tasks assessed                                 General Comments: pt keeping eyes closed much of session, although alert and stating she felt ok        Exercises     Shoulder Instructions       General Comments      Pertinent Vitals/ Pain  Pain Assessment: No/denies pain  Home Living                                          Prior Functioning/Environment              Frequency  Min 2X/week        Progress Toward Goals  OT Goals(current goals can now be found in the care plan section)  Progress towards OT goals: Progressing toward goals  Acute Rehab OT Goals Patient Stated Goal: To improve strength in legs OT Goal Formulation: With patient/family Time For Goal Achievement: 08/08/19 Potential to Achieve Goals: [place] Discharge plan remains appropriate    Co-evaluation    PT/OT/SLP Co-Evaluation/Treatment: Yes Reason for Co-Treatment: For patient/therapist safety   OT goals addressed during session: ADL's and  self-care;Strengthening/ROM      AM-PAC OT "6 Clicks" Daily Activity     Outcome Measure   Help from another person eating meals?: None Help from another person taking care of personal grooming?: A Little Help from another person toileting, which includes using toliet, bedpan, or urinal?: Total Help from another person bathing (including washing, rinsing, drying)?: A Lot Help from another person to put on and taking off regular upper body clothing?: A Little Help from another person to put on and taking off regular lower body clothing?: Total 6 Click Score: 14    End of Session    OT Visit Diagnosis: Other abnormalities of gait and mobility (R26.89);Muscle weakness (generalized) (M62.81);Repeated falls (R29.6)   Activity Tolerance Patient tolerated treatment well   Patient Left in chair;with call bell/phone within reach;with chair alarm set   Nurse Communication Need for lift equipment        Time: [phone] OT Time Calculation (min): 26 min  Charges: OT General Charges $OT Visit: 1 Visit OT Treatments $Self Care/Home Management : 8-22 mins  Nestor Lewandowsky, OTR/L Acute Rehabilitation Services Pager: [phone] Office: [phone]   Malka So 07/31/2019, 1:27 PM

## 2019-07-31 NOTE — Progress Notes (Signed)
Bladder scan was performed on patient which resulted in 454 mL. Paged [facility] floor coverage for in and out cath order at 2258. In and out cath order put in by Merlene Laughter, NP. In and out cath results: 620 mL. Urine culture sent to lab. Merlene Laughter, NP made aware.

## 2019-07-31 NOTE — Plan of Care (Signed)
  Problem: Clinical Measurements: Goal: Respiratory complications will improve Outcome: Progressing   Problem: Clinical Measurements: Goal: Cardiovascular complication will be avoided Outcome: Progressing   Problem: Activity: Goal: Risk for activity intolerance will decrease Outcome: Progressing   Problem: Nutrition: Goal: Adequate nutrition will be maintained Outcome: Progressing   Problem: Elimination: Goal: Will not experience complications related to bowel motility Outcome: Progressing   Problem: Pain Managment: Goal: General experience of comfort will improve Outcome: Progressing   Problem: Safety: Goal: Ability to remain free from injury will improve Outcome: Progressing

## 2019-07-31 NOTE — Progress Notes (Signed)
Physical Therapy Treatment Patient Details Name: [name] MRN: [id number] DOB: 05-29-1945 Today's Date: 07/31/2019    History of Present Illness 74 yo female admitted with diffuse liver mets, obstructive jaundice with high risk for cholangitis, AKI. S/P ERCP with biliary stent placement 5/7. New onset of weakness, proximal LE weakness > distal LE weakness. Hx of met breast cancer.    PT Comments    Pt demonstrating good progress today.  Demonstrates improved AROM of bil LE and able to stand with min-mod A x 2 for safety.  Once in standing pt able to maintain for 1-2 minutes with UE support.  Pt reports some nervousness with mobility but was motivated to participate. Attempt to progress to pre-gait/gait as able. Recommend assist of 2 if trying to progress gait.    Follow Up Recommendations  CIR     Equipment Recommendations  Rolling walker with 5" wheels;Wheelchair (measurements PT);3in1 (PT)    Recommendations for Other Services       Precautions / Restrictions Precautions Precautions: Fall Precaution Comments: pt anxious she will faint    Mobility  Bed Mobility Overal bed mobility: Independent Bed Mobility: Supine to Sit     Supine to sit: Min assist;+2 for physical assistance     General bed mobility comments: cues for technique, increased time and effort, min assist for LEs over EOB, pt pulled up on therapist's hand and used rail to raise trunk  Transfers Overall transfer level: Needs assistance Equipment used: Ambulation equipment used Transfers: Sit to/from [facility] Sit to Stand: Mod assist;Min assist;+2 physical assistance;From elevated surface         General transfer comment: assisted with gait belt and pad under hips to stand from elevated bed with +2 light mod assist, able to stand from platform of stedy with +2 min guard assist (performed x 8)  Ambulation/Gait                 Stairs             Wheelchair Mobility    Modified Rankin (Stroke Patients Only)       Balance Overall balance assessment: Needs assistance Sitting-balance support: No upper extremity supported;Feet supported Sitting balance-Leahy Scale: Fair Sitting balance - Comments: pt initially propping on B UEs, but when requested, able to sit without support statically   Standing balance support: Bilateral upper extremity supported Standing balance-Leahy Scale: Poor Standing balance comment: able to stand hold cross bar of sara stedy with +2 min guard assist                            Cognition Arousal/Alertness: Awake/alert Behavior During Therapy: Anxious Overall Cognitive Status: Within Functional Limits for tasks assessed                                 General Comments: pt keeping eyes closed much of session, although alert and stating she felt ok, reports just nervous      Exercises General Exercises - Lower Extremity Ankle Circles/Pumps: AROM;Both;10 reps Long Arc Quad: AROM;Both;10 reps;Seated;Strengthening(orange T band) Heel Slides: AROM;Both;10 reps;Supine Other Exercises Other Exercises: Hamstring curl orange T band bil x 10    General Comments        Pertinent Vitals/Pain Pain Assessment: No/denies pain    Home Living  Prior Function            PT Goals (current goals can now be found in the care plan section) Acute Rehab PT Goals Patient Stated Goal: To improve strength in legs Progress towards PT goals: Progressing toward goals    Frequency    Min 3X/week      PT Plan Current plan remains appropriate    Co-evaluation PT/OT/SLP Co-Evaluation/Treatment: Yes Reason for Co-Treatment: For patient/therapist safety PT goals addressed during session: Mobility/safety with mobility;Strengthening/ROM OT goals addressed during session: ADL's and self-care;Strengthening/ROM      AM-PAC PT "6 Clicks" Mobility   Outcome Measure  Help needed  turning from your back to your side while in a flat bed without using bedrails?: A Lot Help needed moving from lying on your back to sitting on the side of a flat bed without using bedrails?: A Lot Help needed moving to and from a bed to a chair (including a wheelchair)?: A Lot Help needed standing up from a chair using your arms (e.g., wheelchair or bedside chair)?: A Lot Help needed to walk in hospital room?: Total Help needed climbing 3-5 steps with a railing? : Total 6 Click Score: 10    End of Session Equipment Utilized During Treatment: Gait belt Activity Tolerance: Patient tolerated treatment well Patient left: with call bell/phone within reach;with chair alarm set;in chair Nurse Communication: Mobility status PT Visit Diagnosis: Difficulty in walking, not elsewhere classified (R26.2);Muscle weakness (generalized) (M62.81)     Time: 3790-2409 PT Time Calculation (min) (ACUTE ONLY): 36 min  Charges:  $Therapeutic Activity: 8-22 mins                     Maggie Font, PT Acute Rehab Services Pager [phone] [facility] [phone] [facility] [phone]    Karlton Lemon 07/31/2019, 2:03 PM

## 2019-07-31 NOTE — Progress Notes (Signed)
PROGRESS NOTE    [name]  [id number] DOB: 19-Nov-1945 DOA: 07/20/2019 PCP: [name], MD    Chief Complaint  Patient presents with   Abnormal Labs   Hypotension    Brief Narrative:  74 year old female with history for hypertension, dyslipidemia, breast/parotid cancer and GERD who was recently noted to have new liver lesions on surveillance CT chest presented with hypotension after a ultrasound-guided liver biopsy by IR on the day of admission.Patient reported about 7 days of skin discoloration, associated with nausea, vomiting, decreased appetite and generalized weakness. Her preoperative studies showed hyponatremia, hypokalemia, hyperbilirubinemia and elevated liver enzymes.  ED Course: Afebrile. After ultrasound-guided biopsy,her blood pressure was 66/36, with IV fluids improved to 101/55, heart rate 73, respiratory rate 16, temperature 97.9, oxygen saturation 99%.WBC count 8.8, hemoglobin 15.1, platelet 207. Sodium 129, potassium 2.6, chloride 87, CO2 28, BUN 35, creatinine 1.31, glucose 135. Total bilirubin 22.5, AST 1193, ALT 462. Patient was given 2 L NS bolus, Benadryl.  Hospital course: Patient admitted to [facility] for further evaluation and management,placed on intravenous fluids, close hemodynamic monitoring and [facility] GI consult placed. MRCP 5/4 with widespread metastatic disease to the liver. Dominant index lesion in the right hepatic lobe 4.0 x 3.0 cm. Intrahepatic biliary dilatation with central truncation of the biliary tree.Liver biopsy positive for metastatic breast cancer. ERCP on 5/7 revealed severe biliary stricturing, at the middle and upper third of the main bile duct, dilation of the right and left intrahepatic ducts. Now s/p biliary sphincterectomy/ sphincterectoplasty. Plastic biliary stents were placed at the right, left hepatic ducts and at the ventral pancreatic duct.Patient will need radiographic follow up as outpatient.Patient very weak and  deconditioned, PT/OT recommendation for CIR.MRI of T and L spine did not demonstrate a cause for weakness.   Subjective:  Had urinary retention last night required in and out cathx1, report this morning had a bowel movement, spontaneous voiding x2,  BUN / creatinine/lft continue to trend down, she report feeling stronger, with increased appetite  WBC continue to increase, She denies pain, no sob, no fever   Assessment & Plan:   Principal Problem:   Obstructive jaundice Active Problems:   Malignant neoplasm of lower-inner quadrant of right breast of female, estrogen receptor positive ([place])   Cancer of parotid gland (HCC)   Nodule on liver   Hypokalemia   Hyponatremia   Transaminitis   Hypotension   Elevated bilirubin  Diffuse liver metastasis with persistent elevated LFT/obstructive jaundice, metastatic breast cancer Obstructive jaundice due to 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 LFT trending down, denies abdomen pain  Leukocytosis: WBC continue to trend up She currently denies abdominal pain, no fever, repeat blood culture no growth, ua with rare bacteria but no leukocyte, urine culture in process    AKI in the setting of obstructive jaundice from malignant stricture Metabolic acidosis on bicarb supplement BUN /cr continue to down Nephrology input appreciated, nephrology signed off  urinary retention Foley removed on May to 14th, required in and out caths x1 Report no issue of voiding this morning, she is started on Flomax  Bilateral lower extremity edema venous US negative for DVT Elevate legs, Mobilize   Hyponatremia, she does  has edema, also  has poor oral intake, hopefully sodium will improve as renal function Improves, repeat bmp in am  hypokalemia Remain low, continue replace k, repeat lab in the morning  History of parotid gland carcinoma in 2020, status post surgery and radiation  Deconditioning with significant bilateral lower extremity weakness MRI of the T and L spine have not demosntrate a cause for bilateral lower extremity weakness. Neurology consulted, per Dr. Leonel Ramsay from may 12 "Current etiology of her weakness is slightly unclear, given there is no numbness as I would expect with acute neuropathies.  It is possible that she has a neuropraxia from immobilization with significant edema in the areas.  The symptoms are relatively symmetric, so I do not think focal lesion such as hematoma or tumor are at all likely.  I do not think that further imaging is at all likely to change management at this time.  I would favor an EMG, but this would be unlikely to be helpful in the acute phase.  For management, I would recommend physical therapy. If she were to have further worsening, or other concerns then further work-up may need to be considered at that time."  Some improvement on May 15, continue PT OT  Constipation, start stool softener, BM x1 on May 15  DVT prophylaxis: Lovenox Code Status: Full Family Communication: husband at bedside on May 14 Disposition:   Status is: Inpatient   Dispo: The patient is from: Home              Anticipated d/c is to: CIR versus SNF              Anticipated d/c date is: Hopefully on Monday              leukocytosis, need wbc to come down  Consultants:   Nephrology  GI  Neurology  oncology  Procedures:   ERCP with stenting  Antimicrobials:   Zosyn from May 5 to May 10     Objective: Vitals:   07/30/19 1406 07/30/19 1537 07/30/19 2026 07/31/19 0434  BP: (!) 94/47 101/60 (!) 102/48 (!) 98/51  Pulse: 78 82 79 73  Resp: 16  16 14   Temp: 98 F  (36.7 C)  98 F (36.7 C) 98 F (36.7 C)  TempSrc: Oral  Oral Oral  SpO2: 96%  96% 95%  Weight:    94.4 kg  Height:        Intake/Output Summary (Last 24 hours) at 07/31/2019 1552 Last data filed at 07/31/2019 1335 Gross per 24 hour  Intake 1140 ml  Output 1120 ml  Net 20 ml   Filed Weights   07/23/19 0653 07/30/19 0642 07/31/19 0434  Weight: 77.1 kg 94.4 kg 94.4 kg    Examination:  General exam: Weak , jaundice , aaox3 Respiratory system: Clear to auscultation. Respiratory effort normal. Cardiovascular system: S1 & S2 heard, RRR. No JVD, no murmur, No pedal edema. Gastrointestinal system: Abdomen is nondistended, soft and nontender. No organomegaly or masses felt. Normal bowel sounds heard. Central nervous system: Alert and oriented. No focal neurological deficits. Extremities: bilateral pitting edema , generalized weakness Skin: No rashes, lesions or ulcers.  Positive jaundice Psychiatry: Judgement and insight appear normal. Mood & affect appropriate.     Data Reviewed: I have personally reviewed following labs and imaging studies  CBC: Recent Labs  Lab 07/25/19 0414 07/29/19 1005 07/30/19 0302 07/30/19 1509  WBC 12.6* 17.6* 18.4* 20.7*  NEUTROABS 11.2* 15.2* 16.1* 18.6*  HGB 12.5 11.1* 10.3* 10.8*  HCT 36.4 32.5* 29.5* 31.5*  MCV 91.0 92.1 91.3 93.2  PLT 202 198 189 [id number]    Basic Metabolic Panel: Recent Labs  Lab 07/27/19 0942 07/28/19 0257 07/29/19 0252 07/30/19 0302 07/31/19 0305  NA 131* 134* 133* 130* 131*  K 4.0 3.9 3.8 3.3* 3.5  CL 99 102 100 100 103  CO2 19* 20* 19* 19* 18*  GLUCOSE 99 121* 109* 121* 113*  BUN 93* 100* 105* 93* 89*  CREATININE 2.36* 2.32* 2.17* 1.92* 1.48*  CALCIUM 8.2* 7.7* 7.7* 7.4* 7.4*  MG  --   --   --   --  2.5*  PHOS  --  5.7*  --   --   --     GFR: Estimated Creatinine Clearance: 37.3 mL/min (A) (by C-G formula based on SCr of 1.48 mg/dL (H)).  Liver Function Tests: Recent Labs  Lab 07/26/19 0327  07/27/19 0942 07/28/19 0257 07/29/19 0252 07/31/19 0305  AST 626* 652* 515* 510* 455*  ALT 269* 273* 231* 253* 246*  ALKPHOS 727* 679* 573* 644* 616*  BILITOT 16.5* 17.3* 13.7* 13.4* 11.1*  PROT 4.4* 5.1* 4.5* 4.8* 4.3*  ALBUMIN 1.8* 1.9* 1.6* 1.5* 1.3*    CBG: No results for input(s): GLUCAP in the last 168 hours.   Recent Results (from the past 240 hour(s))  Culture, blood (routine x 2)     Status: None (Preliminary result)   Collection Time: 07/30/19  3:09 PM   Specimen: BLOOD  Result Value Ref Range Status   Specimen Description   Final    BLOOD LEFT ANTECUBITAL Performed at [facility] [address] [place], [place] [address]    Special Requests   Final    BOTTLES DRAWN AEROBIC ONLY Blood Culture adequate volume Performed at [facility] [address] [place], [place] [address]    Culture   Final    NO GROWTH < 24 HOURS Performed at [facility] [address], [place], [place] [address]    Report Status PENDING  Incomplete  Culture, blood (routine x 2)     Status: None (Preliminary result)   Collection Time: 07/30/19  3:09 PM   Specimen: BLOOD  Result Value Ref Range Status   Specimen Description   Final    BLOOD BLOOD LEFT HAND Performed at [facility] [address], [place], [place] [address]    Special Requests   Final    BOTTLES DRAWN AEROBIC ONLY Blood Culture adequate volume   Culture   Final    NO GROWTH < 24 HOURS Performed at [facility], [place] [address], [place], [place] [address]    Report Status PENDING  Incomplete         Radiology Studies: VAS [place] LOWER EXTREMITY VENOUS (DVT)  Result Date: 07/31/2019  Lower Venous DVTStudy Indications: Pitting edema.  Comparison Study: none Performing Technologist: June Leap RDMS, RVT  Examination Guidelines: A complete evaluation includes B-mode imaging, spectral Doppler, color Doppler, and power Doppler as needed of all  accessible portions of each vessel. Bilateral testing is considered an integral part of a complete examination. Limited examinations for reoccurring indications may be performed as noted. The reflux portion of the exam is performed with the patient in reverse Trendelenburg.  +---------+---------------+---------+-----------+----------+--------------+  RIGHT     Compressibility Phasicity Spontaneity Properties Thrombus Aging  +---------+---------------+---------+-----------+----------+--------------+  CFV       Full  Yes       Yes                                    +---------+---------------+---------+-----------+----------+--------------+  SFJ       Full                                                             +---------+---------------+---------+-----------+----------+--------------+  FV Prox   Full                                                             +---------+---------------+---------+-----------+----------+--------------+  FV Mid    Full                                                             +---------+---------------+---------+-----------+----------+--------------+  FV Distal Full                                                             +---------+---------------+---------+-----------+----------+--------------+  PFV       Full                                                             +---------+---------------+---------+-----------+----------+--------------+  POP       Full            Yes       Yes                                    +---------+---------------+---------+-----------+----------+--------------+  PTV       Full                                                             +---------+---------------+---------+-----------+----------+--------------+  PERO      Full                                                             +---------+---------------+---------+-----------+----------+--------------+   +---------+---------------+---------+-----------+----------+--------------+   LEFT      Compressibility Phasicity  Spontaneity Properties Thrombus Aging  +---------+---------------+---------+-----------+----------+--------------+  CFV       Full            Yes       Yes                                    +---------+---------------+---------+-----------+----------+--------------+  SFJ       Full                                                             +---------+---------------+---------+-----------+----------+--------------+  FV Prox   Full                                                             +---------+---------------+---------+-----------+----------+--------------+  FV Mid    Full                                                             +---------+---------------+---------+-----------+----------+--------------+  FV Distal Full                                                             +---------+---------------+---------+-----------+----------+--------------+  PFV       Full                                                             +---------+---------------+---------+-----------+----------+--------------+  POP       Full            Yes       Yes                                    +---------+---------------+---------+-----------+----------+--------------+  PTV       Full                                                             +---------+---------------+---------+-----------+----------+--------------+  PERO      Full                                                             +---------+---------------+---------+-----------+----------+--------------+  Summary: BILATERAL: - No evidence of deep vein thrombosis seen in the lower extremities, bilaterally. -No evidence of popliteal cyst, bilaterally.   *See table(s) above for measurements and observations.    Preliminary         Scheduled Meds:  anastrozole  1 mg Oral Daily   Chlorhexidine Gluconate Cloth  6 each Topical Daily   [START ON 08/01/2019] enoxaparin (LOVENOX) injection  40 mg Subcutaneous Daily    famotidine  20 mg Oral Daily   feeding supplement  1 Container Oral BID BM   senna-docusate  1 tablet Oral BID   sodium bicarbonate  650 mg Oral TID   tamsulosin  0.4 mg Oral Daily   Continuous Infusions:   LOS: 11 days     Time spent: 68mins I have personally reviewed and interpreted on  07/31/2019 daily labs,  imagings as discussed above under date review session and assessment and plans.  I reviewed all nursing notes, pharmacy notes, consultant notes,  vitals, pertinent old records  I have discussed plan of care as described above with RN , patient and family on 07/31/2019  Voice Recognition /Dragon dictation system was used to create this note, attempts have been made to correct errors. Please contact the author with questions and/or clarifications.   Florencia Reasons, MD PhD FACP [facility]  Available via Epic secure chat 7am-7pm for nonurgent issues Please page for urgent issues To page the attending provider between 7A-7P or the covering provider during after hours 7P-7A, please log into the web site [email or web address] and access using universal [place] password for that web site. If you do not have the password, please call the hospital operator.    07/31/2019, 3:52 PM

## 2019-08-01 LAB — PROTIME-INR
INR: 1.3 — ABNORMAL HIGH (ref 0.8–1.2)
Prothrombin Time: 15.2 s (ref 11.4–15.2)

## 2019-08-01 LAB — COMPREHENSIVE METABOLIC PANEL
ALT: 239 U/L — ABNORMAL HIGH (ref 0–44)
AST: 414 U/L — ABNORMAL HIGH (ref 15–41)
Albumin: 1.3 g/dL — ABNORMAL LOW (ref 3.5–5.0)
Alkaline Phosphatase: 627 U/L — ABNORMAL HIGH (ref 38–126)
Anion gap: 10 (ref 5–15)
BUN: 99 mg/dL — ABNORMAL HIGH (ref 8–23)
CO2: 21 mmol/L — ABNORMAL LOW (ref 22–32)
Calcium: 7.8 mg/dL — ABNORMAL LOW (ref 8.9–10.3)
Chloride: 102 mmol/L (ref 98–111)
Creatinine, Ser: 1.77 mg/dL — ABNORMAL HIGH (ref 0.44–1.00)
GFR calc Af Amer: 32 mL/min — ABNORMAL LOW (ref >60)
GFR calc non Af Amer: 28 mL/min — ABNORMAL LOW (ref >60)
Glucose, Bld: 96 mg/dL (ref 70–99)
Potassium: 4 mmol/L (ref 3.5–5.1)
Sodium: 133 mmol/L — ABNORMAL LOW (ref 135–145)
Total Bilirubin: 11.3 mg/dL — ABNORMAL HIGH (ref 0.3–1.2)
Total Protein: 4.6 g/dL — ABNORMAL LOW (ref 6.5–8.1)

## 2019-08-01 LAB — URINE CULTURE: Culture: NO GROWTH

## 2019-08-01 LAB — CBC WITH DIFFERENTIAL/PLATELET
Abs Immature Granulocytes: 0.3 10*3/uL — ABNORMAL HIGH (ref 0.00–0.07)
Basophils Absolute: 0 10*3/uL (ref 0.0–0.1)
Basophils Relative: 0 %
Eosinophils Absolute: 0.2 10*3/uL (ref 0.0–0.5)
Eosinophils Relative: 1 %
HCT: 28.7 % — ABNORMAL LOW (ref 36.0–46.0)
Hemoglobin: 9.8 g/dL — ABNORMAL LOW (ref 12.0–15.0)
Immature Granulocytes: 2 %
Lymphocytes Relative: 2 %
Lymphs Abs: 0.4 10*3/uL — ABNORMAL LOW (ref 0.7–4.0)
MCH: 32 pg (ref 26.0–34.0)
MCHC: 34.1 g/dL (ref 30.0–36.0)
MCV: 93.8 fL (ref 80.0–100.0)
Monocytes Absolute: 1 10*3/uL (ref 0.1–1.0)
Monocytes Relative: 6 %
Neutro Abs: 15.9 10*3/uL — ABNORMAL HIGH (ref 1.7–7.7)
Neutrophils Relative %: 89 %
Platelets: 220 10*3/uL (ref 150–400)
RBC: 3.06 MIL/uL — ABNORMAL LOW (ref 3.87–5.11)
RDW: 20.8 % — ABNORMAL HIGH (ref 11.5–15.5)
WBC: 17.8 10*3/uL — ABNORMAL HIGH (ref 4.0–10.5)
nRBC: 0 % (ref 0.0–0.2)

## 2019-08-01 LAB — AMMONIA: Ammonia: 40 umol/L — ABNORMAL HIGH (ref 9–35)

## 2019-08-01 LAB — CK: Total CK: 781 U/L — ABNORMAL HIGH (ref 38–234)

## 2019-08-01 LAB — LACTIC ACID, PLASMA: Lactic Acid, Venous: 1.9 mmol/L (ref 0.5–1.9)

## 2019-08-01 NOTE — Progress Notes (Signed)
PROGRESS NOTE    [name]  [id number] DOB: 1945-12-25 DOA: 07/20/2019 PCP: [name], MD    Chief Complaint  Patient presents with  . Abnormal Labs  . Hypotension    Brief Narrative:  74 year old female with history for hypertension, dyslipidemia, breast/parotid cancer and GERD who was recently noted to have new liver lesions on surveillance CT chest presented with hypotension after a ultrasound-guided liver biopsy by IR on the day of admission.Patient reported about 7 days of skin discoloration, associated with nausea, vomiting, decreased appetite and generalized weakness. Her preoperative studies showed hyponatremia, hypokalemia, hyperbilirubinemia and elevated liver enzymes.  ED Course: Afebrile. After ultrasound-guided biopsy,her blood pressure was 66/36, with IV fluids improved to 101/55, heart rate 73, respiratory rate 16, temperature 97.9, oxygen saturation 99%.WBC count 8.8, hemoglobin 15.1, platelet 207. Sodium 129, potassium 2.6, chloride 87, CO2 28, BUN 35, creatinine 1.31, glucose 135. Total bilirubin 22.5, AST 1193, ALT 462. Patient was given 2 L NS bolus, Benadryl.  Hospital course: Patient admitted to [facility] for further evaluation and management,placed on intravenous fluids, close hemodynamic monitoring and [facility] GI consult placed. MRCP 5/4 with widespread metastatic disease to the liver. Dominant index lesion in the right hepatic lobe 4.0 x 3.0 cm. Intrahepatic biliary dilatation with central truncation of the biliary tree.Liver biopsy positive for metastatic breast cancer. ERCP on 5/7 revealed severe biliary stricturing, at the middle and upper third of the main bile duct, dilation of the right and left intrahepatic ducts. Now s/p biliary sphincterectomy/ sphincterectoplasty. Plastic biliary stents were placed at the right, left hepatic ducts and at the ventral pancreatic duct.Patient will need radiographic follow up as outpatient.Patient very weak and  deconditioned, PT/OT recommendation for CIR.MRI of T and L spine did not demonstrate a cause for weakness.   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 LFT trending down, denies abdomen pain  Leukocytosis: -WBC peaked at 20.7, appears start to trend down -repeat blood culture no growth, ua with rare bacteria but no leukocyte, urine culture no growth, lactic acid is normal today -She currently denies abdominal pain, no fever,  -Monitor off antibiotics ,repeat CBC in the morning   Normocytic anemia -No overt bleeding, monitor CBC  AKI in the setting of obstructive jaundice from malignant stricture Metabolic acidosis improving on bicarb supplement -BUN /cr nadir on 5/15, nephrology signed off - appears to trend back up today, adequate urine output ,post void residual 35 cc -Repeat BMP in the morning, renal dosing meds   urinary retention Appear has resolved,  she is started on Flomax on 5/15, increase activity,   Bilateral lower extremity edema venous US negative for DVT Elevate  legs, Mobilize  Hyponatremia, she does has edema, also has poor oral intake, hopefully sodium will improve as renal function Improves, repeat bmp in am  hypokalemia Replaced and normalized, monitor  History of parotid gland carcinoma in 2020, status post surgery and radiation  Deconditioning with significant bilateral lower extremity weakness MRI of the T and L spine have not demosntrate a cause for bilateral lower extremity weakness. Neurology consulted, per Dr. Leonel Ramsay from may 12 "Current etiology of her weakness is slightly unclear, given there is no numbness as I would expect with acute neuropathies.  It is possible that she has a neuropraxia from immobilization with significant edema in the areas.  The symptoms are relatively symmetric, so I do not think focal lesion such as hematoma or tumor are at all likely.  I do not think that further imaging is at all likely to change management at this time.  I would favor an EMG, but this would be unlikely to be helpful in the acute phase.  For management, I would recommend physical therapy. If she were to have further worsening, or other concerns then further work-up may need to be considered at that time."  Started to improve since May 15, continue PT OT  Constipation, start stool softener, BM x1 on May 15  DVT prophylaxis: Lovenox Code Status: Full Family Communication: husband at bedside on May 14 Disposition:   Status is: Inpatient   Dispo: The patient is from: Home              Anticipated d/c is to: CIR versus SNF              Anticipated d/c date is: Hopefully on Monday                Consultants:   Nephrology  GI  Neurology  oncology  Procedures:   ERCP with stenting  Antimicrobials:   Zosyn from May 5 to May 10     Objective: Vitals:   07/31/19 2139 08/01/19 0106 08/01/19 0539 08/01/19 0655  BP: (!) 98/49 (!) 92/49 (!) 105/53 (!) 105/53  Pulse: 82 76 84 83  Resp: 18 19 18 18   Temp: 98.7 F (37.1  C) 98.6 F (37 C) 97.7 F (36.5 C) 98.6 F (37 C)  TempSrc: Oral Oral Oral Oral  SpO2: 97% 96% 94% 96%  Weight:      Height:        Intake/Output Summary (Last 24 hours) at 08/01/2019 0759 Last data filed at 08/01/2019 0715 Gross per 24 hour  Intake 2640 ml  Output 1600 ml  Net 1040 ml   Filed Weights   07/23/19 0653 07/30/19 0642 07/31/19 0434  Weight: 77.1 kg 94.4 kg 94.4 kg    Examination:  General exam: Weak , jaundice , aaox3 Respiratory system: Clear to auscultation. Respiratory effort normal. Cardiovascular system: S1 & S2 heard, RRR. No JVD, no murmur, No pedal edema. Gastrointestinal system: Abdomen is nondistended, soft and nontender. No organomegaly or masses felt. Normal bowel sounds heard. Central nervous system: Alert and oriented. No focal neurological deficits. Extremities: bilateral pitting edema , generalized weakness Skin: No rashes, lesions or ulcers.  + jaundice Psychiatry: Judgement and insight appear normal. Mood & affect appropriate.     Data Reviewed: I have personally reviewed following labs and imaging studies  CBC: Recent Labs  Lab 07/29/19 1005 07/30/19 0302 07/30/19 1509 08/01/19 0336  WBC 17.6* 18.4* 20.7* 17.8*  NEUTROABS 15.2* 16.1* 18.6* 15.9*  HGB 11.1* 10.3* 10.8* 9.8*  HCT  32.5* 29.5* 31.5* 28.7*  MCV 92.1 91.3 93.2 93.8  PLT 198 189 214 [id number]    Basic Metabolic Panel: Recent Labs  Lab 07/28/19 0257 07/29/19 0252 07/30/19 0302 07/31/19 0305 08/01/19 0336  NA 134* 133* 130* 131* 133*  K 3.9 3.8 3.3* 3.5 4.0  CL 102 100 100 103 102  CO2 20* 19* 19* 18* 21*  GLUCOSE 121* 109* 121* 113* 96  BUN 100* 105* 93* 89* 99*  CREATININE 2.32* 2.17* 1.92* 1.48* 1.77*  CALCIUM 7.7* 7.7* 7.4* 7.4* 7.8*  MG  --   --   --  2.5*  --   PHOS 5.7*  --   --   --   --     GFR: Estimated Creatinine Clearance: 31.2 mL/min (A) (by C-G formula based on SCr of 1.77 mg/dL (H)).  Liver Function Tests: Recent Labs  Lab 07/27/19 0942  07/28/19 0257 07/29/19 0252 07/31/19 0305 08/01/19 0336  AST 652* 515* 510* 455* 414*  ALT 273* 231* 253* 246* 239*  ALKPHOS 679* 573* 644* 616* 627*  BILITOT 17.3* 13.7* 13.4* 11.1* 11.3*  PROT 5.1* 4.5* 4.8* 4.3* 4.6*  ALBUMIN 1.9* 1.6* 1.5* 1.3* 1.3*    CBG: No results for input(s): GLUCAP in the last 168 hours.   Recent Results (from the past 240 hour(s))  Culture, Urine     Status: None   Collection Time: 07/30/19  2:47 PM   Specimen: Urine, Clean Catch  Result Value Ref Range Status   Specimen Description   Final    URINE, CLEAN CATCH Performed at [facility], [place] [address] [place], [place] [address]    Special Requests   Final    NONE Performed at [facility], [place] [address], [place], [place] [address]    Culture   Final    NO GROWTH Performed at [facility], [place] [address] [place], [place] [address]    Report Status 08/01/2019 FINAL  Final  Culture, blood (routine x 2)     Status: None (Preliminary result)   Collection Time: 07/30/19  3:09 PM   Specimen: BLOOD  Result Value Ref Range Status   Specimen Description   Final    BLOOD LEFT ANTECUBITAL Performed at [place] [address], [place], [place] [address]    Special Requests   Final    BOTTLES DRAWN AEROBIC ONLY Blood Culture adequate volume Performed at [place] [address], [place], [place] [address]    Culture   Final    NO GROWTH 2 DAYS Performed at [place] [address], [place], [place] [address]    Report Status PENDING  Incomplete  Culture, blood (routine x 2)     Status: None (Preliminary result)   Collection Time: 07/30/19  3:09 PM   Specimen: BLOOD  Result Value Ref Range Status   Specimen Description   Final    BLOOD BLOOD LEFT HAND Performed at [place] [address] [place], [place] [address]    Special Requests   Final    BOTTLES DRAWN  AEROBIC ONLY Blood Culture adequate volume   Culture   Final    NO GROWTH 2 DAYS Performed at [facility], [place] [address] [place], [place] [address]    Report Status PENDING  Incomplete         Radiology Studies: VAS [place] LOWER EXTREMITY VENOUS (DVT)  Result Date: 07/31/2019  Lower  Venous DVTStudy Indications: Pitting edema.  Comparison Study: none Performing Technologist: June Leap RDMS, RVT  Examination Guidelines: A complete evaluation includes B-mode imaging, spectral Doppler, color Doppler, and power Doppler as needed of all accessible portions of each vessel. Bilateral testing is considered an integral part of a complete examination. Limited examinations for reoccurring indications may be performed as noted. The reflux portion of the exam is performed with the patient in reverse Trendelenburg.  +---------+---------------+---------+-----------+----------+--------------+ RIGHT    CompressibilityPhasicitySpontaneityPropertiesThrombus Aging +---------+---------------+---------+-----------+----------+--------------+ CFV      Full           Yes      Yes                                 +---------+---------------+---------+-----------+----------+--------------+ SFJ      Full                                                        +---------+---------------+---------+-----------+----------+--------------+ FV Prox  Full                                                        +---------+---------------+---------+-----------+----------+--------------+ FV Mid   Full                                                        +---------+---------------+---------+-----------+----------+--------------+ FV DistalFull                                                        +---------+---------------+---------+-----------+----------+--------------+ PFV      Full                                                         +---------+---------------+---------+-----------+----------+--------------+ POP      Full           Yes      Yes                                 +---------+---------------+---------+-----------+----------+--------------+ PTV      Full                                                        +---------+---------------+---------+-----------+----------+--------------+ PERO     Full                                                        +---------+---------------+---------+-----------+----------+--------------+   +---------+---------------+---------+-----------+----------+--------------+  LEFT     CompressibilityPhasicitySpontaneityPropertiesThrombus Aging +---------+---------------+---------+-----------+----------+--------------+ CFV      Full           Yes      Yes                                 +---------+---------------+---------+-----------+----------+--------------+ SFJ      Full                                                        +---------+---------------+---------+-----------+----------+--------------+ FV Prox  Full                                                        +---------+---------------+---------+-----------+----------+--------------+ FV Mid   Full                                                        +---------+---------------+---------+-----------+----------+--------------+ FV DistalFull                                                        +---------+---------------+---------+-----------+----------+--------------+ PFV      Full                                                        +---------+---------------+---------+-----------+----------+--------------+ POP      Full           Yes      Yes                                 +---------+---------------+---------+-----------+----------+--------------+ PTV      Full                                                         +---------+---------------+---------+-----------+----------+--------------+ PERO     Full                                                        +---------+---------------+---------+-----------+----------+--------------+     Summary: BILATERAL: - No evidence of deep vein thrombosis seen in the lower extremities, bilaterally. -No evidence of popliteal cyst, bilaterally.   *See table(s) above for measurements and observations.    Preliminary         Scheduled  Meds: . anastrozole  1 mg Oral Daily  . Chlorhexidine Gluconate Cloth  6 each Topical Daily  . enoxaparin (LOVENOX) injection  40 mg Subcutaneous Daily  . famotidine  20 mg Oral Daily  . feeding supplement  1 Container Oral BID BM  . senna-docusate  1 tablet Oral BID  . sodium bicarbonate  650 mg Oral TID  . tamsulosin  0.4 mg Oral Daily   Continuous Infusions:   LOS: 12 days     Time spent: 93mins I have personally reviewed and interpreted on  08/01/2019 daily labs,  imagings as discussed above under date review session and assessment and plans.  I reviewed all nursing notes, pharmacy notes, consultant notes,  vitals, pertinent old records  I have discussed plan of care as described above with RN , patient and family on 08/01/2019  Voice Recognition /Dragon dictation system was used to create this note, attempts have been made to correct errors. Please contact the author with questions and/or clarifications.   Florencia Reasons, MD PhD FACP [facility]  Available via Epic secure chat 7am-7pm for nonurgent issues Please page for urgent issues To page the attending provider between 7A-7P or the covering provider during after hours 7P-7A, please log into the web site [email or web address] and access using universal [place] password for that web site. If you do not have the password, please call the hospital operator.    08/01/2019, 7:59 AM

## 2019-08-01 NOTE — Plan of Care (Signed)
  Problem: Clinical Measurements: Goal: Respiratory complications will improve Outcome: Progressing   Problem: Clinical Measurements: Goal: Cardiovascular complication will be avoided Outcome: Progressing   Problem: Activity: Goal: Risk for activity intolerance will decrease Outcome: Progressing   Problem: Nutrition: Goal: Adequate nutrition will be maintained Outcome: Progressing   Problem: Coping: Goal: Level of anxiety will decrease Outcome: Progressing   Problem: Elimination: Goal: Will not experience complications related to bowel motility Outcome: Progressing   

## 2019-08-02 ENCOUNTER — Encounter (HOSPITAL_COMMUNITY): Payer: Self-pay | Admitting: Internal Medicine

## 2019-08-02 LAB — COMPREHENSIVE METABOLIC PANEL
ALT: 232 U/L — ABNORMAL HIGH (ref 0–44)
AST: 387 U/L — ABNORMAL HIGH (ref 15–41)
Albumin: 1.3 g/dL — ABNORMAL LOW (ref 3.5–5.0)
Alkaline Phosphatase: 664 U/L — ABNORMAL HIGH (ref 38–126)
Anion gap: 11 (ref 5–15)
BUN: 99 mg/dL — ABNORMAL HIGH (ref 8–23)
CO2: 19 mmol/L — ABNORMAL LOW (ref 22–32)
Calcium: 7.8 mg/dL — ABNORMAL LOW (ref 8.9–10.3)
Chloride: 100 mmol/L (ref 98–111)
Creatinine, Ser: 1.66 mg/dL — ABNORMAL HIGH (ref 0.44–1.00)
GFR calc Af Amer: 35 mL/min — ABNORMAL LOW (ref >60)
GFR calc non Af Amer: 30 mL/min — ABNORMAL LOW (ref >60)
Glucose, Bld: 110 mg/dL — ABNORMAL HIGH (ref 70–99)
Potassium: 3.7 mmol/L (ref 3.5–5.1)
Sodium: 130 mmol/L — ABNORMAL LOW (ref 135–145)
Total Bilirubin: 11.3 mg/dL — ABNORMAL HIGH (ref 0.3–1.2)
Total Protein: 4.6 g/dL — ABNORMAL LOW (ref 6.5–8.1)

## 2019-08-02 LAB — CBC WITH DIFFERENTIAL/PLATELET
Abs Immature Granulocytes: 0.33 10*3/uL — ABNORMAL HIGH (ref 0.00–0.07)
Basophils Absolute: 0.1 10*3/uL (ref 0.0–0.1)
Basophils Relative: 1 %
Eosinophils Absolute: 0.1 10*3/uL (ref 0.0–0.5)
Eosinophils Relative: 1 %
HCT: 28.5 % — ABNORMAL LOW (ref 36.0–46.0)
Hemoglobin: 9.7 g/dL — ABNORMAL LOW (ref 12.0–15.0)
Immature Granulocytes: 2 %
Lymphocytes Relative: 3 %
Lymphs Abs: 0.4 10*3/uL — ABNORMAL LOW (ref 0.7–4.0)
MCH: 32 pg (ref 26.0–34.0)
MCHC: 34 g/dL (ref 30.0–36.0)
MCV: 94.1 fL (ref 80.0–100.0)
Monocytes Absolute: 0.9 10*3/uL (ref 0.1–1.0)
Monocytes Relative: 5 %
Neutro Abs: 15.2 10*3/uL — ABNORMAL HIGH (ref 1.7–7.7)
Neutrophils Relative %: 88 %
Platelets: 219 10*3/uL (ref 150–400)
RBC: 3.03 MIL/uL — ABNORMAL LOW (ref 3.87–5.11)
RDW: 20.7 % — ABNORMAL HIGH (ref 11.5–15.5)
WBC: 17.1 10*3/uL — ABNORMAL HIGH (ref 4.0–10.5)
nRBC: 0 % (ref 0.0–0.2)

## 2019-08-02 LAB — OCCULT BLOOD X 1 CARD TO LAB, STOOL: Fecal Occult Bld: POSITIVE — AB

## 2019-08-02 NOTE — Progress Notes (Signed)
Pt now transferred to [place].  Have spoken with Jerilynn Mages with CIR to request they resubmit auth for pt now that MRI complete and therapies still recommending CIR (vs SNF).  She will do this today and is alerting therapies that she needs both PT and OT documentation today.  Have discussed with Marney Doctor, RN CM who will be assigned moving forward.  Rupinder Livingston, LCSW

## 2019-08-02 NOTE — Care Management Important Message (Signed)
Important Message  Patient Details IM [place] given to Marney Doctor RN Case Manager to present to the Patient Name: [name] MRN: [id number] Date of Birth: 1945-09-01   Medicare Important Message Given:  Yes     Kerin Salen 08/02/2019, 12:27 PM

## 2019-08-02 NOTE — Progress Notes (Signed)
PROGRESS NOTE    [name]  NTI:[id number]  DOB: 1946-01-01  PCP: [name], MD 74 year old female with history for hypertension, dyslipidemia, breast/parotid cancer and GERD who was recently noted to have new liver lesions on surveillance CT chest presented with hypotension after a ultrasound-guided liver biopsy by IR on the day of admission.Patient reported about 7 days of skin discoloration, associated with nausea, vomiting, decreased appetite and generalized weakness. Her preoperative studies showed hyponatremia, hypokalemia, hyperbilirubinemia and elevated liver enzymes.  ED Course: Afebrile. After ultrasound-guided biopsy,her blood pressure was 66/36, with IV fluids improved to 101/55, heart rate 73, respiratory rate 16, temperature 97.9, oxygen saturation 99%.WBC count 8.8, hemoglobin 15.1, platelet 207. Sodium 129, potassium 2.6, chloride 87, CO2 28, BUN 35, creatinine 1.31, glucose 135. Total bilirubin 22.5, AST 1193, ALT 462. Patient was given 2 L NS bolus, Benadryl.  Hospital course: Patient admitted to [facility] for further evaluation and management,placed on intravenous fluids, close hemodynamic monitoring and LB GI consult placed. MRCP 5/4 with widespread metastatic disease to the liver. Dominant index lesion in the right hepatic lobe 4.0 x 3.0 cm. Intrahepatic biliary dilatation with central truncation of the biliary tree.Liver biopsy positive for metastatic breast cancer. ERCP on 5/7 revealed severe biliary stricturing, at the middle and upper third of the main bile duct, dilation of the right and left intrahepatic ducts. Now s/p biliary sphincterectomy/ sphincterectoplasty. Plastic biliary stents were placed at the right, left hepatic ducts and at the ventral pancreatic duct.Patient will need radiographic follow up as outpatient.Patient very weak and deconditioned, PT/OT recommendation for CIR.MRI of T and L spine did not demonstrate a cause for weakness.    Subjective:  Patient known to me from last week.  Husband at bedside and visibly concerned/upset regarding patient's persistent bilateral lower extremity weakness.  Patient has resistance bands on the head end of the bed and is using to exercise/move upper extremities.  She however continues to report lower extremity weakness.  Off IV fluids but noted that she has developed mild edema.  Objective: Vitals:   08/01/19 2236 08/02/19 0538 08/02/19 1131 08/02/19 1434  BP: (!) 95/48 (!) 93/47 101/80 (!) 102/53  Pulse: 80 79 82 87  Resp: _0 Temp: 98.1 F (36.7 C) 98 F (36.7 C) 98 F (36.7 C) 98.1 F (36.7 C)  TempSrc: Oral Oral Oral Oral  SpO2: 97% 96% 95% 95%  Weight:      Height:        Intake/Output Summary (Last 24 hours) at 08/02/2019 1640 Last data filed at 08/02/2019 1020 Gross per 24 hour  Intake 570 ml  Output 850 ml  Net -280 ml   Filed Weights   07/23/19 0653 07/30/19 0642 07/31/19 0434  Weight: 77.1 kg 94.4 kg 94.4 kg    Physical Examination:  General exam: Appears concerned, but in no acute distress.  Improving icterus Respiratory system: Clear to auscultation. Respiratory effort normal. Cardiovascular system: S1 & S2 heard, RRR. No JVD, murmurs, rubs, gallops or clicks.  1+ pitting pedal edema. Gastrointestinal system: Abdomen is nondistended, soft and nontender. Normal bowel sounds heard. Central nervous system: Alert and oriented. No new focal neurological deficits. Extremities: Persistent lower extremity weakness, able to wiggle toes and sensations to crude touch intact, 1+ pitting edema or joint deformities.  Skin: No rashes, lesions or ulcers Psychiatry: Judgement and insight appear normal. Mood & affect appropriate.   Data Reviewed: I have personally reviewed following labs and imaging studies  CBC: Recent Labs  Lab 07/29/19 1005 07/30/19 0302 07/30/19 1509 08/01/19 0336 08/02/19 0249  WBC 17.6* 18.4* 20.7* 17.8* 17.1*  NEUTROABS 15.2*  16.1* 18.6* 15.9* 15.2*  HGB 11.1* 10.3* 10.8* 9.8* 9.7*  HCT 32.5* 29.5* 31.5* 28.7* 28.5*  MCV 92.1 91.3 93.2 93.8 94.1  PLT 198 189 214 220 158   Basic Metabolic Panel: Recent Labs  Lab 07/28/19 0257 07/28/19 0257 07/29/19 0252 07/30/19 0302 07/31/19 0305 08/01/19 0336 08/02/19 0249  NA 134*   < > 133* 130* 131* 133* 130*  K 3.9   < > 3.8 3.3* 3.5 4.0 3.7  CL 102   < > 100 100 103 102 100  CO2 20*   < > 19* 19* 18* 21* 19*  GLUCOSE 121*   < > 109* 121* 113* 96 110*  BUN 100*   < > 105* 93* 89* 99* 99*  CREATININE 2.32*   < > 2.17* 1.92* 1.48* 1.77* 1.66*  CALCIUM 7.7*   < > 7.7* 7.4* 7.4* 7.8* 7.8*  MG  --   --   --   --  2.5*  --   --   PHOS 5.7*  --   --   --   --   --   --    < > = values in this interval not displayed.   GFR: Estimated Creatinine Clearance: 33.3 mL/min (A) (by C-G formula based on SCr of 1.66 mg/dL (H)). Liver Function Tests: Recent Labs  Lab 07/28/19 0257 07/29/19 0252 07/31/19 0305 08/01/19 0336 08/02/19 0249  AST 515* 510* 455* 414* 387*  ALT 231* 253* 246* 239* 232*  ALKPHOS 573* 644* 616* 627* 664*  BILITOT 13.7* 13.4* 11.1* 11.3* 11.3*  PROT 4.5* 4.8* 4.3* 4.6* 4.6*  ALBUMIN 1.6* 1.5* 1.3* 1.3* 1.3*   No results for input(s): LIPASE, AMYLASE in the last 168 hours. Recent Labs  Lab 08/01/19 0336  AMMONIA 40*   Coagulation Profile: Recent Labs  Lab 08/01/19 0336  INR 1.3*   Cardiac Enzymes: Recent Labs  Lab 07/31/19 0305 08/01/19 0336  CKTOTAL 1,487* 781*   BNP (last 3 results) No results for input(s): PROBNP in the last 8760 hours. HbA1C: No results for input(s): HGBA1C in the last 72 hours. CBG: No results for input(s): GLUCAP in the last 168 hours. Lipid Profile: No results for input(s): CHOL, HDL, LDLCALC, TRIG, CHOLHDL, LDLDIRECT in the last 72 hours. Thyroid Function Tests: No results for input(s): TSH, T4TOTAL, FREET4, T3FREE, THYROIDAB in the last 72 hours. Anemia Panel: No results for input(s): VITAMINB12,  FOLATE, FERRITIN, TIBC, IRON, RETICCTPCT in the last 72 hours. Sepsis Labs: Recent Labs  Lab 07/30/19 0302 07/30/19 0733 07/31/19 0305 08/01/19 0336  LATICACIDVEN 2.0* 2.4* 2.3* 1.9    Recent Results (from the past 240 hour(s))  Culture, Urine     Status: None   Collection Time: 07/30/19  2:47 PM   Specimen: Urine, Clean Catch  Result Value Ref Range Status   Specimen Description   Final    URINE, CLEAN CATCH Performed at [facility], [place] [address], [place], [place] [address]    Special Requests   Final    NONE Performed at [facility], [place] [address] [place], [place] [address]    Culture   Final    NO GROWTH Performed at [facility], [place] [address], [place], [place] [address]    Report Status 08/01/2019 FINAL  Final  Culture, blood (routine x 2)     Status:  None (Preliminary result)   Collection Time: 07/30/19  3:09 PM   Specimen: BLOOD  Result Value Ref Range Status   Specimen Description   Final    BLOOD LEFT ANTECUBITAL Performed at [place] [address] [place], [place] [address]    Special Requests   Final    BOTTLES DRAWN AEROBIC ONLY Blood Culture adequate volume Performed at [place] [address] [place], [place] [address]    Culture   Final    NO GROWTH 3 DAYS Performed at [facility], [place] [address], [place], [place] [address]    Report Status PENDING  Incomplete  Culture, blood (routine x 2)     Status: None (Preliminary result)   Collection Time: 07/30/19  3:09 PM   Specimen: BLOOD  Result Value Ref Range Status   Specimen Description   Final    BLOOD BLOOD LEFT HAND Performed at [facility] [address] [place], [place] [address]    Special Requests   Final    BOTTLES DRAWN AEROBIC ONLY Blood Culture adequate volume   Culture   Final    NO GROWTH 3 DAYS Performed at [facility], [place] [address],  [place], [place] [address]    Report Status PENDING  Incomplete      Radiology Studies: No results found.      Scheduled Meds: . anastrozole  1 mg Oral Daily  . Chlorhexidine Gluconate Cloth  6 each Topical Daily  . enoxaparin (LOVENOX) injection  40 mg Subcutaneous Daily  . famotidine  20 mg Oral Daily  . feeding supplement  1 Container Oral BID BM  . senna-docusate  1 tablet Oral BID  . sodium bicarbonate  650 mg Oral TID  . tamsulosin  0.4 mg Oral Daily   Continuous Infusions:   Assessment and plan:   1. 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 Received Zosyn for total of 5 days post procedure.Liver enzymes and bilirubins (ALK phos1064-->600s, AST 1193-->380, ALT 462-->230, Total Bilirubin 20-->11) slowly downtrending.  Albumin at 1.8 and INR 1.6. Now tolerating regular diet. Per GI , patient will need KUB in 2 weeks for pancreatic stent follow-up and repeat ERCP in 3 months ( will arrange).Check LFTs in 2 weeks (patient may stop in [place] lab at [facility]).  2. AKI with Non anionic gap metabolic acidosis. AKI could be related to initial hypotension/ATN versus prerenal in the setting of dehydration/diuretic use.  Home medications HCTZ and Telmisartan been on hold.  She did have urinary retention 5/9 and Foley catheter placed.  However noted to have continued worseningof renal function with BUN up to 90s and serum Cr up to2.3 with acidosis prompting nephrology consultation--patient started on bicarb supplements..ACEI/ARB/diuretics held, contrasted studies avoided and patient received intermittent IV hydration with which her renal function did improve with creatinine downtrending to 1.4.[place] Uremia however has persisted  and could be contributing to overall weakness.    3.Bilateral lower extremity weakness: MRI thoracic and lumbar spine negative for spinal mets or significant findings to suggest canal stenosis.  She did have urinary incontinence last week but now off Foley and able to control bladder.  Has external Foley in place due to limited ambulation.  Lower  extremity weakness more or less has persisted over the last week.  Noted that patient was evaluated by neurology, Dr. Leonel Ramsay who felt presentation likely due to neuropraxia versus myopathy.  CK was elevated at 1487->improved to 780 on repeat.  Husband concerned and wishes to talk to neurology.  Called and discussed with on-call neurologist Dr. Rory Percy as well as neurology APP Mr. Tamala Julian who will follow up with patient/husband today.  Neurology recommends aggressive physical therapy.  EMG/nerve conduction studies as outpatient possibly.  PT to follow-up.  4. Hx of Parotid/Breast cancer, nowwith diffuse liver metastasis.Continue withanastrazole.Dr. Burr Medico from oncologywill follow patient in the office. 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 Lanell Persons referred her for a guided biopsy of the liver lesions after CT chest for interval screening of history of parotid cancer resulted abnormal.  5. Hyponatremia/Hypokalemia:  Etiology likely secondary to hypovolemic hyponatremia in the setting of poor oral intake and decreased appetite over the past week/month. Sodium 129 and potassium 2.6  on admission-improved with IVF and electrolyte replacement respectively.  Sodium however appears to be fluctuating and low again on labs today.  Patient off IV fluids and appears somewhat hypervolemic with leg edema.  Will hold off on diuretics in concern for problem #2.  6. HTN/ dyslipidemia. Home medsTelemisartan and hctz/ statin therapy on hold since admission.Blood pressure improved since admission, low normal without meds.  7. GERD.Continue with  famotidine  8. Moderate malnutrition: in the setting of advanced malignancy. Continue protein supplements, Boost   DVT prophylaxis: Lovenox Code Status: Full code Family / Patient Communication: Discussed with patient and husband at bedside Disposition Plan:   Status is: Inpatient  Remains inpatient appropriate because:Inpatient level of care appropriate due to severity of illness, persistent lower extremity weakness, pending alternate level of care availability (CIR)   Dispo: The patient is from: Home              Anticipated d/c is to: CIR              Anticipated d/c date is: 2 days              Patient currently is not medically stable to d/c.       Time spent: 40 minutes.  Discussed with husband, discussed with neurology    Guilford Shi, MD [facility] Pager in [place]  If 7PM-7AM, please contact night-coverage [email or web address] 08/02/2019, 4:40 PM

## 2019-08-02 NOTE — Progress Notes (Signed)
Physical Therapy Treatment Patient Details Name: [name] MRN: [id number] DOB: [birth year] Today's Date: 08/02/2019    History of Present Illness 74 yo female admitted with diffuse liver mets, obstructive jaundice with high risk for cholangitis, AKI. S/P ERCP with biliary stent placement 5/7. New onset of weakness, proximal LE weakness > distal LE weakness. Hx of met breast cancer.    PT Comments    Pt on air mattress bed and requires + 2 assist for bed mobility. General Comments: AxO x 3 but with a slight delay, thought process, other wise very pleasant and willing.  Assisted OOB to recliner.  General bed mobility comments: pt required increased assist this session to transfer from supine to EOB.  An enormous amount of swelling pt's ABD and lower body inhibits her to be able to self move. Severe posterior lean while EOB and inability to self correct and high risk to hip thrust and slide OOB.  Air matress deflated and Max Assist to correct an upright posture.  Still, pt had to use B UE's on rail to support self.  EOB x 5 min to allow pt to correct and to decrease her fear.General transfer comment: using forward momentum "rocking" tech with Therapist in front "Bear Hug" pt was able to stand and support her own body weight.  Pt did have difficulty flexing forward to get "nose over her toes" due to ABD girth.  Pt was able to take a few side steps while holding to Therapist to recliner.  Then required assist to control desend.  Again, enormous swelling/edema interfere with her abilty to self perform.General Gait Details: unable to attempt gait due to limited transfer ability and anxiety.  Pt did stand twice for peri care as she was having uncontrolled, black stool.  I placed the back of a straight chair in front of her and with rocking forward tech and MAX Assist pt was able to stand from recliner level.  Pt stood > 60 sec for peri care before sitting back down.  Follow Up Recommendations  CIR      Equipment Recommendations  Rolling walker with 5" wheels;Wheelchair (measurements PT);3in1 (PT)    Recommendations for Other Services       Precautions / Restrictions Precautions Precautions: Fall Precaution Comments: anxiety/fear of falling/fear of being dropped/enormous swelling throughout ABD and lower body Restrictions Weight Bearing Restrictions: No Other Position/Activity Restrictions: WBAT    Mobility  Bed Mobility Overal bed mobility: Needs Assistance Bed Mobility: Supine to Sit     Supine to sit: Max assist     General bed mobility comments: pt required increased assist this session to transfer from supine to EOB.  An enormous amount of swelling pt's ABD and lower body inhibits her to be able to self move. Severe posterior lean while EOB and inability to self correct and high risk to hip thrust and slide OOB.  Air matress deflated and Max Assist to correct an upright posture.  Still, pt had to use B UE's on rail to support self.  EOB x 5 min to allow pt to correct and to decrease her fear.  Transfers Overall transfer level: Needs assistance Equipment used: None Transfers: Sit to/from [facility] Sit to Stand: Mod assist Stand pivot transfers: Min assist       General transfer comment: using forward momentum "rocking" tech with Therapist in front "Bear Hug" pt was able to stand and support her own body weight.  Pt did have difficulty flexing forward to get "  nose over her toes" due to ABD girth.  Pt was able to take a few side steps while holding to Therapist to recliner.  Then required assist to control desend.  Again, enormous swelling/edema interfere with her abilty to self perform.  Ambulation/Gait             General Gait Details: unable to attempt gait due to limited transfer ability and anxiety.  Pt did stand twice for peri care as she was having uncontrolled, black stool.  I placed the back of a straight chair in front of her and with  rocking forward tech and MAX Assist pt was able to stand from recliner level.  Pt stood > 60 sec for peri care before sitting back down.   Stairs             Wheelchair Mobility    Modified Rankin (Stroke Patients Only)       Balance                                            Cognition Arousal/Alertness: Awake/alert Behavior During Therapy: Anxious Overall Cognitive Status: Within Functional Limits for tasks assessed                                 General Comments: AxO x 3 but with a slight delay, thought process, other wise very pleasant and willing      Exercises  while seated pt performed 5 reps of LAQ's and 5 reps of hip hicks    General Comments        Pertinent Vitals/Pain Pain Assessment: No/denies pain    Home Living                      Prior Function            PT Goals (current goals can now be found in the care plan section) Progress towards PT goals: Progressing toward goals    Frequency    Min 3X/week      PT Plan Current plan remains appropriate    Co-evaluation              AM-PAC PT "6 Clicks" Mobility   Outcome Measure  Help needed turning from your back to your side while in a flat bed without using bedrails?: Total Help needed moving from lying on your back to sitting on the side of a flat bed without using bedrails?: Total Help needed moving to and from a bed to a chair (including a wheelchair)?: Total Help needed standing up from a chair using your arms (e.g., wheelchair or bedside chair)?: A Lot Help needed to walk in hospital room?: A Lot Help needed climbing 3-5 steps with a railing? : Total 6 Click Score: 8    End of Session Equipment Utilized During Treatment: Gait belt Activity Tolerance: Patient tolerated treatment well Patient left: with call bell/phone within reach;in chair Nurse Communication: Mobility status(use stedy) PT Visit Diagnosis: Difficulty in  walking, not elsewhere classified (R26.2);Muscle weakness (generalized) (M62.81)     Time: 1435-1500 PT Time Calculation (min) (ACUTE ONLY): 25 min  Charges:  $Gait Training: 8-22 mins $Therapeutic Activity: 8-22 mins                     Rica Koyanagi  PTA Acute  Rehabilitation Services Pager      [phone] Office      [phone]

## 2019-08-02 NOTE — Progress Notes (Signed)
Re[port given to Laural Benes patient to go to [facility]

## 2019-08-02 NOTE — Progress Notes (Deleted)
NEUROLOGY PROGRESS NOTE  Subjective: Patient does not feel any weaker than the last time I had seen her.  Exam: Vitals:   08/02/19 0538 08/02/19 1131  BP: (!) 93/47 101/80  Pulse: 79 82  Resp: 16 16  Temp: 98 F (36.7 C) 98 F (36.7 C)  SpO2: 96% 95%    ROS General ROS: Positive for - fatigue Ophthalmic ROS: negative for - blurry vision, double vision, eye pain or loss of vision ENT ROS: negative for - epistaxis, nasal discharge, oral lesions, sore throat, tinnitus or vertigo Respiratory ROS: negative for - cough, hemoptysis, shortness of breath or wheezing Cardiovascular ROS: negative for - chest pain, dyspnea on exertion, edema or irregular heartbeat Gastrointestinal ROS: negative for - abdominal pain, diarrhea, hematemesis, nausea/vomiting or stool incontinence Genito-Urinary ROS: negative for - dysuria, hematuria, incontinence or urinary frequency/urgency Musculoskeletal ROS: Positive for -  muscular weakness Neurological ROS: as noted in HPI Dermatological ROS: negative for rash and skin lesion changes        Physical Exam  Constitutional: Appears well-developed and well-nourished.  Psych: Affect appropriate to situation Eyes: Jaundice HENT: No OP obstrucion Head: Normocephalic.  Cardiovascular: Normal rate and regular rhythm.  Respiratory: Effort normal, non-labored breathing GI: Soft.  No distension. There is no tenderness.  Skin: WDI, jaundice   Neuro:  Mental Status: Alert, oriented, thought content appropriate.  Speech fluent without evidence of aphasia.  Able to follow 3 step commands without difficulty. Cranial Nerves: II:  Visual fields grossly normal,  III,IV, VI: ptosis not present, extra-ocular motions intact bilaterally pupils equal, round, reactive to light and accommodation V,VII: smile symmetric, facial light touch sensation normal bilaterally VIII: hearing normal bilaterally IX,X: Palate rises midline XI: bilateral shoulder shrug XII:  midline tongue extension Motor: Right : Upper extremity    Left:     Upper extremity 5/5 deltoid       5/5 deltoid 5/5 tricep      5/5 tricep 5/5 biceps      5/5 biceps  5/5wrist flexion     5/5 wrist flexion 5/5 wrist extension     5/5 wrist extension 5/5 hand grip      5/5 hand grip  Lower extremity     Lower extremity 5/5 hip flexor      5/5 hip flexor 5/5 hip adductors     1/5 hip adductors 5/5 hip abductors     5/5 hip abductors 5/5 quadricep      5/5 quadriceps  4/5 hamstrings     4/5 hamstrings 5/5 plantar flexion       5/5 plantar flexion 5/5 plantar extension     5/5 plantar extension When asked to raise bilateral legs straight off the bed she had a 1/5 strength bilaterally. Sensory: Pinprick and light touch intact throughout, bilaterally Deep Tendon Reflexes: 2+ and symmetric throughout Plantars: Right: downgoing   Left: downgoing Cerebellar: normal finger-to-nose   Medications:  Scheduled: . anastrozole  1 mg Oral Daily  . Chlorhexidine Gluconate Cloth  6 each Topical Daily  . enoxaparin (LOVENOX) injection  40 mg Subcutaneous Daily  . famotidine  20 mg Oral Daily  . feeding supplement  1 Container Oral BID BM  . senna-docusate  1 tablet Oral BID  . sodium bicarbonate  650 mg Oral TID  . tamsulosin  0.4 mg Oral Daily   Continuous:   Pertinent Labs/Diagnostics: Positive occult blood in stool Sodium 138, BUN 99, creatinine 1.66, albumin 1.3, AST 387, ALT 232 WBC 17.1, hematocrit 28.5,  hemoglobin 3.03 Total CK 781 Ammonia 40   Etta Quill PA-C [facility] [phone]  Assessment/recommendation:  74 year old female with metastatic liver cancer brought to hospital for ERCP.  Post surgery she has been retaining water in her upper thighs and noted a progressive weakness in bilateral lower extremities.  At this time exam is inconsistent and most likely multifactorial deconditioning and effort dependent.  Again patient needs to continue with physical  therapy, along with receive EMG nerve conduction study as outpatient.  No further recommendations.       08/02/2019, 12:27 PM

## 2019-08-02 NOTE — Progress Notes (Signed)
NEUROLOGY PROGRESS NOTE  Subjective: Patient does not feel any weaker than the last time I had seen her.  Exam: Vitals:   08/02/19 0538 08/02/19 1131  BP: (!) 93/47 101/80  Pulse: 79 82  Resp: 16 16  Temp: 98 F (36.7 C) 98 F (36.7 C)  SpO2: 96% 95%    ROS General ROS: Positive for - fatigue Ophthalmic ROS: negative for - blurry vision, double vision, eye pain or loss of vision ENT ROS: negative for - epistaxis, nasal discharge, oral lesions, sore throat, tinnitus or vertigo Respiratory ROS: negative for - cough, hemoptysis, shortness of breath or wheezing Cardiovascular ROS: negative for - chest pain, dyspnea on exertion, edema or irregular heartbeat Gastrointestinal ROS: negative for - abdominal pain, diarrhea, hematemesis, nausea/vomiting or stool incontinence Genito-Urinary ROS: negative for - dysuria, hematuria, incontinence or urinary frequency/urgency Musculoskeletal ROS: Positive for -  muscular weakness Neurological ROS: as noted in HPI Dermatological ROS: negative for rash and skin lesion changes        Physical Exam  Constitutional: Appears well-developed and well-nourished.  Psych: Affect appropriate to situation Eyes: Jaundice HENT: No OP obstrucion Head: Normocephalic.  Cardiovascular: Normal rate and regular rhythm.  Respiratory: Effort normal, non-labored breathing GI: Soft.  No distension. There is no tenderness.  Skin: WDI, jaundice   Neuro:  Mental Status: Alert, oriented, thought content appropriate.  Speech fluent without evidence of aphasia.  Able to follow 3 step commands without difficulty. Cranial Nerves: II:  Visual fields grossly normal,  III,IV, VI: ptosis not present, extra-ocular motions intact bilaterally pupils equal, round, reactive to light and accommodation V,VII: smile symmetric, facial light touch sensation normal bilaterally VIII: hearing normal bilaterally IX,X: Palate rises midline XI: bilateral shoulder shrug XII:  midline tongue extension Motor: Right : Upper extremity    Left:     Upper extremity 5/5 deltoid       5/5 deltoid 5/5 tricep      5/5 tricep 5/5 biceps      5/5 biceps  5/5wrist flexion     5/5 wrist flexion 5/5 wrist extension     5/5 wrist extension 5/5 hand grip      5/5 hand grip  Lower extremity     Lower extremity 5/5 hip flexor      5/5 hip flexor 5/5 hip adductors     1/5 hip adductors 5/5 hip abductors     5/5 hip abductors 5/5 quadricep      5/5 quadriceps  4/5 hamstrings     4/5 hamstrings 5/5 plantar flexion       5/5 plantar flexion 5/5 plantar extension     5/5 plantar extension When asked to raise bilateral legs straight off the bed she had a 1/5 strength bilaterally. Sensory: Pinprick and light touch intact throughout, bilaterally Deep Tendon Reflexes: 2+ and symmetric throughout Plantars: Right: downgoing   Left: downgoing Cerebellar: normal finger-to-nose   Medications:  Scheduled: . anastrozole  1 mg Oral Daily  . Chlorhexidine Gluconate Cloth  6 each Topical Daily  . enoxaparin (LOVENOX) injection  40 mg Subcutaneous Daily  . famotidine  20 mg Oral Daily  . feeding supplement  1 Container Oral BID BM  . senna-docusate  1 tablet Oral BID  . sodium bicarbonate  650 mg Oral TID  . tamsulosin  0.4 mg Oral Daily   Continuous:   Pertinent Labs/Diagnostics: Positive occult blood in stool Sodium 138, BUN 99, creatinine 1.66, albumin 1.3, AST 387, ALT 232 WBC 17.1, hematocrit 28.5,  hemoglobin 3.03 Total CK 781 Ammonia 40   Etta Quill PA-C [facility] [phone]  Assessment/recommendation:  74 year old female with metastatic liver cancer brought to hospital for ERCP.  Post surgery she has been retaining water in her upper thighs and noted a progressive weakness in bilateral lower extremities.  At this time exam is inconsistent and most likely multifactorial deconditioning and effort dependent.  Again patient needs to continue with physical  therapy, along with receive EMG nerve conduction study as outpatient.  No further recommendations.       08/02/2019, 12:27 PM

## 2019-08-02 NOTE — Progress Notes (Signed)
Noted pt had 1 small black tarry stool, provider on call notified; will continue to monitor.

## 2019-08-02 NOTE — Progress Notes (Signed)
Occupational Therapy Treatment Patient Details Name: [name] MRN: [id number] DOB: 12/26/1945 Today's Date: 08/02/2019    History of present illness 74 yo female admitted with diffuse liver mets, obstructive jaundice with high risk for cholangitis, AKI. S/P ERCP with biliary stent placement 5/7. New onset of weakness, proximal LE weakness > distal LE weakness. Hx of met breast cancer.   OT comments  Pt very willing and motivated with OT . Pt wants to get back to I.   Follow Up Recommendations  CIR    Equipment Recommendations  Other (comment)(defer to next venue)    Recommendations for Other Services      Precautions / Restrictions Precautions Precautions: Fall Precaution Comments: anxiety/fear of falling/fear of being dropped/enormous swelling throughout ABD and lower body Restrictions Weight Bearing Restrictions: No Other Position/Activity Restrictions: WBAT       Mobility Bed Mobility Overal bed mobility: Needs Assistance Bed Mobility: Supine to Sit     Supine to sit: Max assist     General bed mobility comments: .  Transfers Overall transfer level: Needs assistance Equipment used: None Transfers: Sit to/from [facility] Sit to Stand: Mod assist Stand pivot transfers: Min assist       General transfer comment: .        ADL either performed or assessed with clinical judgement   ADL Overall ADL's : Needs assistance/impaired     Grooming: Oral care;Sitting;Set up;Brushing hair Grooming Details (indicate cue type and reason): in CHAIR                               General ADL Comments: Pt sitting in chair  - pt wanted to go back to bed but encouraged pt to stay in chair.  Discussed lifting pt back to bed in one hour with NT.     Vision Baseline Vision/History: No visual deficits            Cognition Arousal/Alertness: Awake/alert Behavior During Therapy: Anxious Overall Cognitive Status: Within Functional  Limits for tasks assessed                                 General Comments: AxO x 3 but with a slight delay, thought process, other wise very pleasant and willing        Exercises Shoulder Exercises Shoulder Flexion: AROM;5 reps;Theraband;Both Theraband Level (Shoulder Flexion): Level 2 (Red) Shoulder Extension: Strengthening;AROM;Both;Theraband Theraband Level (Shoulder Extension): Level 2 (Red) Shoulder ABduction: AROM;5 reps;Both;Strengthening;Theraband Elbow Flexion: Both;10 reps;Theraband Theraband Level (Elbow Flexion): Level 2 (Red) Elbow Extension: AROM;Both;Theraband;5 reps Theraband Level (Elbow Extension): Level 2 (Red)   Shoulder Instructions       General Comments      Pertinent Vitals/ Pain       Pain Assessment: No/denies pain         Frequency  Min 2X/week        Progress Toward Goals  OT Goals(current goals can now be found in the care plan section)  Progress towards OT goals: Progressing toward goals     Plan Discharge plan remains appropriate       AM-PAC OT "6 Clicks" Daily Activity     Outcome Measure   Help from another person eating meals?: None Help from another person taking care of personal grooming?: A Little Help from another person toileting, which includes using toliet, bedpan, or urinal?: Total Help from  another person bathing (including washing, rinsing, drying)?: A Lot Help from another person to put on and taking off regular upper body clothing?: A Little Help from another person to put on and taking off regular lower body clothing?: Total 6 Click Score: 14    End of Session    OT Visit Diagnosis: Other abnormalities of gait and mobility (R26.89);Muscle weakness (generalized) (M62.81);Repeated falls (R29.6)   Activity Tolerance Patient tolerated treatment well   Patient Left in chair;with call bell/phone within reach;with chair alarm set   Nurse Communication Need for lift equipment        Time:  1540-1600 OT Time Calculation (min): 20 min  Charges: OT General Charges $OT Visit: 1 Visit OT Treatments $Self Care/Home Management : 8-22 mins  Kari Baars, [place] [phone] Office- [phone], Edwena Felty D 08/02/2019, 6:12 PM

## 2019-08-03 ENCOUNTER — Inpatient Hospital Stay (HOSPITAL_COMMUNITY): Payer: Medicare PPO

## 2019-08-03 DIAGNOSIS — I361 Nonrheumatic tricuspid (valve) insufficiency: Secondary | ICD-10-CM

## 2019-08-03 LAB — BASIC METABOLIC PANEL
Anion gap: 13 (ref 5–15)
BUN: 90 mg/dL — ABNORMAL HIGH (ref 8–23)
CO2: 17 mmol/L — ABNORMAL LOW (ref 22–32)
Calcium: 8 mg/dL — ABNORMAL LOW (ref 8.9–10.3)
Chloride: 101 mmol/L (ref 98–111)
Creatinine, Ser: 2.18 mg/dL — ABNORMAL HIGH (ref 0.44–1.00)
GFR calc Af Amer: 25 mL/min — ABNORMAL LOW (ref >60)
GFR calc non Af Amer: 21 mL/min — ABNORMAL LOW (ref >60)
Glucose, Bld: 123 mg/dL — ABNORMAL HIGH (ref 70–99)
Potassium: 4.6 mmol/L (ref 3.5–5.1)
Sodium: 131 mmol/L — ABNORMAL LOW (ref 135–145)

## 2019-08-03 LAB — ECHOCARDIOGRAM COMPLETE
Height: 65 in
Weight: 3326.3 oz

## 2019-08-03 LAB — VITAMIN B12: Vitamin B-12: 4286 pg/mL — ABNORMAL HIGH (ref 180–914)

## 2019-08-03 LAB — FOLATE: Folate: 9 ng/mL (ref >5.9)

## 2019-08-03 MED ORDER — ENOXAPARIN SODIUM 30 MG/0.3ML ~~LOC~~ SOLN: 30.0000 mg | Freq: Every day | SUBCUTANEOUS | Status: DC

## 2019-08-03 NOTE — Progress Notes (Addendum)
[name]   DOB:09/04/1945   JO#:[id number]   EBR#:[id number]  Oncology follow up   Subjective: [name] was transferred to 6 E. she remains to be in bed most of the time, with significant leg weakness.  She was able to stand up yesterday, but not able to walk.  She was in recliner for a few hours yesterday.  She is doing exercise in bed.  Husband at the bedside, both patient and her husband are frustrated that she is not able to walk.  She is afebrile, with labile vital sign, appetite is normal.  No pain or other new complaints.  Objective:  Vitals:   08/02/19 2051 08/03/19 0501  BP: (!) 103/54 (!) 98/48  Pulse: 89 79  Resp: 18 18  Temp: 98.8 F (37.1 C) 98.2 F (36.8 C)  SpO2: 94% 92%    Body mass index is 34.6 kg/m.  Intake/Output Summary (Last 24 hours) at 08/03/2019 1007 Last data filed at 08/02/2019 2000 Gross per 24 hour  Intake --  Output 300 ml  Net -300 ml     Resting comfortably  (+) jaundice    No peripheral adenopathy  Lungs clear -- no rales or rhonchi  Heart regular rate and rhythm  Abdomen soft nontender   MSK no focal spinal tenderness, no peripheral edema  Neuro nonfocal    CBG (last 3)  No results for input(s): GLUCAP in the last 72 hours.   Labs:  Urine Studies No results for input(s): UHGB, CRYS in the last 72 hours.  Invalid input(s): UACOL, UAPR, USPG, UPH, UTP, UGL, UKET, UBIL, UNIT, UROB, ULEU, UEPI, UWBC, URBC, UBAC, CAST, [place], [place]  Basic Metabolic Panel: Recent Labs  Lab 07/28/19 0257 07/29/19 0252 07/30/19 0302 07/30/19 0302 07/31/19 0305 07/31/19 0305 08/01/19 0336 08/01/19 0336 08/02/19 0249 08/03/19 0517  NA 134*   < > 130*  --  131*  --  133*  --  130* 131*  K 3.9   < > 3.3*   < > 3.5   < > 4.0   < > 3.7 4.6  CL 102   < > 100  --  103  --  102  --  100 101  CO2 20*   < > 19*  --  18*  --  21*  --  19* 17*  GLUCOSE 121*   < > 121*  --  113*  --  96  --  110* 123*  BUN 100*   < > 93*  --  89*  --  99*  --  99* 90*   CREATININE 2.32*   < > 1.92*  --  1.48*  --  1.77*  --  1.66* 2.18*  CALCIUM 7.7*   < > 7.4*  --  7.4*  --  7.8*  --  7.8* 8.0*  MG  --   --   --   --  2.5*  --   --   --   --   --   PHOS 5.7*  --   --   --   --   --   --   --   --   --    < > = values in this interval not displayed.   GFR Estimated Creatinine Clearance: 25.3 mL/min (A) (by C-G formula based on SCr of 2.18 mg/dL (H)). Liver Function Tests: Recent Labs  Lab 07/28/19 0257 07/29/19 0252 07/31/19 0305 08/01/19 0336 08/02/19 0249  AST 515* 510* 455* 414* 387*  ALT  231* 253* 246* 239* 232*  ALKPHOS 573* 644* 616* 627* 664*  BILITOT 13.7* 13.4* 11.1* 11.3* 11.3*  PROT 4.5* 4.8* 4.3* 4.6* 4.6*  ALBUMIN 1.6* 1.5* 1.3* 1.3* 1.3*   No results for input(s): LIPASE, AMYLASE in the last 168 hours. Recent Labs  Lab 08/01/19 0336  AMMONIA 40*   Coagulation profile Recent Labs  Lab 08/01/19 0336  INR 1.3*    CBC: Recent Labs  Lab 07/29/19 1005 07/30/19 0302 07/30/19 1509 08/01/19 0336 08/02/19 0249  WBC 17.6* 18.4* 20.7* 17.8* 17.1*  NEUTROABS 15.2* 16.1* 18.6* 15.9* 15.2*  HGB 11.1* 10.3* 10.8* 9.8* 9.7*  HCT 32.5* 29.5* 31.5* 28.7* 28.5*  MCV 92.1 91.3 93.2 93.8 94.1  PLT 198 189 214 220 219   Cardiac Enzymes: Recent Labs  Lab 07/31/19 0305 08/01/19 0336  CKTOTAL 1,487* 781*   BNP: Invalid input(s): POCBNP CBG: No results for input(s): GLUCAP in the last 168 hours. D-Dimer No results for input(s): DDIMER in the last 72 hours. Hgb A1c No results for input(s): HGBA1C in the last 72 hours. Lipid Profile No results for input(s): CHOL, HDL, LDLCALC, TRIG, CHOLHDL, LDLDIRECT in the last 72 hours. Thyroid function studies No results for input(s): TSH, T4TOTAL, T3FREE, THYROIDAB in the last 72 hours.  Invalid input(s): FREET3 Anemia work up No results for input(s): VITAMINB12, FOLATE, FERRITIN, TIBC, IRON, RETICCTPCT in the last 72 hours. Microbiology Recent Results (from the past 240 hour(s))   Culture, Urine     Status: None   Collection Time: 07/30/19  2:47 PM   Specimen: Urine, Clean Catch  Result Value Ref Range Status   Specimen Description   Final    URINE, CLEAN CATCH Performed at [facility], [place] [address], [place], [place] [address]    Special Requests   Final    NONE Performed at [facility], [place] [address] [place], [place] [address]    Culture   Final    NO GROWTH Performed at [facility], [place] [address] [place], [place] [address]    Report Status 08/01/2019 FINAL  Final  Culture, blood (routine x 2)     Status: None (Preliminary result)   Collection Time: 07/30/19  3:09 PM   Specimen: BLOOD  Result Value Ref Range Status   Specimen Description   Final    BLOOD LEFT ANTECUBITAL Performed at [place] [address], [place], [place] [address]    Special Requests   Final    BOTTLES DRAWN AEROBIC ONLY Blood Culture adequate volume Performed at [place] [address] [place], [place] [address]    Culture   Final    NO GROWTH 4 DAYS Performed at [facility], [place] [address], [place], [place] [address]    Report Status PENDING  Incomplete  Culture, blood (routine x 2)     Status: None (Preliminary result)   Collection Time: 07/30/19  3:09 PM   Specimen: BLOOD  Result Value Ref Range Status   Specimen Description   Final    BLOOD BLOOD LEFT HAND Performed at [place] [address], [place], [place] [address]    Special Requests   Final    BOTTLES DRAWN AEROBIC ONLY Blood Culture adequate volume   Culture   Final    NO GROWTH 4 DAYS Performed at [facility], [place] [address] [place], [place] [address]    Report Status PENDING  Incomplete  Studies:  No results found.  Assessment: 74 y.o. Caucasian female, with past medical history of breast cancer and parotid gland carcinoma  1. Diffuse liver metastasis  consistent with metastatic breast cancer with obstructive jaundice, ER/PR negative, HER-2 positive 2.  AKI with hyponatremia and hypokalemia, improving  3.  History of stage I right breast cancer in 2019, on adjuvant anastrozole 4.  History of parotid gland carcinoma in 2020, status post surgery and radiation 5. HTN  6.  Deconditioning with significant LE weakness   Recommendations  -Discussed with patient and her husband that lower extremity weakness is thought to be related to edema and deconditioning.   -Her liver and renal function has improved some, total bilirubin 11.1 this morning. -Since her most of her symptoms are related to her metastatic breast cancer, and she is now making much progress with the supportive care, I recommend her to consider starting cancer treatment in the hospital.  Due to her significant hyperbilirubinemia, and extremely poor performance status, I do not think she is a candidate for cytotoxic chemotherapy.  I recommend TDM 1 (Kadcyla), which is a target therapy of drug conjugate of trastuzumab and chemo emtansine, more tolerable with less side effects.  I reviewed the benefit and the potential side effects, especially reversible cardiomyopathy, cytopenias, worsening liver functions, fatigue, neuropathy, edema etc. with patient and her husband, she is interested. -I have sent a message to our pharmacy and care management team to get her insurance preapproval, plan to give it later this week when it's approved, and if she is still here.  -will get echo to evaluate her baseline heart function.  -will stop anastrozole given her metastatic disease is er-/pr- -will f/u   Truitt Merle  08/03/2019

## 2019-08-03 NOTE — Progress Notes (Signed)
Inpatient Rehab Admissions Coordinator:   Awaiting determination from insurance company re CIR request.  Will continue to follow.   Shann Medal, PT, DPT Admissions Coordinator [phone] 08/03/19  5:09 PM

## 2019-08-03 NOTE — Progress Notes (Signed)
  Echocardiogram 2D Echocardiogram has been performed.  [name] 08/03/2019, 11:37 AM

## 2019-08-03 NOTE — Progress Notes (Signed)
PROGRESS NOTE    [name]  BZJ:[id number]  DOB: 03/11/1946  PCP: [name], MD 74 year old female with history for hypertension, dyslipidemia, breast/parotid cancer and GERD who was recently noted to have new liver lesions on surveillance CT chest presented with hypotension after a ultrasound-guided liver biopsy by IR on the day of admission.Patient reported about 7 days of skin discoloration, associated with nausea, vomiting, decreased appetite and generalized weakness. Her preoperative studies showed hyponatremia, hypokalemia, hyperbilirubinemia and elevated liver enzymes.  ED Course: Afebrile. After ultrasound-guided biopsy,her blood pressure was 66/36, with IV fluids improved to 101/55, heart rate 73, respiratory rate 16, temperature 97.9, oxygen saturation 99%.WBC count 8.8, hemoglobin 15.1, platelet 207. Sodium 129, potassium 2.6, chloride 87, CO2 28, BUN 35, creatinine 1.31, glucose 135. Total bilirubin 22.5, AST 1193, ALT 462. Patient was given 2 L NS bolus, Benadryl.  Hospital course: Patient admitted to [facility] for further evaluation and management,placed on intravenous fluids, close hemodynamic monitoring and LB GI consult placed. MRCP 5/4 with widespread metastatic disease to the liver. Dominant index lesion in the right hepatic lobe 4.0 x 3.0 cm. Intrahepatic biliary dilatation with central truncation of the biliary tree.Liver biopsy positive for metastatic breast cancer. ERCP on 5/7 revealed severe biliary stricturing, at the middle and upper third of the main bile duct, dilation of the right and left intrahepatic ducts. Now s/p biliary sphincterectomy/ sphincterectoplasty. Plastic biliary stents were placed at the right, left hepatic ducts and at the ventral pancreatic duct.Patient will need radiographic follow up as outpatient.Patient very weak and deconditioned, PT/OT recommendation for CIR.MRI of T and L spine did not demonstrate a cause for weakness.    Subjective:  Patient seen by oncology this morning and undergoing bedside echocardiogram . Husband at bedside who reports speaking to neurology yesterday and inquiring about rehab placement. Noted that patient had her right leg partially flexed against gravity while undergoing echo  Objective: Vitals:   08/02/19 1434 08/02/19 2051 08/03/19 0441 08/03/19 0501  BP: (!) 102/53 (!) 103/54  (!) 98/48  Pulse: 87 89  79  Resp: '18 18  18  ' Temp: 98.1 F (36.7 C) 98.8 F (37.1 C)  98.2 F (36.8 C)  TempSrc: Oral Oral  Oral  SpO2: 95% 94%  92%  Weight:   94.3 kg   Height:        Intake/Output Summary (Last 24 hours) at 08/03/2019 1458 Last data filed at 08/03/2019 1304 Gross per 24 hour  Intake --  Output 600 ml  Net -600 ml   Filed Weights   07/30/19 0642 07/31/19 0434 08/03/19 0441  Weight: 94.4 kg 94.4 kg 94.3 kg    Physical Examination:  General exam: Appears concerned, but in no acute distress.  Improving icterus Respiratory system: Clear to auscultation. Respiratory effort normal. Cardiovascular system: S1 & S2 heard, RRR. No JVD, murmurs, rubs, gallops or clicks.  1+ pitting pedal edema. Gastrointestinal system: Abdomen is nondistended, soft and nontender. Normal bowel sounds heard. Central nervous system: Alert and oriented. No new focal neurological deficits. Extremities: Persistent lower extremity weakness with strength 2-3/5 in RLE and 1/5 in LLE, able to wiggle toes and sensations to crude touch intact, 1+ pitting edema or joint deformities.  Skin: No rashes, lesions or ulcers Psychiatry: Judgement and insight appear normal. Mood & affect somewhat flat.   Data Reviewed: I have personally reviewed following labs and imaging studies  CBC: Recent Labs  Lab 07/29/19 1005 07/30/19 0302 07/30/19 1509 08/01/19 0336 08/02/19 0249  WBC 17.6*  18.4* 20.7* 17.8* 17.1*  NEUTROABS 15.2* 16.1* 18.6* 15.9* 15.2*  HGB 11.1* 10.3* 10.8* 9.8* 9.7*  HCT 32.5* 29.5* 31.5* 28.7*  28.5*  MCV 92.1 91.3 93.2 93.8 94.1  PLT 198 189 214 220 088   Basic Metabolic Panel: Recent Labs  Lab 07/28/19 0257 07/29/19 0252 07/30/19 0302 07/31/19 0305 08/01/19 0336 08/02/19 0249 08/03/19 0517  NA 134*   < > 130* 131* 133* 130* 131*  K 3.9   < > 3.3* 3.5 4.0 3.7 4.6  CL 102   < > 100 103 102 100 101  CO2 20*   < > 19* 18* 21* 19* 17*  GLUCOSE 121*   < > 121* 113* 96 110* 123*  BUN 100*   < > 93* 89* 99* 99* 90*  CREATININE 2.32*   < > 1.92* 1.48* 1.77* 1.66* 2.18*  CALCIUM 7.7*   < > 7.4* 7.4* 7.8* 7.8* 8.0*  MG  --   --   --  2.5*  --   --   --   PHOS 5.7*  --   --   --   --   --   --    < > = values in this interval not displayed.   GFR: Estimated Creatinine Clearance: 25.3 mL/min (A) (by C-G formula based on SCr of 2.18 mg/dL (H)). Liver Function Tests: Recent Labs  Lab 07/28/19 0257 07/29/19 0252 07/31/19 0305 08/01/19 0336 08/02/19 0249  AST 515* 510* 455* 414* 387*  ALT 231* 253* 246* 239* 232*  ALKPHOS 573* 644* 616* 627* 664*  BILITOT 13.7* 13.4* 11.1* 11.3* 11.3*  PROT 4.5* 4.8* 4.3* 4.6* 4.6*  ALBUMIN 1.6* 1.5* 1.3* 1.3* 1.3*   No results for input(s): LIPASE, AMYLASE in the last 168 hours. Recent Labs  Lab 08/01/19 0336  AMMONIA 40*   Coagulation Profile: Recent Labs  Lab 08/01/19 0336  INR 1.3*   Cardiac Enzymes: Recent Labs  Lab 07/31/19 0305 08/01/19 0336  CKTOTAL 1,487* 781*   BNP (last 3 results) No results for input(s): PROBNP in the last 8760 hours. HbA1C: No results for input(s): HGBA1C in the last 72 hours. CBG: No results for input(s): GLUCAP in the last 168 hours. Lipid Profile: No results for input(s): CHOL, HDL, LDLCALC, TRIG, CHOLHDL, LDLDIRECT in the last 72 hours. Thyroid Function Tests: No results for input(s): TSH, T4TOTAL, FREET4, T3FREE, THYROIDAB in the last 72 hours. Anemia Panel: No results for input(s): VITAMINB12, FOLATE, FERRITIN, TIBC, IRON, RETICCTPCT in the last 72 hours. Sepsis Labs: Recent  Labs  Lab 07/30/19 0302 07/30/19 0733 07/31/19 0305 08/01/19 0336  LATICACIDVEN 2.0* 2.4* 2.3* 1.9    Recent Results (from the past 240 hour(s))  Culture, Urine     Status: None   Collection Time: 07/30/19  2:47 PM   Specimen: Urine, Clean Catch  Result Value Ref Range Status   Specimen Description   Final    URINE, CLEAN CATCH Performed at [facility], [place] [address] [place], [place] [address]    Special Requests   Final    NONE Performed at [facility], [place] [address] [place], [place] [address]    Culture   Final    NO GROWTH Performed at [facility], [place] [address] [place], [place] [address]    Report Status 08/01/2019 FINAL  Final  Culture, blood (routine x 2)     Status: None (Preliminary result)   Collection Time: 07/30/19  3:09 PM   Specimen:  BLOOD  Result Value Ref Range Status   Specimen Description   Final    BLOOD LEFT ANTECUBITAL Performed at [place] [address] [place], [place] [address]    Special Requests   Final    BOTTLES DRAWN AEROBIC ONLY Blood Culture adequate volume Performed at [place] [address] [place], [place] [address]    Culture   Final    NO GROWTH 4 DAYS Performed at [facility], [place] [address] [place], [place] [address]    Report Status PENDING  Incomplete  Culture, blood (routine x 2)     Status: None (Preliminary result)   Collection Time: 07/30/19  3:09 PM   Specimen: BLOOD  Result Value Ref Range Status   Specimen Description   Final    BLOOD BLOOD LEFT HAND Performed at [place] [address] [place], [place] [address]    Special Requests   Final    BOTTLES DRAWN AEROBIC ONLY Blood Culture adequate volume   Culture   Final    NO GROWTH 4 DAYS Performed at [facility], [place] [address], [place], [place] [address]    Report Status PENDING  Incomplete      Radiology  Studies: ECHOCARDIOGRAM COMPLETE  Result Date: 08/03/2019    ECHOCARDIOGRAM REPORT   Patient Name:   INFINITI HOEFLING Date of Exam: 08/03/2019 Medical Rec #:  [id number]     Height:       65.0 in Accession #:    [id number]    Weight:       207.9 lb Date of Birth:  10-15-1945     BSA:          2.011 m Patient Age:    41 years      BP:           98/48 mmHg Patient Gender: F             HR:           82 bpm. Exam Location:  Inpatient Procedure: 2D Echo, Cardiac Doppler, Color Doppler and Strain Analysis Indications:    Z51.11 Encounter for antineoplastic chemotheraphy  History:        Patient has no prior history of Echocardiogram examinations.                 Signs/Symptoms:Syncope; Risk Factors:Hypertension, Dyslipidemia                 and GERD.  Sonographer:    Tiffany Dance Referring Phys: [id number] [place]  1. Left ventricular ejection fraction, by estimation, is 65 to 70%. The left ventricle has normal function. The left ventricle has no regional wall motion abnormalities. There is mild left ventricular hypertrophy. Left ventricular diastolic parameters are consistent with Grade I diastolic dysfunction (impaired relaxation).  2. Right ventricular systolic function is normal. The right ventricular size is normal. There is normal pulmonary artery systolic pressure.  3. The mitral valve is grossly normal. Trivial mitral valve regurgitation.  4. The aortic valve is tricuspid. Aortic valve regurgitation is not visualized.  5. The inferior vena cava is normal in size with greater than 50% respiratory variability, suggesting right atrial pressure of 3 mmHg. FINDINGS  Left Ventricle: Left ventricular ejection fraction, by estimation, is 65 to 70%. The left ventricle has normal function. The left ventricle has no regional wall motion abnormalities. Global longitudinal strain performed but not reported based on interpreter judgement  due to suboptimal tracking. The left ventricular internal cavity size was normal  in size. There is mild left ventricular hypertrophy. Left ventricular diastolic parameters are consistent with Grade I diastolic dysfunction (impaired relaxation). Indeterminate filling pressures. Right Ventricle: The right ventricular size is normal. No increase in right ventricular wall thickness. Right ventricular systolic function is normal. There is normal pulmonary artery systolic pressure. The tricuspid regurgitant velocity is 2.58 m/s, and  with an assumed right atrial pressure of 3 mmHg, the estimated right ventricular systolic pressure is 46.2 mmHg. Left Atrium: Left atrial size was normal in size. Right Atrium: Right atrial size was normal in size. Pericardium: There is no evidence of pericardial effusion. Mitral Valve: The mitral valve is grossly normal. Trivial mitral valve regurgitation. Tricuspid Valve: The tricuspid valve is grossly normal. Tricuspid valve regurgitation is mild. Aortic Valve: The aortic valve is tricuspid. Aortic valve regurgitation is not visualized. Pulmonic Valve: The pulmonic valve was grossly normal. Pulmonic valve regurgitation is not visualized. Aorta: The aortic root and ascending aorta are structurally normal, with no evidence of dilitation. Venous: The inferior vena cava is normal in size with greater than 50% respiratory variability, suggesting right atrial pressure of 3 mmHg. IAS/Shunts: No atrial level shunt detected by color flow Doppler.  LEFT VENTRICLE PLAX 2D LVIDd:         4.00 cm  Diastology LVIDs:         2.30 cm  LV e' lateral:   12.05 cm/s LV PW:         1.20 cm  LV E/e' lateral: 7.1 LV IVS:        1.00 cm  LV e' medial:    8.75 cm/s LVOT diam:     2.10 cm  LV E/e' medial:  9.8 LV SV:         74 LV SV Index:   37 LVOT Area:     3.46 cm  RIGHT VENTRICLE             IVC RV Basal diam:  2.80 cm     IVC diam: 1.60 cm RV S prime:     18.00 cm/s TAPSE (M-mode): 2.2 cm LEFT ATRIUM             Index       RIGHT ATRIUM           Index LA diam:        4.30 cm 2.14 cm/m   RA Area:     12.00 cm LA Vol (A2C):   56.0 ml 27.85 ml/m RA Volume:   25.70 ml  12.78 ml/m LA Vol (A4C):   30.5 ml 15.17 ml/m LA Biplane Vol: 43.6 ml 21.68 ml/m  AORTIC VALVE LVOT Vmax:   108.00 cm/s LVOT Vmean:  68.600 cm/s LVOT VTI:    0.214 m  AORTA Ao Root diam: 3.20 cm Ao Asc diam:  2.80 cm MITRAL VALVE               TRICUSPID VALVE MV Area (PHT): 4.68 cm    TR Peak grad:   26.6 mmHg MV Decel Time: 162 msec    TR Vmax:        258.00 cm/s MV E velocity: 85.70 cm/s MV A velocity: 63.60 cm/s  SHUNTS MV E/A ratio:  1.35        Systemic VTI:  0.21 m  Systemic Diam: 2.10 cm Lyman Bishop MD Electronically signed by Lyman Bishop MD Signature Date/Time: 08/03/2019/11:49:39 AM    Final         Scheduled Meds: . Chlorhexidine Gluconate Cloth  6 each Topical Daily  . [START ON 08/04/2019] enoxaparin (LOVENOX) injection  30 mg Subcutaneous Daily  . famotidine  20 mg Oral Daily  . feeding supplement  1 Container Oral BID BM  . senna-docusate  1 tablet Oral BID  . sodium bicarbonate  650 mg Oral TID  . tamsulosin  0.4 mg Oral Daily   Continuous Infusions:   Assessment and plan:   1. 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 Received Zosyn for total of 5 days post procedure.Liver enzymes and bilirubins (ALK phos1064-->600s, AST 1193-->380, ALT 462-->230, Total Bilirubin 20-->11) slowly downtrending.  Albumin at 1.8 and INR 1.6. Now tolerating regular diet. Per GI , patient will need repeat labs/KUB in 2 weeks for pancreatic stent follow-up and repeat ERCP in 3 months ([place] will arrange).  2. AKI with Non anionic gap metabolic acidosis. AKI could be related to initial hypotension/ATN versus prerenal in  the setting of dehydration/diuretic use.  Home medications HCTZ and Telmisartan been on hold.  She did have urinary retention 5/9 and Foley catheter placed.  However noted to have continued worseningof renal function with BUN up to 90s and serum Cr up to2.3 with acidosis prompting nephrology consultation--patient started on bicarb supplements..ACEI/ARB/diuretics held, contrasted studies avoided and patient received intermittent IV hydration with which her renal function did improve with creatinine downtrending to 1.4.-->up at 2.1 today. Uremia however has persisted and could be contributing to overall weakness.    3.Bilateral lower extremity weakness: MRI thoracic and lumbar spine negative for spinal mets or significant findings to suggest canal stenosis.  She did have urinary incontinence last week but now off Foley and able to control bladder.  Has external Foley in place due to limited ambulation.  Lower extremity weakness more or less has persisted over the last week.  Noted that patient was evaluated by neurology, Dr. Leonel Ramsay who felt presentation likely due to neuropraxia versus myopathy.  CK was elevated at 1487->improved to 780 on repeat.  Husband d/w neurology yesterday. Neurology recommends intensive physical therapy.  EMG/nerve conduction studies as outpatient possibly.  PT recommends CIR. Not sure if there could be a component of somatization--requested nurse to monitor   4. Hx of Parotid/Breast cancer, nowwith diffuse liver metastasis.Seen by Dr. Burr Medico from oncologywho is obtained baseline echo and plans on inpatient treatment with "TDM 1 ([place]la), which is a target therapy of drug conjugate of trastuzumab and chemo emtansine, more tolerable with less side effects".. She has discontinued anastrazole. 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 Lanell Persons referred her for a guided biopsy of the liver lesions after CT chest for interval screening of parotid cancer resulted  abnormal.  5. Hyponatremia/Hypokalemia:  Etiology likely secondary to hypovolemic hyponatremia in the setting of poor oral intake and decreased appetite over the past week/month. Sodium 129 and potassium 2.6  on admission-improved with IVF and electrolyte replacement respectively.  Sodium however appears to be fluctuating and low again on labs today.  Patient off IV fluids and appears somewhat hypervolemic with leg edema.  Will hold off on  diuretics in concern for problem #2.  6. HTN/ dyslipidemia. Home medsTelemisartan and hctz/ statin therapy on hold since admission.Blood pressure improved since admission, low normal without meds.  7. GERD.Continue with famotidine  8. Moderate malnutrition: in the setting of advanced malignancy. Continue protein supplements, Boost   DVT prophylaxis: Lovenox Code Status: Full code Family / Patient Communication: Discussed with patient and husband at bedside Disposition Plan:   Status is: Inpatient  Remains inpatient appropriate because:Inpatient level of care appropriate due to severity of illness, persistent lower extremity weakness, pending alternate level of care availability (CIR)   Dispo: The patient is from: Home              Anticipated d/c is to: CIR -accepted by inpatient rehab but insurance has apparently denied and recommended SNF instead. Provided CM my phone number for Peer to peer . Patient will likely need frequent MD visits for labs/neuropsych evaluation               Anticipated d/c date is: 2 days              Patient currently medically stable to d/c--awaiting appropriate alternate disposition bed.       Time spent: 35 minutes.  Discussed with husband, discussed with Oncology    Guilford Shi, MD [facility] Pager in [place]  If 7PM-7AM, please contact night-coverage [email or web address] 08/03/2019, 2:58 PM

## 2019-08-03 NOTE — Progress Notes (Signed)
Nutrition Follow-up  INTERVENTION:   -Boost Breeze po BID, each supplement provides 250 kcal and 9 grams of protein  NUTRITION DIAGNOSIS:   Increased nutrient needs related to acute illness as evidenced by estimated needs.  Ongoing. Related to metastatic breast cancer.  GOAL:   Patient will meet greater than or equal to 90% of their needs  Progressing.  MONITOR:   PO intake, Supplement acceptance, Labs, Weight trends  REASON FOR ASSESSMENT:   Malnutrition Screening Tool    ASSESSMENT:   74 year old female with past medical history of HTN, HLD, history of breast/parotid cancer, GERD, admitted for obstructive juandice after presenting from IR (where she was scheduled for guided biopsy of liver lesion found on 4/19 CT chest during parotid cancer screening) with concerns of electrolyte disturbance and reports skin discoloration onset 7 days ago associated with some N/V, decreased appetite, dizziness, weakness, and fatigue that has been progressing over the last month.  Patient currently consuming 75-100% of meals, with improved appetite.  Per oncology note, recommend pt begin treatment for metastatic breast cancer.   Pt is drinking Boost Breeze supplements.  Admission weight: 170 lbs. Current weight: 207 lbs.  I/Os: +8.4L since admit UOP: 300 ml x 24 hrs  Medications: Senokot Labs reviewed: Low Na  Diet Order:   Diet Order            Diet Heart Room service appropriate? Yes; Fluid consistency: Thin  Diet effective now              EDUCATION NEEDS:   No education needs have been identified at this time  Skin:  Skin Assessment: Reviewed RN Assessment(juandice)  Last BM:  5/17- type 4  Height:   Ht Readings from Last 1 Encounters:  07/23/19 5\' 5"  (1.651 m)    Weight:   Wt Readings from Last 1 Encounters:  08/03/19 94.3 kg   BMI:  Body mass index is 34.6 kg/m.  Estimated Nutritional Needs:   Kcal:  2000-2200  Protein:  100-110  Fluid:  > 1.9  L/day  Clayton Bibles, MS, RD, LDN Inpatient Clinical Dietitian Contact information available via Amion

## 2019-08-03 NOTE — Progress Notes (Signed)
Occupational Therapy Treatment Patient Details Name: [name] MRN: [id number] DOB: 09-09-1945 Today's Date: 08/03/2019    History of present illness 74 yo female admitted with diffuse liver mets, obstructive jaundice with high risk for cholangitis, AKI. S/P ERCP with biliary stent placement 5/7. New onset of weakness, proximal LE weakness > distal LE weakness. Hx of met breast cancer.   OT comments  Pt did well this day and needed less A!  Pt more participative with OT and willing to get to chair and stay up for a while!  Follow Up Recommendations  CIR    Equipment Recommendations  Other (comment)(defer to next venue)    Recommendations for Other Services      Precautions / Restrictions Precautions Precautions: Fall Precaution Comments: anxiety/fear of falling/fear of being dropped/enormous swelling throughout ABD and lower body Restrictions Weight Bearing Restrictions: No Other Position/Activity Restrictions: WBAT       Mobility Bed Mobility Overal bed mobility: Needs Assistance Bed Mobility: Supine to Sit Rolling: Min assist            Transfers Overall transfer level: Needs assistance Equipment used: Rolling walker (2 wheeled) Transfers: Sit to/from [facility] Sit to Stand: Mod assist;From elevated surface Stand pivot transfers: Mod assist;+2 safety/equipment;From elevated surface       General transfer comment: walker    Balance Overall balance assessment: Needs assistance Sitting-balance support: No upper extremity supported;Feet supported Sitting balance-Leahy Scale: Fair     Standing balance support: Bilateral upper extremity supported Standing balance-Leahy Scale: Poor                             ADL either performed or assessed with clinical judgement   ADL Overall ADL's : Needs assistance/impaired                         Toilet Transfer: Moderate assistance;RW;Stand-pivot;Cueing for safety;Cueing  for sequencing;+2 for safety/equipment   Toileting- Clothing Manipulation and Hygiene: Maximal assistance;Sit to/from stand;Cueing for sequencing;Cueing for safety;Cueing for compensatory techniques;+2 for safety/equipment       Functional mobility during ADLs: Moderate assistance;+2 for physical assistance;Cueing for sequencing;Rolling walker;Cueing for safety General ADL Comments: pt with increased I this day!  Pt able to step from bed to chair taking small steps with walker. 2 person A for safety     Vision       Perception     Praxis      Cognition Arousal/Alertness: Awake/alert Behavior During Therapy: Anxious;WFL for tasks assessed/performed Overall Cognitive Status: Within Functional Limits for tasks assessed                                                     Pertinent Vitals/ Pain       Pain Assessment: No/denies pain         Frequency  Min 2X/week        Progress Toward Goals  OT Goals(current goals can now be found in the care plan section)  Progress towards OT goals: Progressing toward goals     Plan Discharge plan remains appropriate       AM-PAC OT "6 Clicks" Daily Activity     Outcome Measure   Help from another person eating meals?: None Help from another person taking  care of personal grooming?: A Little Help from another person toileting, which includes using toliet, bedpan, or urinal?: A Lot Help from another person bathing (including washing, rinsing, drying)?: A Lot Help from another person to put on and taking off regular upper body clothing?: A Little Help from another person to put on and taking off regular lower body clothing?: A Lot 6 Click Score: 16    End of Session Equipment Utilized During Treatment: Rolling walker;Gait belt  OT Visit Diagnosis: Other abnormalities of gait and mobility (R26.89);Muscle weakness (generalized) (M62.81);Repeated falls (R29.6)   Activity Tolerance Patient tolerated treatment  well   Patient Left in chair;with call bell/phone within reach;with chair alarm set   Nurse Communication Need for lift equipment        Time: 1311-1343 OT Time Calculation (min): 32 min  Charges: OT General Charges $OT Visit: 1 Visit OT Treatments $Self Care/Home Management : 23-37 mins  Kari Baars, [place] [phone] Office- [place], Edwena Felty D 08/03/2019, 2:14 PM

## 2019-08-04 LAB — COMPREHENSIVE METABOLIC PANEL
ALT: 183 U/L — ABNORMAL HIGH (ref 0–44)
AST: 246 U/L — ABNORMAL HIGH (ref 15–41)
Albumin: 1.3 g/dL — ABNORMAL LOW (ref 3.5–5.0)
Alkaline Phosphatase: 580 U/L — ABNORMAL HIGH (ref 38–126)
Anion gap: 12 (ref 5–15)
BUN: 78 mg/dL — ABNORMAL HIGH (ref 8–23)
CO2: 20 mmol/L — ABNORMAL LOW (ref 22–32)
Calcium: 7.8 mg/dL — ABNORMAL LOW (ref 8.9–10.3)
Chloride: 100 mmol/L (ref 98–111)
Creatinine, Ser: 2.01 mg/dL — ABNORMAL HIGH (ref 0.44–1.00)
GFR calc Af Amer: 27 mL/min — ABNORMAL LOW (ref >60)
GFR calc non Af Amer: 24 mL/min — ABNORMAL LOW (ref >60)
Glucose, Bld: 88 mg/dL (ref 70–99)
Potassium: 4 mmol/L (ref 3.5–5.1)
Sodium: 132 mmol/L — ABNORMAL LOW (ref 135–145)
Total Bilirubin: 10.7 mg/dL — ABNORMAL HIGH (ref 0.3–1.2)
Total Protein: 4.7 g/dL — ABNORMAL LOW (ref 6.5–8.1)

## 2019-08-04 LAB — CBC
HCT: 28.2 % — ABNORMAL LOW (ref 36.0–46.0)
Hemoglobin: 9.7 g/dL — ABNORMAL LOW (ref 12.0–15.0)
MCH: 32.6 pg (ref 26.0–34.0)
MCHC: 34.4 g/dL (ref 30.0–36.0)
MCV: 94.6 fL (ref 80.0–100.0)
Platelets: 248 10*3/uL (ref 150–400)
RBC: 2.98 MIL/uL — ABNORMAL LOW (ref 3.87–5.11)
RDW: 21.6 % — ABNORMAL HIGH (ref 11.5–15.5)
WBC: 14.5 10*3/uL — ABNORMAL HIGH (ref 4.0–10.5)
nRBC: 0 % (ref 0.0–0.2)

## 2019-08-04 LAB — MRSA PCR SCREENING: MRSA by PCR: NEGATIVE

## 2019-08-04 LAB — CORTISOL: Cortisol, Plasma: 20.9 ug/dL

## 2019-08-04 LAB — CULTURE, BLOOD (ROUTINE X 2)
Culture: NO GROWTH
Culture: NO GROWTH
Special Requests: ADEQUATE
Special Requests: ADEQUATE

## 2019-08-04 MED ORDER — SODIUM CHLORIDE 0.9 % IV SOLN
260.0000 mg | Freq: Once | INTRAVENOUS | Status: AC
  Administered 2019-08-04: 260 mg via INTRAVENOUS
  Filled 2019-08-04: qty 8

## 2019-08-04 MED ORDER — MIDODRINE HCL 2.5 MG PO TABS: 2.5000 mg | ORAL_TABLET | Freq: Once | ORAL | Status: DC

## 2019-08-04 MED ORDER — SODIUM CHLORIDE 0.9 % IV BOLUS
500.0000 mL | Freq: Once | INTRAVENOUS | Status: AC
  Administered 2019-08-04: 500 mL via INTRAVENOUS

## 2019-08-04 MED ORDER — SODIUM CHLORIDE 0.9 % IV SOLN: INTRAVENOUS | Status: DC

## 2019-08-04 MED ORDER — SODIUM CHLORIDE 0.9 % IV SOLN: Freq: Once | INTRAVENOUS | Status: AC

## 2019-08-04 MED ORDER — ENOXAPARIN SODIUM 40 MG/0.4ML ~~LOC~~ SOLN
40.0000 mg | Freq: Every day | SUBCUTANEOUS | Status: DC
  Administered 2019-08-04 – 2019-08-06 (×3): 40 mg via SUBCUTANEOUS
  Filled 2019-08-04 (×3): qty 0.4

## 2019-08-04 MED ORDER — ALBUMIN HUMAN 5 % IV SOLN
25.0000 g | Freq: Once | INTRAVENOUS | Status: AC
  Administered 2019-08-04: 25 g via INTRAVENOUS

## 2019-08-04 MED ORDER — MIDODRINE HCL 2.5 MG PO TABS
2.5000 mg | ORAL_TABLET | Freq: Three times a day (TID) | ORAL | Status: DC
  Administered 2019-08-04 – 2019-08-06 (×7): 2.5 mg via ORAL
  Filled 2019-08-04 (×8): qty 1

## 2019-08-04 MED ORDER — ALBUMIN HUMAN 5 % IV SOLN
25.0000 g | Freq: Once | INTRAVENOUS | Status: AC
  Administered 2019-08-04: 25 g via INTRAVENOUS
  Filled 2019-08-04: qty 500

## 2019-08-04 MED ORDER — DIPHENHYDRAMINE HCL 25 MG PO CAPS
25.0000 mg | ORAL_CAPSULE | Freq: Once | ORAL | Status: AC
  Administered 2019-08-04: 25 mg via ORAL
  Filled 2019-08-04: qty 1

## 2019-08-04 MED ORDER — HYDROCORTISONE NA SUCCINATE PF 100 MG IJ SOLR
50.0000 mg | Freq: Three times a day (TID) | INTRAMUSCULAR | Status: DC
  Administered 2019-08-04 – 2019-08-06 (×6): 50 mg via INTRAVENOUS
  Filled 2019-08-04 (×6): qty 2

## 2019-08-04 MED ORDER — ACETAMINOPHEN 325 MG PO TABS
650.0000 mg | ORAL_TABLET | Freq: Once | ORAL | Status: AC
  Administered 2019-08-04: 650 mg via ORAL
  Filled 2019-08-04: qty 2

## 2019-08-04 NOTE — Progress Notes (Addendum)
PROGRESS NOTE    [name]  [id number]  DOB: 05/24/1945  PCP: [name], MD 74 year old female with history for hypertension, dyslipidemia, breast/parotid cancer and GERD who was recently noted to have new liver lesions on surveillance CT chest presented with hypotension after a ultrasound-guided liver biopsy by IR on the day of admission.Patient reported about 7 days of skin discoloration, associated with nausea, vomiting, decreased appetite and generalized weakness. Her preoperative studies showed hyponatremia, hypokalemia, hyperbilirubinemia and elevated liver enzymes.  ED Course: Afebrile. After ultrasound-guided biopsy,her blood pressure was 66/36, with IV fluids improved to 101/55, heart rate 73, respiratory rate 16, temperature 97.9, oxygen saturation 99%.WBC count 8.8, hemoglobin 15.1, platelet 207. Sodium 129, potassium 2.6, chloride 87, CO2 28, BUN 35, creatinine 1.31, glucose 135. Total bilirubin 22.5, AST 1193, ALT 462. Patient was given 2 L NS bolus, Benadryl.  Hospital course: Patient admitted to [facility] for further evaluation and management,placed on intravenous fluids, close hemodynamic monitoring and [facility] GI consult placed. MRCP 5/4 with widespread metastatic disease to the liver. Dominant index lesion in the right hepatic lobe 4.0 x 3.0 cm. Intrahepatic biliary dilatation with central truncation of the biliary tree.Liver biopsy positive for metastatic breast cancer. ERCP on 5/7 revealed severe biliary stricturing, at the middle and upper third of the main bile duct, dilation of the right and left intrahepatic ducts. Now s/p biliary sphincterectomy/ sphincterectoplasty. Plastic biliary stents were placed at the right, left hepatic ducts and at the ventral pancreatic duct.Patient will need radiographic follow up as outpatient.Patient very weak and deconditioned, PT/OT recommendation for CIR.MRI of T and L spine did not demonstrate a cause for weakness.    Subjective:  Patient appears tired but in no acute distress when seen in rounds.  Jaundiced.  Noted that blood pressure in systolic 0000000 earlier in the day but dropped to 80s this afternoon.  She reports improvement in leg strength, able to raise above gravity although still weak, husband not at bedside today  Objective: Vitals:   08/04/19 1548 08/04/19 1602 08/04/19 1604 08/04/19 1607  BP: (!) 79/46 (!) 99/47 (!) 84/45 (!) 89/45  Pulse: 78 84 83 81  Resp:    20  Temp:    98 F (36.7 C)  TempSrc:    Oral  SpO2: 95% 97% 95% 93%  Weight:      Height:        Intake/Output Summary (Last 24 hours) at 08/04/2019 1622 Last data filed at 08/04/2019 1500 Gross per 24 hour  Intake 20.75 ml  Output 900 ml  Net -879.25 ml   Filed Weights   07/31/19 0434 08/03/19 0441 08/04/19 1300  Weight: 94.4 kg 94.3 kg 94.3 kg    Physical Examination:  General exam: Appears concerned, but in no acute distress.  Improving icterus Respiratory system: Clear to auscultation. Respiratory effort normal. Cardiovascular system: S1 & S2 heard, RRR. Marland Kitchen  2+ pitting pedal edema. Gastrointestinal system: Abdomen is nondistended, soft and nontender. Normal bowel sounds heard. Central nervous system: Alert and oriented. No new focal neurological deficits. Extremities: Persistent lower extremity weakness with strength 3/5 in RLE and LLE, able to wiggle toes and sensations to crude touch intact, 2+ pitting edema  Skin: No rashes, lesions or ulcers but appears jaundiced Psychiatry: Judgement and insight appear normal. Mood & affect somewhat flat.   Data Reviewed: I have personally reviewed following labs and imaging studies  CBC: Recent Labs  Lab 07/29/19 1005 07/29/19 1005 07/30/19 0302 07/30/19 1509 08/01/19 0336 08/02/19 0249 08/04/19  0849  WBC 17.6*   < > 18.4* 20.7* 17.8* 17.1* 14.5*  NEUTROABS 15.2*  --  16.1* 18.6* 15.9* 15.2*  --   HGB 11.1*   < > 10.3* 10.8* 9.8* 9.7* 9.7*  HCT 32.5*   < > 29.5*  31.5* 28.7* 28.5* 28.2*  MCV 92.1   < > 91.3 93.2 93.8 94.1 94.6  PLT 198   < > 189 214 220 219 248   < > = values in this interval not displayed.   Basic Metabolic Panel: Recent Labs  Lab 07/31/19 0305 08/01/19 0336 08/02/19 0249 08/03/19 0517 08/04/19 0849  NA 131* 133* 130* 131* 132*  K 3.5 4.0 3.7 4.6 4.0  CL 103 102 100 101 100  CO2 18* 21* 19* 17* 20*  GLUCOSE 113* 96 110* 123* 88  BUN 89* 99* 99* 90* 78*  CREATININE 1.48* 1.77* 1.66* 2.18* 2.01*  CALCIUM 7.4* 7.8* 7.8* 8.0* 7.8*  MG 2.5*  --   --   --   --    GFR: Estimated Creatinine Clearance: 27.4 mL/min (A) (by C-G formula based on SCr of 2.01 mg/dL (H)). Liver Function Tests: Recent Labs  Lab 07/29/19 0252 07/31/19 0305 08/01/19 0336 08/02/19 0249 08/04/19 0849  AST 510* 455* 414* 387* 246*  ALT 253* 246* 239* 232* 183*  ALKPHOS 644* 616* 627* 664* 580*  BILITOT 13.4* 11.1* 11.3* 11.3* 10.7*  PROT 4.8* 4.3* 4.6* 4.6* 4.7*  ALBUMIN 1.5* 1.3* 1.3* 1.3* 1.3*   No results for input(s): LIPASE, AMYLASE in the last 168 hours. Recent Labs  Lab 08/01/19 0336  AMMONIA 40*   Coagulation Profile: Recent Labs  Lab 08/01/19 0336  INR 1.3*   Cardiac Enzymes: Recent Labs  Lab 07/31/19 0305 08/01/19 0336  CKTOTAL 1,487* 781*   BNP (last 3 results) No results for input(s): PROBNP in the last 8760 hours. HbA1C: No results for input(s): HGBA1C in the last 72 hours. CBG: No results for input(s): GLUCAP in the last 168 hours. Lipid Profile: No results for input(s): CHOL, HDL, LDLCALC, TRIG, CHOLHDL, LDLDIRECT in the last 72 hours. Thyroid Function Tests: No results for input(s): TSH, T4TOTAL, FREET4, T3FREE, THYROIDAB in the last 72 hours. Anemia Panel: Recent Labs    08/03/19 1534  VITAMINB12 4,286*  FOLATE 9.0   Sepsis Labs: Recent Labs  Lab 07/30/19 0302 07/30/19 0733 07/31/19 0305 08/01/19 0336  LATICACIDVEN 2.0* 2.4* 2.3* 1.9    Recent Results (from the past 240 hour(s))  Culture,  Urine     Status: None   Collection Time: 07/30/19  2:47 PM   Specimen: Urine, Clean Catch  Result Value Ref Range Status   Specimen Description   Final    URINE, CLEAN CATCH Performed at [facility], [place] [address] [place], [place] [address]    Special Requests   Final    NONE Performed at [facility], [place] [address], [place], [place] [address]    Culture   Final    NO GROWTH Performed at [facility], [place] [address] [place], [place] [address]    Report Status 08/01/2019 FINAL  Final  Culture, blood (routine x 2)     Status: None   Collection Time: 07/30/19  3:09 PM   Specimen: BLOOD  Result Value Ref Range Status   Specimen Description   Final    BLOOD LEFT ANTECUBITAL Performed at [place] [address] [place], [place] [address]    Special Requests  Final    BOTTLES DRAWN AEROBIC ONLY Blood Culture adequate volume Performed at [place] [address], [place], [place] [address]    Culture   Final    NO GROWTH 5 DAYS Performed at [facility], [place] [address], [place], [place] [address]    Report Status 08/04/2019 FINAL  Final  Culture, blood (routine x 2)     Status: None   Collection Time: 07/30/19  3:09 PM   Specimen: BLOOD  Result Value Ref Range Status   Specimen Description   Final    BLOOD BLOOD LEFT HAND Performed at [place] [address], [place], [place] [address]    Special Requests   Final    BOTTLES DRAWN AEROBIC ONLY Blood Culture adequate volume   Culture   Final    NO GROWTH 5 DAYS Performed at [facility], [place] [address] [place], [place] [address]    Report Status 08/04/2019 FINAL  Final      Radiology Studies: ECHOCARDIOGRAM COMPLETE  Result Date: 08/03/2019    ECHOCARDIOGRAM REPORT   Patient Name:   VIANCA GARDENHIRE Date of Exam: 08/03/2019 Medical Rec #:  [id number]     Height:       65.0 in Accession #:     [id number]    Weight:       207.9 lb Date of Birth:  Sep 29, 1945     BSA:          2.011 m Patient Age:    89 years      BP:           98/48 mmHg Patient Gender: F             HR:           82 bpm. Exam Location:  Inpatient Procedure: 2D Echo, Cardiac Doppler, Color Doppler and Strain Analysis Indications:    Z51.11 Encounter for antineoplastic chemotheraphy  History:        Patient has no prior history of Echocardiogram examinations.                 Signs/Symptoms:Syncope; Risk Factors:Hypertension, Dyslipidemia                 and GERD.  Sonographer:    Tiffany Dance Referring Phys: [id number] Baird  1. Left ventricular ejection fraction, by estimation, is 65 to 70%. The left ventricle has normal function. The left ventricle has no regional wall motion abnormalities. There is mild left ventricular hypertrophy. Left ventricular diastolic parameters are consistent with Grade I diastolic dysfunction (impaired relaxation).  2. Right ventricular systolic function is normal. The right ventricular size is normal. There is normal pulmonary artery systolic pressure.  3. The mitral valve is grossly normal. Trivial mitral valve regurgitation.  4. The aortic valve is tricuspid. Aortic valve regurgitation is not visualized.  5. The inferior vena cava is normal in size with greater than 50% respiratory variability, suggesting right atrial pressure of 3 mmHg. FINDINGS  Left Ventricle: Left ventricular ejection fraction, by estimation, is 65 to 70%. The left ventricle has normal function. The left ventricle has no regional wall motion abnormalities. Global longitudinal strain performed but not reported based on interpreter judgement due to suboptimal tracking. The left ventricular internal cavity size was normal in size. There is mild left ventricular hypertrophy. Left ventricular diastolic parameters are consistent with Grade I diastolic dysfunction (impaired relaxation). Indeterminate filling pressures. Right  Ventricle: The  right ventricular size is normal. No increase in right ventricular wall thickness. Right ventricular systolic function is normal. There is normal pulmonary artery systolic pressure. The tricuspid regurgitant velocity is 2.58 m/s, and  with an assumed right atrial pressure of 3 mmHg, the estimated right ventricular systolic pressure is 0000000 mmHg. Left Atrium: Left atrial size was normal in size. Right Atrium: Right atrial size was normal in size. Pericardium: There is no evidence of pericardial effusion. Mitral Valve: The mitral valve is grossly normal. Trivial mitral valve regurgitation. Tricuspid Valve: The tricuspid valve is grossly normal. Tricuspid valve regurgitation is mild. Aortic Valve: The aortic valve is tricuspid. Aortic valve regurgitation is not visualized. Pulmonic Valve: The pulmonic valve was grossly normal. Pulmonic valve regurgitation is not visualized. Aorta: The aortic root and ascending aorta are structurally normal, with no evidence of dilitation. Venous: The inferior vena cava is normal in size with greater than 50% respiratory variability, suggesting right atrial pressure of 3 mmHg. IAS/Shunts: No atrial level shunt detected by color flow Doppler.  LEFT VENTRICLE PLAX 2D LVIDd:         4.00 cm  Diastology LVIDs:         2.30 cm  LV e' lateral:   12.05 cm/s LV PW:         1.20 cm  LV E/e' lateral: 7.1 LV IVS:        1.00 cm  LV e' medial:    8.75 cm/s LVOT diam:     2.10 cm  LV E/e' medial:  9.8 LV SV:         74 LV SV Index:   37 LVOT Area:     3.46 cm  RIGHT VENTRICLE             IVC RV Basal diam:  2.80 cm     IVC diam: 1.60 cm RV S prime:     18.00 cm/s TAPSE (M-mode): 2.2 cm LEFT ATRIUM             Index       RIGHT ATRIUM           Index LA diam:        4.30 cm 2.14 cm/m  RA Area:     12.00 cm LA Vol (A2C):   56.0 ml 27.85 ml/m RA Volume:   25.70 ml  12.78 ml/m LA Vol (A4C):   30.5 ml 15.17 ml/m LA Biplane Vol: 43.6 ml 21.68 ml/m  AORTIC VALVE LVOT Vmax:   108.00  cm/s LVOT Vmean:  68.600 cm/s LVOT VTI:    0.214 m  AORTA Ao Root diam: 3.20 cm Ao Asc diam:  2.80 cm MITRAL VALVE               TRICUSPID VALVE MV Area (PHT): 4.68 cm    TR Peak grad:   26.6 mmHg MV Decel Time: 162 msec    TR Vmax:        258.00 cm/s MV E velocity: 85.70 cm/s MV A velocity: 63.60 cm/s  SHUNTS MV E/A ratio:  1.35        Systemic VTI:  0.21 m                            Systemic Diam: 2.10 cm Lyman Bishop MD Electronically signed by Lyman Bishop MD Signature Date/Time: 08/03/2019/11:49:39 AM    Final         Scheduled Meds:  ado-trastuzumab emtansine (  KADCYLA) CHEMO IV infusion  260 mg Intravenous Once   Chlorhexidine Gluconate Cloth  6 each Topical Daily   enoxaparin (LOVENOX) injection  40 mg Subcutaneous Daily   famotidine  20 mg Oral Daily   feeding supplement  1 Container Oral BID BM   midodrine  2.5 mg Oral TID WC   senna-docusate  1 tablet Oral BID   sodium bicarbonate  650 mg Oral TID   Continuous Infusions:  albumin human       Assessment and plan:  1.  Hypotension: Initially improved with IV hydration.  She also received intermittent IV fluids during hospitalization for AKI but has had worsening leg edema in the setting of hypoalbuminemia/advanced malignancy.  She has not been on diuretics or IV fluids in the last couple of days per renal recommendations.  Home diuretics/ACE inhibitors have been held in concern for AKI.  She did undergo echocardiogram on 5/18 which shows preserved EF.  She has recurrent hypotension today with SBP dropping to 80s.  Her oral intake has been poor.  Oncology was contemplating chemo today -discussed with Dr. Annamaria Boots who would prefer patient to get small IV fluid bolus to improve BP followed by chemo.  Will check cortisol level as cause for hypotension.  Understanding the risk of worsening leg edema, will give small IVF bolus 250 ml and albumin infusion.  Will add midodrine as well.  Oncology to discuss with husband regarding risks  and benefits of proceeding with chemo.  Consulted palliative care for further care goal discussion.  In the interim will transfer to stepdown unit for closer monitoring.  Will consider adding stress dose steroids after cortisol level drawn. Consulted PCCM for assistance with central line/hypotension Mx.  2. AKI with Non anionic gap metabolic acidosis. AKI could be related to initial hypotension/ATN versus prerenal in the setting of dehydration/diuretic use.  Home medications HCTZ and Telmisartan been on hold.  She did have urinary retention 5/9 and Foley catheter placed.  However noted to have continued worseningof renal function with BUN up to 90s and serum Cr up to2.3 with acidosis prompting nephrology consultation--patient started on bicarb supplements..ACEI/ARB/diuretics held, contrasted studies avoided and patient received intermittent IV hydration with which her renal function did improve with creatinine downtrending to 1.4.-->but up at 2.0 again. Uremia has persisted and could be contributing to overall weakness.    Repeat labs after IV albumin/hydration as discussed above.  3. 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 Received Zosyn for total of 5 days post procedure.Liver enzymes and bilirubins slowly downtrending.  Albumin at 1.8 and INR 1.6. Now tolerating regular diet.  GI last week recommended repeat labs/KUB in 2 weeks for pancreatic stent follow-up and repeat ERCP in 3 months ([facility] will arrange).   4. Bilateral lower extremity weakness: MRI thoracic and lumbar spine negative for spinal mets or significant findings to suggest canal stenosis.  She did have urinary incontinence last week but now off Foley and able to  control bladder.  Has external Foley in place due to limited ambulation.  Lower extremity weakness more or less has persisted over the last week.  Noted that patient was evaluated by neurology, Dr. Leonel Ramsay who felt presentation likely due to neuropraxia versus myopathy.  CK was elevated at 1487->improved to  780 on repeat.  Husband d/w neurology yesterday. Neurology recommends intensive physical therapy.  EMG/nerve conduction studies as outpatient possibly.  PT recommends CIR. Not sure if there could be a component of somatization--requested nurse to monitor   4. Hx of Parotid/Breast cancer, nowwith diffuse liver metastasis.Seen by Dr. Burr Medico from oncologywho is obtained baseline echo and plans on inpatient treatment with "TDM 1 (Kadcyla), which is a target therapy of drug conjugate of trastuzumab and chemo emtansine, more tolerable with less side effects".. She has discontinued anastrazole. 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 Lanell Persons referred her for a guided biopsy of the liver lesions after CT chest for interval screening of parotid cancer resulted abnormal.Chemo per oncology as discussed above  5. Hyponatremia/Hypokalemia:  Etiology likely secondary to hypovolemic hyponatremia in the setting of poor oral intake and decreased appetite over the past week/month. Sodium 129 and potassium 2.6  on admission-improved with IVF and electrolyte replacement respectively.  Sodium however appears to be fluctuating and low again on labs today.  Patient off IV fluids and appears somewhat hypervolemic with leg edema.  Will hold off on diuretics in concern for problem #2.  Cortisol level to rule out adrenal insufficiency.  6. HTN/ dyslipidemia. Home medsTelemisartan and hctz/ statin therapy on hold since admission.Blood pressure improved since admission, low normal without meds.  7. GERD.Continue with famotidine  8. Severe malnutrition, failure to thrive: in the setting of  advanced malignancy. Continue protein supplements, Boost.  Palliative care consulted for discussion of care goals given patient's progressive decline over the last 2 weeks.   DVT prophylaxis: Lovenox Code Status: Full code Family / Patient Communication: Discussed with patient and oncologist.  Husband not at bedside-called and left a message.  Will try calling him again.  Palliative care consulted Disposition Plan:   Status is: Inpatient  Remains inpatient appropriate because:Inpatient level of care appropriate due to severity of illness, persistent lower extremity weakness, pending alternate level of care availability (CIR)   Dispo: The patient is from: Home              Anticipated d/c is to: SNF rehab-insurance denied CIR and recommended SNF instead.               Anticipated d/c date is: 3-4 days              Patient currently medically stable to d/c--awaiting appropriate alternate disposition bed.       Time spent: 35 minutes.  Discussed with husband, discussed with Oncology    Guilford Shi, MD [facility] Pager in [place]  If 7PM-7AM, please contact night-coverage [email or web address] 08/04/2019, 4:22 PM

## 2019-08-04 NOTE — Significant Event (Signed)
PCCM/oncology input reviewed.  Met with husband at bedside later this evening.  He appeared tearful and was in conversation with patient when I enter the room.  He stated they have made the decision for DNR after talking to Dr. Feng earlier.  Patient apparently had transient improvement in blood pressure to systolic 90s with IV fluids/albumin but did not tolerate chemotherapy attempt.  Blood pressure 86/37 when I was in the room but patient appeared asymptomatic.  Husband also reiterated that he would like to avoid central lines and aggressive interventions if possible.  They were comfortable with me placing DNR order but would still like to consider pressors/central line placement if really needed tonight.  Updated primary nurse regarding my conversation at bedside and encouraged to discuss with husband before intervening if blood pressure were to drop again tonight or worsen.  IV fluid bolus certainly can be repeated if patient symptomatic.  Will hold off on continuous fluids for now as she is mentating well. 

## 2019-08-04 NOTE — Progress Notes (Signed)
Physical Therapy Treatment Patient Details Name: [name] MRN: [id number] DOB: 1945-07-06 Today's Date: 08/04/2019    History of Present Illness 74 yo female admitted with diffuse liver mets, obstructive jaundice with high risk for cholangitis, AKI. S/P ERCP with biliary stent placement 5/7. New onset of weakness, proximal LE weakness > distal LE weakness. Hx of met breast cancer.    PT Comments    Patient was limited by fatigue today but was agreeable to participate in therapy. She was able to maintain seated balance with single UE support at EOB and required cues for anterior weight shift of trunk to sit up EOB. Patient completed 1x sit<>stand with RW and mod assist +2 for safety. She required cues for posture while standing and was able to maintain balance for assistance with pericare prior to returning to bed. Patient will continue to benefit from skilled PT in acute setting and at SNF level for follow up. Acute PT will progress as able.   Follow Up Recommendations  SNF;Supervision/Assistance - 24 hour     Equipment Recommendations  Rolling walker with 5" wheels;Wheelchair (measurements PT);3in1 (PT)    Recommendations for Other Services       Precautions / Restrictions Precautions Precautions: Fall Precaution Comments: anxiety/fear of falling/fear of being dropped/enormous swelling throughout ABD and lower body Restrictions Weight Bearing Restrictions: No    Mobility  Bed Mobility Overal bed mobility: Needs Assistance Bed Mobility: Supine to Sit Rolling: Mod assist   Supine to sit: HOB elevated;Mod assist;Max assist Sit to supine: Max assist;Total assist;+2 for physical assistance;+2 for safety/equipment   General bed mobility comments: Pt limited by fatigue. required increased time for   Transfers Overall transfer level: Needs assistance Equipment used: Rolling walker (2 wheeled) Transfers: Sit to/from Stand Sit to Stand: +2 safety/equipment;Mod assist;From  elevated surface         General transfer comment: pt with bil UE on RW for support and for power up. cues to direct UE pressure downward rather than forward as pt was tilting the walker up. assist required to stabilize walker and to complete rise.   Ambulation/Gait          Stairs          Wheelchair Mobility    Modified Rankin (Stroke Patients Only)       Balance Overall balance assessment: Needs assistance Sitting-balance support: Single extremity supported;Feet supported Sitting balance-Leahy Scale: Fair Sitting balance - Comments: pt preferring bil UE support, but able to maintain balance with single UE support while brushing her hair.   Standing balance support: Bilateral upper extremity supported Standing balance-Leahy Scale: Poor Standing balance comment: pt with forward trunk lean while standing requiring cues for posture to raise trunk upright.              Cognition Arousal/Alertness: Awake/alert Behavior During Therapy: Anxious;WFL for tasks assessed/performed Overall Cognitive Status: Within Functional Limits for tasks assessed             Exercises      General Comments        Pertinent Vitals/Pain Pain Assessment: No/denies pain           PT Goals (current goals can now be found in the care plan section) Acute Rehab PT Goals Patient Stated Goal: To improve strength in legs PT Goal Formulation: With patient/family Time For Goal Achievement: 08/07/19 Potential to Achieve Goals: Good Progress towards PT goals: Progressing toward goals(limited by fatigue today)    Frequency    Min 3X/week  PT Plan Current plan remains appropriate    Co-evaluation PT/OT/SLP Co-Evaluation/Treatment: Yes Reason for Co-Treatment: For patient/therapist safety;To address functional/ADL transfers PT goals addressed during session: Mobility/safety with mobility;Balance OT goals addressed during session: ADL's and self-care      AM-PAC PT  "6 Clicks" Mobility   Outcome Measure  Help needed turning from your back to your side while in a flat bed without using bedrails?: Total Help needed moving from lying on your back to sitting on the side of a flat bed without using bedrails?: Total Help needed moving to and from a bed to a chair (including a wheelchair)?: A Lot Help needed standing up from a chair using your arms (e.g., wheelchair or bedside chair)?: A Lot Help needed to walk in hospital room?: Total Help needed climbing 3-5 steps with a railing? : Total 6 Click Score: 8    End of Session Equipment Utilized During Treatment: Gait belt Activity Tolerance: Patient tolerated treatment well Patient left: with call bell/phone within reach;in bed;with bed alarm set Nurse Communication: Mobility status PT Visit Diagnosis: Difficulty in walking, not elsewhere classified (R26.2);Muscle weakness (generalized) (M62.81)     Time: 9276-3943 PT Time Calculation (min) (ACUTE ONLY): 29 min  Charges:  $Therapeutic Activity: 8-22 mins                     Verner Mould, DPT Physical Therapist with [facility] [phone]  08/04/2019 2:29 PM

## 2019-08-04 NOTE — Progress Notes (Signed)
I s/w Dr. Burr Medico and given ok to treat today. The dose of Kadcyla has been reduced for elevated LFT's.  Kennith Center, Pharm.D., CPP 08/04/2019@1 :10 PM

## 2019-08-04 NOTE — Progress Notes (Signed)
LAYNI KREAMER   DOB:April 28, 1945   YY#:[id number]   LEX#:[id number]  Oncology follow up   Subjective: Aalani is very fatigue today, after participating physical therapy this morning.  She denies any other new symptoms.  Objective:  Vitals:   08/03/19 2139 08/04/19 1154  BP: (!) 110/50 (!) 96/58  Pulse:  80  Resp:  18  Temp:  98.5 F (36.9 C)  SpO2:  94%    Body mass index is 34.6 kg/m.  Intake/Output Summary (Last 24 hours) at 08/04/2019 1346 Last data filed at 08/04/2019 0639 Gross per 24 hour  Intake --  Output 900 ml  Net -900 ml     Resting comfortably  (+) jaundice    No peripheral adenopathy  Lungs clear -- no rales or rhonchi  Heart regular rate and rhythm  Abdomen soft nontender   MSK no focal spinal tenderness, no peripheral edema  Neuro nonfocal    CBG (last 3)  No results for input(s): GLUCAP in the last 72 hours.   Labs:  Urine Studies No results for input(s): UHGB, CRYS in the last 72 hours.  Invalid input(s): UACOL, UAPR, USPG, UPH, UTP, UGL, UKET, UBIL, UNIT, UROB, ULEU, UEPI, UWBC, URBC, UBAC, CAST, [place], [place]  Basic Metabolic Panel: Recent Labs  Lab 07/31/19 0305 07/31/19 0305 08/01/19 0336 08/01/19 0336 08/02/19 0249 08/02/19 0249 08/03/19 0517 08/04/19 0849  NA 131*  --  133*  --  130*  --  131* 132*  K 3.5   < > 4.0   < > 3.7   < > 4.6 4.0  CL 103  --  102  --  100  --  101 100  CO2 18*  --  21*  --  19*  --  17* 20*  GLUCOSE 113*  --  96  --  110*  --  123* 88  BUN 89*  --  99*  --  99*  --  90* 78*  CREATININE 1.48*  --  1.77*  --  1.66*  --  2.18* 2.01*  CALCIUM 7.4*  --  7.8*  --  7.8*  --  8.0* 7.8*  MG 2.5*  --   --   --   --   --   --   --    < > = values in this interval not displayed.   GFR Estimated Creatinine Clearance: 27.4 mL/min (A) (by C-G formula based on SCr of 2.01 mg/dL (H)). Liver Function Tests: Recent Labs  Lab 07/29/19 0252 07/31/19 0305 08/01/19 0336 08/02/19 0249 08/04/19 0849  AST 510* 455* 414* 387*  246*  ALT 253* 246* 239* 232* 183*  ALKPHOS 644* 616* 627* 664* 580*  BILITOT 13.4* 11.1* 11.3* 11.3* 10.7*  PROT 4.8* 4.3* 4.6* 4.6* 4.7*  ALBUMIN 1.5* 1.3* 1.3* 1.3* 1.3*   No results for input(s): LIPASE, AMYLASE in the last 168 hours. Recent Labs  Lab 08/01/19 0336  AMMONIA 40*   Coagulation profile Recent Labs  Lab 08/01/19 0336  INR 1.3*    CBC: Recent Labs  Lab 07/29/19 1005 07/29/19 1005 07/30/19 0302 07/30/19 1509 08/01/19 0336 08/02/19 0249 08/04/19 0849  WBC 17.6*   < > 18.4* 20.7* 17.8* 17.1* 14.5*  NEUTROABS 15.2*  --  16.1* 18.6* 15.9* 15.2*  --   HGB 11.1*   < > 10.3* 10.8* 9.8* 9.7* 9.7*  HCT 32.5*   < > 29.5* 31.5* 28.7* 28.5* 28.2*  MCV 92.1   < > 91.3 93.2 93.8 94.1 94.6  PLT 198   < > 189 214 220 219 248   < > = values in this interval not displayed.   Cardiac Enzymes: Recent Labs  Lab 07/31/19 0305 08/01/19 0336  CKTOTAL 1,487* 781*   BNP: Invalid input(s): POCBNP CBG: No results for input(s): GLUCAP in the last 168 hours. D-Dimer No results for input(s): DDIMER in the last 72 hours. Hgb A1c No results for input(s): HGBA1C in the last 72 hours. Lipid Profile No results for input(s): CHOL, HDL, LDLCALC, TRIG, CHOLHDL, LDLDIRECT in the last 72 hours. Thyroid function studies No results for input(s): TSH, T4TOTAL, T3FREE, THYROIDAB in the last 72 hours.  Invalid input(s): FREET3 Anemia work up Recent Labs    08/03/19 [place] 9.0   Microbiology Recent Results (from the past 240 hour(s))  Culture, Urine     Status: None   Collection Time: 07/30/19  2:47 PM   Specimen: Urine, Clean Catch  Result Value Ref Range Status   Specimen Description   Final    URINE, CLEAN CATCH Performed at [facility] [address], [place], [place] [address]    Special Requests   Final    NONE Performed at [facility], [place] [address], [place], [place] [address]    Culture   Final     NO GROWTH Performed at [facility], [place] [address], [place], [place] [address]    Report Status 08/01/2019 FINAL  Final  Culture, blood (routine x 2)     Status: None   Collection Time: 07/30/19  3:09 PM   Specimen: BLOOD  Result Value Ref Range Status   Specimen Description   Final    BLOOD LEFT ANTECUBITAL Performed at [facility] [address], [place], [place] [address]    Special Requests   Final    BOTTLES DRAWN AEROBIC ONLY Blood Culture adequate volume Performed at [facility] [address] [place], [place] [address]    Culture   Final    NO GROWTH 5 DAYS Performed at [facility], [place] [address], [place], [place] [address]    Report Status 08/04/2019 FINAL  Final  Culture, blood (routine x 2)     Status: None   Collection Time: 07/30/19  3:09 PM   Specimen: BLOOD  Result Value Ref Range Status   Specimen Description   Final    BLOOD BLOOD LEFT HAND Performed at [facility] [address] [place], [place] [address]    Special Requests   Final    BOTTLES DRAWN AEROBIC ONLY Blood Culture adequate volume   Culture   Final    NO GROWTH 5 DAYS Performed at [facility], [place] [address], [place], [place] [address]    Report Status 08/04/2019 FINAL  Final      Studies:  ECHOCARDIOGRAM COMPLETE  Result Date: 08/03/2019    ECHOCARDIOGRAM REPORT   Patient Name:   SHEELA MCCULLEY Date of Exam: 08/03/2019 Medical Rec #:  [id number]     Height:       65.0 in Accession #:    [id number]    Weight:       207.9 lb Date of Birth:  1945/12/09     BSA:          2.011 m Patient Age:    74 years      BP:  98/48 mmHg Patient Gender: F             HR:           82 bpm. Exam Location:  Inpatient Procedure: 2D Echo, Cardiac Doppler, Color Doppler and Strain Analysis Indications:    Z51.11 Encounter for antineoplastic chemotheraphy  History:        Patient has no prior history of Echocardiogram  examinations.                 Signs/Symptoms:Syncope; Risk Factors:Hypertension, Dyslipidemia                 and GERD.  Sonographer:    Tiffany Dance Referring Phys: [id number] [place]  1. Left ventricular ejection fraction, by estimation, is 65 to 70%. The left ventricle has normal function. The left ventricle has no regional wall motion abnormalities. There is mild left ventricular hypertrophy. Left ventricular diastolic parameters are consistent with Grade I diastolic dysfunction (impaired relaxation).  2. Right ventricular systolic function is normal. The right ventricular size is normal. There is normal pulmonary artery systolic pressure.  3. The mitral valve is grossly normal. Trivial mitral valve regurgitation.  4. The aortic valve is tricuspid. Aortic valve regurgitation is not visualized.  5. The inferior vena cava is normal in size with greater than 50% respiratory variability, suggesting right atrial pressure of 3 mmHg. FINDINGS  Left Ventricle: Left ventricular ejection fraction, by estimation, is 65 to 70%. The left ventricle has normal function. The left ventricle has no regional wall motion abnormalities. Global longitudinal strain performed but not reported based on interpreter judgement due to suboptimal tracking. The left ventricular internal cavity size was normal in size. There is mild left ventricular hypertrophy. Left ventricular diastolic parameters are consistent with Grade I diastolic dysfunction (impaired relaxation). Indeterminate filling pressures. Right Ventricle: The right ventricular size is normal. No increase in right ventricular wall thickness. Right ventricular systolic function is normal. There is normal pulmonary artery systolic pressure. The tricuspid regurgitant velocity is 2.58 m/s, and  with an assumed right atrial pressure of 3 mmHg, the estimated right ventricular systolic pressure is 65.9 mmHg. Left Atrium: Left atrial size was normal in size. Right Atrium:  Right atrial size was normal in size. Pericardium: There is no evidence of pericardial effusion. Mitral Valve: The mitral valve is grossly normal. Trivial mitral valve regurgitation. Tricuspid Valve: The tricuspid valve is grossly normal. Tricuspid valve regurgitation is mild. Aortic Valve: The aortic valve is tricuspid. Aortic valve regurgitation is not visualized. Pulmonic Valve: The pulmonic valve was grossly normal. Pulmonic valve regurgitation is not visualized. Aorta: The aortic root and ascending aorta are structurally normal, with no evidence of dilitation. Venous: The inferior vena cava is normal in size with greater than 50% respiratory variability, suggesting right atrial pressure of 3 mmHg. IAS/Shunts: No atrial level shunt detected by color flow Doppler.  LEFT VENTRICLE PLAX 2D LVIDd:         4.00 cm  Diastology LVIDs:         2.30 cm  LV e' lateral:   12.05 cm/s LV PW:         1.20 cm  LV E/e' lateral: 7.1 LV IVS:        1.00 cm  LV e' medial:    8.75 cm/s LVOT diam:     2.10 cm  LV E/e' medial:  9.8 LV SV:         74 LV SV Index:   37 LVOT  Area:     3.46 cm  RIGHT VENTRICLE             IVC RV Basal diam:  2.80 cm     IVC diam: 1.60 cm RV S prime:     18.00 cm/s TAPSE (M-mode): 2.2 cm LEFT ATRIUM             Index       RIGHT ATRIUM           Index LA diam:        4.30 cm 2.14 cm/m  RA Area:     12.00 cm LA Vol (A2C):   56.0 ml 27.85 ml/m RA Volume:   25.70 ml  12.78 ml/m LA Vol (A4C):   30.5 ml 15.17 ml/m LA Biplane Vol: 43.6 ml 21.68 ml/m  AORTIC VALVE LVOT Vmax:   108.00 cm/s LVOT Vmean:  68.600 cm/s LVOT VTI:    0.214 m  AORTA Ao Root diam: 3.20 cm Ao Asc diam:  2.80 cm MITRAL VALVE               TRICUSPID VALVE MV Area (PHT): 4.68 cm    TR Peak grad:   26.6 mmHg MV Decel Time: 162 msec    TR Vmax:        258.00 cm/s MV E velocity: 85.70 cm/s MV A velocity: 63.60 cm/s  SHUNTS MV E/A ratio:  1.35        Systemic VTI:  0.21 m                            Systemic Diam: 2.10 cm Lyman Bishop MD  Electronically signed by Lyman Bishop MD Signature Date/Time: 08/03/2019/11:49:39 AM    Final     Assessment: 74 y.o. Caucasian female, with past medical history of breast cancer and parotid gland carcinoma  1. 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  AKI with hyponatremia and hypokalemia, improving  3.  History of stage I right breast cancer in 2019, on adjuvant anastrozole 4.  History of parotid gland carcinoma in 2020, status post surgery and radiation 5. HTN  6.  Deconditioning with significant LE weakness   Recommendations  -I reviewed her lab from this morning, and echo from yesterday, both are adequate for initiation of first cycle chemo Kadcyla.  Due to her hyperbilirubinemia and impaired renal function, I will reduce the dose for first cycle.  -Chemo consent obtained today patient signed the consent form -I spoke with our pharmacy and the patient's nurse, will proceed treatments this afternoon -I will order Compazine as needed for nausea -Patient's insurance denied inpatient rehab, I encouraged her to consider SNF for rehab -will f/u   Truitt Merle  08/04/2019

## 2019-08-04 NOTE — TOC Progression Note (Addendum)
Transition of Care [facility]) - Progression Note    Patient Details  Name: [name] MRN: [id number] Date of Birth: Apr 16, 1945  Transition of Care [facility]) CM/SW Contact  Lajarvis Italiano, Marjie Skiff, RN Phone Number: 08/04/2019, 1:53 PM  Clinical Narrative:    This CM was informed by CIR liaison that pt was denied by insurance for CIR. This CM informed pt of this information and spoke with her at bedside for back up plan for DC. [facility] was only SNF bed offer and pt accepted bed at [facility]. This CM notified [facility] liaison that pt accepts bed. SNF auth started with [facility]. Pt to receive Covid test today. Passr # [id number]   Expected Discharge Plan: [place] Barriers to Discharge: Continued Medical Work up  Expected Discharge Plan and Services Expected Discharge Plan: [place] In-house Referral: Clinical Social Work Discharge Planning Services: CM Consult   Living arrangements for the past 2 months: Single Family Home                        Social Determinants of Health (SDOH) Interventions    Readmission Risk Interventions No flowsheet data found.

## 2019-08-04 NOTE — Progress Notes (Signed)
Patient is moving toward comfort care, gave permission to have two sons and husband on visitor list.  Also discussing having a family meeting one time with daughter in laws present for that decision.

## 2019-08-04 NOTE — Progress Notes (Signed)
Occupational Therapy Treatment Patient Details Name: [name] MRN: [id number] DOB: 1945/12/19 Today's Date: 08/04/2019    History of present illness 74 yo female admitted with diffuse liver mets, obstructive jaundice with high risk for cholangitis, AKI. S/P ERCP with biliary stent placement 5/7. New onset of weakness, proximal LE weakness > distal LE weakness. Hx of met breast cancer.   OT comments  Treatment focused on sitting edge of bed and improving sitting balance and participation in short functional activity. Patient fatigued today and required verbal encouragement to participate. Patient predominantly mod assist to maintan sitting balance - needing two hands to hold herself forward. Patient able to brush her hair - but gave up quickly due to fatigue. At times patient appears anxious and restless. She is agreeable to therapy but not motivated due to "feeling wiped out." Cont POC     Follow Up Recommendations  SNF    Equipment Recommendations  Other (comment)    Recommendations for Other Services      Precautions / Restrictions Precautions Precautions: Fall Precaution Comments: anxiety/fear of falling/fear of being dropped/enormous swelling throughout ABD and lower body Restrictions Weight Bearing Restrictions: No Other Position/Activity Restrictions: WBAT       Mobility Bed Mobility Overal bed mobility: Needs Assistance Bed Mobility: Supine to Sit Rolling: Mod assist   Supine to sit: HOB elevated;Mod assist;Max assist Sit to supine: Max assist;Total assist;+2 for physical assistance;+2 for safety/equipment   General bed mobility comments: Pt limited by fatigue. required increased time for   Transfers Overall transfer level: Needs assistance Equipment used: Rolling walker (2 wheeled) Transfers: Sit to/from Stand Sit to Stand: +2 safety/equipment;Mod assist;From elevated surface         General transfer comment: pt with bil UE on RW for support and for power  up. cues to direct UE pressure downward rather than forward as pt was tilting the walker up. assist required to stabilize walker and to complete rise.     Balance Overall balance assessment: Needs assistance Sitting-balance support: Single extremity supported;Feet supported Sitting balance-Leahy Scale: Fair Sitting balance - Comments: pt preferring bil UE support, but able to maintain balance with single UE support while brushing her hair.   Standing balance support: Bilateral upper extremity supported Standing balance-Leahy Scale: Poor Standing balance comment: pt with forward trunk lean while standing requiring cues for posture to raise trunk upright.                            ADL either performed or assessed with clinical judgement   ADL                                         General ADL Comments: Patient seated at side of bed to perform functional activity. Therapist provided patient with verbal and tactile cues to reduce posterior lean. Patient needing one to two hand holds to maintain position.  Patient brushed her hair with verbal cues and encouragement to continue. Patient able to brush front of hair and grossly through back. Patient restless and fatigued quickly. Predominantly mod assist to maintain anterior sitting position.     Vision       Perception     Praxis      Cognition Arousal/Alertness: Awake/alert Behavior During Therapy: Anxious;WFL for tasks assessed/performed Overall Cognitive Status: Within Functional Limits for tasks assessed  General Comments: Patient alert and oriented. Appears to have delayed processing and diffculty maintaining attention        Exercises     Shoulder Instructions       General Comments      Pertinent Vitals/ Pain       Pain Assessment: No/denies pain  Home Living                                          Prior  Functioning/Environment              Frequency  Min 2X/week        Progress Toward Goals  OT Goals(current goals can now be found in the care plan section)     Acute Rehab OT Goals Patient Stated Goal: To improve strength in legs  Plan      Co-evaluation    PT/OT/SLP Co-Evaluation/Treatment: Yes Reason for Co-Treatment: For patient/therapist safety;To address functional/ADL transfers PT goals addressed during session: Mobility/safety with mobility;Balance OT goals addressed during session: ADL's and self-care      AM-PAC OT "6 Clicks" Daily Activity     Outcome Measure                    End of Session Equipment Utilized During Treatment: Rolling walker;Gait belt  OT Visit Diagnosis: Other abnormalities of gait and mobility (R26.89);Muscle weakness (generalized) (M62.81);Repeated falls (R29.6)   Activity Tolerance Patient limited by fatigue   Patient Left in bed;with bed alarm set   Nurse Communication Mobility status        Time: 9359-4090 OT Time Calculation (min): 29 min  Charges: OT General Charges $OT Visit: 1 Visit OT Treatments $Therapeutic Activity: 8-22 mins  Derl Barrow, OTR/L [place]  Office [phone]    Lenward Chancellor 08/04/2019, 4:01 PM

## 2019-08-04 NOTE — Progress Notes (Signed)
Inpatient Rehab Admissions Coordinator:   Notified by [employer] company that request for CIR has been denied.  Pt will need to seek therapy in an alternate level of care (home health, SNF, outpatient).  Will let pt/family and TOC team know and sign off for CIR.  Shann Medal, PT, DPT Admissions Coordinator [phone] 08/04/19  8:55 AM

## 2019-08-04 NOTE — Progress Notes (Signed)
Oncology follow up note   Chemotherapy Kadcyla was started in the mid afternoon, patient's systolic blood pressure was in mid 90 before chemo started, and it dropped to mid 70 in 10 minutes after Kadcyla infusion started.  Other vital signs were stable, patient denies sweating, or any new discomfort, no rash, new edema or any other allergy reactions.  Chemotherapy was held.  Her systolic blood pressure did improve to mid 88-low 90 afterwards. Pt was transferred to stepdown unit for close monitoring.  I spoke with hospitalist Dr. Earnest Conroy, and she will give 250 cc normal saline bolus, a dose hydrocortisone, and albumin infusion.  I talked to patient and her husband at the bedside after she was transferred to ICU.  We again reviewed the overall incurable nature of her metastatic breast cancer, and very poor prognosis.  Chemotherapy was attempted today, we had to stop due to the unstable vital sign.  She has been mildly hypotensive since yesterday, I do not think her hypotension was all related to chemo infusion, but certainly will stop today due to the worsening hypotension.  I discussed CODE STATUS with patient and her husband [name] in length, I recommend DNR/DNI, and avoid invasive procedures, giving her extremely poor prognosis.  I anticipate her life expectancy could be a few weeks to a few months, and I do not think she will recover or turn around without cancer treatment, but unfortunately chemo treatment at this point may shorten her life given her unstable condition and multiorgan dysfunction.  Pt and her husband was not able to make a final decision about DNR, she remains full code for now. I encouraged her to think about tonight, and discussed with her sons also.  We will continue follow-up tomorrow.  I spent a total of 45 mins for her visit today.   Truitt Merle  08/04/2019

## 2019-08-04 NOTE — Progress Notes (Signed)
Chemotherapy consent obtained, education completed. All questions and concerns discussed with pt. Booklet provided. Chemotherapy dosage and calculations verified with second chemotherapy nurse, Aldean Baker, RN.

## 2019-08-04 NOTE — Progress Notes (Signed)
Pt hypotensive, MD paged Rapid Response RN paged. Pt responsive but lethargic. Dr. Burr Medico to assess pt on unit. Infusion paused.

## 2019-08-04 NOTE — Consult Note (Signed)
NAME:  [name], MRN:  [id number], DOB:  [birth year], LOS: 68 ADMISSION DATE:  07/20/2019, CONSULTATION DATE:  08/04/19 REFERRING MD:  Jabier Mutton MD, CHIEF COMPLAINT:  Hypotension  Brief History   74 year old with hypertension, breast/parotid cancer admitted with liver mets, obstructive jaundice diagnosed with metastatic breast cancer on biopsy.  Admitted on 5/4 with hypotension after liver biopsy which responded to IV fluid.  Underwent ERCP on 5/7 with severe biliary stricture s/p stent placement.  Received her first chemo dose of Kadcyla today.  Developed hypotension about 10 minutes into the infusion and transferred to ICU for further management, PCCM consulted. Patient got 250 bolus of normal saline and starting albumin, midodrine and stress dose steroids.  In the  Past Medical History    has a past medical history of Arthritis, Cancer ([place]) (06/2017), GERD (gastroesophageal reflux disease), History of radiation therapy (08/05/18- 09/22/18), History of radiation therapy (09/01/2017- 09/29/2017), Hyperlipidemia, Hypertension, PONV (postoperative nausea and vomiting), Syncope, Vaso vagal episode, and Vertigo.  Significant Hospital Events   07/20/19 Admit 08/04/19 rapid response after hypotension during chemo.  Transferred to ICU.  Consults:  PCCM, oncology  Procedures:  .  Significant Diagnostic Tests:  CT head 5/9-no acute intracranial findings, chronic ischemic microvascular white matter disease  Echo 08/03/2019-LVEF 65-70%, mild LVH, grade 1 diastolic dysfunction  Micro Data:  Blood cultures 5/14-no growth  Antimicrobials:     Interim history/subjective:    Objective   Blood pressure (!) 88/38, pulse 84, temperature 97.7 F (36.5 C), temperature source Oral, resp. rate (!) 21, height 5\' 5"  (1.651 m), weight 94.3 kg, SpO2 95 %.        Intake/Output Summary (Last 24 hours) at 08/04/2019 1738 Last data filed at 08/04/2019 1500 Gross per 24 hour  Intake 20.75 ml  Output 900  ml  Net -879.25 ml   Filed Weights   07/31/19 0434 08/03/19 0441 08/04/19 1300  Weight: 94.4 kg 94.3 kg 94.3 kg    Examination: Blood pressure (!) 88/38, pulse 84, temperature 97.7 F (36.5 C), temperature source Oral, resp. rate (!) 21, height 5\' 5"  (1.651 m), weight 94.3 kg, SpO2 95 %. Gen:      Chronically ill-appearing HEENT:  EOMI, sclera, mild icterus Neck:     No masses; no thyromegaly Lungs:    Clear to auscultation bilaterally; normal respiratory effort CV:         Regular rate and rhythm; no murmurs Abd:      + bowel sounds; soft, non-tender; no palpable masses, no distension Ext:    No edema; adequate peripheral perfusion Skin:      Warm and dry; no rash Neuro: alert and oriented x 3 Psych: normal mood and affect  Resolved Hospital Problem list     Assessment & Plan:  Hypotension, shock She has had intermittent hypotension during this admission with third spacing, edema. With an acute drop in BP during chemo. She is appears to be responding to fluids.  Agree with albumin, midodrine and stress dose steroids Discussed central line placement with patient and husband.  They want to avoid any invasive procedure unless absolutely necessary. We will continue close monitoring in the ICU Can use peripheral pressors if needed  AKI, nongap acidosis Monitor urine output and creatinine IV fluids, albumin as above  Obstructive jaundice due to malignant stricture status post ERCP Follow LFTs  Metastatic breast cancer Management per oncology.  Best practice:  Diet:NPO Pain/Anxiety/Delirium protocol (if indicated): NA VAP protocol (if indicated): NA DVT prophylaxis:  Lovenox GI prophylaxis: pepcid Glucose control: Monitor Mobility: Bed Code Status: Full Family Communication: Patient and husband updated Disposition: ICU  Labs   CBC: Recent Labs  Lab 07/29/19 1005 07/29/19 1005 07/30/19 0302 07/30/19 1509 08/01/19 0336 08/02/19 0249 08/04/19 0849  WBC 17.6*    < > 18.4* 20.7* 17.8* 17.1* 14.5*  NEUTROABS 15.2*  --  16.1* 18.6* 15.9* 15.2*  --   HGB 11.1*   < > 10.3* 10.8* 9.8* 9.7* 9.7*  HCT 32.5*   < > 29.5* 31.5* 28.7* 28.5* 28.2*  MCV 92.1   < > 91.3 93.2 93.8 94.1 94.6  PLT 198   < > 189 214 220 219 248   < > = values in this interval not displayed.    Basic Metabolic Panel: Recent Labs  Lab 07/31/19 0305 08/01/19 0336 08/02/19 0249 08/03/19 0517 08/04/19 0849  NA 131* 133* 130* 131* 132*  K 3.5 4.0 3.7 4.6 4.0  CL 103 102 100 101 100  CO2 18* 21* 19* 17* 20*  GLUCOSE 113* 96 110* 123* 88  BUN 89* 99* 99* 90* 78*  CREATININE 1.48* 1.77* 1.66* 2.18* 2.01*  CALCIUM 7.4* 7.8* 7.8* 8.0* 7.8*  MG 2.5*  --   --   --   --    GFR: Estimated Creatinine Clearance: 27.4 mL/min (A) (by C-G formula based on SCr of 2.01 mg/dL (H)). Recent Labs  Lab 07/30/19 0302 07/30/19 0302 07/30/19 0733 07/30/19 1509 07/31/19 0305 08/01/19 0336 08/02/19 0249 08/04/19 0849  WBC 18.4*   < >  --  20.7*  --  17.8* 17.1* 14.5*  LATICACIDVEN 2.0*  --  2.4*  --  2.3* 1.9  --   --    < > = values in this interval not displayed.    Liver Function Tests: Recent Labs  Lab 07/29/19 0252 07/31/19 0305 08/01/19 0336 08/02/19 0249 08/04/19 0849  AST 510* 455* 414* 387* 246*  ALT 253* 246* 239* 232* 183*  ALKPHOS 644* 616* 627* 664* 580*  BILITOT 13.4* 11.1* 11.3* 11.3* 10.7*  PROT 4.8* 4.3* 4.6* 4.6* 4.7*  ALBUMIN 1.5* 1.3* 1.3* 1.3* 1.3*   No results for input(s): LIPASE, AMYLASE in the last 168 hours. Recent Labs  Lab 08/01/19 0336  AMMONIA 40*    ABG No results found for: PHART, PCO2ART, PO2ART, HCO3, TCO2, ACIDBASEDEF, O2SAT   Coagulation Profile: Recent Labs  Lab 08/01/19 0336  INR 1.3*    Cardiac Enzymes: Recent Labs  Lab 07/31/19 0305 08/01/19 0336  CKTOTAL 1,487* 781*    HbA1C: Hgb A1c MFr Bld  Date/Time Value Ref Range Status  07/20/2019 07:58 PM 5.9 (H) 4.8 - 5.6 % Final    Comment:    (NOTE) Pre diabetes:           5.7%-6.4% Diabetes:              >6.4% Glycemic control for   <7.0% adults with diabetes     CBG: No results for input(s): GLUCAP in the last 168 hours.  Review of Systems:   REVIEW OF SYSTEMS:   All negative; except for those that are bolded, which indicate positives.  Constitutional: weight loss, weight gain, night sweats, fevers, chills, fatigue, weakness.  HEENT: headaches, sore throat, sneezing, nasal congestion, post nasal drip, difficulty swallowing, tooth/dental problems, visual complaints, visual changes, ear aches. Neuro: difficulty with speech, weakness, numbness, ataxia. CV:  chest pain, orthopnea, PND, swelling in lower extremities, dizziness, palpitations, syncope.  Resp: cough, hemoptysis, dyspnea, wheezing. GI:  heartburn, indigestion, abdominal pain, nausea, vomiting, diarrhea, constipation, change in bowel habits, loss of appetite, hematemesis, melena, hematochezia.  GU: dysuria, change in color of urine, urgency or frequency, flank pain, hematuria. MSK: joint pain or swelling, decreased range of motion. Psych: change in mood or affect, depression, anxiety, suicidal ideations, homicidal ideations. Skin: rash, itching, bruising.  Past Medical History  She,  has a past medical history of Arthritis, Cancer ([place]) (06/2017), GERD (gastroesophageal reflux disease), History of radiation therapy (08/05/18- 09/22/18), History of radiation therapy (09/01/2017- 09/29/2017), Hyperlipidemia, Hypertension, PONV (postoperative nausea and vomiting), Syncope, Vaso vagal episode, and Vertigo.   Surgical History    Past Surgical History:  Procedure Laterality Date  . APPENDECTOMY    . BILIARY DILATION  07/23/2019   Procedure: BILIARY DILATION;  Surgeon: Rush Landmark Telford Nab., MD;  Location: [facility];  Service: Gastroenterology;;  . BILIARY STENT PLACEMENT N/A 07/23/2019   Procedure: BILIARY STENT PLACEMENT;  Surgeon: Irving Copas., MD;  Location: [facility];  Service:  Gastroenterology;  Laterality: N/A;  . BREAST LUMPECTOMY WITH RADIOACTIVE SEED AND SENTINEL LYMPH NODE BIOPSY Right 07/30/2017   Procedure: BREAST LUMPECTOMY WITH RADIOACTIVE SEED AND SENTINEL LYMPH NODE BIOPSY;  Surgeon: [name], MD;  Location: [facility];  Service: General;  Laterality: Right;  . BREAST SURGERY  08/27/11   right lumpectomy w/needle localization  . BUNIONECTOMY Bilateral 2000   bilateral -hardware remains  . CYST REMOVAL NECK     sebaceous  cyst from neck  . DILATION AND CURETTAGE OF UTERUS    . ERCP N/A 07/23/2019   Procedure: ENDOSCOPIC RETROGRADE CHOLANGIOPANCREATOGRAPHY (ERCP);  Surgeon: Irving Copas., MD;  Location: [facility];  Service: Gastroenterology;  Laterality: N/A;  . INTRAUTERINE DEVICE INSERTION    . IUD REMOVAL    . LAPAROSCOPIC APPENDECTOMY N/A 03/27/2017   Procedure: APPENDECTOMY LAPAROSCOPIC;  Surgeon: Alphonsa Overall, MD;  Location: [facility];  Service: General;  Laterality: N/A;  . PANCREATIC STENT PLACEMENT  07/23/2019   Procedure: PANCREATIC STENT PLACEMENT;  Surgeon: Irving Copas., MD;  Location: [facility];  Service: Gastroenterology;;  . Lindell Spar Left 11/28/2017   SUPERFICIAL  . PAROTIDECTOMY Left 11/28/2017   Procedure: LEFT SUPEFICIAL PAROTIDECTOMY WITH OBSERVATION;  Surgeon: Jerrell Belfast, MD;  Location: [facility];  Service: ENT;  Laterality: Left;  . PAROTIDECTOMY Left 06/29/2018   Dr. Nicolette [facility] at [facility] Deep parotidectomy  . Removal Right Straphenous vein  2011  . SPHINCTEROTOMY  07/23/2019   Procedure: SPHINCTEROTOMY;  Surgeon: Mansouraty, Telford Nab., MD;  Location: [facility];  Service: Gastroenterology;;  . TONSILLECTOMY     child  . TOTAL HIP ARTHROPLASTY Right 11/29/2015   Procedure: RIGHT TOTAL HIP ARTHROPLASTY ANTERIOR APPROACH;  Surgeon: Gaynelle Arabian, MD;  Location: [facility];  Service: Orthopedics;  Laterality: Right;     Social History   reports that she quit smoking  about 33 years ago. She has a 5.00 pack-year smoking history. She has never used smokeless tobacco. She reports current alcohol use. She reports that she does not use drugs.   Family History   Her family history includes Cancer in her mother and paternal aunt.   Allergies Allergies  Allergen Reactions  . Dermacinrx [facility] [place] Dermatitis    Dermabond caused contact dermatitis   . Iohexol Hives    Patient broke out in hives, needs pre meds if given contrast in the future per Dr. Carlis Abbott // [facility]      Home Medications  Prior to Admission medications  Medication Sig Start Date End Date Taking? Authorizing Provider  anastrozole (ARIMIDEX) 1 MG tablet TAKE 1 TABLET(1 MG) BY MOUTH DAILY Patient taking differently: Take 1 mg by mouth daily. TAKE 1 TABLET(1 MG) BY MOUTH DAILY 10/09/18  Yes Truitt Merle, MD  atorvastatin (LIPITOR) 80 MG tablet Take 80 mg by mouth every evening.   Yes [provider]  Calcium-Phosphorus-Vitamin D (CITRACAL +D3 PO) Take 1 tablet by mouth daily.   Yes [provider]  Cholecalciferol (VITAMIN D3) 2000 units TABS Take 2,000 Units by mouth daily.   Yes [provider]  famotidine (PEPCID) 20 MG tablet Take 20 mg by mouth 2 (two) times daily. Morning and bedtime   Yes [provider]  FINACEA 15 % cream Apply 1 application topically daily as needed (rosacea). Applied to nose. 09/13/15  Yes [provider]  LUTEIN-ZEAXANTHIN PO Take 1 tablet by mouth daily.   Yes [provider]  MICARDIS HCT 40-12.5 MG per tablet Take 1 tablet by mouth daily.  06/26/11  Yes [provider]  ZETIA 10 MG tablet Take 10 mg by mouth every evening.  09/16/11  Yes [provider]  diphenhydrAMINE (BENADRYL) 50 MG tablet Take 1 tablet at 1 hour before CT scans. Patient not taking: Reported on 07/06/2019 06/04/19   Eppie Gibson, MD  predniSONE (DELTASONE) 50 MG tablet Take 1 tablet at 13 hours, 7 hours, and 1 hour before CT  scans. Patient not taking: Reported on 07/20/2019 06/04/19   Eppie Gibson, MD     Critical care time:     The patient is critically ill with multiple organ system failure and requires high complexity decision making for assessment and support, frequent evaluation and titration of therapies, advanced monitoring, review of radiographic studies and interpretation of complex data.   Critical Care Time devoted to patient care services, exclusive of separately billable procedures, described in this note is 35 minutes.   Marshell Garfinkel MD [facility] Please see [email or web address] for pager details.  08/04/2019, 5:38 PM

## 2019-08-05 ENCOUNTER — Inpatient Hospital Stay: Payer: Medicare PPO | Admitting: Hematology

## 2019-08-05 ENCOUNTER — Inpatient Hospital Stay: Payer: Medicare PPO

## 2019-08-05 DIAGNOSIS — Z170 Estrogen receptor positive status [ER+]: Secondary | ICD-10-CM

## 2019-08-05 DIAGNOSIS — Z7189 Other specified counseling: Secondary | ICD-10-CM

## 2019-08-05 DIAGNOSIS — R160 Hepatomegaly, not elsewhere classified: Secondary | ICD-10-CM

## 2019-08-05 DIAGNOSIS — Z515 Encounter for palliative care: Secondary | ICD-10-CM

## 2019-08-05 DIAGNOSIS — C50311 Malignant neoplasm of lower-inner quadrant of right female breast: Secondary | ICD-10-CM

## 2019-08-05 LAB — COMPREHENSIVE METABOLIC PANEL
ALT: 171 U/L — ABNORMAL HIGH (ref 0–44)
AST: 225 U/L — ABNORMAL HIGH (ref 15–41)
Albumin: 1.9 g/dL — ABNORMAL LOW (ref 3.5–5.0)
Alkaline Phosphatase: 526 U/L — ABNORMAL HIGH (ref 38–126)
Anion gap: 14 (ref 5–15)
BUN: 111 mg/dL — ABNORMAL HIGH (ref 8–23)
CO2: 18 mmol/L — ABNORMAL LOW (ref 22–32)
Calcium: 8 mg/dL — ABNORMAL LOW (ref 8.9–10.3)
Chloride: 103 mmol/L (ref 98–111)
Creatinine, Ser: 1.79 mg/dL — ABNORMAL HIGH (ref 0.44–1.00)
GFR calc Af Amer: 32 mL/min — ABNORMAL LOW (ref >60)
GFR calc non Af Amer: 27 mL/min — ABNORMAL LOW (ref >60)
Glucose, Bld: 143 mg/dL — ABNORMAL HIGH (ref 70–99)
Potassium: 4 mmol/L (ref 3.5–5.1)
Sodium: 135 mmol/L (ref 135–145)
Total Bilirubin: 13.1 mg/dL — ABNORMAL HIGH (ref 0.3–1.2)
Total Protein: 5.3 g/dL — ABNORMAL LOW (ref 6.5–8.1)

## 2019-08-05 LAB — CORTISOL-AM, BLOOD: Cortisol - AM: 86.2 ug/dL — ABNORMAL HIGH (ref 6.7–22.6)

## 2019-08-05 MED ORDER — SIMETHICONE 80 MG PO CHEW: 80.0000 mg | CHEWABLE_TABLET | Freq: Four times a day (QID) | ORAL | Status: DC | PRN

## 2019-08-05 NOTE — TOC Progression Note (Signed)
Transition of Care [facility]) - Progression Note    Patient Details  Name: [name] MRN: [id number] Date of Birth: [birth year]  [facility] CM/SW Contact  Leeroy Cha, RN Phone Number: 08/05/2019, 8:23 AM  Clinical Narrative:    awaje abnd reactive, iv ns at 75cc/hr, moving toward comfort care by the husband, bun =78/creat=2.01/ wbc 14.5,9.7. Awaiting bed at [facility]. Insurance auth started   Expected Discharge Plan: [place] Barriers to Discharge: Continued Medical Work up  Expected Discharge Plan and Services Expected Discharge Plan: [place] In-house Referral: Clinical Social Work Discharge Planning Services: CM Consult   Living arrangements for the past 2 months: Single Family Home                                       Social Determinants of Health (SDOH) Interventions    Readmission Risk Interventions No flowsheet data found.

## 2019-08-05 NOTE — Progress Notes (Addendum)
[name]   DOB:11-Aug-1945   XV#:[id number]   JKD#:[id number]  Oncology follow up   Subjective: Still very fatigued today.  Legs very edematous.  She has no other specific complaints today.  Objective:  Vitals:   08/05/19 0900 08/05/19 1000  BP: (!) 125/50 (!) 106/51  Pulse: 88 86  Resp: (!) 23 20  Temp:    SpO2: 98% 94%    Body mass index is 34.6 kg/m.  Intake/Output Summary (Last 24 hours) at 08/05/2019 1150 Last data filed at 08/05/2019 1100 Gross per 24 hour  Intake 281.34 ml  Output 2400 ml  Net -2118.66 ml     Resting comfortably  (+) jaundice    No peripheral adenopathy  Lungs clear -- no rales or rhonchi  Heart regular rate and rhythm  Abdomen soft nontender   MSK no focal spinal tenderness, legs with anasarca  Neuro nonfocal    CBG (last 3)  No results for input(s): GLUCAP in the last 72 hours.   Labs:  Urine Studies No results for input(s): UHGB, CRYS in the last 72 hours.  Invalid input(s): UACOL, UAPR, USPG, UPH, UTP, UGL, UKET, UBIL, UNIT, UROB, ULEU, UEPI, UWBC, URBC, UBAC, CAST, [place], [place]  Basic Metabolic Panel: Recent Labs  Lab 07/31/19 0305 07/31/19 0305 08/01/19 0336 08/01/19 0336 08/02/19 0249 08/02/19 0249 08/03/19 0517 08/03/19 0517 08/04/19 0849 08/05/19 0939  NA 131*   < > 133*  --  130*  --  131*  --  132* 135  K 3.5   < > 4.0   < > 3.7   < > 4.6   < > 4.0 4.0  CL 103   < > 102  --  100  --  101  --  100 103  CO2 18*   < > 21*  --  19*  --  17*  --  20* 18*  GLUCOSE 113*   < > 96  --  110*  --  123*  --  88 143*  BUN 89*   < > 99*  --  99*  --  90*  --  78* 111*  CREATININE 1.48*   < > 1.77*  --  1.66*  --  2.18*  --  2.01* 1.79*  CALCIUM 7.4*   < > 7.8*  --  7.8*  --  8.0*  --  7.8* 8.0*  MG 2.5*  --   --   --   --   --   --   --   --   --    < > = values in this interval not displayed.   GFR Estimated Creatinine Clearance: 30.8 mL/min (A) (by C-G formula based on SCr of 1.79 mg/dL (H)). Liver Function Tests: Recent  Labs  Lab 07/31/19 0305 08/01/19 0336 08/02/19 0249 08/04/19 0849 08/05/19 0939  AST 455* 414* 387* 246* 225*  ALT 246* 239* 232* 183* 171*  ALKPHOS 616* 627* 664* 580* 526*  BILITOT 11.1* 11.3* 11.3* 10.7* 13.1*  PROT 4.3* 4.6* 4.6* 4.7* 5.3*  ALBUMIN 1.3* 1.3* 1.3* 1.3* 1.9*   No results for input(s): LIPASE, AMYLASE in the last 168 hours. Recent Labs  Lab 08/01/19 0336  AMMONIA 40*   Coagulation profile Recent Labs  Lab 08/01/19 0336  INR 1.3*    CBC: Recent Labs  Lab 07/30/19 0302 07/30/19 1509 08/01/19 0336 08/02/19 0249 08/04/19 0849  WBC 18.4* 20.7* 17.8* 17.1* 14.5*  NEUTROABS 16.1* 18.6* 15.9* 15.2*  --   HGB 10.3*  10.8* 9.8* 9.7* 9.7*  HCT 29.5* 31.5* 28.7* 28.5* 28.2*  MCV 91.3 93.2 93.8 94.1 94.6  PLT 189 214 220 219 248   Cardiac Enzymes: Recent Labs  Lab 07/31/19 0305 08/01/19 0336  CKTOTAL 1,487* 781*   BNP: Invalid input(s): POCBNP CBG: No results for input(s): GLUCAP in the last 168 hours. D-Dimer No results for input(s): DDIMER in the last 72 hours. Hgb A1c No results for input(s): HGBA1C in the last 72 hours. Lipid Profile No results for input(s): CHOL, HDL, LDLCALC, TRIG, CHOLHDL, LDLDIRECT in the last 72 hours. Thyroid function studies No results for input(s): TSH, T4TOTAL, T3FREE, THYROIDAB in the last 72 hours.  Invalid input(s): FREET3 Anemia work up Recent Labs    08/03/19 [place] 9.0   Microbiology Recent Results (from the past 240 hour(s))  Culture, Urine     Status: None   Collection Time: 07/30/19  2:47 PM   Specimen: Urine, Clean Catch  Result Value Ref Range Status   Specimen Description   Final    URINE, CLEAN CATCH Performed at [facility] [address], [place], [place] [address]    Special Requests   Final    NONE Performed at [facility], [place] [address], [place], [place] [address]    Culture   Final    NO GROWTH Performed at [facility], [place] [address], [place], [place] [address]    Report Status 08/01/2019 FINAL  Final  Culture, blood (routine x 2)     Status: None   Collection Time: 07/30/19  3:09 PM   Specimen: BLOOD  Result Value Ref Range Status   Specimen Description   Final    BLOOD LEFT ANTECUBITAL Performed at [facility] [address] [place], [place] [address]    Special Requests   Final    BOTTLES DRAWN AEROBIC ONLY Blood Culture adequate volume Performed at [facility] [address], [place], [place] [address]    Culture   Final    NO GROWTH 5 DAYS Performed at [facility], [place] [address], [place], [place] [address]    Report Status 08/04/2019 FINAL  Final  Culture, blood (routine x 2)     Status: None   Collection Time: 07/30/19  3:09 PM   Specimen: BLOOD  Result Value Ref Range Status   Specimen Description   Final    BLOOD BLOOD LEFT HAND Performed at [facility] [address] [place], [place] [address]    Special Requests   Final    BOTTLES DRAWN AEROBIC ONLY Blood Culture adequate volume   Culture   Final    NO GROWTH 5 DAYS Performed at [facility], [place] [address], [place], [place] [address]    Report Status 08/04/2019 FINAL  Final  MRSA PCR Screening     Status: None   Collection Time: 08/04/19  5:18 PM   Specimen: Nasal Mucosa; Nasopharyngeal  Result Value Ref Range Status   MRSA by PCR NEGATIVE NEGATIVE Final    Comment:        The GeneXpert MRSA Assay (FDA approved for NASAL specimens only), is one component of a comprehensive MRSA colonization surveillance program. It is not intended to diagnose MRSA infection nor to guide or monitor treatment for MRSA infections. Performed at [facility], [place] [address], [place], [place] [address]       Studies:  No results found.  Assessment: 74 y.o. Caucasian female, with past medical history of breast cancer and  parotid gland carcinoma  1. 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  AKI with hyponatremia and hypokalemia, improving  3.  History of stage I right breast cancer in 2019, on adjuvant anastrozole 4.  History of parotid gland carcinoma in 2020, status post surgery and radiation 5. HTN  6.  Deconditioning with significant LE weakness   Recommendations  -The patient was trialed on Kadcyla yesterday but blood pressure dropped about 10 minutes into the infusion and the treatment was stopped.  She was transferred to the stepdown unit for closer monitoring.  Blood pressure improved this morning. -Again discussed poor prognosis with the patient.  I note that she has agreed to DNR/DNI.  We agree with this decision. -Due to her poor performance status, she is not a treatment candidate for any additional chemotherapy and chemotherapy may shorten her life given her unstable condition and multiorgan dysfunction. -Palliative care consult has been placed and awaiting goals of care discussion.  Mikey Bussing  08/05/2019   Addendum  Pt's BP improved today, still very fatigued, laying in bed, not able to get out of bed. Husband was not at bedside when I saw her, her sons came in this morning and she was happy to see them.   We discussed discharge planning, including SNF, home hospice ( iam concerned her husband is not able to offer the care she needs) or residential hospice. Pt is not able to make decision at this point, but states she is not ready to give it up. Plan to discuss with her and her husband again tomorrow. Palliative care consult was placed, hopefully they can see her soon to help discuss the goal of care and transition to comfort care.   Truitt Merle  08/05/2019

## 2019-08-05 NOTE — Progress Notes (Signed)
Pt MEWS score changed to yellow due to change in Blood Pressure. TRH paged, awaiting interventions.

## 2019-08-05 NOTE — Progress Notes (Addendum)
PROGRESS NOTE    JAMANI ELEY  ZMO:[id number]  DOB: 1945-08-29  PCP: [name], MD 74 year old female with history for hypertension, dyslipidemia, breast/parotid cancer and GERD who was recently noted to have new liver lesions on surveillance CT chest presented with hypotension after a ultrasound-guided liver biopsy by IR on the day of admission.Patient reported about 7 days of skin discoloration, associated with nausea, vomiting, decreased appetite and generalized weakness. Her preoperative studies showed hyponatremia, hypokalemia, hyperbilirubinemia and elevated liver enzymes.  ED Course: Afebrile. After ultrasound-guided biopsy,her blood pressure was 66/36, with IV fluids improved to 101/55, heart rate 73, respiratory rate 16, temperature 97.9, oxygen saturation 99%.WBC count 8.8, hemoglobin 15.1, platelet 207. Sodium 129, potassium 2.6, chloride 87, CO2 28, BUN 35, creatinine 1.31, glucose 135. Total bilirubin 22.5, AST 1193, ALT 462. Patient was given 2 L NS bolus, Benadryl.  Hospital course: Patient admitted to [facility] for further evaluation and management,placed on intravenous fluids, close hemodynamic monitoring and [facility] GI consult placed. MRCP 5/4 with widespread metastatic disease to the liver. Dominant index lesion in the right hepatic lobe 4.0 x 3.0 cm. Intrahepatic biliary dilatation with central truncation of the biliary tree.Liver biopsy positive for metastatic breast cancer. ERCP on 5/7 revealed severe biliary stricturing, at the middle and upper third of the main bile duct, dilation of the right and left intrahepatic ducts. Now s/p biliary sphincterectomy/ sphincterectoplasty. Plastic biliary stents were placed at the right, left hepatic ducts and at the ventral pancreatic duct.Patient will need radiographic follow up as outpatient.MRI of T and L spine did not demonstrate a cause for weakness. Patient very weak and deconditioned, PT/OT recommendation for CIR--insurance  denied. HC complicated by hypotension on 5/19 requiring transfer to SDU, inability to tolerate further chemotherapy. Patient and family now leaning towards hospice and after d/w primary oncologist decided on DNR status as of 5/19.  Subjective:  Met with husband and sons outside the room and at bedside.  Blood pressure did improve with IV hydration overnight but family concerned that she has had abdominal distention with discomfort overnight.  Discussed with bedside nurse who stated patient had urinary retention requiring straight cath x2 draining 700 to 800 mL each time.  At this point patient and family leaning towards comfort measures.  Has orders to transfer out of SDU -awaiting bed availability on medical floor   Objective: Vitals:   08/05/19 0900 08/05/19 1000 08/05/19 1200 08/05/19 1617  BP: (!) 125/50 (!) 106/51  (!) 107/56  Pulse: 88 86    Resp: (!) 23 20    Temp:   97.8 F (36.6 C)   TempSrc:   Oral   SpO2: 98% 94%    Weight:      Height:        Intake/Output Summary (Last 24 hours) at 08/05/2019 1650 Last data filed at 08/05/2019 1100 Gross per 24 hour  Intake 260.59 ml  Output 2400 ml  Net -2139.41 ml   Filed Weights   07/31/19 0434 08/03/19 0441 08/04/19 1300  Weight: 94.4 kg 94.3 kg 94.3 kg    Physical Examination:  General exam: Appears concerned, but in no acute distress.  Improving icterus Respiratory system: Clear to auscultation. Respiratory effort normal. Cardiovascular system: S1 & S2 heard, RRR. Marland Kitchen  2+ pitting pedal edema. Gastrointestinal system: Abdomen is nondistended, soft on my exam and nontender. Normal bowel sounds heard. Central nervous system: Alert and oriented. No new focal neurological deficits. Extremities: Persistent lower extremity weakness with strength 3/5 in RLE and LLE,  able to wiggle toes and sensations to crude touch intact, 2+ pitting edema  Skin: No rashes, lesions or ulcers but appears jaundiced Psychiatry: Judgement and insight  appear normal. Mood & affect somewhat flat.   Data Reviewed: I have personally reviewed following labs and imaging studies  CBC: Recent Labs  Lab 07/30/19 0302 07/30/19 1509 08/01/19 0336 08/02/19 0249 08/04/19 0849  WBC 18.4* 20.7* 17.8* 17.1* 14.5*  NEUTROABS 16.1* 18.6* 15.9* 15.2*  --   HGB 10.3* 10.8* 9.8* 9.7* 9.7*  HCT 29.5* 31.5* 28.7* 28.5* 28.2*  MCV 91.3 93.2 93.8 94.1 94.6  PLT 189 214 220 219 027   Basic Metabolic Panel: Recent Labs  Lab 07/31/19 0305 07/31/19 0305 08/01/19 0336 08/02/19 0249 08/03/19 0517 08/04/19 0849 08/05/19 0939  NA 131*   < > 133* 130* 131* 132* 135  K 3.5   < > 4.0 3.7 4.6 4.0 4.0  CL 103   < > 102 100 101 100 103  CO2 18*   < > 21* 19* 17* 20* 18*  GLUCOSE 113*   < > 96 110* 123* 88 143*  BUN 89*   < > 99* 99* 90* 78* 111*  CREATININE 1.48*   < > 1.77* 1.66* 2.18* 2.01* 1.79*  CALCIUM 7.4*   < > 7.8* 7.8* 8.0* 7.8* 8.0*  MG 2.5*  --   --   --   --   --   --    < > = values in this interval not displayed.   GFR: Estimated Creatinine Clearance: 30.8 mL/min (A) (by C-G formula based on SCr of 1.79 mg/dL (H)). Liver Function Tests: Recent Labs  Lab 07/31/19 0305 08/01/19 0336 08/02/19 0249 08/04/19 0849 08/05/19 0939  AST 455* 414* 387* 246* 225*  ALT 246* 239* 232* 183* 171*  ALKPHOS 616* 627* 664* 580* 526*  BILITOT 11.1* 11.3* 11.3* 10.7* 13.1*  PROT 4.3* 4.6* 4.6* 4.7* 5.3*  ALBUMIN 1.3* 1.3* 1.3* 1.3* 1.9*   No results for input(s): LIPASE, AMYLASE in the last 168 hours. Recent Labs  Lab 08/01/19 0336  AMMONIA 40*   Coagulation Profile: Recent Labs  Lab 08/01/19 0336  INR 1.3*   Cardiac Enzymes: Recent Labs  Lab 07/31/19 0305 08/01/19 0336  CKTOTAL 1,487* 781*   BNP (last 3 results) No results for input(s): PROBNP in the last 8760 hours. HbA1C: No results for input(s): HGBA1C in the last 72 hours. CBG: No results for input(s): GLUCAP in the last 168 hours. Lipid Profile: No results for input(s):  CHOL, HDL, LDLCALC, TRIG, CHOLHDL, LDLDIRECT in the last 72 hours. Thyroid Function Tests: No results for input(s): TSH, T4TOTAL, FREET4, T3FREE, THYROIDAB in the last 72 hours. Anemia Panel: Recent Labs    08/03/19 1534  VITAMINB12 4,286*  FOLATE 9.0   Sepsis Labs: Recent Labs  Lab 07/30/19 0302 07/30/19 0733 07/31/19 0305 08/01/19 0336  LATICACIDVEN 2.0* 2.4* 2.3* 1.9    Recent Results (from the past 240 hour(s))  Culture, Urine     Status: None   Collection Time: 07/30/19  2:47 PM   Specimen: Urine, Clean Catch  Result Value Ref Range Status   Specimen Description   Final    URINE, CLEAN CATCH Performed at [facility], [place] [address] [place], [place] [address]    Special Requests   Final    NONE Performed at [facility], [place] [address] [place], [place] [address]    Culture   Final    NO GROWTH  Performed at [facility], [place] [address], [place], [place] [address]    Report Status 08/01/2019 FINAL  Final  Culture, blood (routine x 2)     Status: None   Collection Time: 07/30/19  3:09 PM   Specimen: BLOOD  Result Value Ref Range Status   Specimen Description   Final    BLOOD LEFT ANTECUBITAL Performed at [place] [address], [place], [place] [address]    Special Requests   Final    BOTTLES DRAWN AEROBIC ONLY Blood Culture adequate volume Performed at [place] [address] [place], [place] [address]    Culture   Final    NO GROWTH 5 DAYS Performed at [facility], [place] [address] [place], [place] [address]    Report Status 08/04/2019 FINAL  Final  Culture, blood (routine x 2)     Status: None   Collection Time: 07/30/19  3:09 PM   Specimen: BLOOD  Result Value Ref Range Status   Specimen Description   Final    BLOOD BLOOD LEFT HAND Performed at [place] [address], [place], [place] [address]    Special Requests   Final     BOTTLES DRAWN AEROBIC ONLY Blood Culture adequate volume   Culture   Final    NO GROWTH 5 DAYS Performed at [facility], [place] [address], [place], [place] [address]    Report Status 08/04/2019 FINAL  Final  MRSA PCR Screening     Status: None   Collection Time: 08/04/19  5:18 PM   Specimen: Nasal Mucosa; Nasopharyngeal  Result Value Ref Range Status   MRSA by PCR NEGATIVE NEGATIVE Final    Comment:        The GeneXpert MRSA Assay (FDA approved for NASAL specimens only), is one component of a comprehensive MRSA colonization surveillance program. It is not intended to diagnose MRSA infection nor to guide or monitor treatment for MRSA infections. Performed at [facility], [place] [address], [place], [place] [address]       Radiology Studies: No results found.      Scheduled Meds: . Chlorhexidine Gluconate Cloth  6 each Topical Daily  . enoxaparin (LOVENOX) injection  40 mg Subcutaneous Daily  . famotidine  20 mg Oral Daily  . feeding supplement  1 Container Oral BID BM  . hydrocortisone sod succinate (SOLU-CORTEF) inj  50 mg Intravenous Q8H  . midodrine  2.5 mg Oral TID WC  . senna-docusate  1 tablet Oral BID  . sodium bicarbonate  650 mg Oral TID   Continuous Infusions:    Assessment and plan:  1.  Hypotension: Initially improved with IV hydration.  She also received intermittent IV fluids during hospitalization for AKI but has had worsening leg edema in the setting of hypoalbuminemia/advanced malignancy.  She had not been on diuretics or IV fluids most of the week but received IV bolus yesterday as well as continuous fluids overnight in view of hypotension.  Also added IV albumin.  Home diuretics/ACE inhibitors have been held in concern for AKI since admission.  She did undergo echocardiogram on 5/18 which shows preserved EF. Her oral intake has been poor. Cortisol level normal, adrenal insufficiency ruled out.  Oncology attempted chemo 5/19  after IV fluids but blood pressure did not tolerate and had to abort.Consulted PCCM for assistance with central line/hypotension Mx but deferred due to changing care goals per discussion with family  members.  At this point husband does not want any aggressive interventions like central line/pressors/resuscitation/invasive surgeries.  They are leaning towards comfort care and awaiting further discussions with palliative care team.  They are okay with discontinuing cardiac monitor and moving out of medical floor.  2. AKI with Non anionic gap metabolic acidosis. AKI could be related to initial hypotension/ATN versus prerenal in the setting of dehydration/diuretic use.  Home medications HCTZ and Telmisartan been on hold.  She did have urinary retention 5/9 and Foley catheter placed.  However noted to have continued worseningof renal function with BUN up to 90s and serum Cr up to2.3 with acidosis prompting nephrology consultation--patient started on bicarb supplements..ACEI/ARB/diuretics held, contrasted studies avoided and patient received intermittent IV hydration with which her renal function did improve transiently to creatinine 1.4.-->but up at 2.0 again. Uremia has persisted and could be contributing to overall weakness.      3. 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 Received Zosyn for total of 5 days post procedure.Liver enzymes and bilirubins slowly downtrending.  Albumin at 1.8 and INR 1.6. Now tolerating regular diet.  GI last week recommended repeat labs/KUB in 2 weeks for pancreatic stent follow-up and repeat ERCP in 3 months, currently care goals being revisited.  4. Bilateral lower extremity weakness: Slowly improving.   Myopathy versus neuropathy versus somatization.  MRI thoracic and lumbar spine negative for spinal mets or significant findings to suggest canal stenosis.  Patient was evaluated by neurology, Dr. Leonel Ramsay who felt presentation likely due to neuropraxia versus myopathy.  CK was elevated at 1487->improved to 780 on repeat.  Lower extremity weakness slowly improving, has worsening edema though.  Insurance declined CIR and was awaiting SNF rehab.  Now care goals being revisited.  4. Hx of Parotid/Breast cancer, nowwith diffuse liver metastasis.Seen by Dr. Burr Medico from oncologywho is obtained baseline echo and plans on inpatient treatment with "TDM 1 ([place]), which is a target therapy of drug conjugate of trastuzumab and chemo emtansine, more tolerable with less side effects".. She has discontinued anastrazole. 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 Lanell Persons referred her for a guided biopsy of the liver lesions after CT chest for interval screening of parotid cancer resulted abnormal.Chemo per oncology as discussed above  5. Hyponatremia/Hypokalemia:  Etiology likely secondary to hypovolemic hyponatremia in the setting of poor oral intake and decreased appetite over the past week/month. Sodium 129 and potassium 2.6  on admission-improved with IVF and electrolyte replacement respectively.  Sodium however appears to be fluctuating and low again on labs today.  Patient off IV fluids and appears somewhat hypervolemic with leg edema.  Will hold off on diuretics/IV fluids in concern for AKI as well as third spacing in the setting of hypoalbuminemia.   6. HTN/ dyslipidemia. Home medsTelemisartan and hctz/ statin therapy on hold since admission.Blood pressure improved since admission, low normal without meds.  7.   Urinary retention: May place indwelling Foley catheter if she has recurrence.  Needed straight cath x2.  Discussed with bedside nurse.  8. Severe malnutrition, failure to  thrive: in the setting of advanced malignancy. Continue protein supplements, Boost.  Palliative care consulted for discussion of care goals given patient's progressive decline over the last 2 weeks.   DVT prophylaxis: Lovenox Code  Status: Full code Family / Patient Communication:Discussed with husband, both sons at bedside.  Discussed with palliative care MD  Disposition Plan: Transfer out of stepdown unit. Status is: Inpatient  Remains inpatient appropriate because:Inpatient level of care appropriate due to severity of illness, persistent lower extremity weakness, pending alternate level of care availability (CIR)   Dispo: The patient is from: Home              Anticipated d/c is to: SNF rehab-with palliative care versus hospice              Anticipated d/c date is: 2 days              Patient currently medically stable to d/c--awaiting goals of care delineation and appropriate alternate disposition bed.       Time spent: 35 minutes.     Guilford Shi, MD [facility] Pager in [place]  If 7PM-7AM, please contact night-coverage [email or web address] 08/05/2019, 4:50 PM

## 2019-08-05 NOTE — TOC Initial Note (Signed)
Transition of Care [facility]) - Initial/Assessment Note    Patient Details  Name: [name] MRN: [id number] Date of Birth: [birth year]  [facility] CM/SW Contact:    Leeroy Cha, RN Phone Number: 08/05/2019, 2:26 PM  Clinical Narrative:                 Leslie number [id number] [phone]  Expected Discharge Plan: [place] Barriers to Discharge: Continued Medical Work up   Patient Goals and CMS Choice Patient states their goals for this hospitalization and ongoing recovery are:: Hopes to be able to get into CIR CMS Medicare.gov Compare Post Acute Care list provided to:: Patient Choice offered to / list presented to : Patient  Expected Discharge Plan and Services Expected Discharge Plan: [place] In-house Referral: Clinical Social Work Discharge Planning Services: CM Consult   Living arrangements for the past 2 months: [place]                                      Prior Living Arrangements/Services Living arrangements for the past 2 months: Single Family Home Lives with:: Spouse Patient language and need for interpreter reviewed:: Yes Do you feel safe going back to the place where you live?: Yes(after completion of rehab)      Need for Family Participation in Patient Care: Yes (Comment) Care giver support system in place?: Yes (comment)   Criminal Activity/Legal Involvement Pertinent to Current Situation/Hospitalization: No - Comment as needed  Activities of Daily Living Home Assistive Devices/Equipment: Eyeglasses(reading glasses) ADL Screening (condition at time of admission) Patient's cognitive ability adequate to safely complete daily activities?: Yes Is the patient deaf or have difficulty hearing?: No Does the patient have difficulty seeing, even when wearing glasses/contacts?: No Does the patient have difficulty concentrating, remembering, or making decisions?: No Patient able to  express need for assistance with ADLs?: Yes Does the patient have difficulty dressing or bathing?: No Independently performs ADLs?: Yes (appropriate for developmental age) Does the patient have difficulty walking or climbing stairs?: Yes Weakness of Legs: Both Weakness of Arms/Hands: Both  Permission Sought/Granted Permission sought to share information with : Facility Art therapist granted to share information with : Yes, Verbal Permission Granted  Share Information with NAME: Yee Poorman     Permission granted to share info w Relationship: spouse  Permission granted to share info w Contact Information: [phone]  Emotional Assessment Appearance:: Appears stated age Attitude/Demeanor/Rapport: Engaged, Gracious Affect (typically observed): Stable, Pleasant Orientation: : Oriented to Self, Oriented to Place, Oriented to  Time, Oriented to Situation Alcohol / Substance Use: Not Applicable Psych Involvement: No (comment)  Admission diagnosis:  Hyperbilirubinemia [E80.6] Bilirubinemia [E80.6] Liver mass [R16.0] Obstructive jaundice [K83.1] Patient Active Problem List   Diagnosis Date Noted  . 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 Nodule on liver 07/20/2019  . Hypokalemia 07/20/2019  . Hyponatremia 07/20/2019  . Transaminitis 07/20/2019  . Hypotension 07/20/2019  . Elevated bilirubin 07/20/2019  . 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 Parotid mass 11/28/2017  . Malignant neoplasm of lower-inner quadrant of right breast of female, estrogen receptor positive ([place]) 07/22/2017  . Appendicitis with perforation 03/27/2017  . OA (osteoarthritis) of hip 11/29/2015   PCP:  [name], MD Pharmacy:   [facility] [place], [place] - [place]  [address] [place] [address] Phone: [phone] Fax: [phone]     Social  Determinants of Health (SDOH) Interventions    Readmission Risk Interventions No flowsheet data found.

## 2019-08-05 NOTE — Progress Notes (Signed)
[facility] PCCM  I met briefly with Mr and Mrs. Munday this morning.  Overnight she met with her oncology team after she was transferred to the ICU in the midst of hypotension.  Her blood pressure has improved, her mental status is fine.  Last night it was decided that given her poor prognosis she should not receive any aggressive ICU level treatments or invasive procedures.  I completely agree with this.  Her blood pressure is normal today.  Will write an order to transfer her out of the ICU.  Roselie Awkward, MD [place] P[facility]M Pager: [phone] Cell: [phone] If no response, call [phone]

## 2019-08-05 NOTE — Consult Note (Signed)
Palliative care consult  Reason for consult: Goals of care in light of metastatic breast cancer with continued functional decline  Palliative care consult received.  Chart reviewed including personal review of pertinent labs and imaging.  Discussed case with Dr. Earnest Conroy.  Briefly, [name] is a 74 year old female with past medical history of hypertension, dyslipidemia, parotid/breast cancer and GERD who was noted to have new liver lesions on CT and presented following hypertension at time of ultrasound-guided liver biopsy.  She has had a complicated hospital course with hypotension and MRCP on 5/4 that revealed widespread metastatic disease of the liver.  ERCP on 5/7 showed severe stricturing with stents placed.  She has had significant lower extremity weakness thought to be more related to deconditioning and swelling and other neurologic abnormality.  She was planned to have treatment with Kadcyla but developed hypotension 10 minutes into infusion and it was stopped that she was transferred to stepdown unit.  Her blood pressure does appear to be improved today.  I met today with patient, her husband ([name]), and her oldest son [name]).  She also has a younger son who left to pick up his children from school.  I introduced palliative care as specialized medical care for people living with serious illness. It focuses on providing relief from the symptoms and stress of a serious illness. The goal is to improve quality of life for both the patient and the family.   We discussed difference between a aggressive medical intervention path and a palliative, comfort focused care path.  Values and goals of care important to patient and family were attempted to be elicited.  We discussed clinical course as well as wishes moving forward in light of the fact that she is not a good candidate for further systemic therapy.  We discussed that the fact that she is not a candidate for further disease modifying therapy  does not mean that there is not care left to give, however, it means that we need to focus care on aggressive symptom management and the best supportive care possible in order to improve her quality of life is much as possible.  She and family are very realistic and open to frank discussion about options for care moving forward.  Her son states that while they wish that they could care for her at home, he is concerned about the reality of family being able to care for [name] in her current debilitated state.  [name] recently had shoulder surgery and has been working to rehab his shoulder for the past year.  [name] does not live locally and is able to come to visit and help, but not around-the-clock as he lives in the [place] area.  Based upon this, much of her discussion today was about reality of care plan and logistics for care moving forward.  Family requested to discuss all options for care moving forward.  We discussed what I believed to be all realistic options for discharge from the hospital including: -Transition to skilled facility for trial of rehab (recommend palliative care to follow if chooses this option as I am concerned about how well she would be able to tolerate rehab).  -Transition to home with home health (this is not realistic option as family not able to meet her care needs at home) -Transition to residential hospice for end-of-life care (discussed prognosis usually being less than 2 weeks in order to be approved for this level of service) -Transition to long-term care facility with hospice support (discussed  that this would include cost associated with room board fee as this is not something covered by Medicare benefit) -Transition home with hospice support (discussed concerned that there is not 24-hour in-home care through hospice and family has expressed concern about their ability to meet her care needs at home even with the support of hospice)  Family was very appreciative of  time and information provided.  They state they need time to consider options and would like to have more input from hospice liaison prior to making decision regarding care plan moving forward.  We have set a time for a follow-up meeting tomorrow at 10 AM.  Recommendations: -DNR/DNI -[name] and her family are considering options moving forward.  They would like time to discuss as a family and see how she is feeling tomorrow.  She states that yesterday she would have chosen differently than she would today based upon the fact she is feeling better today than yesterday.  They would like to speak further with hospice liaison regarding options for hospice either now or at some point in the future.  She states that ideally she would like to work to get stronger to be able to transition home for at least a period of time.  We discussed concerns regarding potential trial of rehab and need to closely follow progress if she chooses this route to see if she is making gains towards this goal of being back to her own home. -Plan for follow-up meeting tomorrow morning with patient, her husband, and 2 sons at 89 AM.  I will also call and see if someone from [place] is able to meet with family or join [place] for meeting tomorrow.  Her husband specifically requested further information regarding [employer] moving forward.  Start time: 1430 End time: 1550 Total time: 80 minutes  Greater than 50%  of this time was spent counseling and coordinating care related to the above assessment and plan.  Christina Rough, MD [place] Team [phone]

## 2019-08-06 DIAGNOSIS — C787 Secondary malignant neoplasm of liver and intrahepatic bile duct: Secondary | ICD-10-CM

## 2019-08-06 DIAGNOSIS — R180 Malignant ascites: Secondary | ICD-10-CM

## 2019-08-06 DIAGNOSIS — C50911 Malignant neoplasm of unspecified site of right female breast: Secondary | ICD-10-CM

## 2019-08-06 DIAGNOSIS — G729 Myopathy, unspecified: Secondary | ICD-10-CM

## 2019-08-06 MED ORDER — OXYCODONE HCL 5 MG PO TABS: 5.0000 mg | ORAL_TABLET | ORAL | 0 refills | Status: AC | PRN

## 2019-08-06 MED ORDER — MIDODRINE HCL 2.5 MG PO TABS: 2.5000 mg | ORAL_TABLET | Freq: Three times a day (TID) | ORAL | Status: AC

## 2019-08-06 MED ORDER — SENNOSIDES-DOCUSATE SODIUM 8.6-50 MG PO TABS: 1.0000 | ORAL_TABLET | Freq: Every evening | ORAL | Status: AC | PRN

## 2019-08-06 MED ORDER — SENNOSIDES-DOCUSATE SODIUM 8.6-50 MG PO TABS: 1.0000 | ORAL_TABLET | Freq: Two times a day (BID) | ORAL | Status: AC

## 2019-08-06 MED ORDER — ONDANSETRON HCL 4 MG PO TABS: 4.0000 mg | ORAL_TABLET | Freq: Four times a day (QID) | ORAL | 0 refills | Status: AC | PRN

## 2019-08-06 MED ORDER — SIMETHICONE 80 MG PO CHEW: 80.0000 mg | CHEWABLE_TABLET | Freq: Four times a day (QID) | ORAL | 0 refills | Status: AC | PRN

## 2019-08-06 NOTE — Progress Notes (Signed)
PT Cancellation Note  Patient Details Name: [name] MRN: [id number] DOB: Dec 17, 1945   Cancelled Treatment:    Reason Eval/Treat Not Completed: Other (comment)(Patient/family met with Palliative Medicine and feel she will be best served by transition to residential hospice or further care. Pt will discahrge to [facility]. Will discontinue therapy at this time.)  Verner Mould, DPT Physical Therapist with [facility] [phone]  08/06/2019 1:09 PM

## 2019-08-06 NOTE — Progress Notes (Addendum)
[name]   DOB:July 04, 1945   UJ#:[id number]   NFA#:[id number]  Oncology follow up   Subjective: Still remains fatigued.  She has no other specific complaints today.  The patient and her family met with the palliative care team earlier today and would like to [facility] if [facility] is [facility].  Objective:  Vitals:   08/06/19 0105 08/06/19 0559  BP: (!) 100/56 124/63  Pulse: 74 77  Resp:  16  Temp: 98.3 F (36.8 C) 97.8 F (36.6 C)  SpO2: 93% 95%    Body mass index is 34.6 kg/m.  Intake/Output Summary (Last 24 hours) at 08/06/2019 1246 Last data filed at 08/06/2019 0550 Gross per 24 hour  Intake 668.33 ml  Output 800 ml  Net -131.67 ml     Resting comfortably  (+) jaundice    No peripheral adenopathy  Lungs clear -- no rales or rhonchi  Heart regular rate and rhythm  Abdomen soft nontender   MSK no focal spinal tenderness, legs with anasarca  Neuro nonfocal    CBG (last 3)  No results for input(s): GLUCAP in the last 72 hours.   Labs:  Urine Studies No results for input(s): UHGB, CRYS in the last 72 hours.  Invalid input(s): UACOL, UAPR, USPG, UPH, UTP, UGL, UKET, UBIL, UNIT, UROB, ULEU, UEPI, UWBC, URBC, UBAC, CAST, [place], [place]  Basic Metabolic Panel: Recent Labs  Lab 07/31/19 0305 07/31/19 0305 08/01/19 0336 08/01/19 0336 08/02/19 0249 08/02/19 0249 08/03/19 0517 08/03/19 0517 08/04/19 0849 08/05/19 0939  NA 131*   < > 133*  --  130*  --  131*  --  132* 135  K 3.5   < > 4.0   < > 3.7   < > 4.6   < > 4.0 4.0  CL 103   < > 102  --  100  --  101  --  100 103  CO2 18*   < > 21*  --  19*  --  17*  --  20* 18*  GLUCOSE 113*   < > 96  --  110*  --  123*  --  88 143*  BUN 89*   < > 99*  --  99*  --  90*  --  78* 111*  CREATININE 1.48*   < > 1.77*  --  1.66*  --  2.18*  --  2.01* 1.79*  CALCIUM 7.4*   < > 7.8*  --  7.8*  --  8.0*  --  7.8* 8.0*  MG 2.5*  --   --   --   --   --   --   --   --   --    < > = values in this interval not displayed.    GFR Estimated Creatinine Clearance: 30.8 mL/min (A) (by C-G formula based on SCr of 1.79 mg/dL (H)). Liver Function Tests: Recent Labs  Lab 07/31/19 0305 08/01/19 0336 08/02/19 0249 08/04/19 0849 08/05/19 0939  AST 455* 414* 387* 246* 225*  ALT 246* 239* 232* 183* 171*  ALKPHOS 616* 627* 664* 580* 526*  BILITOT 11.1* 11.3* 11.3* 10.7* 13.1*  PROT 4.3* 4.6* 4.6* 4.7* 5.3*  ALBUMIN 1.3* 1.3* 1.3* 1.3* 1.9*   No results for input(s): LIPASE, AMYLASE in the last 168 hours. Recent Labs  Lab 08/01/19 0336  AMMONIA 40*   Coagulation profile Recent Labs  Lab 08/01/19 0336  INR 1.3*    CBC: Recent Labs  Lab 07/30/19 1509 08/01/19 0336 08/02/19  0249 08/04/19 0849  WBC 20.7* 17.8* 17.1* 14.5*  NEUTROABS 18.6* 15.9* 15.2*  --   HGB 10.8* 9.8* 9.7* 9.7*  HCT 31.5* 28.7* 28.5* 28.2*  MCV 93.2 93.8 94.1 94.6  PLT 214 220 219 248   Cardiac Enzymes: Recent Labs  Lab 07/31/19 0305 08/01/19 0336  CKTOTAL 1,487* 781*   BNP: Invalid input(s): POCBNP CBG: No results for input(s): GLUCAP in the last 168 hours. D-Dimer No results for input(s): DDIMER in the last 72 hours. Hgb A1c No results for input(s): HGBA1C in the last 72 hours. Lipid Profile No results for input(s): CHOL, HDL, LDLCALC, TRIG, CHOLHDL, LDLDIRECT in the last 72 hours. Thyroid function studies No results for input(s): TSH, T4TOTAL, T3FREE, THYROIDAB in the last 72 hours.  Invalid input(s): FREET3 Anemia work up Recent Labs    08/03/19 [place] 9.0   Microbiology Recent Results (from the past 240 hour(s))  Culture, Urine     Status: None   Collection Time: 07/30/19  2:47 PM   Specimen: Urine, Clean Catch  Result Value Ref Range Status   Specimen Description   Final    URINE, CLEAN CATCH Performed at [facility] [address] [place], [place] [address]    Special Requests   Final    NONE Performed at [facility], [place]  [address], [place], [place] [address]    Culture   Final    NO GROWTH Performed at [facility], [place] [address] [place], [place] [address]    Report Status 08/01/2019 FINAL  Final  Culture, blood (routine x 2)     Status: None   Collection Time: 07/30/19  3:09 PM   Specimen: BLOOD  Result Value Ref Range Status   Specimen Description   Final    BLOOD LEFT ANTECUBITAL Performed at [facility] [address] [place], [place] [address]    Special Requests   Final    BOTTLES DRAWN AEROBIC ONLY Blood Culture adequate volume Performed at [facility] [address], [place], [place] [address]    Culture   Final    NO GROWTH 5 DAYS Performed at [facility], [place] [address] [place], [place] [address]    Report Status 08/04/2019 FINAL  Final  Culture, blood (routine x 2)     Status: None   Collection Time: 07/30/19  3:09 PM   Specimen: BLOOD  Result Value Ref Range Status   Specimen Description   Final    BLOOD BLOOD LEFT HAND Performed at [facility] [address] [place], [place] [address]    Special Requests   Final    BOTTLES DRAWN AEROBIC ONLY Blood Culture adequate volume   Culture   Final    NO GROWTH 5 DAYS Performed at [facility], [place] [address], [place], [place] [address]    Report Status 08/04/2019 FINAL  Final  MRSA PCR Screening     Status: None   Collection Time: 08/04/19  5:18 PM   Specimen: Nasal Mucosa; Nasopharyngeal  Result Value Ref Range Status   MRSA by PCR NEGATIVE NEGATIVE Final    Comment:        The GeneXpert MRSA Assay (FDA approved for NASAL specimens only), is one component of a comprehensive MRSA colonization surveillance program. It is not intended to diagnose MRSA infection nor to guide or monitor treatment for MRSA infections. Performed at [facility], [place] [address], [place], [place] [address]       Studies:  No results  found.  Assessment: 74 y.o. Caucasian female, with past medical history of breast cancer and parotid gland carcinoma  1. 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  AKI with hyponatremia and hypokalemia, improving  3.  History of stage I right breast cancer in 2019, on adjuvant anastrozole 4.  History of parotid gland carcinoma in 2020, status post surgery and radiation 5. HTN  6.  Deconditioning with significant LE weakness   Recommendations  -The patient was trialed on Kadcyla but blood pressure dropped about 10 minutes into the infusion and the treatment was stopped.  She was transferred to the stepdown unit for closer monitoring.  Blood pressure improved and she is now back on the oncology unit. -Due to her poor performance status, she is not a treatment candidate for any additional chemotherapy and chemotherapy may shorten her life given her unstable condition and multiorgan dysfunction. -Appreciate assistance of the palliative care team.  They have met with the patient and her family this morning.  [facility] would [facility].  Case management and hospice liaison are currently working on this.  Mikey Bussing  08/06/2019   Addendum  I have seen the patient, examined her. I agree with the assessment and and plan and have edited the notes.   I met pt, her husband Richardson Landry, and 2 sons in her room around noon time. We again reviewed her hospital course, diagnosis, staging, and very poor prognosis. All questions were answered. Patient and her family are all in agreement with residential hospice, and is there comfortable with the decision. I strongly support their decision, and they are appreciative. [facility] has accept her, and she will be transferred there soon.   Truitt Merle  08/06/2019

## 2019-08-06 NOTE — TOC Transition Note (Signed)
Transition of Care [facility]) - CM/SW Discharge Note   Patient Details  Name: [name] MRN: [id number] Date of Birth: [birth year]  Transition of Care [facility]) CM/SW Contact:  Lynnell Catalan, RN Phone Number: 08/06/2019, 2:24 PM   Clinical Narrative:     Pt to dc to [facility] today. Yellow DNR on chart.  PTAR contacted for transport. RN to call [phone] for report.  Final next level of care: [place] Barriers to Discharge: No Barriers Identified   Discharge Plan and Services In-house Referral: Clinical Social Work Discharge Planning Services: CM Consult                 Readmission Risk Interventions Readmission Risk Prevention Plan 08/06/2019  Transportation Screening Complete  PCP or Specialist Appt within 3-5 Days Complete  HRI or Home Care Consult Complete  Social Work Consult for [place] Planning/Counseling Complete  Palliative Care Screening Complete  Medication Review [employer]) Complete  Some recent data might be hidden

## 2019-08-06 NOTE — Progress Notes (Signed)
Daily Progress Note   Patient Name: [name]       Date: 08/06/2019 DOB: 12-22-1945  Age: 74 y.o. MRN#: [id number] Attending Physician: Guilford Shi, MD Primary Care Physician: [name], MD Admit Date: 07/20/2019  Reason for Consultation/Follow-up: Establishing goals of care  Subjective: I met today with Ms. Zeleznik, her husband, and her 2 sons.    We reviewed clinical course overnight.  She reports feeling better today, however, she did have decreased pressure at times overnight.  We reviewed again options moving forward for her care.  She states that she wishes she had more strength and that she could get stronger, however, she realizes that this is not likely to be the case moving forward.  She reports most important things being comfortable and spending time with her family.  We spent some more time discussing potential options and after further discussion , family believes that she would be best served by hospice moving forward.  We talked about options for hospice care including residential hospice versus long-term care with hospice support versus home hospice.  While patient would like to be at home, it is not a situation where it is realistic that her husband will be able to care for her as he has his own medical problems including recent shoulder injury.  Her sons are both very supportive but are not in a position to move in to be able to provide 24-hour in-home care.  Her husband states that he has had very good things about [employer] and that he feels that this would likely be the best option for her care moving forward.  They [facility] for evaluation for potential placement at residential hospice for end-of-life care.  Length of Stay:  17  Current Medications: Scheduled Meds:  . Chlorhexidine Gluconate Cloth  6 each Topical Daily  . enoxaparin (LOVENOX) injection  40 mg Subcutaneous Daily  . famotidine  20 mg Oral Daily  . feeding supplement  1 Container Oral BID BM  . midodrine  2.5 mg Oral TID WC  . senna-docusate  1 tablet Oral BID  . sodium bicarbonate  650 mg Oral TID    Continuous Infusions:   PRN Meds: ondansetron **OR** ondansetron (ZOFRAN) IV, oxyCODONE, senna-docusate, simethicone  Physical Exam     General: Alert, awake,  in no acute distress.  Heart: Regular rate and rhythm. No murmur appreciated. Lungs: Good air movement Abdomen: Soft, nontender, nondistended, positive bowel sounds.  Ext: Anasarca Skin: Warm and dry, some jaundice Neuro: Grossly intact, nonfocal.  Vital Signs: BP 124/63 (BP Location: Left Arm)   Pulse 77   Temp 97.8 F (36.6 C) (Oral)   Resp 16   Ht _0  (1.651 m)   Wt 94.3 kg   SpO2 95%   BMI 34.60 kg/m  SpO2: SpO2: 95 % O2 Device: O2 Device: Room Air O2 Flow Rate: O2 Flow Rate (L/min): 0 L/min  Intake/output summary:   Intake/Output Summary (Last 24 hours) at 08/06/2019 1106 Last data filed at 08/06/2019 0550 Gross per 24 hour  Intake 668.33 ml  Output 800 ml  Net -131.67 ml   LBM: Last BM Date: 08/05/19 Baseline Weight: Weight: 77.1 kg Most recent weight: Weight: 94.3 kg       Palliative Assessment/Data:      Patient Active Problem List   Diagnosis Date Noted  . 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 Nodule on liver 07/20/2019  . Hypokalemia 07/20/2019  . Hyponatremia 07/20/2019  . Transaminitis 07/20/2019  . Hypotension 07/20/2019  . Elevated bilirubin 07/20/2019  . Cancer of parotid gland ([place]) 07/24/2018  . 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 Parotid mass 11/28/2017  . Malignant neoplasm of lower-inner quadrant of right breast of female, estrogen receptor positive ([place]) 07/22/2017  . Appendicitis with perforation 03/27/2017  . OA  (osteoarthritis) of hip 11/29/2015    Palliative Care Assessment & Plan   Patient Profile: 74 year old female with past medical history of hypertension, dyslipidemia, parotid/breast cancer and GERD who was noted to have new liver lesions on CT and presented following hypertension at time of ultrasound-guided liver biopsy.  She has had a complicated hospital course with hypotension and MRCP on 5/4 that revealed widespread metastatic disease of the liver.  ERCP on 5/7 showed severe stricturing with stents placed.  She has had significant lower extremity weakness thought to be more related to deconditioning and swelling and other neurologic abnormality.  She was planned to have treatment with Kadcyla but developed hypotension 10 minutes into infusion and it was stopped that she was transferred to stepdown unit.  Her blood pressure still runs low at times despite midodrine but she is stable enough to have transferred back to the floor.  Recommendations/Plan: DNR/DNI I met today with patient and her family.  We discussed options for care moving forward and she and family believe that she would best be served by transition to residential hospice for end of life care if it can be arranged.  I called and discussed with hospice liaison to request evaluation.  Code Status:    Code Status Orders  (From admission, onward)         Start     Ordered   08/04/19 1844  Do not attempt resuscitation (DNR)  Continuous    Question Answer Comment  In the event of cardiac or respiratory ARREST Do not call a "code blue"   In the event of cardiac or respiratory ARREST Do not perform Intubation, CPR, defibrillation or ACLS   In the event of cardiac or respiratory ARREST Use medication by any route, position, wound care, and other measures to relive pain and suffering. May use oxygen, suction and manual treatment of airway obstruction as needed for comfort.      08/04/19 1843         Prognosis:  Weeks?  Ms.  Pellegrino pressures have improved from her acute decline on 5/19, but, even with midodrine, still with documented pressure overnight as low as 90/46.  Her functional status is significantly declined as she is now bedbound but was ambulating prior to this admission.    Discharge Planning: To Be Determined- ? [place] was discussed with patient, husband, sons  Thank you for allowing the Palliative Medicine Team to assist in the care of this patient.   Time In: 1000 Time Out: 1040 Total Time 40 Prolonged Time Billed No      Greater than 50%  of this time was spent counseling and coordinating care related to the above assessment and plan.  [name], MD  Please contact Palliative Medicine Team phone at [phone] for questions and concerns.

## 2019-08-06 NOTE — Progress Notes (Signed)
Report called to [facility] at [facility]. Awaiting PTAR.

## 2019-08-06 NOTE — Progress Notes (Signed)
[facility] Liaison Note.  Request for [facility] from Dr. Domingo Cocking at family request.  Met with family and patient at bedside to explain services and answer questions. Patient and family in agreement for transfer to [facility].    Liaison will notify TOC when consents are to complete to arrange transport. RN please call report to [phone].  Please call with any hospice related questions.  Thank you, Farrel Gordon, RN, [facility] (listed on AMION under hospice) [phone]

## 2019-08-06 NOTE — Discharge Summary (Addendum)
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, ER/PR negative, HER-2 positive Active Problems:   Breast cancer metastasized to liver, right ([place])   Cancer of parotid gland ([place])   Nodule on liver   Hypokalemia   Hyponatremia   Transaminitis   Hypotension   Elevated bilirubin   Myopathy   Hospital Course Summary:   74 year old female with history for hypertension, dyslipidemia, breast/parotid cancer and GERD who was recently noted to have new liver lesions on surveillance CT chest presented with hypotension after a ultrasound-guided liver biopsy by IR on the day of admission.Patient reported about 7 days of skin discoloration, associated with nausea, vomiting, decreased appetite and generalized weakness. Her preoperative studies showed hyponatremia, hypokalemia, hyperbilirubinemia and elevated liver enzymes.  ED Course: Afebrile. After ultrasound-guided biopsy,her blood pressure was 66/36, with IV fluids improved to 101/55, heart rate 73, respiratory rate 16, temperature 97.9, oxygen saturation 99%.WBC count 8.8, hemoglobin 15.1, platelet 207. Sodium 129, potassium 2.6, chloride 87, CO2 28, BUN 35, creatinine 1.31, glucose 135. Total bilirubin 22.5, AST 1193, ALT 462. Patient was given 2 L NS bolus, Benadryl.  Hospital course: Patient admitted to [facility] for further evaluation and management,placed on intravenous fluids, close hemodynamic monitoring and [facility] GI consult placed. MRCP 5/4 with widespread metastatic disease to the liver. Dominant index lesion in the right hepatic lobe 4.0 x 3.0  cm. Intrahepatic biliary dilatation with central truncation of the biliary tree.Liver biopsy positive for metastatic breast cancer. ERCP on 5/7 revealed severe biliary stricturing, at the middle and upper third of the main bile duct, dilation of the right and left intrahepatic ducts. Now s/p biliary sphincterectomy/ sphincterectoplasty. Plastic biliary stents were placed at the right, left hepatic ducts and at the ventral pancreatic duct.Patient will need radiographic follow up as outpatient.MRI of T and L spine did not demonstrate a cause for weakness. Patient very weak and deconditioned, PT/OT recommendation for CIR--insurance denied. HC complicated by hypotension on 5/19 requiring transfer to SDU, inability to tolerate further chemotherapy. Patient and family decided to avoid aggressive interventions, refused central line placement/pressors and chose DNR/ comfort goals/ hospice after d/w primary oncologist as well as palliative care, Dr Domingo Cocking. Patient accepted to [facility] and has a bed available for today. BP stable currently at systolic 893 to 810F, saturating well on RA and has indwelling foley catheter for urinary retention.   1. Obstructive jaundice due to malignant stricture involving the confluence of the right and left hepatic ducts: SP ERCP on 5/7 with sphincterectomy and sphincteroplasty, stents at the right and left hepatic ducts, stent at the ventral pancreatic duct.Patient felt to be high risk for cholangitis on admission.ReceivedZosynfortotal of 5 days post procedure.Liver enzymes and bilirubins slowly downtrended. Albumin at 1.8 and INR 1.6. Now tolerating regular diet.  GI last week recommended repeat labs/KUB in 2 weeks for pancreatic stent follow-up and repeat ERCP in 3 months, currently care goals  Revisited and she chose hospice. F/U GI PRN   2. AKI with Non anionic gap metabolic acidosis. AKI could be related to initial hypotension/ATN versus prerenal in the setting of  dehydration/diuretic use.  Home medications HCTZ and Telmisartan been on hold. She did have  urinary retention 5/9 and Foley catheter placed. However noted to have continued worseningof renal function with BUN up to90s and serum Cr up to2.3with acidosis prompting nephrology consultation--patient started on bicarb supplements..ACEI/ARB/diuretics held, contrasted studies avoided and patient received intermittent IV hydration with which her renal function did improve transiently to creatinine 1.4.-->but up at 2.0 again. Uremia has persisted and could be contributing to overall weakness.     3. Hypotension: Initially improved with IV hydration.  She also received intermittent IV fluids during hospitalization for AKI but has had worsening leg edema in the setting of hypoalbuminemia/advanced malignancy.  She had not been on diuretics or IV fluids most of the week but received IV bolus 5/19  Along with IV albumin/ stress dose steroids and midodrine. Home diuretics/ACE inhibitors have been held in concern for AKI since admission.  She did undergo echocardiogram on 5/18 which shows preserved EF. Cortisol level normal, adrenal insufficiency ruled out. Discontinued steroids. Oncology attempted chemo 5/19 after IV fluids but blood pressure did not tolerate and had to abort.Consulted PCCM for assistance with central line/hypotension Mx but deferred due to changing care goals per discussion with family members. She remains on midodrine and SBP 110s, mentating okay. Will defer to palliative care MD regarding further continuation/disconntinuation of this medication.   4. Bilateral lower extremity weakness: Slowly improving.  Myopathy versus neuropathy versus somatization.  MRI thoracic and lumbar spine negative for spinal mets or significant findings to suggest canal stenosis.  Patient was evaluated by neurology, Dr. Leonel Ramsay who felt presentation likely due to neuropraxia versus myopathy.  CK was elevated at  1487->improved to 780 on repeat.  Lower extremity weakness slowly improving, has worsening edema though.  Insurance declined CIR and was awaiting SNF rehab.  Now care goals changed to hospice and PT signed off.  4.Hx of Parotid/Breast cancer, nowwith diffuse liver metastasis.Seen by Dr. Burr Medico from oncologywho is obtained baseline echo and plans on inpatient treatment with "TDM 1 ([place]),which is a target therapy of drug conjugate of trastuzumab and chemoemtansine,more tolerable with less side effects".. She has discontinued anastrazole as metastatic disease is ER/PR -ve. 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 Lanell Persons referred her for a guided biopsy of the liver lesions after CT chest for interval screening of parotid cancer resulted abnormal. No further chemo given change in care goals  5. Hyponatremia/Hypokalemia: Etiology likely secondary to hypovolemic hyponatremia in the setting of poor oral intake and decreased appetite over the past week/month. Sodium 129 and potassium 2.6 on admission-improved with IVF and electrolyte replacement respectively.    6. HTN/ dyslipidemia. Home medsTelemisartan and hctz/ statin therapy on hold since admission.Blood pressure improved since admission, low normal without meds.  7.  Urinary retention:  indwelling Foley catheter if she has recurrence.  Needed straight cath x2.  Discussed with bedside nurse. On 5/19, patient had urinary retention requiring straight cath x2 draining 700 to 800 mL each time. Now on indwelling foley catheter as failed voiding trial.   8. Severe malnutrition, failure to thrive: in the setting of advanced malignancy. Continue protein supplements, Boost.  Hospice home    Discharge Exam:  Vitals:   08/06/19 0105 08/06/19 0559  BP: (!) 100/56 124/63  Pulse: 74 77  Resp:  16  Temp: 98.3 F (36.8 C) 97.8 F (36.6 C)  SpO2: 93% 95%   Vitals:   08/05/19 2141 08/05/19 2258 08/06/19 0105  08/06/19 0559  BP: (!) 90/46 (!) 98/51 (!) 100/56 124/63  Pulse: 72 76 74 77  Resp: 17   16  Temp: 98.7 F (37.1 C)  98.3 F (36.8 C) 97.8 F (36.6 C)  TempSrc: Oral  Oral Oral  SpO2: 91% 92% 93% 95%  Weight:      Height:        General: Pt is alert, awake, not in acute distress, jaundiced Cardiovascular: RRR, S1/S2 +, no rubs, no gallops Respiratory: CTA bilaterally, no wheezing, no rhonchi Abdominal: Soft, NT, ND, bowel sounds + Extremities: bilateral lower extremity weakness improving  Discharge Condition:Stable CODE STATUS: DNR Diet recommendation: heart healthy Recommendations for Outpatient Follow-up:  1. Follow up with Hospice MD 2. Follow up with consultants: GI as needed  3. Please obtain follow up labs including:   Home Health services upon discharge:  Equipment/Devices upon discharge: Indwelling foley   Discharge Instructions:  Discharge Instructions    Call MD for:  difficulty breathing, headache or visual disturbances   Complete by: As directed    Call MD for:  persistant dizziness or light-headedness   Complete by: As directed    Call MD for:  persistant nausea and vomiting   Complete by: As directed    Call MD for:  temperature >100.4   Complete by: As directed    Diet - low sodium heart healthy   Complete by: As directed    Increase activity slowly   Complete by: As directed      Allergies as of 08/06/2019      Reactions   Dermacinrx Surgical [place] Dermatitis   Dermabond caused contact dermatitis    Iohexol Hives   Patient broke out in hives, needs pre meds if given contrast in the future per Dr. Carlis Abbott // rls       Medication List    STOP taking these medications   anastrozole 1 MG tablet Commonly known as: ARIMIDEX   atorvastatin 80 MG tablet Commonly known as: LIPITOR   CITRACAL +D3 PO   diphenhydrAMINE 50 MG tablet Commonly known as: BENADRYL   Finacea 15 % cream Generic drug: Azelaic Acid   LUTEIN-ZEAXANTHIN PO    Micardis HCT 40-12.5 MG tablet Generic drug: telmisartan-hydrochlorothiazide   predniSONE 50 MG tablet Commonly known as: DELTASONE   Vitamin D3 50 MCG (2000 UT) Tabs   Zetia 10 MG tablet Generic drug: ezetimibe     TAKE these medications   famotidine 20 MG tablet Commonly known as: PEPCID Take 20 mg by mouth 2 (two) times daily. Morning and bedtime   midodrine 2.5 MG tablet Commonly known as: PROAMATINE Take 1 tablet (2.5 mg total) by mouth 3 (three) times daily with meals.   ondansetron 4 MG tablet Commonly known as: ZOFRAN Take 1 tablet (4 mg total) by mouth every 6 (six) hours as needed for nausea.   oxyCODONE 5 MG immediate release tablet Commonly known as: Oxy IR/ROXICODONE Take 1 tablet (5 mg total) by mouth every 4 (four) hours as needed for moderate pain.   senna-docusate 8.6-50 MG tablet Commonly known as: Senokot-S Take 1 tablet by mouth at bedtime as needed for mild constipation.   senna-docusate 8.6-50 MG tablet Commonly known as: Senokot-S Take 1 tablet by mouth 2 (two) times daily.   simethicone 80 MG chewable tablet Commonly known as: MYLICON Chew 1 tablet (80 mg total) by mouth every 6 (six) hours as needed for flatulence.       Allergies  Allergen Reactions  . Dermacinrx Surgical Pharm[place] Dermatitis    Dermabond caused contact dermatitis   . Iohexol Hives  Patient broke out in hives, needs pre meds if given contrast in the future per Dr. Carlis Abbott // rls       The results of significant diagnostics from this hospitalization (including imaging, microbiology, ancillary and laboratory) are listed below for reference.    Labs: BNP (last 3 results) No results for input(s): BNP in the last 8760 hours. Basic Metabolic Panel: Recent Labs  Lab 07/31/19 0305 07/31/19 0305 08/01/19 0336 08/02/19 0249 08/03/19 0517 08/04/19 0849 08/05/19 0939  NA 131*   < > 133* 130* 131* 132* 135  K 3.5   < > 4.0 3.7 4.6 4.0 4.0  CL 103   < > 102 100 101  100 103  CO2 18*   < > 21* 19* 17* 20* 18*  GLUCOSE 113*   < > 96 110* 123* 88 143*  BUN 89*   < > 99* 99* 90* 78* 111*  CREATININE 1.48*   < > 1.77* 1.66* 2.18* 2.01* 1.79*  CALCIUM 7.4*   < > 7.8* 7.8* 8.0* 7.8* 8.0*  MG 2.5*  --   --   --   --   --   --    < > = values in this interval not displayed.   Liver Function Tests: Recent Labs  Lab 07/31/19 0305 08/01/19 0336 08/02/19 0249 08/04/19 0849 08/05/19 0939  AST 455* 414* 387* 246* 225*  ALT 246* 239* 232* 183* 171*  ALKPHOS 616* 627* 664* 580* 526*  BILITOT 11.1* 11.3* 11.3* 10.7* 13.1*  PROT 4.3* 4.6* 4.6* 4.7* 5.3*  ALBUMIN 1.3* 1.3* 1.3* 1.3* 1.9*   No results for input(s): LIPASE, AMYLASE in the last 168 hours. Recent Labs  Lab 08/01/19 0336  AMMONIA 40*   CBC: Recent Labs  Lab 07/30/19 1509 08/01/19 0336 08/02/19 0249 08/04/19 0849  WBC 20.7* 17.8* 17.1* 14.5*  NEUTROABS 18.6* 15.9* 15.2*  --   HGB 10.8* 9.8* 9.7* 9.7*  HCT 31.5* 28.7* 28.5* 28.2*  MCV 93.2 93.8 94.1 94.6  PLT 214 220 219 248   Cardiac Enzymes: Recent Labs  Lab 07/31/19 0305 08/01/19 0336  CKTOTAL 1,487* 781*   BNP: Invalid input(s): POCBNP CBG: No results for input(s): GLUCAP in the last 168 hours. D-Dimer No results for input(s): DDIMER in the last 72 hours. Hgb A1c No results for input(s): HGBA1C in the last 72 hours. Lipid Profile No results for input(s): CHOL, HDL, LDLCALC, TRIG, CHOLHDL, LDLDIRECT in the last 72 hours. Thyroid function studies No results for input(s): TSH, T4TOTAL, T3FREE, THYROIDAB in the last 72 hours.  Invalid input(s): FREET3 Anemia work up Recent Labs    08/03/19 1534  VITAMINB12 4,286*  FOLATE 9.0   Urinalysis    Component Value Date/Time   COLORURINE AMBER (A) 07/30/2019 1446   APPEARANCEUR HAZY (A) 07/30/2019 1446   LABSPEC 1.012 07/30/2019 1446   PHURINE 5.0 07/30/2019 1446   GLUCOSEU NEGATIVE 07/30/2019 1446   HGBUR LARGE (A) 07/30/2019 1446   BILIRUBINUR SMALL (A) 07/30/2019  1446   KETONESUR NEGATIVE 07/30/2019 1446   PROTEINUR NEGATIVE 07/30/2019 1446   NITRITE NEGATIVE 07/30/2019 1446   LEUKOCYTESUR NEGATIVE 07/30/2019 1446   Sepsis Labs Invalid input(s): PROCALCITONIN,  WBC,  LACTICIDVEN Microbiology Recent Results (from the past 240 hour(s))  Culture, Urine     Status: None   Collection Time: 07/30/19  2:47 PM   Specimen: Urine, Clean Catch  Result Value Ref Range Status   Specimen Description   Final    URINE, CLEAN CATCH Performed at  [facility], [place] [address] [place], [place] [address]    Special Requests   Final    NONE Performed at [facility], [place] [address], [place], [place] [address]    Culture   Final    NO GROWTH Performed at [facility], [place] [address], [place], [place] [address]    Report Status 08/01/2019 FINAL  Final  Culture, blood (routine x 2)     Status: None   Collection Time: 07/30/19  3:09 PM   Specimen: BLOOD  Result Value Ref Range Status   Specimen Description   Final    BLOOD LEFT ANTECUBITAL Performed at [place] [address] [place], [place] [address]    Special Requests   Final    BOTTLES DRAWN AEROBIC ONLY Blood Culture adequate volume Performed at [place] [address], [place], [place] [address]    Culture   Final    NO GROWTH 5 DAYS Performed at [facility], [place] [address] [place], [place] [address]    Report Status 08/04/2019 FINAL  Final  Culture, blood (routine x 2)     Status: None   Collection Time: 07/30/19  3:09 PM   Specimen: BLOOD  Result Value Ref Range Status   Specimen Description   Final    BLOOD BLOOD LEFT HAND Performed at [place] [address], [place], [place] [address]    Special Requests   Final    BOTTLES DRAWN AEROBIC ONLY Blood Culture adequate volume   Culture   Final    NO GROWTH 5 DAYS Performed at [facility], [place] [address] [place], [place] [address]    Report Status 08/04/2019 FINAL  Final  MRSA PCR Screening     Status: None   Collection Time: 08/04/19  5:18 PM   Specimen: Nasal Mucosa; Nasopharyngeal  Result Value Ref Range Status   MRSA by PCR NEGATIVE NEGATIVE Final    Comment:        The GeneXpert MRSA Assay (FDA approved for NASAL specimens only), is one component of a comprehensive MRSA colonization surveillance program. It is not intended to diagnose MRSA infection nor to guide or monitor treatment for MRSA infections. Performed at [facility], [place] [address], [place], [place] [address]     Procedures/Studies: CT HEAD WO CONTRAST  Result Date: 07/25/2019 CLINICAL DATA:  Left leg weakness. Focal neurologic deficit greater than 6 hours. EXAM: CT HEAD WITHOUT CONTRAST TECHNIQUE: Contiguous axial images were obtained from the base of the skull through the vertex without intravenous contrast. COMPARISON:  Overlapping portions of CT neck from 07/05/2019 FINDINGS: Brain: Small remote lacunar infarct or focus of chronic ischemic microvascular white matter disease along the anterior limb of the left internal capsule on image 14/2. Periventricular white matter and corona radiata hypodensities favor chronic ischemic microvascular white matter disease. Otherwise, the brainstem, cerebellum, cerebral peduncles, thalamus, basal ganglia, basilar cisterns, and ventricular system appear within normal limits. No intracranial hemorrhage, mass lesion, or acute CVA. Vascular: There is atherosclerotic calcification of the cavernous carotid arteries bilaterally. Skull: Unremarkable Sinuses/Orbits: Unremarkable Other: No supplemental non-categorized findings. IMPRESSION: 1. No acute intracranial findings. 2. Periventricular white matter and corona radiata hypodensities favor chronic ischemic microvascular white matter disease. 3. Small remote lacunar infarct or focus of chronic ischemic microvascular white  matter disease along the anterior limb of the left internal capsule. 4. Atherosclerosis. Electronically Signed  By: Van Clines M.D.   On: 07/25/2019 13:48   MR THORACIC SPINE WO CONTRAST  Result Date: 07/27/2019 CLINICAL DATA:  Weakness and inability to walk in a patient with breast cancer and metastatic disease to the liver. EXAM: MRI THORACIC SPINE WITHOUT CONTRAST TECHNIQUE: Multiplanar, multisequence MR imaging of the thoracic spine was performed. No intravenous contrast was administered. COMPARISON:  None. FINDINGS: Alignment:  Normal. Vertebrae: No fracture, evidence of discitis, or bone lesion. Cord:  Normal signal and morphology. Paraspinal and other soft tissues: There are small bilateral pleural effusions, greater on the right. Metastatic deposits in the liver are partially imaged. Disc levels: No disc bulge or protrusion at any level. The central canal and foramina are widely patent throughout. IMPRESSION: Negative for metastatic disease or central canal stenosis. No finding to explain the patient's symptoms. Small bilateral pleural effusions, greater on the right. Metastatic disease in the liver as seen on prior study is noted. Electronically Signed   By: Inge Rise M.D.   On: 07/27/2019 20:41   MR LUMBAR SPINE WO CONTRAST  Result Date: 07/27/2019 CLINICAL DATA:  Weakness and inability to walk in a patient with breast cancer and metastatic disease to the liver. EXAM: MRI LUMBAR SPINE WITHOUT CONTRAST TECHNIQUE: Multiplanar, multisequence MR imaging of the lumbar spine was performed. No intravenous contrast was administered. COMPARISON:  None. FINDINGS: Segmentation:  Standard. Alignment:  Normal. Vertebrae:  No fracture, evidence of discitis, or bone lesion. Conus medullaris and cauda equina: Conus extends to the L1 level. Conus and cauda equina appear normal. Paraspinal and other soft tissues: Negative. Disc levels: T12-L1: Negative. L1-2: Negative. L2-3: Negative. L3-4: There is  a shallow disc bulge without stenosis. L4-5: Moderate to advanced bilateral facet degenerative disease and a shallow disc bulge to the left. Mild central canal stenosis is present. Foramina open. L5-S1: Moderate to advanced facet degenerative change is worse on the left. Shallow disc bulge without stenosis. IMPRESSION: Negative for metastatic disease. No central canal stenosis or nerve root impingement to explain the patient's symptoms. 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 Electronically Signed   By: Inge Rise M.D.   On: 07/27/2019 18:00   US RENAL  Result Date: 07/26/2019 CLINICAL DATA:  74 year old female with acute renal insufficiency. Recent biliary stent placement. EXAM: RENAL / URINARY TRACT ULTRASOUND COMPLETE COMPARISON:  Abdomen MRI 07/20/2019. FINDINGS: Right Kidney: Renal measurements: 11.8 x 4.9 x 5.5 cm = volume: 166 mL. Echogenic right renal cortex compared to liver (image 5). But no right hydronephrosis or right renal lesion. Left Kidney: Renal measurements: 13.8 x 6.3 x 5.3 cm = volume: 240 mL. The left renal cortex also appears partially echogenic (image 27). No left hydronephrosis or left renal lesion. Bladder: Fairly decompressed with a Foley catheter balloon visible on image 44. Other: Bilateral pleural fluid is evident. A small volume of ascites is visible in the right abdomen. IMPRESSION: No acute renal findings.  Evidence of chronic medical renal disease. Electronically Signed   By: Genevie Ann M.D.   On: 07/26/2019 14:58   MR 3D Recon At Scanner  Result Date: 07/21/2019 CLINICAL DATA:  Jaundice, biliary obstruction. Liver lesion biopsy. History of breast and parotid cancer. EXAM: MRI ABDOMEN WITHOUT AND WITH CONTRAST (INCLUDING MRCP) TECHNIQUE: Multiplanar multisequence MR imaging of the abdomen was performed both before and after the administration of intravenous contrast. Heavily T2-weighted images of the biliary and pancreatic ducts  were obtained, and three-dimensional MRCP  images were rendered by post processing. CONTRAST:  7.28m GADAVIST GADOBUTROL 1 MMOL/ML IV SOLN COMPARISON:  CT chest 07/05/2019. CT abdomen  03/27/2017 FINDINGS: Despite efforts by the technologist and patient, motion artifact is present on today's exam and could not be eliminated. This reduces exam sensitivity and specificity. Lower chest: Trace right pleural effusion. Hepatobiliary: Widespread metastatic lesions throughout the liver with associated restriction of diffusion. An index T1 hypointense lesion centrally in the right hepatic lobe measures 4.0 by 3.0 cm on image 30/8. All segments of the liver have metastatic lesions. Diffuse gallbladder wall thickening. Intrahepatic biliary dilatation is observed with central truncation of the biliary tree. Given the bulk of tumor concentrated in this vicinity, the likelihood of central obstructing mass as a cause is high. The appearance could be from confluent metastatic lesions exerting extrinsic effect on the biliary tree, or a central primary hepatobiliary malignancy such as cholangiocarcinoma. The extrahepatic biliary tree distal to the central obstruction is not dilated. Portal vein remains patent. Pancreas:  Unremarkable Spleen:  Unremarkable Adrenals/Urinary Tract: Small nonenhancing right renal lesions favor cysts. Adrenal glands normal. Stomach/Bowel: On coronal images there is a sense of fullness in the ascending colon, possibly due to contraction although tumor is difficult to totally exclude given the appearance. Vascular/Lymphatic: Questionable adenopathy along the porta hepatis. Aortoiliac atherosclerotic vascular disease. No portal vein thrombosis identified. Other:  Upper abdominal ascites. Musculoskeletal: Unremarkable IMPRESSION: 1. Widespread metastatic disease throughout the liver. Dominant index lesion is in the right hepatic lobe measuring 4.0 by 3.0 cm. 2. Intrahepatic biliary dilatation with central  truncation of the biliary tree. The appearance could be due to extrinsic effect on the biliary tree or a central primary hepatobiliary malignancy such as cholangiocarcinoma. 3. Questionable adenopathy along the porta hepatis. 4. Apparent fullness in the ascending colon, possibly due to contraction although tumor is difficult to totally exclude given the appearance. 5. Trace right pleural effusion.  Upper abdominal ascites. 6. Despite efforts by the technologist and patient, motion artifact is present on today's exam and could not be eliminated. This reduces exam sensitivity and specificity. Electronically Signed   By: WVan ClinesM.D.   On: 07/21/2019 07:28   DG ERCP BILIARY & PANCREATIC DUCTS  Result Date: 07/23/2019 CLINICAL DATA:  74year old female with a history biliary abnormality EXAM: ERCP TECHNIQUE: Multiple spot images obtained with the fluoroscopic device and submitted for interpretation post-procedure. FLUOROSCOPY TIME:  Fluoroscopy Time: 15 minutes 39 seconds COMPARISON:  MR 07/20/2019 FINDINGS: Limited intraoperative fluoroscopic spot images during ERCP. Initial image demonstrates endoscope projecting over the upper abdomen. Subsequently there is cannulation of the ampulla of the extra attic biliary system and retrograde infusion of contrast partially opacifying the extrahepatic biliary system. Deployment of a balloon catheter. Final image demonstrates parallel plastic biliary stents. IMPRESSION: Limited images of ERCP, with evidence of balloon angioplasty and parallel plastic biliary stenting. Please refer to the dictated operative report for full details of intraoperative findings and procedure. Electronically Signed   By: JCorrie MckusickD.O.   On: 07/23/2019 11:23   MR ABDOMEN WITH MRCP W CONTRAST  Result Date: 07/21/2019 CLINICAL DATA:  Jaundice, biliary obstruction. Liver lesion biopsy. History of breast and parotid cancer. EXAM: MRI ABDOMEN WITHOUT AND WITH CONTRAST (INCLUDING MRCP)  TECHNIQUE: Multiplanar multisequence MR imaging of the abdomen was performed both before and after the administration of intravenous contrast. Heavily T2-weighted images of the biliary and pancreatic ducts were obtained, and three-dimensional MRCP images were rendered by post processing. CONTRAST:  7.53mGADAVIST  GADOBUTROL 1 MMOL/ML IV SOLN COMPARISON:  CT chest 07/05/2019. CT abdomen  03/27/2017 FINDINGS: Despite efforts by the technologist and patient, motion artifact is present on today's exam and could not be eliminated. This reduces exam sensitivity and specificity. Lower chest: Trace right pleural effusion. Hepatobiliary: Widespread metastatic lesions throughout the liver with associated restriction of diffusion. An index T1 hypointense lesion centrally in the right hepatic lobe measures 4.0 by 3.0 cm on image 30/8. All segments of the liver have metastatic lesions. Diffuse gallbladder wall thickening. Intrahepatic biliary dilatation is observed with central truncation of the biliary tree. Given the bulk of tumor concentrated in this vicinity, the likelihood of central obstructing mass as a cause is high. The appearance could be from confluent metastatic lesions exerting extrinsic effect on the biliary tree, or a central primary hepatobiliary malignancy such as cholangiocarcinoma. The extrahepatic biliary tree distal to the central obstruction is not dilated. Portal vein remains patent. Pancreas:  Unremarkable Spleen:  Unremarkable Adrenals/Urinary Tract: Small nonenhancing right renal lesions favor cysts. Adrenal glands normal. Stomach/Bowel: On coronal images there is a sense of fullness in the ascending colon, possibly due to contraction although tumor is difficult to totally exclude given the appearance. Vascular/Lymphatic: Questionable adenopathy along the porta hepatis. Aortoiliac atherosclerotic vascular disease. No portal vein thrombosis identified. Other:  Upper abdominal ascites. Musculoskeletal:  Unremarkable IMPRESSION: 1. Widespread metastatic disease throughout the liver. Dominant index lesion is in the right hepatic lobe measuring 4.0 by 3.0 cm. 2. Intrahepatic biliary dilatation with central truncation of the biliary tree. The appearance could be due to extrinsic effect on the biliary tree or a central primary hepatobiliary malignancy such as cholangiocarcinoma. 3. Questionable adenopathy along the porta hepatis. 4. Apparent fullness in the ascending colon, possibly due to contraction although tumor is difficult to totally exclude given the appearance. 5. Trace right pleural effusion.  Upper abdominal ascites. 6. Despite efforts by the technologist and patient, motion artifact is present on today's exam and could not be eliminated. This reduces exam sensitivity and specificity. Electronically Signed   By: Van Clines M.D.   On: 07/21/2019 07:28   ECHOCARDIOGRAM COMPLETE  Result Date: 08/03/2019    ECHOCARDIOGRAM REPORT   Patient Name:   [name] Date of Exam: 08/03/2019 Medical Rec #:  [id number]     Height:       65.0 in Accession #:    [id number]    Weight:       207.9 lb Date of Birth:  05-22-1945     BSA:          2.011 m Patient Age:    74 years      BP:           98/48 mmHg Patient Gender: F             HR:           82 bpm. Exam Location:  Inpatient Procedure: 2D Echo, Cardiac Doppler, Color Doppler and Strain Analysis Indications:    Z51.11 Encounter for antineoplastic chemotheraphy  History:        Patient has no prior history of Echocardiogram examinations.                 Signs/Symptoms:Syncope; Risk Factors:Hypertension, Dyslipidemia                 and GERD.  Sonographer:    Tiffany Dance Referring Phys: [id number] [place]  1. Left ventricular ejection fraction, by estimation, is 65 to  70%. The left ventricle has normal function. The left ventricle has no regional wall motion abnormalities. There is mild left ventricular hypertrophy. Left ventricular diastolic  parameters are consistent with Grade I diastolic dysfunction (impaired relaxation).  2. Right ventricular systolic function is normal. The right ventricular size is normal. There is normal pulmonary artery systolic pressure.  3. The mitral valve is grossly normal. Trivial mitral valve regurgitation.  4. The aortic valve is tricuspid. Aortic valve regurgitation is not visualized.  5. The inferior vena cava is normal in size with greater than 50% respiratory variability, suggesting right atrial pressure of 3 mmHg. FINDINGS  Left Ventricle: Left ventricular ejection fraction, by estimation, is 65 to 70%. The left ventricle has normal function. The left ventricle has no regional wall motion abnormalities. Global longitudinal strain performed but not reported based on interpreter judgement due to suboptimal tracking. The left ventricular internal cavity size was normal in size. There is mild left ventricular hypertrophy. Left ventricular diastolic parameters are consistent with Grade I diastolic dysfunction (impaired relaxation). Indeterminate filling pressures. Right Ventricle: The right ventricular size is normal. No increase in right ventricular wall thickness. Right ventricular systolic function is normal. There is normal pulmonary artery systolic pressure. The tricuspid regurgitant velocity is 2.58 m/s, and  with an assumed right atrial pressure of 3 mmHg, the estimated right ventricular systolic pressure is 19.1 mmHg. Left Atrium: Left atrial size was normal in size. Right Atrium: Right atrial size was normal in size. Pericardium: There is no evidence of pericardial effusion. Mitral Valve: The mitral valve is grossly normal. Trivial mitral valve regurgitation. Tricuspid Valve: The tricuspid valve is grossly normal. Tricuspid valve regurgitation is mild. Aortic Valve: The aortic valve is tricuspid. Aortic valve regurgitation is not visualized. Pulmonic Valve: The pulmonic valve was grossly normal. Pulmonic valve  regurgitation is not visualized. Aorta: The aortic root and ascending aorta are structurally normal, with no evidence of dilitation. Venous: The inferior vena cava is normal in size with greater than 50% respiratory variability, suggesting right atrial pressure of 3 mmHg. IAS/Shunts: No atrial level shunt detected by color flow Doppler.  LEFT VENTRICLE PLAX 2D LVIDd:         4.00 cm  Diastology LVIDs:         2.30 cm  LV e' lateral:   12.05 cm/s LV PW:         1.20 cm  LV E/e' lateral: 7.1 LV IVS:        1.00 cm  LV e' medial:    8.75 cm/s LVOT diam:     2.10 cm  LV E/e' medial:  9.8 LV SV:         74 LV SV Index:   37 LVOT Area:     3.46 cm  RIGHT VENTRICLE             IVC RV Basal diam:  2.80 cm     IVC diam: 1.60 cm RV S prime:     18.00 cm/s TAPSE (M-mode): 2.2 cm LEFT ATRIUM             Index       RIGHT ATRIUM           Index LA diam:        4.30 cm 2.14 cm/m  RA Area:     12.00 cm LA Vol (A2C):   56.0 ml 27.85 ml/m RA Volume:   25.70 ml  12.78 ml/m LA Vol (A4C):   30.5 ml 15.17  ml/m LA Biplane Vol: 43.6 ml 21.68 ml/m  AORTIC VALVE LVOT Vmax:   108.00 cm/s LVOT Vmean:  68.600 cm/s LVOT VTI:    0.214 m  AORTA Ao Root diam: 3.20 cm Ao Asc diam:  2.80 cm MITRAL VALVE               TRICUSPID VALVE MV Area (PHT): 4.68 cm    TR Peak grad:   26.6 mmHg MV Decel Time: 162 msec    TR Vmax:        258.00 cm/s MV E velocity: 85.70 cm/s MV A velocity: 63.60 cm/s  SHUNTS MV E/A ratio:  1.35        Systemic VTI:  0.21 m                            Systemic Diam: 2.10 cm Lyman Bishop MD Electronically signed by Lyman Bishop MD Signature Date/Time: 08/03/2019/11:49:39 AM    Final    [place] CORE BIOPSY (LIVER)  Result Date: 07/20/2019 INDICATION: 74 year old female with a past history of both breast and carotid carcinoma. She now has extensive multifocal liver lesions and presents for ultrasound-guided core biopsy of the same. EXAM: ULTRASOUND BIOPSY CORE LIVER MEDICATIONS: None. ANESTHESIA/SEDATION: Moderate (conscious)  sedation was employed during this procedure. A total of Versed 2 mg and Fentanyl 100 mcg was administered intravenously. Moderate Sedation Time: 10 minutes. The patient's level of consciousness and vital signs were monitored continuously by radiology nursing throughout the procedure under my direct supervision. FLUOROSCOPY TIME:  None COMPLICATIONS: None immediate. PROCEDURE: Informed written consent was obtained from the patient after a thorough discussion of the procedural risks, benefits and alternatives. All questions were addressed. A timeout was performed prior to the initiation of the procedure. The liver was interrogated with ultrasound. There are numerous hypoechoic solid lesions scattered throughout the liver. Additionally, there is intrahepatic biliary duct dilatation involving both the right and left hepatic lobes. The common bile duct is mildly dilated at 8 mm. The gallbladder is contracted.  No stones are visualized. A suitable lesion was identified in segment 4 B of the right liver. A skin entry site was selected and marked. The overlying skin was sterilely prepped and draped in the standard fashion using chlorhexidine skin prep. Local anesthesia was attained by infiltration with 1% lidocaine. A small dermatotomy was made. Under real-time sonographic guidance, a 17 gauge introducer needle was advanced into the margin of the mass. Multiple 18 gauge core biopsies were then coaxially obtained using the Bio Pince automated biopsy device. Biopsy specimens were placed in formalin and delivered to pathology for further analysis. As the introducer needle was removed, the biopsy tract was embolized with a Gel-Foam slurry. Post biopsy ultrasound imaging demonstrates no evidence of immediate complication. IMPRESSION: Ultrasound-guided core biopsy of hepatic lesion. Incidentally noted, there is significant intra and extrahepatic biliary ductal dilatation which is new/progressed compared to the incomplete CT  imaging from July 05, 2019. PLAN: Laboratory and imaging findings suggesting malignant obstructed jaundice and associated hyperbilirubinemia were discussed with the patient's radiation oncologist, Dr. Isidore Moos. [facility] [place] office is ranging to have the patient direct admitted to the hospital by the hospitalist service. Electronically Signed   By: Jacqulynn Cadet M.D.   On: 07/20/2019 15:24   VAS [place] LOWER EXTREMITY VENOUS (DVT)  Result Date: 08/01/2019  Lower Venous DVTStudy Indications: Pitting edema.  Comparison Study: none Performing Technologist: June Leap RDMS, RVT  Examination Guidelines: A complete  evaluation includes B-mode imaging, spectral Doppler, color Doppler, and power Doppler as needed of all accessible portions of each vessel. Bilateral testing is considered an integral part of a complete examination. Limited examinations for reoccurring indications may be performed as noted. The reflux portion of the exam is performed with the patient in reverse Trendelenburg.  +---------+---------------+---------+-----------+----------+--------------+ RIGHT    CompressibilityPhasicitySpontaneityPropertiesThrombus Aging +---------+---------------+---------+-----------+----------+--------------+ CFV      Full           Yes      Yes                                 +---------+---------------+---------+-----------+----------+--------------+ SFJ      Full                                                        +---------+---------------+---------+-----------+----------+--------------+ FV Prox  Full                                                        +---------+---------------+---------+-----------+----------+--------------+ FV Mid   Full                                                        +---------+---------------+---------+-----------+----------+--------------+ FV DistalFull                                                         +---------+---------------+---------+-----------+----------+--------------+ PFV      Full                                                        +---------+---------------+---------+-----------+----------+--------------+ POP      Full           Yes      Yes                                 +---------+---------------+---------+-----------+----------+--------------+ PTV      Full                                                        +---------+---------------+---------+-----------+----------+--------------+ PERO     Full                                                        +---------+---------------+---------+-----------+----------+--------------+   +---------+---------------+---------+-----------+----------+--------------+  LEFT     CompressibilityPhasicitySpontaneityPropertiesThrombus Aging +---------+---------------+---------+-----------+----------+--------------+ CFV      Full           Yes      Yes                                 +---------+---------------+---------+-----------+----------+--------------+ SFJ      Full                                                        +---------+---------------+---------+-----------+----------+--------------+ FV Prox  Full                                                        +---------+---------------+---------+-----------+----------+--------------+ FV Mid   Full                                                        +---------+---------------+---------+-----------+----------+--------------+ FV DistalFull                                                        +---------+---------------+---------+-----------+----------+--------------+ PFV      Full                                                        +---------+---------------+---------+-----------+----------+--------------+ POP      Full           Yes      Yes                                  +---------+---------------+---------+-----------+----------+--------------+ PTV      Full                                                        +---------+---------------+---------+-----------+----------+--------------+ PERO     Full                                                        +---------+---------------+---------+-----------+----------+--------------+     Summary: BILATERAL: - No evidence of deep vein thrombosis seen in the lower extremities, bilaterally. -No evidence of popliteal cyst, bilaterally.   *See table(s) above for measurements and observations. Electronically signed by Ruta Hinds MD on 08/01/2019 at 8:52:40 AM.  Final    US Abdomen Limited RUQ  Result Date: 07/21/2019 CLINICAL DATA:  Ascites EXAM: LIMITED ABDOMEN ULTRASOUND FOR ASCITES TECHNIQUE: Limited ultrasound survey for ascites was performed in all four abdominal quadrants. COMPARISON:  MRI 07/20/2019 FINDINGS: Trace abdominopelvic ascites with the largest fluid pocket noted in the pelvis near midline. Heterogeneous appearance of the visualized portion of the liver, better characterized on recent MRI. IMPRESSION: Trace abdominopelvic ascites. Electronically Signed   By: Davina Poke D.O.   On: 07/21/2019 12:31    Time coordinating discharge: Over 30 minutes  SIGNED:   Guilford Shi, MD  [facility] 08/06/2019, 12:49 PM

## 2019-08-11 ENCOUNTER — Ambulatory Visit: Payer: Medicare Other | Admitting: Hematology

## 2019-08-11 ENCOUNTER — Other Ambulatory Visit: Payer: Medicare Other

## 2019-08-13 ENCOUNTER — Ambulatory Visit: Payer: Medicare Other | Admitting: Hematology

## 2019-08-13 ENCOUNTER — Other Ambulatory Visit: Payer: Medicare Other

## 2019-08-17 DEATH — deceased

## 2020-01-05 ENCOUNTER — Ambulatory Visit: Payer: Self-pay | Admitting: Radiation Oncology

## 2021-09-02 IMAGING — CT CT NECK W/ CM
4 series · 16 of 33 positions shown, 19 images · IV contrast (omnipaque)
Comparison: 12/17/2018

CLINICAL DATA: Follow-up parotid cancer status post resection and
radiation therapy.

EXAM:
CT NECK WITH CONTRAST
TECHNIQUE: Multidetector CT imaging of the neck was performed using the
standard protocol following the bolus administration of intravenous
contrast.
CONTRAST:  100mL OMNIPAQUE IOHEXOL 300 MG/ML  SOLN

[Series 2: axial neck · axial · 0.39mm/px · z∈[-222,-90]mm · 5 of 100 slices shown, 7 images]
[im 17/100  soft-tissue]
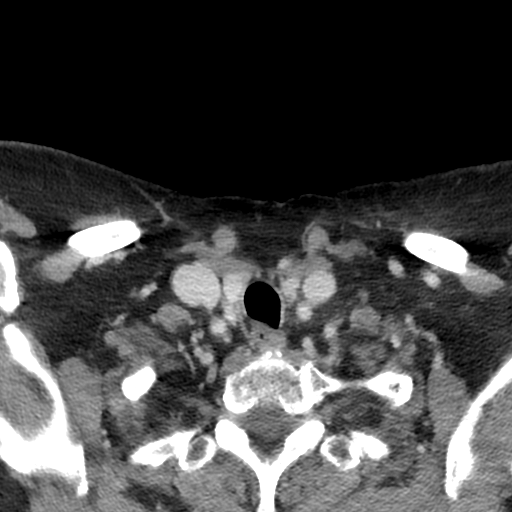
[im 17/100  bone]
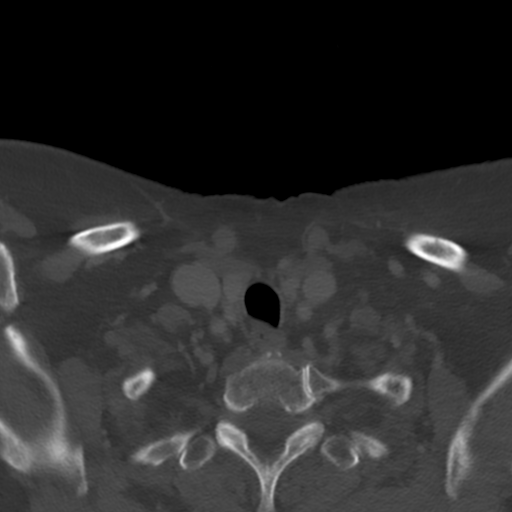
[im 34/100  bone]
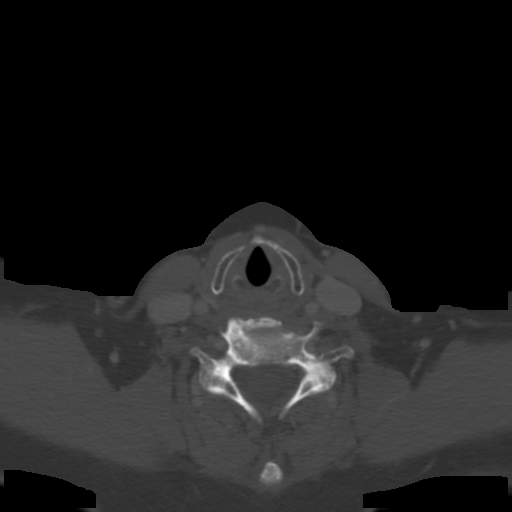
[im 50/100  bone]
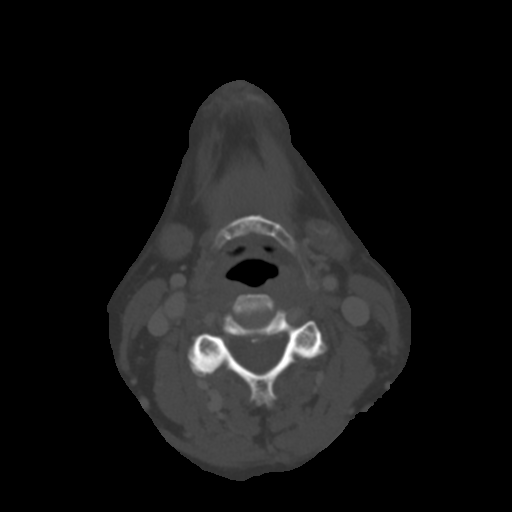
[im 67/100  bone]
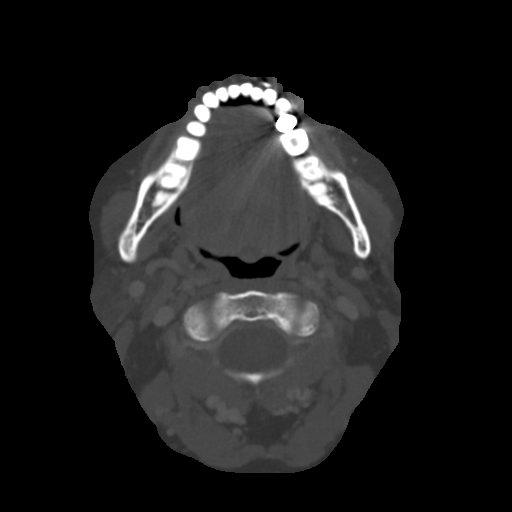
[im 83/100  soft-tissue]
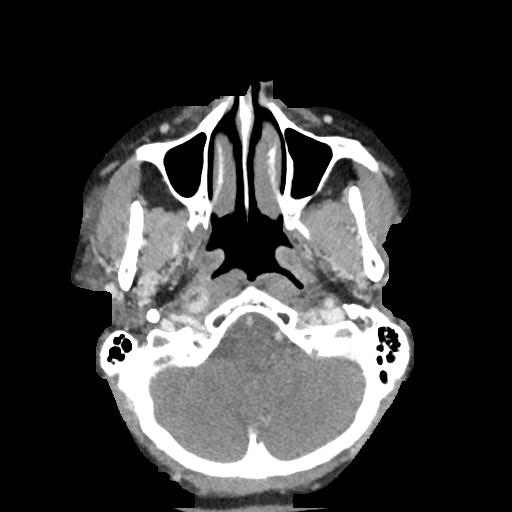
[im 83/100  bone]
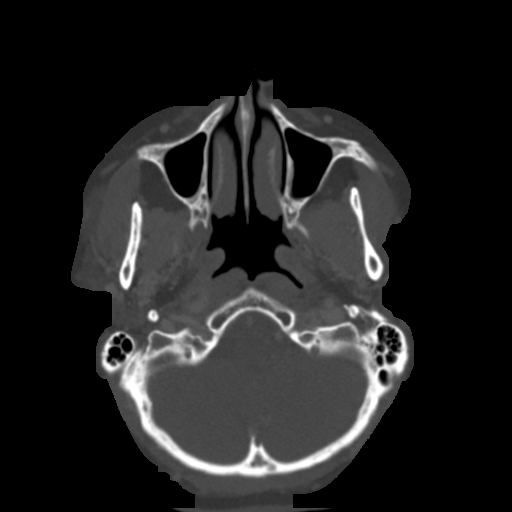

[Series 602: axial reformats · axial · 0.39mm/px · z∈[-268,-197]mm · 3 of 112 slices shown]
[im 19/112  bone]
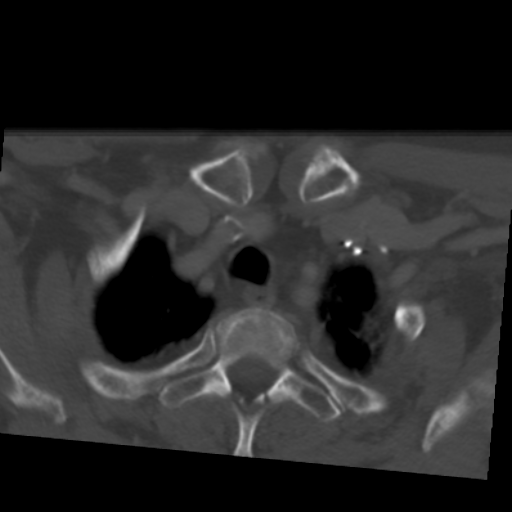
[im 38/112  bone]
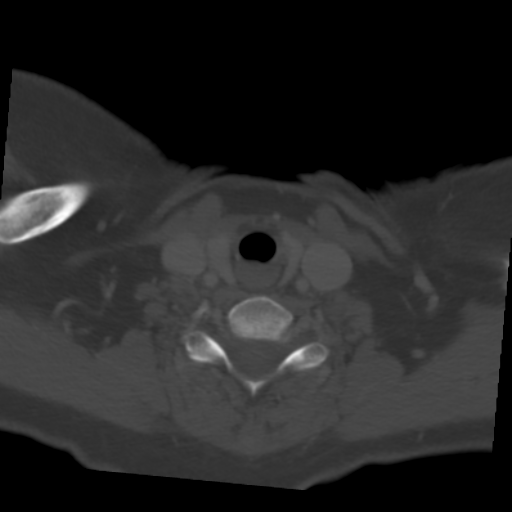
[im 56/112  bone]
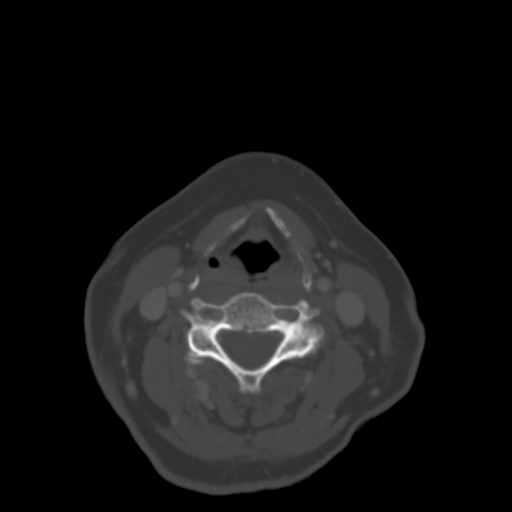

[Series 603: coronal · coronal · 0.39mm/px · 3 of 99 slices shown]
[im 27/99  bone]
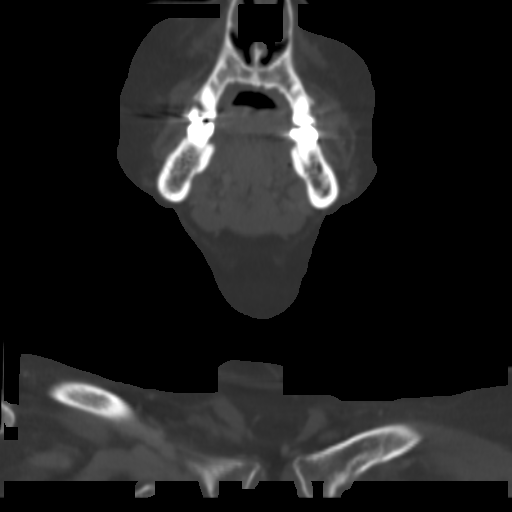
[im 42/99  bone]
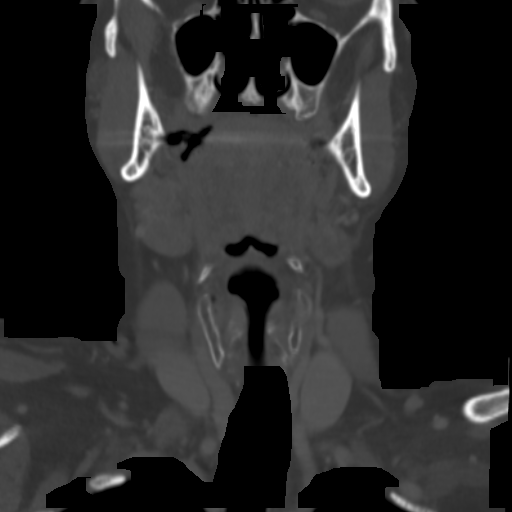
[im 57/99  bone]
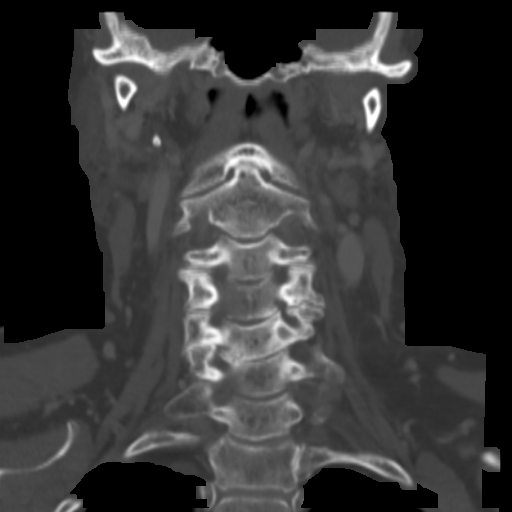

[Series 605: sagittal · sagittal · 0.39mm/px · 5 of 74 slices shown, 6 images]
[im 25/74  bone]
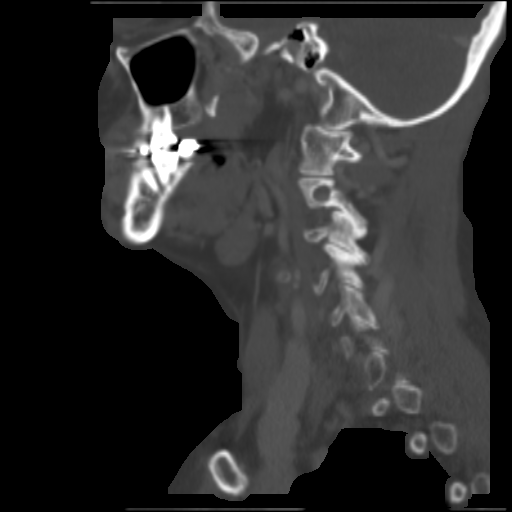
[im 31/74  bone]
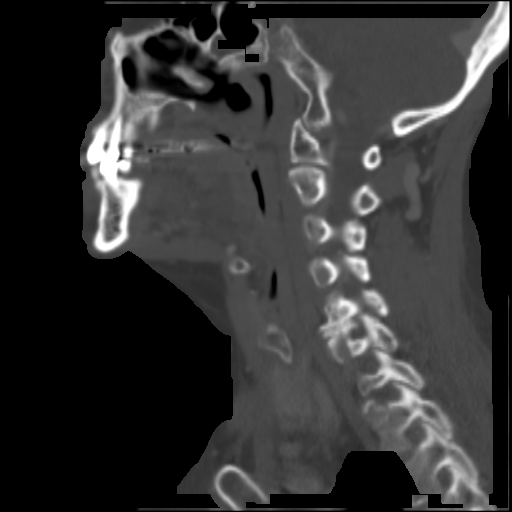
[im 37/74  soft-tissue]
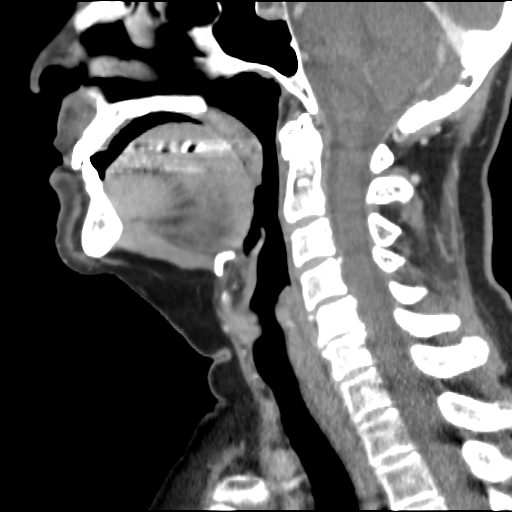
[im 37/74  bone]
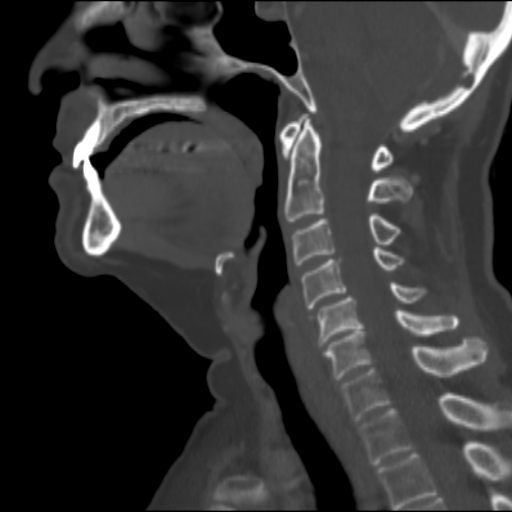
[im 43/74  bone]
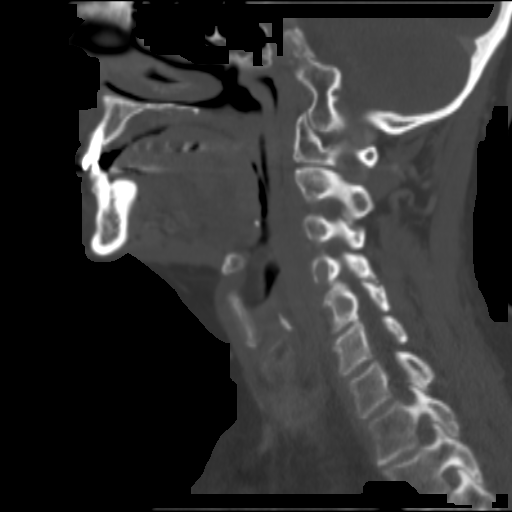
[im 49/74  bone]
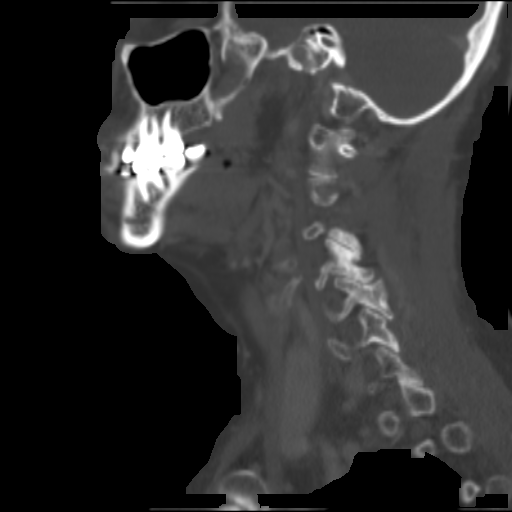

[16 of 33 positions shown; findings below may reference images not displayed]

FINDINGS: Pharynx and larynx: No evidence of mass or swelling.

Salivary glands: Status post left parotidectomy with decreased
postsurgical stranding and soft tissue thickening. No recurrent mass
in the parotidectomy bed. Unremarkable appearance of the right
parotid and right submandibular glands. Postradiation changes in the
left submandibular gland.

Thyroid: Unremarkable.

Lymph nodes: No enlarged or suspicious lymph nodes in the neck.

Vascular: Major vascular structures of the neck are patent with
small volume atherosclerotic plaque at the carotid bifurcation and
partially visualized bilateral intracranial carotid siphon
atherosclerosis.

Limited intracranial: Unremarkable.

Visualized orbits: Unremarkable.

Mastoids and visualized paranasal sinuses: Clear.

Skeleton: No suspicious osseous lesion. Severe left facet arthrosis
at C4-5 with unchanged grade 1 anterolisthesis. Disc degeneration
greatest at C5-6 where there is moderate right neural foraminal
stenosis due to uncovertebral spurring.

Upper chest: Reported separately.

Other: None.
IMPRESSION: No evidence of recurrent disease in the neck.

## 2021-10-08 ENCOUNTER — Other Ambulatory Visit: Payer: Self-pay
